# Patient Record
Sex: Male | Born: 1955 | Race: Black or African American | Hispanic: No | Marital: Single | Smoking: Former smoker
Health system: Southern US, Community
[De-identification: ages and names within clinical notes are randomized; demographics above are authoritative.]

## PROBLEM LIST (undated history)

## (undated) DIAGNOSIS — I10 Essential (primary) hypertension: Secondary | ICD-10-CM

## (undated) DIAGNOSIS — K219 Gastro-esophageal reflux disease without esophagitis: Secondary | ICD-10-CM

## (undated) DIAGNOSIS — I82409 Acute embolism and thrombosis of unspecified deep veins of unspecified lower extremity: Secondary | ICD-10-CM

## (undated) DIAGNOSIS — R6889 Other general symptoms and signs: Secondary | ICD-10-CM

## (undated) DIAGNOSIS — L0390 Cellulitis, unspecified: Secondary | ICD-10-CM

## (undated) DIAGNOSIS — I770 Arteriovenous fistula, acquired: Secondary | ICD-10-CM

## (undated) DIAGNOSIS — E785 Hyperlipidemia, unspecified: Secondary | ICD-10-CM

## (undated) DIAGNOSIS — I619 Nontraumatic intracerebral hemorrhage, unspecified: Secondary | ICD-10-CM

## (undated) DIAGNOSIS — N289 Disorder of kidney and ureter, unspecified: Secondary | ICD-10-CM

## (undated) DIAGNOSIS — Z940 Kidney transplant status: Secondary | ICD-10-CM

## (undated) DIAGNOSIS — G5600 Carpal tunnel syndrome, unspecified upper limb: Secondary | ICD-10-CM

## (undated) DIAGNOSIS — M109 Gout, unspecified: Secondary | ICD-10-CM

## (undated) DIAGNOSIS — F482 Pseudobulbar affect: Secondary | ICD-10-CM

## (undated) DIAGNOSIS — A419 Sepsis, unspecified organism: Secondary | ICD-10-CM

## (undated) DIAGNOSIS — I629 Nontraumatic intracranial hemorrhage, unspecified: Secondary | ICD-10-CM

## (undated) DIAGNOSIS — T801XXA Vascular complications following infusion, transfusion and therapeutic injection, initial encounter: Secondary | ICD-10-CM

## (undated) DIAGNOSIS — I739 Peripheral vascular disease, unspecified: Secondary | ICD-10-CM

## (undated) DIAGNOSIS — D649 Anemia, unspecified: Secondary | ICD-10-CM

## (undated) DIAGNOSIS — R7611 Nonspecific reaction to tuberculin skin test without active tuberculosis: Secondary | ICD-10-CM

## (undated) DIAGNOSIS — E875 Hyperkalemia: Secondary | ICD-10-CM

## (undated) DIAGNOSIS — K922 Gastrointestinal hemorrhage, unspecified: Secondary | ICD-10-CM

## (undated) DIAGNOSIS — E119 Type 2 diabetes mellitus without complications: Secondary | ICD-10-CM

## (undated) HISTORY — DX: Peripheral vascular disease, unspecified: I73.9

## (undated) HISTORY — DX: Type 2 diabetes mellitus without complications: E11.9

## (undated) HISTORY — DX: Gastrointestinal hemorrhage, unspecified: K92.2

## (undated) HISTORY — DX: Anemia, unspecified: D64.9

## (undated) HISTORY — PX: BRAIN BIOPSY: SHX905

## (undated) HISTORY — DX: Arteriovenous fistula, acquired: I77.0

## (undated) HISTORY — DX: Cellulitis, unspecified: L03.90

## (undated) HISTORY — DX: Vascular complications following infusion, transfusion and therapeutic injection, initial encounter: T80.1XXA

## (undated) HISTORY — DX: Disorder of kidney and ureter, unspecified: N28.9

## (undated) HISTORY — DX: Sepsis, unspecified organism: A41.9

## (undated) HISTORY — DX: Nontraumatic intracerebral hemorrhage, unspecified: I61.9

## (undated) HISTORY — PX: KIDNEY TRANSPLANT: SHX239

## (undated) HISTORY — DX: Nonspecific reaction to tuberculin skin test without active tuberculosis: R76.11

## (undated) HISTORY — DX: Other general symptoms and signs: R68.89

## (undated) HISTORY — DX: Acute embolism and thrombosis of unspecified deep veins of unspecified lower extremity: I82.409

## (undated) HISTORY — DX: Essential (primary) hypertension: I10

## (undated) HISTORY — PX: GASTROSTOMY: SHX151

## (undated) HISTORY — PX: OTHER SURGICAL HISTORY: SHX169

## (undated) HISTORY — PX: PERIPHERALLY INSERTED CENTRAL CATHETER INSERTION: SHX2221

## (undated) HISTORY — DX: Carpal tunnel syndrome, unspecified upper limb: G56.00

## (undated) HISTORY — DX: Nontraumatic intracranial hemorrhage, unspecified: I62.9

## (undated) HISTORY — PX: AMPUTATION: SHX166

## (undated) HISTORY — DX: Hyperkalemia: E87.5

## (undated) HISTORY — DX: Hyperlipidemia, unspecified: E78.5

## (undated) HISTORY — PX: AV FISTULA PLACEMENT: SHX1204

---

## 1998-01-30 ENCOUNTER — Ambulatory Visit (HOSPITAL_COMMUNITY): Admission: RE | Admit: 1998-01-30 | Discharge: 1998-01-30 | Payer: Self-pay | Admitting: Vascular Surgery

## 1998-01-31 ENCOUNTER — Inpatient Hospital Stay (HOSPITAL_COMMUNITY): Admission: EM | Admit: 1998-01-31 | Discharge: 1998-01-31 | Payer: Self-pay | Admitting: Emergency Medicine

## 1998-02-09 ENCOUNTER — Ambulatory Visit (HOSPITAL_COMMUNITY): Admission: RE | Admit: 1998-02-09 | Discharge: 1998-02-09 | Payer: Self-pay | Admitting: *Deleted

## 1998-05-13 ENCOUNTER — Ambulatory Visit (HOSPITAL_COMMUNITY): Admission: RE | Admit: 1998-05-13 | Discharge: 1998-05-13 | Payer: Self-pay | Admitting: *Deleted

## 1998-05-22 ENCOUNTER — Ambulatory Visit: Admission: RE | Admit: 1998-05-22 | Discharge: 1998-05-22 | Payer: Self-pay | Admitting: Vascular Surgery

## 1998-08-12 ENCOUNTER — Ambulatory Visit (HOSPITAL_BASED_OUTPATIENT_CLINIC_OR_DEPARTMENT_OTHER): Admission: RE | Admit: 1998-08-12 | Discharge: 1998-08-12 | Payer: Self-pay | Admitting: Orthopedic Surgery

## 1998-08-28 ENCOUNTER — Ambulatory Visit (HOSPITAL_COMMUNITY): Admission: RE | Admit: 1998-08-28 | Discharge: 1998-08-28 | Payer: Self-pay | Admitting: Nephrology

## 1998-08-28 ENCOUNTER — Encounter: Payer: Self-pay | Admitting: Nephrology

## 1998-11-12 ENCOUNTER — Encounter: Payer: Self-pay | Admitting: Nephrology

## 1998-11-12 ENCOUNTER — Ambulatory Visit (HOSPITAL_COMMUNITY): Admission: RE | Admit: 1998-11-12 | Discharge: 1998-11-12 | Payer: Self-pay | Admitting: Nephrology

## 1998-12-02 ENCOUNTER — Ambulatory Visit (HOSPITAL_COMMUNITY): Admission: RE | Admit: 1998-12-02 | Discharge: 1998-12-02 | Payer: Self-pay | Admitting: Cardiovascular Disease

## 1999-03-29 ENCOUNTER — Ambulatory Visit (HOSPITAL_COMMUNITY): Admission: RE | Admit: 1999-03-29 | Discharge: 1999-03-29 | Payer: Self-pay | Admitting: Infectious Diseases

## 1999-03-29 ENCOUNTER — Encounter: Payer: Self-pay | Admitting: Infectious Diseases

## 1999-05-09 ENCOUNTER — Inpatient Hospital Stay (HOSPITAL_COMMUNITY): Admission: EM | Admit: 1999-05-09 | Discharge: 1999-05-09 | Payer: Self-pay | Admitting: Nephrology

## 1999-05-27 ENCOUNTER — Encounter: Payer: Self-pay | Admitting: Vascular Surgery

## 1999-05-27 ENCOUNTER — Ambulatory Visit (HOSPITAL_COMMUNITY): Admission: RE | Admit: 1999-05-27 | Discharge: 1999-05-27 | Payer: Self-pay | Admitting: Vascular Surgery

## 1999-07-23 ENCOUNTER — Emergency Department (HOSPITAL_COMMUNITY): Admission: EM | Admit: 1999-07-23 | Discharge: 1999-07-23 | Payer: Self-pay | Admitting: Emergency Medicine

## 1999-07-23 ENCOUNTER — Encounter: Payer: Self-pay | Admitting: Emergency Medicine

## 1999-10-27 ENCOUNTER — Encounter: Admission: RE | Admit: 1999-10-27 | Discharge: 2000-01-25 | Payer: Self-pay | Admitting: Internal Medicine

## 2000-02-23 ENCOUNTER — Encounter: Admission: RE | Admit: 2000-02-23 | Discharge: 2000-05-23 | Payer: Self-pay | Admitting: Orthopedic Surgery

## 2000-05-29 ENCOUNTER — Encounter: Admission: RE | Admit: 2000-05-29 | Discharge: 2000-05-29 | Payer: Self-pay | Admitting: *Deleted

## 2000-05-29 ENCOUNTER — Encounter: Payer: Self-pay | Admitting: *Deleted

## 2001-08-12 ENCOUNTER — Encounter: Payer: Self-pay | Admitting: Nephrology

## 2001-08-12 ENCOUNTER — Inpatient Hospital Stay (HOSPITAL_COMMUNITY): Admission: EM | Admit: 2001-08-12 | Discharge: 2001-08-13 | Payer: Self-pay | Admitting: Emergency Medicine

## 2001-08-13 ENCOUNTER — Encounter: Payer: Self-pay | Admitting: Nephrology

## 2001-12-14 ENCOUNTER — Encounter: Payer: Self-pay | Admitting: Nephrology

## 2001-12-14 ENCOUNTER — Encounter: Admission: RE | Admit: 2001-12-14 | Discharge: 2001-12-14 | Payer: Self-pay | Admitting: Nephrology

## 2002-03-20 ENCOUNTER — Ambulatory Visit (HOSPITAL_COMMUNITY): Admission: RE | Admit: 2002-03-20 | Discharge: 2002-03-20 | Payer: Self-pay | Admitting: Nephrology

## 2002-03-20 ENCOUNTER — Encounter: Payer: Self-pay | Admitting: Nephrology

## 2003-02-12 ENCOUNTER — Encounter: Payer: Self-pay | Admitting: Nephrology

## 2003-02-12 ENCOUNTER — Ambulatory Visit (HOSPITAL_COMMUNITY): Admission: RE | Admit: 2003-02-12 | Discharge: 2003-02-12 | Payer: Self-pay | Admitting: Nephrology

## 2004-01-15 ENCOUNTER — Ambulatory Visit (HOSPITAL_COMMUNITY): Admission: RE | Admit: 2004-01-15 | Discharge: 2004-01-15 | Payer: Self-pay | Admitting: Vascular Surgery

## 2004-02-12 ENCOUNTER — Encounter (HOSPITAL_BASED_OUTPATIENT_CLINIC_OR_DEPARTMENT_OTHER): Admission: RE | Admit: 2004-02-12 | Discharge: 2004-05-12 | Payer: Self-pay | Admitting: Internal Medicine

## 2004-04-05 ENCOUNTER — Ambulatory Visit: Payer: Self-pay | Admitting: *Deleted

## 2004-04-16 ENCOUNTER — Ambulatory Visit (HOSPITAL_COMMUNITY): Admission: RE | Admit: 2004-04-16 | Discharge: 2004-04-16 | Payer: Self-pay | Admitting: Nephrology

## 2004-05-19 ENCOUNTER — Encounter (HOSPITAL_BASED_OUTPATIENT_CLINIC_OR_DEPARTMENT_OTHER): Admission: RE | Admit: 2004-05-19 | Discharge: 2004-08-09 | Payer: Self-pay | Admitting: Internal Medicine

## 2004-06-11 ENCOUNTER — Ambulatory Visit (HOSPITAL_COMMUNITY): Admission: RE | Admit: 2004-06-11 | Discharge: 2004-06-11 | Payer: Self-pay | Admitting: *Deleted

## 2004-06-23 ENCOUNTER — Ambulatory Visit (HOSPITAL_COMMUNITY): Admission: RE | Admit: 2004-06-23 | Discharge: 2004-06-23 | Payer: Self-pay | Admitting: *Deleted

## 2004-08-04 ENCOUNTER — Ambulatory Visit (HOSPITAL_COMMUNITY): Admission: RE | Admit: 2004-08-04 | Discharge: 2004-08-04 | Payer: Self-pay | Admitting: *Deleted

## 2004-08-24 ENCOUNTER — Encounter (HOSPITAL_BASED_OUTPATIENT_CLINIC_OR_DEPARTMENT_OTHER): Admission: RE | Admit: 2004-08-24 | Discharge: 2004-09-22 | Payer: Self-pay | Admitting: Internal Medicine

## 2004-09-06 ENCOUNTER — Ambulatory Visit (HOSPITAL_COMMUNITY): Admission: RE | Admit: 2004-09-06 | Discharge: 2004-09-06 | Payer: Self-pay | Admitting: Nephrology

## 2004-10-29 ENCOUNTER — Encounter (HOSPITAL_BASED_OUTPATIENT_CLINIC_OR_DEPARTMENT_OTHER): Admission: RE | Admit: 2004-10-29 | Discharge: 2005-01-27 | Payer: Self-pay | Admitting: Surgery

## 2005-01-19 ENCOUNTER — Ambulatory Visit (HOSPITAL_COMMUNITY): Admission: RE | Admit: 2005-01-19 | Discharge: 2005-01-19 | Payer: Self-pay | Admitting: Nephrology

## 2005-02-07 ENCOUNTER — Encounter (HOSPITAL_BASED_OUTPATIENT_CLINIC_OR_DEPARTMENT_OTHER): Admission: RE | Admit: 2005-02-07 | Discharge: 2005-05-08 | Payer: Self-pay | Admitting: Surgery

## 2005-05-20 ENCOUNTER — Encounter (HOSPITAL_BASED_OUTPATIENT_CLINIC_OR_DEPARTMENT_OTHER): Admission: RE | Admit: 2005-05-20 | Discharge: 2005-08-18 | Payer: Self-pay | Admitting: Surgery

## 2005-08-29 ENCOUNTER — Encounter (HOSPITAL_BASED_OUTPATIENT_CLINIC_OR_DEPARTMENT_OTHER): Admission: RE | Admit: 2005-08-29 | Discharge: 2005-09-01 | Payer: Self-pay | Admitting: Surgery

## 2005-09-05 ENCOUNTER — Ambulatory Visit: Payer: Self-pay | Admitting: Gastroenterology

## 2005-09-12 ENCOUNTER — Ambulatory Visit: Payer: Self-pay | Admitting: Gastroenterology

## 2005-09-16 ENCOUNTER — Ambulatory Visit (HOSPITAL_COMMUNITY): Admission: RE | Admit: 2005-09-16 | Discharge: 2005-09-16 | Payer: Self-pay | Admitting: Nephrology

## 2006-01-21 ENCOUNTER — Ambulatory Visit (HOSPITAL_COMMUNITY): Admission: RE | Admit: 2006-01-21 | Discharge: 2006-01-21 | Payer: Self-pay | Admitting: Vascular Surgery

## 2006-01-27 ENCOUNTER — Ambulatory Visit (HOSPITAL_COMMUNITY): Admission: RE | Admit: 2006-01-27 | Discharge: 2006-01-27 | Payer: Self-pay | Admitting: Vascular Surgery

## 2006-02-17 ENCOUNTER — Ambulatory Visit: Admission: RE | Admit: 2006-02-17 | Discharge: 2006-02-17 | Payer: Self-pay | Admitting: Vascular Surgery

## 2006-03-24 ENCOUNTER — Encounter (HOSPITAL_BASED_OUTPATIENT_CLINIC_OR_DEPARTMENT_OTHER): Admission: RE | Admit: 2006-03-24 | Discharge: 2006-06-22 | Payer: Self-pay | Admitting: Surgery

## 2006-03-27 ENCOUNTER — Ambulatory Visit (HOSPITAL_COMMUNITY): Admission: RE | Admit: 2006-03-27 | Discharge: 2006-03-27 | Payer: Self-pay | Admitting: Vascular Surgery

## 2006-04-03 ENCOUNTER — Encounter (HOSPITAL_BASED_OUTPATIENT_CLINIC_OR_DEPARTMENT_OTHER): Admission: RE | Admit: 2006-04-03 | Discharge: 2006-06-22 | Payer: Self-pay | Admitting: Surgery

## 2006-04-26 ENCOUNTER — Ambulatory Visit (HOSPITAL_COMMUNITY): Admission: RE | Admit: 2006-04-26 | Discharge: 2006-04-26 | Payer: Self-pay | Admitting: Nephrology

## 2006-05-11 ENCOUNTER — Ambulatory Visit (HOSPITAL_COMMUNITY): Admission: RE | Admit: 2006-05-11 | Discharge: 2006-05-11 | Payer: Self-pay | Admitting: Vascular Surgery

## 2006-05-15 ENCOUNTER — Encounter: Admission: RE | Admit: 2006-05-15 | Discharge: 2006-05-15 | Payer: Self-pay | Admitting: Nephrology

## 2006-05-15 ENCOUNTER — Ambulatory Visit (HOSPITAL_COMMUNITY): Admission: RE | Admit: 2006-05-15 | Discharge: 2006-05-15 | Payer: Self-pay | Admitting: Nephrology

## 2006-05-16 ENCOUNTER — Ambulatory Visit (HOSPITAL_COMMUNITY): Admission: RE | Admit: 2006-05-16 | Discharge: 2006-05-16 | Payer: Self-pay | Admitting: Vascular Surgery

## 2006-06-05 ENCOUNTER — Ambulatory Visit (HOSPITAL_COMMUNITY): Admission: RE | Admit: 2006-06-05 | Discharge: 2006-06-05 | Payer: Self-pay | Admitting: Vascular Surgery

## 2006-06-23 ENCOUNTER — Inpatient Hospital Stay (HOSPITAL_COMMUNITY): Admission: EM | Admit: 2006-06-23 | Discharge: 2006-06-26 | Payer: Self-pay | Admitting: Emergency Medicine

## 2006-06-23 ENCOUNTER — Ambulatory Visit: Payer: Self-pay | Admitting: Cardiology

## 2006-07-05 ENCOUNTER — Ambulatory Visit (HOSPITAL_COMMUNITY): Admission: RE | Admit: 2006-07-05 | Discharge: 2006-07-05 | Payer: Self-pay | Admitting: Surgery

## 2006-07-05 ENCOUNTER — Encounter (HOSPITAL_BASED_OUTPATIENT_CLINIC_OR_DEPARTMENT_OTHER): Admission: RE | Admit: 2006-07-05 | Discharge: 2006-08-10 | Payer: Self-pay | Admitting: Surgery

## 2006-07-10 ENCOUNTER — Ambulatory Visit (HOSPITAL_COMMUNITY): Admission: RE | Admit: 2006-07-10 | Discharge: 2006-07-10 | Payer: Self-pay | Admitting: Vascular Surgery

## 2006-07-31 ENCOUNTER — Ambulatory Visit: Payer: Self-pay | Admitting: Gastroenterology

## 2006-08-14 ENCOUNTER — Ambulatory Visit: Payer: Self-pay | Admitting: Physician Assistant

## 2006-08-14 ENCOUNTER — Ambulatory Visit (HOSPITAL_COMMUNITY): Admission: RE | Admit: 2006-08-14 | Discharge: 2006-08-14 | Payer: Self-pay | Admitting: Vascular Surgery

## 2006-09-20 ENCOUNTER — Encounter: Payer: Self-pay | Admitting: Vascular Surgery

## 2006-09-20 ENCOUNTER — Ambulatory Visit (HOSPITAL_COMMUNITY): Admission: RE | Admit: 2006-09-20 | Discharge: 2006-09-20 | Payer: Self-pay | Admitting: Nephrology

## 2006-10-25 ENCOUNTER — Ambulatory Visit: Payer: Self-pay | Admitting: Vascular Surgery

## 2007-02-06 ENCOUNTER — Encounter (HOSPITAL_BASED_OUTPATIENT_CLINIC_OR_DEPARTMENT_OTHER): Admission: RE | Admit: 2007-02-06 | Discharge: 2007-03-27 | Payer: Self-pay | Admitting: Surgery

## 2007-05-01 ENCOUNTER — Encounter (HOSPITAL_BASED_OUTPATIENT_CLINIC_OR_DEPARTMENT_OTHER): Admission: RE | Admit: 2007-05-01 | Discharge: 2007-05-21 | Payer: Self-pay | Admitting: Surgery

## 2007-06-14 ENCOUNTER — Inpatient Hospital Stay (HOSPITAL_COMMUNITY): Admission: EM | Admit: 2007-06-14 | Discharge: 2007-06-15 | Payer: Self-pay | Admitting: Emergency Medicine

## 2007-06-14 ENCOUNTER — Ambulatory Visit: Payer: Self-pay | Admitting: Internal Medicine

## 2007-06-15 ENCOUNTER — Encounter (INDEPENDENT_AMBULATORY_CARE_PROVIDER_SITE_OTHER): Payer: Self-pay | Admitting: Internal Medicine

## 2007-08-21 ENCOUNTER — Ambulatory Visit: Payer: Self-pay | Admitting: Pulmonary Disease

## 2007-08-21 ENCOUNTER — Inpatient Hospital Stay (HOSPITAL_COMMUNITY): Admission: EM | Admit: 2007-08-21 | Discharge: 2007-09-06 | Payer: Self-pay | Admitting: Emergency Medicine

## 2007-08-22 ENCOUNTER — Encounter: Payer: Self-pay | Admitting: Neurosurgery

## 2007-08-27 ENCOUNTER — Ambulatory Visit: Payer: Self-pay | Admitting: Infectious Disease

## 2007-08-28 ENCOUNTER — Encounter: Payer: Self-pay | Admitting: Infectious Disease

## 2007-09-05 ENCOUNTER — Encounter: Payer: Self-pay | Admitting: Pulmonary Disease

## 2007-09-19 ENCOUNTER — Emergency Department (HOSPITAL_COMMUNITY): Admission: EM | Admit: 2007-09-19 | Discharge: 2007-09-19 | Payer: Self-pay | Admitting: Emergency Medicine

## 2007-10-09 ENCOUNTER — Emergency Department (HOSPITAL_COMMUNITY): Admission: EM | Admit: 2007-10-09 | Discharge: 2007-10-09 | Payer: Self-pay | Admitting: Emergency Medicine

## 2008-09-03 ENCOUNTER — Encounter (HOSPITAL_BASED_OUTPATIENT_CLINIC_OR_DEPARTMENT_OTHER): Payer: Self-pay | Admitting: Internal Medicine

## 2008-11-07 ENCOUNTER — Encounter: Admission: RE | Admit: 2008-11-07 | Discharge: 2008-11-07 | Payer: Self-pay | Admitting: Internal Medicine

## 2008-12-14 ENCOUNTER — Emergency Department (HOSPITAL_COMMUNITY): Admission: EM | Admit: 2008-12-14 | Discharge: 2008-12-15 | Payer: Self-pay | Admitting: Emergency Medicine

## 2009-06-11 ENCOUNTER — Ambulatory Visit (HOSPITAL_COMMUNITY): Admission: RE | Admit: 2009-06-11 | Discharge: 2009-06-11 | Payer: Self-pay | Admitting: Internal Medicine

## 2009-06-12 ENCOUNTER — Emergency Department (HOSPITAL_COMMUNITY): Admission: EM | Admit: 2009-06-12 | Discharge: 2009-06-12 | Payer: Self-pay | Admitting: Emergency Medicine

## 2010-02-13 ENCOUNTER — Emergency Department (HOSPITAL_COMMUNITY): Admission: EM | Admit: 2010-02-13 | Discharge: 2010-02-14 | Payer: Self-pay | Admitting: Emergency Medicine

## 2010-03-31 ENCOUNTER — Ambulatory Visit (HOSPITAL_COMMUNITY): Admission: RE | Admit: 2010-03-31 | Discharge: 2010-03-31 | Payer: Self-pay | Admitting: Orthopedic Surgery

## 2010-04-15 ENCOUNTER — Ambulatory Visit: Payer: Self-pay | Admitting: Cardiology

## 2010-04-15 ENCOUNTER — Inpatient Hospital Stay (HOSPITAL_COMMUNITY): Admission: EM | Admit: 2010-04-15 | Discharge: 2010-04-27 | Payer: Self-pay | Admitting: Emergency Medicine

## 2010-04-16 ENCOUNTER — Ambulatory Visit: Payer: Self-pay | Admitting: Vascular Surgery

## 2010-04-16 ENCOUNTER — Encounter (INDEPENDENT_AMBULATORY_CARE_PROVIDER_SITE_OTHER): Payer: Self-pay | Admitting: Internal Medicine

## 2010-04-24 ENCOUNTER — Encounter (INDEPENDENT_AMBULATORY_CARE_PROVIDER_SITE_OTHER): Payer: Self-pay | Admitting: Internal Medicine

## 2010-04-25 ENCOUNTER — Encounter (INDEPENDENT_AMBULATORY_CARE_PROVIDER_SITE_OTHER): Payer: Self-pay | Admitting: Internal Medicine

## 2010-04-28 ENCOUNTER — Encounter: Payer: Self-pay | Admitting: Cardiovascular Disease

## 2010-04-30 ENCOUNTER — Encounter: Payer: Self-pay | Admitting: Cardiovascular Disease

## 2010-05-03 DIAGNOSIS — I82409 Acute embolism and thrombosis of unspecified deep veins of unspecified lower extremity: Secondary | ICD-10-CM | POA: Insufficient documentation

## 2010-05-03 DIAGNOSIS — I629 Nontraumatic intracranial hemorrhage, unspecified: Secondary | ICD-10-CM

## 2010-05-03 DIAGNOSIS — Z8669 Personal history of other diseases of the nervous system and sense organs: Secondary | ICD-10-CM

## 2010-05-03 DIAGNOSIS — L0390 Cellulitis, unspecified: Secondary | ICD-10-CM

## 2010-05-03 DIAGNOSIS — I7789 Other specified disorders of arteries and arterioles: Secondary | ICD-10-CM

## 2010-05-03 DIAGNOSIS — E875 Hyperkalemia: Secondary | ICD-10-CM

## 2010-05-03 DIAGNOSIS — L0291 Cutaneous abscess, unspecified: Secondary | ICD-10-CM

## 2010-05-03 DIAGNOSIS — N289 Disorder of kidney and ureter, unspecified: Secondary | ICD-10-CM | POA: Insufficient documentation

## 2010-05-03 DIAGNOSIS — E119 Type 2 diabetes mellitus without complications: Secondary | ICD-10-CM

## 2010-05-03 DIAGNOSIS — I10 Essential (primary) hypertension: Secondary | ICD-10-CM

## 2010-05-03 DIAGNOSIS — K922 Gastrointestinal hemorrhage, unspecified: Secondary | ICD-10-CM | POA: Insufficient documentation

## 2010-05-03 DIAGNOSIS — Z8639 Personal history of other endocrine, nutritional and metabolic disease: Secondary | ICD-10-CM

## 2010-05-03 DIAGNOSIS — E785 Hyperlipidemia, unspecified: Secondary | ICD-10-CM

## 2010-05-03 DIAGNOSIS — K649 Unspecified hemorrhoids: Secondary | ICD-10-CM | POA: Insufficient documentation

## 2010-05-03 DIAGNOSIS — Z862 Personal history of diseases of the blood and blood-forming organs and certain disorders involving the immune mechanism: Secondary | ICD-10-CM | POA: Insufficient documentation

## 2010-05-03 DIAGNOSIS — A419 Sepsis, unspecified organism: Secondary | ICD-10-CM | POA: Insufficient documentation

## 2010-05-03 DIAGNOSIS — I959 Hypotension, unspecified: Secondary | ICD-10-CM | POA: Insufficient documentation

## 2010-05-03 DIAGNOSIS — I739 Peripheral vascular disease, unspecified: Secondary | ICD-10-CM

## 2010-05-03 DIAGNOSIS — D649 Anemia, unspecified: Secondary | ICD-10-CM | POA: Insufficient documentation

## 2010-05-28 ENCOUNTER — Encounter (INDEPENDENT_AMBULATORY_CARE_PROVIDER_SITE_OTHER): Payer: Self-pay | Admitting: *Deleted

## 2010-06-23 ENCOUNTER — Ambulatory Visit: Payer: Self-pay | Admitting: Cardiovascular Disease

## 2010-07-14 DIAGNOSIS — I43 Cardiomyopathy in diseases classified elsewhere: Secondary | ICD-10-CM | POA: Insufficient documentation

## 2010-07-24 ENCOUNTER — Encounter: Payer: Self-pay | Admitting: Nephrology

## 2010-07-25 ENCOUNTER — Encounter (HOSPITAL_BASED_OUTPATIENT_CLINIC_OR_DEPARTMENT_OTHER): Payer: Self-pay | Admitting: Internal Medicine

## 2010-07-25 ENCOUNTER — Encounter: Payer: Self-pay | Admitting: Nephrology

## 2010-08-03 NOTE — Letter (Signed)
Summary: Appointment - Missed  [facility], Main Office  [address]   [place], [place] [address]   Phone: [phone]  Fax: [phone]        May 28, 2010 MRN: [id number]      [facility] [address] [place], [place]  [address]    Dear Mr. Gwynn,  Our records indicate you missed your appointment on May 14, 2010 with Dr. Eden Emms.  It is very important that we reach you to reschedule this appointment. We look forward to participating in your health care needs. Please contact us at the number listed above at your earliest convenience to reschedule this appointment.     Sincerely,   Glass blower/designer

## 2010-08-03 NOTE — Letter (Signed)
Summary: [employer]   Imported By: Marylou Mccoy 05/13/2010 16:02:00  _____________________________________________________________________  External Attachment:    Type:   Image     Comment:   External Document

## 2010-08-05 NOTE — Assessment & Plan Note (Signed)
Summary: NP3/INFERIOR WALL HYPOKINESIS/MT  Medications Added AMLODIPINE BESYLATE 10 MG TABS (AMLODIPINE BESYLATE) Take one tablet by mouth daily TACROLIMUS 1 MG CAPS (TACROLIMUS) 3 tabs every 12 hrs alon with 0.5 ALLOPURINOL 100 MG TABS (ALLOPURINOL) 1 tab by mouth once daily PREDNISONE 5 MG TABS (PREDNISONE) 1 tab by mouth once daily LEVETIRACETAM 500 MG TABS (LEVETIRACETAM) 1 tab by mouth every 12 hrs OMEPRAZOLE 20 MG CPDR (OMEPRAZOLE) 1 tab by mouth once daily NOVOLIN N 100 UNIT/ML SUSP (INSULIN ISOPHANE HUMAN) 7 units three times a day with meals LANTUS 100 UNIT/ML SOLN (INSULIN GLARGINE) 22 units at bedtime * POLYETHYLENE as directed ASPIRIN 81 MG TBEC (ASPIRIN) Take one tablet by mouth daily HYDROCODONE-ACETAMINOPHEN 5-500 MG TABS (HYDROCODONE-ACETAMINOPHEN) as needed * DOCU 100MG  1 tab for constipation * PHOS- NAK mix 1 packet in water      Allergies Added: NKDA  History of Present Illness: 55 yo referred by primary at assisted living.  Previously seen by Dr Andee Lineman and Samule Ohm.  Recent hospitalization for sepsis with PICC line removal.  Had CNS hemorage in 2007 after procedure/biopsy.  Advanced dementia with left hemiparesis.  Only verbal response is "I'm fine"  Discussed with daughter she is concerned about swelling in LUE.  Notes from assisted living indicate ? CAD with EF 50% 10/11 on echo.  Reviewed and no discrete RWMA.  Has had history of DCM 8 plus years ago with EF as low as 30% Had issues with alcohol and drug abuse then.  No active SSCP, chest pain palpitations. Reviewed cath from 2007 and no CAD.  Sepsis seems to have cleared.  Elevated LFT's in hospital are resolving.  Has dependant edema in LUE from nonuse and dependancy.    He is not a candidate for agressive cardiac w/u.  Current Problems (verified): 1)  Hypertension  (ICD-401.9) 2)  Dyslipidemia  (ICD-272.4) 3)  Carpal Tunnel Syndrome, Hx of  (ICD-V12.49) 4)  Av Fistula  (ICD-447.8) 5)  Anemia  (ICD-285.9) 6)   Hemorrhoids  (ICD-455.6) 7)  Hyperparathyroidism, Hx of  (ICD-V12.2) 8)  Intracranial Hemorrhage  (ICD-432.9) 9)  Hypotension  (ICD-458.9) 10)  Hyperkalemia  (ICD-276.7) 11)  Kidney Disease  (ICD-593.9) 12)  Sepsis  (ICD-038.9) 13)  Gi Bleeding  (ICD-578.9) 14)  Dvt  (ICD-453.40) 15)  Pvd  (ICD-443.9) 16)  Dm  (ICD-250.00) 17)  Cellulitis  (ICD-682.9)  Current Medications (verified): 1)  Amlodipine Besylate 10 Mg Tabs (Amlodipine Besylate) .... Take One Tablet By Mouth Daily 2)  Tacrolimus 1 Mg Caps (Tacrolimus) .... 3 Tabs Every 12 Hrs Alon With 0.5 3)  Allopurinol 100 Mg Tabs (Allopurinol) .Marland Kitchen.. 1 Tab By Mouth Once Daily 4)  Prednisone 5 Mg Tabs (Prednisone) .Marland Kitchen.. 1 Tab By Mouth Once Daily 5)  Levetiracetam 500 Mg Tabs (Levetiracetam) .Marland Kitchen.. 1 Tab By Mouth Every 12 Hrs 6)  Omeprazole 20 Mg Cpdr (Omeprazole) .Marland Kitchen.. 1 Tab By Mouth Once Daily 7)  Novolin N 100 Unit/ml Susp (Insulin Isophane Human) .... 7 Units Three Times A Day With Meals 8)  Lantus 100 Unit/ml Soln (Insulin Glargine) .... 22 Units At Bedtime 9)  Polyethylene .... As Directed 10)  Aspirin 81 Mg Tbec (Aspirin) .... Take One Tablet By Mouth Daily 11)  Hydrocodone-Acetaminophen 5-500 Mg Tabs (Hydrocodone-Acetaminophen) .... As Needed 12)  Docu 100mg  .... 1 Tab For Constipation 13)  Phos- Nak .... Mix 1 Packet in Water  Allergies (verified): No Known Drug Allergies  Past History:  Past Medical History: Last updated: 05/03/2010 Ejection fraction of 60% with  mild anterior hypokinesis, minimal  coronary artery disease on catheterization in December of 2007.  HYPERTENSION DYSLIPIDEMIA CARPAL TUNNEL SYNDROME, HX OF  AV FISTULA ANEMIA  HEMORRHOIDS HYPERPARATHYROIDISM, HX OF  INTRACRANIAL HEMORRHAGE  HYPOTENSION HYPERKALEMIA KIDNEY DISEASE  SEPSIS  GI BLEEDING  DVT PVD DM CELLULITIS   Right upper extremity IV infiltration.   History of intracerebral bleed, status post brain biopsy and right residual hemiparesis.     treated for positive PPD  Past Surgical History: Last updated: 05/03/2010 History of renal transplant maintained on chronic immunosuppressive  therapy.   History  of right arm AV fistula placement  history of tonsillectomy  history of  PICC line placement  amputation  gastrostomy  renal transplant  IVC  filter  NOTE  - bilateral  5th  toe amputation  old grafts in R FA   brain biopsy  #6 shiley cuff-  less trache,   Family History: Last updated: 05/03/2010  Significant for mother having heart disease.   Social History: Last updated: 05/03/2010  The patient is disabled, presenting living at the   nursing home.  He is a full code.  He has 2 daughters.  Quit smoking   more than 10 years ago.  No recent alcohol abuse.  Has had a previous   history of cocaine abuse.   Review of Systems       Denies fever, malais, weight loss, blurry vision, decreased visual acuity, cough, sputum, SOB, hemoptysis, pleuritic pain, palpitaitons, heartburn, abdominal pain, melena, lower extremity edema, claudication, or rash.   Vital Signs:  Patient profile:   55 year old male Pulse rate:   88 / minute Resp:     14 per minute BP supine:   123 / 87  Physical Exam  General:  Affect appropriate Healthy:  appears stated age HEENT: normal Neck supple with no adenopathy JVP normal no bruits no thyromegaly previous tracheostomy scar Lungs clear with no wheezing and good diaphragmatic motion Heart:  S1/S2 no murmur,rub, gallop or click PMI normal Abdomen: benighn, BS positve, no tenderness, no AAA previous renal transplant no bruit.  No HSM or HJR Distal pulses intact with no bruits Plus one blateral edema Neuro  left hemiparesis with dementia Skin  with multiple failed grafts in LUE from diallysis mild edema in RUE with decreased rigth radial pulse    Impression & Recommendations:  Problem # 1:  HYPERTENSION (ICD-401.9) Well contorlled His updated medication list for this  problem includes:    Amlodipine Besylate 10 Mg Tabs (Amlodipine besylate) .Marland Kitchen... Take one tablet by mouth daily    Aspirin 81 Mg Tbec (Aspirin) .Marland Kitchen... Take one tablet by mouth daily  Problem # 2:  DYSLIPIDEMIA (ICD-272.4) With increased LFTs would not Rx with medicine  Problem # 3:  CARDIOMYOPATHY OTHER DISEASES CLASSIFIED ELSW (ICD-425.8) Presumed nonischemic and related to ETOH/Drugs in past.  Normal cors on cath 2007 EF improved to 50% 10/11 with no clincial CHF No need form further w/u  His updated medication list for this problem includes:    Amlodipine Besylate 10 Mg Tabs (Amlodipine besylate) .Marland Kitchen... Take one tablet by mouth daily    Aspirin 81 Mg Tbec (Aspirin) .Marland Kitchen... Take one tablet by mouth daily  Problem # 4:  INTRACRANIAL HEMORRHAGE (ICD-432.9) Continue assisted living PT/OT Seems to have no issues with diet.  Surveilance UA His updated medication list for this problem includes:    Aspirin 81 Mg Tbec (Aspirin) .Marland Kitchen... Take one tablet by mouth daily  Problem # 5:  KIDNEY DISEASE (ICD-593.9) Previous renal transplant Cr in hospital  Cr 1.2 F/U Dr Caryn Section  Patient Instructions: 1)  Your physician recommends that you schedule a follow-up appointment in: AS NEEDED 2)  Your physician recommends that you continue on your current medications as directed. Please refer to the Current Medication list given to you today.

## 2010-09-15 LAB — COMPREHENSIVE METABOLIC PANEL
ALT: 244 U/L — ABNORMAL HIGH (ref 0–53)
ALT: 424 U/L — ABNORMAL HIGH (ref 0–53)
ALT: 575 U/L — ABNORMAL HIGH (ref 0–53)
AST: 285 U/L — ABNORMAL HIGH (ref 0–37)
AST: 622 U/L — ABNORMAL HIGH (ref 0–37)
Albumin: 2.5 g/dL — ABNORMAL LOW (ref 3.5–5.2)
Albumin: 2.9 g/dL — ABNORMAL LOW (ref 3.5–5.2)
Alkaline Phosphatase: 104 U/L (ref 39–117)
Alkaline Phosphatase: 133 U/L — ABNORMAL HIGH (ref 39–117)
BUN: 15 mg/dL (ref 6–23)
BUN: 19 mg/dL (ref 6–23)
CO2: 27 mEq/L (ref 19–32)
CO2: 27 mEq/L (ref 19–32)
CO2: 28 mEq/L (ref 19–32)
Calcium: 9.6 mg/dL (ref 8.4–10.5)
Chloride: 105 mEq/L (ref 96–112)
Chloride: 105 mEq/L (ref 96–112)
Chloride: 107 mEq/L (ref 96–112)
Creatinine, Ser: 1.05 mg/dL (ref 0.4–1.5)
Creatinine, Ser: 1.26 mg/dL (ref 0.4–1.5)
GFR calc Af Amer: >60 mL/min (ref >60)
GFR calc Af Amer: >60 mL/min (ref >60)
GFR calc Af Amer: >60 mL/min (ref >60)
GFR calc non Af Amer: >60 mL/min (ref >60)
GFR calc non Af Amer: >60 mL/min (ref >60)
GFR calc non Af Amer: >60 mL/min (ref >60)
Glucose, Bld: 131 mg/dL — ABNORMAL HIGH (ref 70–99)
Glucose, Bld: 139 mg/dL — ABNORMAL HIGH (ref 70–99)
Potassium: 4.3 mEq/L (ref 3.5–5.1)
Potassium: 4.4 mEq/L (ref 3.5–5.1)
Sodium: 138 mEq/L (ref 135–145)
Sodium: 138 mEq/L (ref 135–145)
Total Bilirubin: 0.8 mg/dL (ref 0.3–1.2)
Total Bilirubin: 1.1 mg/dL (ref 0.3–1.2)
Total Bilirubin: 2.3 mg/dL — ABNORMAL HIGH (ref 0.3–1.2)
Total Protein: 6.8 g/dL (ref 6.0–8.3)
Total Protein: 7.1 g/dL (ref 6.0–8.3)

## 2010-09-15 LAB — GLUCOSE, CAPILLARY
Glucose-Capillary: 110 mg/dL — ABNORMAL HIGH (ref 70–99)
Glucose-Capillary: 116 mg/dL — ABNORMAL HIGH (ref 70–99)
Glucose-Capillary: 121 mg/dL — ABNORMAL HIGH (ref 70–99)
Glucose-Capillary: 125 mg/dL — ABNORMAL HIGH (ref 70–99)
Glucose-Capillary: 127 mg/dL — ABNORMAL HIGH (ref 70–99)
Glucose-Capillary: 130 mg/dL — ABNORMAL HIGH (ref 70–99)
Glucose-Capillary: 133 mg/dL — ABNORMAL HIGH (ref 70–99)
Glucose-Capillary: 139 mg/dL — ABNORMAL HIGH (ref 70–99)
Glucose-Capillary: 140 mg/dL — ABNORMAL HIGH (ref 70–99)
Glucose-Capillary: 141 mg/dL — ABNORMAL HIGH (ref 70–99)
Glucose-Capillary: 162 mg/dL — ABNORMAL HIGH (ref 70–99)
Glucose-Capillary: 166 mg/dL — ABNORMAL HIGH (ref 70–99)
Glucose-Capillary: 166 mg/dL — ABNORMAL HIGH (ref 70–99)
Glucose-Capillary: 185 mg/dL — ABNORMAL HIGH (ref 70–99)
Glucose-Capillary: 187 mg/dL — ABNORMAL HIGH (ref 70–99)
Glucose-Capillary: 199 mg/dL — ABNORMAL HIGH (ref 70–99)
Glucose-Capillary: 201 mg/dL — ABNORMAL HIGH (ref 70–99)
Glucose-Capillary: 202 mg/dL — ABNORMAL HIGH (ref 70–99)
Glucose-Capillary: 215 mg/dL — ABNORMAL HIGH (ref 70–99)
Glucose-Capillary: 223 mg/dL — ABNORMAL HIGH (ref 70–99)
Glucose-Capillary: 227 mg/dL — ABNORMAL HIGH (ref 70–99)
Glucose-Capillary: 235 mg/dL — ABNORMAL HIGH (ref 70–99)
Glucose-Capillary: 243 mg/dL — ABNORMAL HIGH (ref 70–99)
Glucose-Capillary: 249 mg/dL — ABNORMAL HIGH (ref 70–99)
Glucose-Capillary: 254 mg/dL — ABNORMAL HIGH (ref 70–99)
Glucose-Capillary: 258 mg/dL — ABNORMAL HIGH (ref 70–99)
Glucose-Capillary: 259 mg/dL — ABNORMAL HIGH (ref 70–99)
Glucose-Capillary: 269 mg/dL — ABNORMAL HIGH (ref 70–99)

## 2010-09-15 LAB — CBC
HCT: 44.9 % (ref 39.0–52.0)
HCT: 45.8 % (ref 39.0–52.0)
HCT: 46.1 % (ref 39.0–52.0)
Hemoglobin: 13.6 g/dL (ref 13.0–17.0)
Hemoglobin: 13.7 g/dL (ref 13.0–17.0)
Hemoglobin: 14.1 g/dL (ref 13.0–17.0)
MCH: 25.4 pg — ABNORMAL LOW (ref 26.0–34.0)
MCHC: 30.8 g/dL (ref 30.0–36.0)
MCHC: 30.8 g/dL (ref 30.0–36.0)
MCV: 82.3 fL (ref 78.0–100.0)
MCV: 83.4 fL (ref 78.0–100.0)
MCV: 84.9 fL (ref 78.0–100.0)
Platelets: 193 10*3/uL (ref 150–400)
Platelets: 210 10*3/uL (ref 150–400)
Platelets: 243 10*3/uL (ref 150–400)
Platelets: 261 10*3/uL (ref 150–400)
RBC: 5.25 MIL/uL (ref 4.22–5.81)
RBC: 5.29 MIL/uL (ref 4.22–5.81)
RBC: 5.35 MIL/uL (ref 4.22–5.81)
RBC: 5.39 MIL/uL (ref 4.22–5.81)
RBC: 5.52 MIL/uL (ref 4.22–5.81)
RDW: 15 % (ref 11.5–15.5)
RDW: 15.3 % (ref 11.5–15.5)
WBC: 3.4 10*3/uL — ABNORMAL LOW (ref 4.0–10.5)
WBC: 5.8 10*3/uL (ref 4.0–10.5)
WBC: 6.3 10*3/uL (ref 4.0–10.5)
WBC: 6.3 10*3/uL (ref 4.0–10.5)

## 2010-09-15 LAB — DIFFERENTIAL
Basophils Absolute: 0 10*3/uL (ref 0.0–0.1)
Basophils Relative: 0 % (ref 0–1)
Lymphocytes Relative: 17 % (ref 12–46)
Monocytes Absolute: 0.5 10*3/uL (ref 0.1–1.0)
Neutro Abs: 4.2 10*3/uL (ref 1.7–7.7)
Neutrophils Relative %: 72 % (ref 43–77)

## 2010-09-15 LAB — BASIC METABOLIC PANEL
BUN: 17 mg/dL (ref 6–23)
BUN: 9 mg/dL (ref 6–23)
CO2: 24 mEq/L (ref 19–32)
CO2: 27 mEq/L (ref 19–32)
Calcium: 9.9 mg/dL (ref 8.4–10.5)
Chloride: 103 mEq/L (ref 96–112)
Chloride: 104 mEq/L (ref 96–112)
Chloride: 106 mEq/L (ref 96–112)
Creatinine, Ser: 1.15 mg/dL (ref 0.4–1.5)
Creatinine, Ser: 1.17 mg/dL (ref 0.4–1.5)
GFR calc Af Amer: >60 mL/min (ref >60)
GFR calc Af Amer: >60 mL/min (ref >60)
GFR calc Af Amer: >60 mL/min (ref >60)
GFR calc non Af Amer: >60 mL/min (ref >60)
GFR calc non Af Amer: >60 mL/min (ref >60)
Glucose, Bld: 203 mg/dL — ABNORMAL HIGH (ref 70–99)
Glucose, Bld: 208 mg/dL — ABNORMAL HIGH (ref 70–99)
Potassium: 4 mEq/L (ref 3.5–5.1)
Potassium: 4 mEq/L (ref 3.5–5.1)
Potassium: 4.1 mEq/L (ref 3.5–5.1)
Sodium: 136 mEq/L (ref 135–145)
Sodium: 137 mEq/L (ref 135–145)

## 2010-09-15 LAB — LIPASE, BLOOD: Lipase: 37 U/L (ref 11–59)

## 2010-09-15 LAB — HEPATIC FUNCTION PANEL
ALT: 311 U/L — ABNORMAL HIGH (ref 0–53)
Alkaline Phosphatase: 136 U/L — ABNORMAL HIGH (ref 39–117)
Bilirubin, Direct: 0.3 mg/dL (ref 0.0–0.3)
Indirect Bilirubin: 0.5 mg/dL (ref 0.3–0.9)
Total Bilirubin: 0.8 mg/dL (ref 0.3–1.2)
Total Protein: 6.5 g/dL (ref 6.0–8.3)

## 2010-09-15 LAB — VANCOMYCIN, TROUGH: Vancomycin Tr: 22.4 ug/mL — ABNORMAL HIGH (ref 10.0–20.0)

## 2010-09-15 LAB — CK: Total CK: 127 U/L (ref 7–232)

## 2010-09-16 LAB — CULTURE, BLOOD (ROUTINE X 2)
Culture  Setup Time: 201110130143
Culture  Setup Time: 201110130519
Culture: NO GROWTH
Culture: NO GROWTH

## 2010-09-16 LAB — CBC
HCT: 47.9 % (ref 39.0–52.0)
HCT: 49.7 % (ref 39.0–52.0)
Hemoglobin: 15.8 g/dL (ref 13.0–17.0)
MCH: 26 pg (ref 26.0–34.0)
MCH: 26.8 pg (ref 26.0–34.0)
MCHC: 30.4 g/dL (ref 30.0–36.0)
MCHC: 30.9 g/dL (ref 30.0–36.0)
MCHC: 31.8 g/dL (ref 30.0–36.0)
MCV: 83.2 fL (ref 78.0–100.0)
MCV: 85.7 fL (ref 78.0–100.0)
Platelets: 134 10*3/uL — ABNORMAL LOW (ref 150–400)
Platelets: 86 10*3/uL — ABNORMAL LOW (ref 150–400)
RBC: 5.26 MIL/uL (ref 4.22–5.81)
RDW: 15 % (ref 11.5–15.5)
RDW: 15.1 % (ref 11.5–15.5)
RDW: 15.2 % (ref 11.5–15.5)
WBC: 8.3 10*3/uL (ref 4.0–10.5)

## 2010-09-16 LAB — CARDIAC PANEL(CRET KIN+CKTOT+MB+TROPI)
CK, MB: 2 ng/mL (ref 0.3–4.0)
Relative Index: 0.5 (ref 0.0–2.5)
Total CK: 196 U/L (ref 7–232)
Total CK: 396 U/L — ABNORMAL HIGH (ref 7–232)
Troponin I: 0.06 ng/mL (ref 0.00–0.06)
Troponin I: 0.1 ng/mL — ABNORMAL HIGH (ref 0.00–0.06)

## 2010-09-16 LAB — GLUCOSE, CAPILLARY
Glucose-Capillary: 133 mg/dL — ABNORMAL HIGH (ref 70–99)
Glucose-Capillary: 140 mg/dL — ABNORMAL HIGH (ref 70–99)
Glucose-Capillary: 149 mg/dL — ABNORMAL HIGH (ref 70–99)
Glucose-Capillary: 150 mg/dL — ABNORMAL HIGH (ref 70–99)
Glucose-Capillary: 165 mg/dL — ABNORMAL HIGH (ref 70–99)
Glucose-Capillary: 172 mg/dL — ABNORMAL HIGH (ref 70–99)
Glucose-Capillary: 272 mg/dL — ABNORMAL HIGH (ref 70–99)

## 2010-09-16 LAB — CK TOTAL AND CKMB (NOT AT ARMC)
CK, MB: 1.9 ng/mL (ref 0.3–4.0)
Total CK: 309 U/L — ABNORMAL HIGH (ref 7–232)

## 2010-09-16 LAB — URINALYSIS, ROUTINE W REFLEX MICROSCOPIC
Ketones, ur: 15 mg/dL — AB
Protein, ur: 100 mg/dL — AB
Urobilinogen, UA: 1 mg/dL (ref 0.0–1.0)

## 2010-09-16 LAB — COMPREHENSIVE METABOLIC PANEL
Albumin: 3 g/dL — ABNORMAL LOW (ref 3.5–5.2)
Alkaline Phosphatase: 70 U/L (ref 39–117)
BUN: 16 mg/dL (ref 6–23)
BUN: 17 mg/dL (ref 6–23)
CO2: 27 mEq/L (ref 19–32)
Calcium: 10 mg/dL (ref 8.4–10.5)
Calcium: 9.5 mg/dL (ref 8.4–10.5)
Creatinine, Ser: 1.46 mg/dL (ref 0.4–1.5)
GFR calc non Af Amer: 50 mL/min — ABNORMAL LOW (ref >60)
Glucose, Bld: 140 mg/dL — ABNORMAL HIGH (ref 70–99)
Glucose, Bld: 143 mg/dL — ABNORMAL HIGH (ref 70–99)
Potassium: 3.9 mEq/L (ref 3.5–5.1)
Sodium: 136 mEq/L (ref 135–145)
Total Protein: 6.9 g/dL (ref 6.0–8.3)
Total Protein: 7.1 g/dL (ref 6.0–8.3)

## 2010-09-16 LAB — DIFFERENTIAL
Basophils Relative: 0 % (ref 0–1)
Eosinophils Relative: 0 % (ref 0–5)
Lymphocytes Relative: 6 % — ABNORMAL LOW (ref 12–46)
Monocytes Absolute: 0.7 10*3/uL (ref 0.1–1.0)
Monocytes Relative: 7 % (ref 3–12)
Neutrophils Relative %: 87 % — ABNORMAL HIGH (ref 43–77)

## 2010-09-16 LAB — BASIC METABOLIC PANEL
Calcium: 9.2 mg/dL (ref 8.4–10.5)
Creatinine, Ser: 1.28 mg/dL (ref 0.4–1.5)
GFR calc Af Amer: >60 mL/min (ref >60)
GFR calc non Af Amer: 58 mL/min — ABNORMAL LOW (ref >60)
Sodium: 133 mEq/L — ABNORMAL LOW (ref 135–145)

## 2010-09-16 LAB — URINE CULTURE
Colony Count: NO GROWTH
Culture  Setup Time: 201110122229

## 2010-09-16 LAB — MAGNESIUM: Magnesium: 1.5 mg/dL (ref 1.5–2.5)

## 2010-09-16 LAB — HEMOCCULT GUIAC POC 1CARD (OFFICE): Fecal Occult Bld: NEGATIVE

## 2010-09-16 LAB — LIPASE, BLOOD: Lipase: 21 U/L (ref 11–59)

## 2010-09-16 LAB — URINE MICROSCOPIC-ADD ON

## 2010-09-16 LAB — PROLACTIN: Prolactin: 4.4 ng/mL (ref 2.1–17.1)

## 2010-10-05 LAB — DIFFERENTIAL
Basophils Relative: 0 % (ref 0–1)
Eosinophils Absolute: 0 10*3/uL (ref 0.0–0.7)
Lymphs Abs: 0.5 10*3/uL — ABNORMAL LOW (ref 0.7–4.0)
Neutro Abs: 3.2 10*3/uL (ref 1.7–7.7)
Neutrophils Relative %: 77 % (ref 43–77)

## 2010-10-05 LAB — CBC
MCHC: 31.2 g/dL (ref 30.0–36.0)
MCV: 83.2 fL (ref 78.0–100.0)
Platelets: 152 10*3/uL (ref 150–400)
WBC: 4.2 10*3/uL (ref 4.0–10.5)

## 2010-10-05 LAB — PROTIME-INR
INR: 1.06 (ref 0.00–1.49)
Prothrombin Time: 13.7 seconds (ref 11.6–15.2)

## 2010-10-05 LAB — APTT: aPTT: 32 seconds (ref 24–37)

## 2010-10-11 LAB — DIFFERENTIAL
Basophils Absolute: 0 10*3/uL (ref 0.0–0.1)
Eosinophils Relative: 0 % (ref 0–5)
Lymphocytes Relative: 4 % — ABNORMAL LOW (ref 12–46)
Monocytes Absolute: 0.8 10*3/uL (ref 0.1–1.0)

## 2010-10-11 LAB — URINALYSIS, ROUTINE W REFLEX MICROSCOPIC
Glucose, UA: NEGATIVE mg/dL
Specific Gravity, Urine: 1.024 (ref 1.005–1.030)

## 2010-10-11 LAB — ABO/RH: ABO/RH(D): O POS

## 2010-10-11 LAB — COMPREHENSIVE METABOLIC PANEL
AST: 34 U/L (ref 0–37)
Albumin: 3.4 g/dL — ABNORMAL LOW (ref 3.5–5.2)
Alkaline Phosphatase: 55 U/L (ref 39–117)
Chloride: 100 mEq/L (ref 96–112)
Creatinine, Ser: 1.45 mg/dL (ref 0.4–1.5)
GFR calc Af Amer: >60 mL/min (ref >60)
Potassium: 4.7 mEq/L (ref 3.5–5.1)
Sodium: 134 mEq/L — ABNORMAL LOW (ref 135–145)
Total Bilirubin: 1.6 mg/dL — ABNORMAL HIGH (ref 0.3–1.2)

## 2010-10-11 LAB — CBC
Platelets: 101 10*3/uL — ABNORMAL LOW (ref 150–400)
WBC: 7.9 10*3/uL (ref 4.0–10.5)

## 2010-10-11 LAB — APTT: aPTT: 26 seconds (ref 24–37)

## 2010-10-11 LAB — URINE MICROSCOPIC-ADD ON

## 2010-11-16 NOTE — Consult Note (Signed)
NAME:  Justin [name], Justin [name]              ACCOUNT NO.:  [id number]   MEDICAL RECORD NO.:  [id number]          PATIENT TYPE:  INP   LOCATION:  3110                         FACILITY:  [facility]   PHYSICIAN:  Wilber Bihari. Caryn Section, M.D.   DATE OF BIRTH:  01-16-1956   DATE OF CONSULTATION:  08/21/2007  DATE OF DISCHARGE:                                 CONSULTATION   Mr. Justin [name] is a 55 year old black man with end-stage renal disease  secondary to diabetes mellitus who had a cadaveric renal transplant at  [facility] in April 2008.  He was treated for rejection in July 2008 with 7  doses of Thymoglobulin as well as Campath and IV Decadron.  Late last  year, he had neurologic symptoms (right weakness, visual disturbances)  and was found to have CNS mass.  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  The biopsy was complicated by  postoperative hemorrhage.  He required ventilation and a tracheostomy.  Oncology was not optimistic of his prognosis and he has been receiving  radiation therapy at [facility] (hospitalized there June 15, 2007-August 16, 2007).  He was recently discharged from there, last week.  Comment  from discharge summary notes that his overall prognosis is quite poor  and his survival is expected to be on the order of 6 months or less.  This was communicated to his family and they are aware of this.  He was  discharged to a nursing home last week and was admitted to [facility] last night with fever of 103.4, hypotension, presumed sepsis  from PICC line.   PAST MEDICAL HISTORY:  1. Diabetes mellitus.  2. Hypertension.  3. Tertiary hyperparathyroidism.  4. Fifth toe amputation bilaterally.  5. Congestive heart failure with ejection fraction 30-35% several      years ago.  6. History of positive PPD treated with INH.  7. Coronary artery disease.  8. Diffuse large B-cell lymphoma.  9. Renal transplant, April 2008.  10.? seizure disorder on Keppra for  prophylaxis from seizures.   MEDICATIONS PRIOR TO ADMISSION:  According to [facility] given to me by Odetta Pink, LPN.  1. Allopurinol 100 mg daily.  2. Norvasc 10 mg daily.  3. Aspirin 81 mg daily.  4. Sensipar 60 mg daily.  5. Klonopin 0.5 mg at bedtime.  6. Decadron 4 mg daily.  7. Colace 10 mg daily.  8. Lantus insulin 40 units at bedtime.  9. Neutra-Phos 250 mg t.i.d.  10.Lactobacillus once a day.  11.Keppra 500 mg b.i.d.  12.Reglan 10 mg q.i.d.  13.Prilosec 20 mg daily.  14.Oxycodone p.r.n.  15.Aredia 60 IV once a week on Tuesdays.  16.Zocor 80 mg daily.  17.Septra DS Monday, Wednesday, Friday.  18.Prograf 3.5 mg b.i.d.  19.Ambien 10 mg at bedtime p.r.n.  20.Suplena 120 mL plus 100 mL of water every 3 hours via PEG.   ALLERGIES:  PENICILLIN (Nausea, vomiting, and rash.).   SOCIAL/FAMILY HISTORY AND REVIEW OF SYSTEMS:  Unable to be obtained.  It  should be noted that Mr. 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   CURRENT MEDICATIONS:  Vancomycin, Cipro, Keppra, Protonix,  hydrocortisone, Levophed (off).   PHYSICAL EXAMINATION:  GENERAL:  He is awake.  He responds to questions.  Tracheostomy is in place.  VITAL SIGNS:  Temperature 97.4. Pulse 82.  Respirations 20.  Blood  pressure 130/60.  CHEST:  Rhonchi.  HEART:  No rub.  ABDOMEN:  Allograft in RLQ.  PEG site looks okay.  GU:  Circumcised penis.  Foley catheter in place.  EXTREMITIES:  Clotted AV graft, left upper arm.  Amputated 5th toes  bilaterally.   Hemoglobin 9.3, WBC 6100, platelets 114,000.  Sodium 137, potassium 4,  chloride 109, CO2 of 23, BUN 14, creatinine 1.61.  Chest x-ray not  available.   IMPRESSION:  1. Fever, hypotension, presumed sepsis from PICC line.  2. Renal transplant (on immunosuppression).  3. Central nervous system lymphoma, status post brain biopsy,      complicated by hemorrhage.  Finished 19 radiation therapies (still      on 4 mg of  Decadron) also on Keppra for seizure prophylaxis.  4. Diabetes mellitus.  5. Tertiary hyperparathyroidism (on Sensipar) also on Aredia 60 mg IV      once a week.  6. Prior history of gout (on Allopurinol).   PLAN:  1. Continue antibiotics, discontinue PICC line (done).  2. Continue Prograf, check level once back on usual dose.  Continue      with Neutra-Phos.  3. Still on 4 mg of Decadron per day (equals about 27 mg      prednisone/day).  Need to find out how long he is supposed to be on      this for, Suplena plus water given via PEG prior to admission,      could continue.  Also, cover      with Lantus and sliding scale.  4. Continue Sensipar.  5. Continue Allopurinol.      Richard F. Caryn Section, M.D.  Electronically Signed     RFF/MEDQ  D:  08/21/2007  T:  08/22/2007  Job:  [id number]   cc:   Cassell Smiles, MD  Molli Hazard MD Marvis Moeller

## 2010-11-16 NOTE — Discharge Summary (Signed)
Justin [name], Justin [name]              ACCOUNT NO.:  [id number]   MEDICAL RECORD NO.:  [id number]          PATIENT TYPE:  INP   LOCATION:  5532                         FACILITY:  [facility]   PHYSICIAN:  Herbie Saxon, MDDATE OF BIRTH:  08-18-1955   DATE OF ADMISSION:  06/14/2007  DATE OF DISCHARGE:  06/15/2007                               DISCHARGE SUMMARY   DISCHARGE DIAGNOSES:  1. 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  2. Chronic renal failure, status post renal transplant.  3. Diabetes mellitus.  4. Hypertension - stable.   CONSULTATIONS:  1. Cliffton Asters, M.D. infectious disease.  2. Mindi Slicker. Lowell Guitar, M.D. nephrology.   ETIOLOGY:  The CT brain of June 14, 2007, shows a probable mass at  the left basal ganglia measuring 2.3 x 2-cm with evidence of small  hemorrhage and surrounding edema and left-to-right midline shift.  Query  tumor versus hemorrhagic infarct.  Chest x-ray of June 14, 2007,  shows no active disease.   HOSPITAL COURSE:  This 55 year old African American male presented to  the emergency room with slurred speech, right sided limb weakness,  ataxic gait.He has been having these symptoms for the last 6 months but  they have gotten worse in the last 3 days.  His symptoms became  increasingly worse by 6 a.m. on the day of presentation.  No loss of  consciousness but he is more lethargic.  He denies any fever, denies any  headache or visual disturbance.  No skin rash.   PAST MEDICAL HISTORY:  1. Type 2 diabetes.  2. Diabetic stenosis.  3. GI bleed.  4. Internal hemorrhoids.  5. Gastritis.  6. Gastroesophageal reflux disease.  7. He had a renal transplant on October 05, 2006.  He has been off and on      dialysis since that time.  8. History of anemia.  9. Hyperparathyroidism.  10.Hypertension.  11.Hyperlipidemia.  12.History of coronary artery disease.  13.Peripheral vascular disease.  14.The patient is status post  cadaveric renal  transplant and      bilateral upper extremity AV grafts.   ALLERGIES:  AMPICILLIN.   This patient's condition was extensively discussed with the infectious  disease doctor and the nephrologist here at [facility] also  with the neurosurgeon and they were of the opinion that the patient  would be better served by being transferred over to [facility] where his renal transplant doctor __________ is.  He is being  discharged in stable condition to be admitted to the intensive care unit  at [facility].   DIET:  Renal, 1800 calorie, ADA, low cholesterol.   ACTIVITY:  Bed rest.   MEDICATIONS:  1. Sensipar 90 mg daily.  2. Decadron 4 mg IV q.6 h.  3. Sliding scale insulin coverage.  4. Lantus insulin 20 units subcutaneous q.h.s.  5. Reglan 10 mg a.c. and h.s.  6. Cellcept 750 mg b.i.d.  7. Protonix 40 mg IV daily.  8. Actos 45 mg daily.  9. Zocor 80 mg daily.  10.IV fluid normal saline at 100  ml/hr.  11.Tacrolimus 6 mg b.i.d.  12.Flomax 0.4 mg q.h.s.  13.Tylenol 650 mg q.4 h. p.r.n.  14.Zofran 4 mg IV q.8 h. p.r.n.   PHYSICAL EXAMINATION:  VITAL SIGNS:  The temperature is 98, pulse 81,  respiratory rate is 18, blood pressure 121/60.  It was initially 166/82.  HEENT:  Pupils equal, reactive to light and accommodation.  Mild right  hemiparesis  present, ataxia.  No nystagmus or strabismus.  NECK:  Supple.  HEART:  Sounds 1 and 2 regular.  CHEST:  Clear  ABDOMEN:  Benign.  NEUROLOGIC:  The patient is at present improved a lot and he is oriented  x3.  EXTREMITIES:  Peripheral pulses are present. No pedal edema.  Power is 4  on the right upper and lower limbs and 5 in the left upper and lower  limbs.  Babinski is downgoing.  Deep tendon reflexes 2 plus globally .  Sensation normal.   AVAILABLE LABORATORY:  Show that the hemoglobin A1c is 6.5.  Troponin  0.04, initially 0.07, CK 167, MB 2.8.  Total cholesterol 173, LDL 111,  HDL 47.   Chemistry shows sodium 136, potassium 4.6, chloride 102,  bicarbonate 27, glucose 86, BUN 21, creatinine 1.7.  Urinalysis  negative.  AST is 30, ALT is 16.  WBC is 4.3, hematocrit 45, platelet  count is 212.   The patient's condition discussed with Dr. Ruel Favors, kidney transplant  doctor and Dr. Carlena Sax, nephrologist.  He has had a 2D echocardiogram  performed this morning, result is pending.  Clinically, his right limb  weakness and his reduced level of consciousness has improved since  admission .He is being  transffered to [facility] and he is agreeable  to this.  The patient will benefit from a neurology/neurosurgery  evaluation at [facility].      Herbie Saxon, MD  Electronically Signed     MIO/MEDQ  D:  06/15/2007  T:  06/15/2007  Job:  [id number]   cc:   Mindi Slicker. Lowell Guitar, M.D.  Dr. Raynelle Fanning  Dr. Carlena Sax

## 2010-11-16 NOTE — Assessment & Plan Note (Signed)
[facility]   NAME:  Justin [name], Justin [name]              ACCOUNT NO.:  [id number]   MEDICAL RECORD NO.:  [id number]      DATE OF BIRTH:  [birth year]   PHYSICIAN:  Theresia Majors. Tanda Rockers, M.D. VISIT DATE:  03/08/2007                                   OFFICE VISIT   SUBJECTIVE:  Justin [name] is a 55 year old man who we follow for a Wagner  grade II diabetic foot ulcer involving his left foot.  In the interim,  he has procured his new inserts, and has begun to wear them.  There has  been no drainage.   OBJECTIVE:  Blood pressure is 132/79, respirations 16, pulse rate 86,  temperature 98.7.  Capillary blood glucose is 105 mg%.  Inspection of  the left fifth met-head shows that there is complete closure.  There is  no inflammation, and no drainage whatsoever.  There is 100%  reepithialization.   ASSESSMENT:  Resolved wound.  The patient has procured his inserts, and  they appear to be in good form.   PLAN:  We are discharging the patient from active followup.  We have  instructed him in routine diabetic foot care, and advised him to be seen  by the orthopedist at 3-4 intervals for inspection and replacement of  his custom inserts.  He expresses gratitude for having been seen in the  clinic, and indicates that he will be compliant.      Harold A. Tanda Rockers, M.D.  Electronically Signed     HAN/MEDQ  D:  03/08/2007  T:  03/08/2007  Job:  [id number]

## 2010-11-16 NOTE — Assessment & Plan Note (Signed)
[facility]   NAME:  Justin [name], Justin [name]              ACCOUNT NO.:  [id number]   MEDICAL RECORD NO.:  [id number]      DATE OF BIRTH:  12-Dec-1955   PHYSICIAN:  Maxwell Caul, M.D. VISIT DATE:  02/13/2007                                   OFFICE VISIT   PURPOSE OF TODAY'S VISIT:  Justin [name] is a 55 year old man we have  previously followed for Wagner's grade 2 diabetic foot ulcers over the  left fifth metatarsal head.  He was discharged from this clinic in  February 2008, and from a wound point of view he had been doing well.  He had continued at dialysis.  Interestingly, he has undergone a  transplant on October 05, 2006 at [facility].  He has since been followed by  the nephrology transplant team there.  He has not returned to see any  local nephrologist.   The patient tells me that over the last 2-3 months he is feeling an area  of firmness over the left fifth metatarsal head which has been where his  previous wounds have been here.  He has noted no drainage or pain.  He  has not been systemically ill, including no fever.  Also, over the last  month he has had discoloration of the left first toe on its dorsal  aspect just proximal to the nail.  He denies any pain or trauma to these  areas.  Of note, the patient remains quite active in terms of physical  activity.  He no longer uses a treadmill.  However, he does use a  stationary bike, going several miles per day per his description.   PHYSICAL EXAMINATION:  VITAL SIGNS:  Temperature 97.9, pulse 76,  respirations 18, blood pressure 138/70, blood glucose is 131.  SKIN:  Over the problematic previous plantar aspect of the left fifth  metatarsal head, there was some callus present.  There was also some  evidence of some low hemorrhage, with some blackened discoloration in  this area.  The whole problematic area was not much bigger than a dime.  I did pare the callus down.  Underneath this, I found no open areas.  The  skin seemed quite intact.  I did not feel the need to do a deeper  debridement today.  There was no evidence of surrounding infection.  The  first metatarsal head did indeed have some blackened discoloration just  proximal to a mycotic nail on the left first toe.  Once again, there was  no pain in this area, no drainage.  No open wound.  I did have trouble  palpating his peripheral pulses and went on to do ABIs.  These were  quite problematic, being 0.64 on the left and 0.57 on the right.  Although he describes absolutely no claudication-type symptoms, it is  quite possible that some of this discoloration might represent early  ischemia in this area.   IMPRESSION:  Diabetic foot ulcers.  The the patient really has no open  wounds.  However, I thought the areas on his left fifth metatarsal head  and to a lesser extend his right toe were worrisome enough that I went  ahead and put him in a healing sandal with felt offloading.  We covered  the area of the left fifth metatarsal head with Alevin adhesive.  We  will see him again next week.  I am hopeful that there will be no  additional problems with the area of the left fifth metatarsal head.  We  will further evaluate his possible ischemic symptomatology which at some  point may need further vascular surgery input.  I did not arrange that  today.           ______________________________  Maxwell Caul, M.D.     MGR/MEDQ  D:  02/14/2007  T:  02/15/2007  Job:  [id number]

## 2010-11-16 NOTE — Assessment & Plan Note (Signed)
[facility]   NAME:  Justin [name], Justin [name]              ACCOUNT NO.:  [id number]   MEDICAL RECORD NO.:  [id number]      DATE OF BIRTH:  February 17, 1956   PHYSICIAN:  Theresia Majors. Tanda Rockers, M.D. VISIT DATE:  05/02/2007                                   OFFICE VISIT   SUBJECTIVE:  Justin [name] is a 55 year old renal transplant patient who  has been followed in the wound center for [facility] 2 diabetic foot ulcers  involving the left fifth met head.  He has noted a thickening callus but  no excessive drainage, malodor, pain or fever.  He returns for follow-  up.  He was last seen in the wound center on September 4 with a resolved  wound.  We had recommended that he be seen by the orthotics department  and fitted for custom inserts.  He has received custom inserts.  He has  not been seen by his podiatrist.  He continues under the care of Dr.  Casimiro Needle from the nephrology service.In addition, he is complaining  of sores in his mouth.   OBJECTIVE:  Blood pressure is 161/72, respirations 18, pulse rate 79,  temperature is 98.3, capillary blood glucose is 90 mg percent. There  does appear to be hyperemic areas on the buccal mucosa.  Inspection of  his left lower extremity shows that the surgical incision of the  reamputation of the fifth digit is well healed on the volar aspect of  the fifth digit.  However, there is a thickened callus with a miniscule  ulceration extending into the subcutaneous level.  The heavy callus was  pared off without difficulty.  The patient remains insensate, the pedal  pulse is indeterminate but the capillary refill is brisk and there is no  evidence of acute ischemia.   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   PLAN:  We are referring the patient to orthotics for adjustment of his  insert.  We have also recommended that the patient develop a  relationship with a podiatrist for ongoing callus paring and periodic  adjustment of inserts.   We will give him an appointment to be  reevaluated in 2 weeks.  We have encouraged the patient to see [place] sooner  if he develops any drainage, fever or is unable to procure the  relationship with the podiatrist or the orthotist in the interim.  We  have given the patient an opportunity to ask questions, he seems to  understand.     We have recommended that he contact Justin [name] as this may be related  to part of his immunosuppressive treatment for his renal allograft.      Harold A. Tanda Rockers, M.D.  Electronically Signed     HAN/MEDQ  D:  05/02/2007  T:  05/02/2007  Job:  [id number]   cc:   Justin [name]. Lowell [name], M.D.

## 2010-11-16 NOTE — H&P (Signed)
NAME:  Justin [name], Justin [name]              ACCOUNT NO.:  [id number]   MEDICAL RECORD NO.:  [id number]          PATIENT TYPE:  INP   LOCATION:  1824                         FACILITY:  [facility]   PHYSICIAN:  Coralyn Helling, MD        DATE OF BIRTH:  1955-07-31   DATE OF ADMISSION:  08/20/2007  DATE OF DISCHARGE:  08/21/2007                              HISTORY & PHYSICAL   ADMISSION DIAGNOSIS:  Septic shock.   HISTORY:  Mr. Shorten is a 55 year old male who was transferred from the  nursing home earlier today after developing low blood pressure and  fever.  He was admitted to the nursing home on February 12 after a  prolonged hospital stay at [facility] center.  He was  seen in the [facility] in the middle part of December 2008 with  slurred speech and was found to have an intracranial mass.  He had a  history of end-stage renal disease and a cadaveric renal transplant in  April of 2008 at [facility] and had been on chronic immunosuppression.  He was transferred to [facility] for further evaluation of his  intracerebral mass.  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  However, during biopsy for this lesion, he sustained an intracerebral  hemorrhage which left him with lightheaded weakness.  He apparently also  had difficulty with dysphasia and controlling his airway secretions, and  as a result, he needed a tracheostomy and a PEG tube place.  He also had  a PICC line inserted.  The tracheostomy was done on July 12, 2007.  The trach tube was placed on July 26, 2007, and the PICC line was  placed on August 01, 2007.  He was later transferred to a skilled  nursing facility for further rehabilitation.  Per discussion with the  patient's sister who is an employee of [employer], he was in  reasonably good health up until earlier in the day when he developed  lethargy as well as fever and low blood pressure, and as a result, he  was transferred  to the [facility] Emergency Room for further evaluation.  Upon arrival in the emergency room, he was noted to have a fever of 103  with a blood pressure of 61/35. And a heart rate of 112.  He was given  gentamicin and Avelox in the emergency room as well as intravenous  fluids with improvement in his blood pressure with a systolic in the  high 80s with some slight increase in his mentation.   PAST MEDICAL HISTORY:  1. 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  He has      undergone whole brain radiation therapy for this, and per the      records, he is not a chemotherapy candidate.  2. Intracerebral hemorrhage secondary to biopsy of the intracerebral      mass with subsequent right-sided weakness.  3. Dysphagia status post tracheostomy on July 12, 2007.  He  currently has a #6 cuffless tracheostomy.  4. Status post PEG tube insertion on July 26, 2007.  5. Left leg DVT status post inferior vena caval filter placement on      July 14, 2007.  6. Hyperparathyroidism.  7. End-stage renal disease status post cadaveric renal transplant on      April of 2008.  8. Diabetes.  9. Hypertension.  10.Hyperlipidemia.  11.Coronary artery disease.  12.Peripheral vascular disease.  13.GI bleed.  14.Gastroesophageal reflux disease.  15.Hemorrhoids.  16.Anemia.  17.Right arm AV fistula  Which has been shown to be thrombosed.  1. Bilateral fifth toe metatarsal amputations.  2. Left wrist carpal tunnel syndrome.  3. Tonsillectomy.  4. PICC line insertion on August 01, 2007.   ALLERGIES:  PENICILLIN AND AMOXICILLIN WHICH CAUSED HER TO GET NAUSEOUS  AS WELL AS DEVELOPED A RASH.  HE HAS AN ALLERGY TO MORPHINE AS WELL AS  ADHESIVE TAPE.   MEDICATIONS:  Include:  1. Keppra 500 mg q 12 hours.  2. Reglan 10 mg q.i.d.  3. Prilosec 20 mg daily.  4. Oxycodone 5 mg q 4 hours p.r.n.  5. Aredia 60 mg IV on  Tuesdays.  6. Zocor 80 mg q h.s.  7. Bactrim DS 1 tablet on Mondays.  8. Prograf 5 mg daily.  9. Prograf 5 mg b.i.d.  10.Ambien 10 mg q h.s. p.r.n.  11.Allopurinol 100 mg daily.  12.Norvasc 10 mg daily.  13.Aspirin 81 mg daily.  14.Sensipar 60 mg daily.  15.Klonopin 0.5 mg as needed.  16.Dexamethasone 4 mg daily.  17.Colace 100 mg b.i.d.  18.Lantus 40 units q h.s.  19.PhosLo 250 mg t.i.d.  20.Sustiva tube feeds at 50 cc per hour.   FAMILY HISTORY:  Significant for a mother who had heart disease.   SOCIAL HISTORY:  He is disabled.  He was transferred from a nursing  home.  He has two daughters.  He is not married.  He quit smoking 10  years ago.  There is no recent alcohol use.  He does have a history of  previous cocaine use.   REVIEW OF SYSTEMS:  He was seen by Dr. Dorothyann Gibbs as well as Dr. Lavone Neri at [facility] during his recent hospitalization.  He is not able to give any  further information.   PHYSICAL EXAMINATION:  GENERAL:  He was admitted to the emergency room.  He was awake.  He responds to stimulus with moaning, but he is not  completely following commands.  VITAL SIGNS:  Temperature is 103.4, blood pressure was 61/35, heart rate  was 112, respiratory rate 18, oxygen saturation was 95% on a trach  collar.  HEENT:  He has reactive pupils, sclerae were pale.  There was no sinus  tenderness.  He has dry oral mucosa.  There was no lymphadenopathy, no  jugular venous distention.  He has a #6 cuffless tracheostomy tube in  place, and the trach site appears clean without any significant  discharge.  HEART:  S1, S2, regular rate and rhythm without any murmurs.  CHEST:  He has multiple scars underneath his collar bones bilaterally  from previous venous access.  He has diminished breath sounds but no  wheezing or rales.  ABDOMEN:  He has a PEG tube in place, and the PEG site appears clean  without any significant discharge.  His abdomen is soft.  He has   diminished breath sounds and positive bowel sounds.  There is no  guarding or rebound.  GU:  There was no obvious lesion, and he has a Foley catheter in place  with concentrated color of urine.  EXTREMITIES:  Cold to the touch.  He has a AV fistulogram in his right  arm without palpable thrill.  He has bilateral fifth toe metatarsal  amputations.  NEUROLOGIC EXAM:  He opens his eyes spontaneously.  He is able to track  spontaneously.  He makes moaning noises.  He has normal strength on the  left side but weakness on the right.   LABORATORY TESTS:  Show WBCs of 1.2, hemoglobin of 1.3, hematocrit of  34.1, white count of 134, creatinine is 1.6, PT is 16.2, INR is 1.3, PTT  is 39.  Urinalysis showed little bacteria, 7 to 10 WBCs, 3 to 6 RBCs and  free of protein, lactic acid level is 3.2.  Sodium is 134, potassium is  4.1, chloride is 104, BUN was 12, glucose was 191.  Chest x-ray showed  tracheostomy tube in place without any infiltrates or effusions.  Abdominal x-ray showed a nonspecific bowel gas pattern with an inferior  vena caval filter in place.   IMPRESSION:  1. Septic shock.  The likely source of his sepsis is from his PICC      line as his other sites with the tracheostomy and PEG sites appear      clean.  He did not appear to have any significant skin lesions, and      his chest x-ray and abdominal exam were unremarkable.  He does have      a history of PENICILLIN allergy, although I am not sure of the      significance of his reaction.  What I will do is start him on      vancomycin and ciprofloxacin, and I will follow up with his blood      cultures and __________  culture.  In discussion with the patient's      sister, she has informed me that she was told that he had a      resistant Staph infection during his hospitalization at [facility], and therefore, we will place him on contact isolation.  In      addition, I will place a central venous line, and once his  position      is confirmed, discontinue his PICC line.  I will also monitor his      central filling pressures.  Continue him on aggressive intravenous      fluid resuscitation.  2. Immune suppression secondary to his renal transplant.  I will check      his Prograf level.  I will start him on stress dose steroids and      monitor his urine output.  He may need to have renal evaluation as      well as infectious disease evaluation depending upon his clinical      response.  3. 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      He will need to have follow up for this once he is more stable.  4. Intracerebral hemorrhage with right-sided weakness.  5. Status post tracheostomy placement.  His oxygenation and      ventilation appear adequate at the present time, and therefore, I      do not think he needs to be on mechanical ventilation.  6. Left leg deep venous thrombosis status post IVC filter place on      July 14, 2007.  7. Hyperparathyroidism.  I will check his calcium, magnesium and      phosphorus.  8. End-stage renal disease status post cadaveric renal transplant.      Again, he may need renal evaluation to assist with adjustments of      his immunosuppressive regimen.  9. Diabetes.  I will start him on a hyperglycemia protocol.  10.History of hypertension, and again he is in septic shock, and      therefore, I would hold his antihypertensive medications for the      time being.  11.Hyperlipidemia.  I will hold his Zocor for the time being.  12.Coronary artery disease.  I will check his cardiac enzymes.  13.Peripheral vascular disease.  14.History of gastrointestinal bleed.  I will follow up on his CBC.  15.Gastroesophageal reflux disease.  I will start him on Protonix      intravenously.  16.Seizure prophylaxis.  I will continue him on Keppra.  17.Diastolic heart failure.  He had an echocardiogram done on July 18, 2006 which showed an ejection  fraction of 55% with left      ventricular hypertrophy and decreased relaxation in diastole.      Coralyn Helling, MD  Electronically Signed     VS/MEDQ  D:  08/21/2007  T:  08/21/2007  Job:  [id number]

## 2010-11-16 NOTE — Consult Note (Signed)
NAME:  Justin [name], Justin [name]              ACCOUNT NO.:  [id number]   MEDICAL RECORD NO.:  [id number]          PATIENT TYPE:  INP   LOCATION:  6704                         FACILITY:  [facility]   PHYSICIAN:  Juan-Carlos Monguilod, M.D.DATE OF BIRTH:  02/03/1956   DATE OF CONSULTATION:  08/29/2007  DATE OF DISCHARGE:                                 CONSULTATION   REASON FOR CONSULTATION:  Goals of care.  This nurse practitioner, Lorinda Creed, reviewed the medical records,  received reports from the team, assessed the patient and then met with  the patient's two daughters to discuss diagnosis, prognosis, goals of  care, end of life wishes, disposition and options.   GOALS:  1. Full code.  2. Continue with all available medical interventions to treat all      illnesses.  3. The family agrees to review hard choices booklet and speak by phone      tomorrow for discussion.   IMPRESSION:  This is a 55 year old unfortunate black male with end-stage  renal disease and a history of a cadaver transplant in 2008.  He has a  history of a brain tumor diagnosed as a lymphoma in December 2008.  The  diagnosis was confirmed with biopsy.  It was complicated by a  postoperative hemorrhage.  He has been ventilated during this  hospitalization and presently trached.  He is dependent for a PEG tube  for feedings.  He was residing at [facility] recently for  several weeks, when admitted to [facility] through the  emergency room for sepsis related to his percutaneous indwelling central  catheter line.  The family realizes the poor prognosis; however, there  is a difference of opinion between the two sisters, related to a  possible shift to comfort care.  The nurse practitioner will follow up  with the family in the morning and palliative care will continue to  support holistically.  The family is encourage to call with questions or  concerns.   PAST MEDICAL HISTORY:  1. Diabetes.  2.  Hypertension.  3. Hyperparathyroidism.  4. Bilateral fifth toe amputation.  5. Congestive heart failure.  Most recent ejection fraction 30%-35%.  6. History of a positive PPD, treated with INH.  7. Coronary artery disease.  8. Diffuse large B-cell lymphoma.  9. Renal transplant in April 2008.  10.Questionable seizure disorder.  The patient is on Keppra.   ALLERGIES:  PENICILLIN which causes nausea, vomiting and rash.   CURRENT MEDICATIONS:  1. As listed from [facility].  Allopurinol 100 mg daily.  2. Norvasc 10 mg daily.  3. Aspirin 81 mg daily.  4. Sensipar 60 mg daily.  5. Klonopin 0.5 mg at bedtime.  6. Decadron 4 mg daily.  7. Colace 10 mg daily.  8. Lantus 40 units at bedtime.  9. Neutra-Phos 250 mg three times daily.  10.Lactobacillus daily.  11.Keppra 500 mg twice daily.  12.Reglan 10 mg four times daily.  13.Prilosec 20 mg daily.  14.Oxycodone p.r.n.  15.Aredia 60 mcg IV once a week.  16.Zocor 80 mg daily.  17.Septra DS on Monday, Wednesday  and Friday.  18.Prograf 3.5 mg twice daily.  19.Ambien 10 mg, one p.o. q.h.s.  20.Suplena 120 mL plus 100 mL of free water q.3h. via PEG tube.   SOCIAL HISTORY:  Per the family, [name] worked for the [employer]  until he was disabled, related to his kidney disease.  He was a very  social person.  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  He has two living daughters.   FAMILY HISTORY:  Reviewed and noncontributory at this time.   REVIEW OF SYSTEMS:  The patient is unable to participate.  Per family,  the rapid decline in physical and mental status has deteriorated rapidly  since the complication of the brain biopsy.  He is basically non-verbal  and totally dependent.  There had been a report of weight loss.  The  family does not believe that Mr. Fuhrer is having any pain.   PHYSICAL EXAMINATION:  GENERAL:  This is an ill-appearing black male.  He opens his eyes.   Randomly tears up and cries at times.  Unable to  follow commands.  VITAL SIGNS:  Blood pressure 140/70, temperature 98.9 degrees, pulse 92,  respirations 22.  LUNGS:  Decreased  in the bases, scattered rhonchi.  HEART:  A regular rate and rhythm, no murmur noted.  HEENT:  Janina Mayo is in place, site unremarkable.  Mucous membranes dry.  NECK:  No lymphadenopathy noted.  ABDOMEN:  Thin, nontender.  Positive bowel sounds.  PEG site  unremarkable.  EXTREMITIES:  Warm, no edema noted.   LABORATORY DATA:  Reviewed from August 29, 2007:  Sodium 131,  potassium 4, chloride 102, CO2 of 25, glucose 246, BUN 8, creatinine  0.71.  SGOT 39, SGPT 144, total protein 5.1, albumin blood 2.2, calcium  9.  WBCs 4.3, RBCs 3.93, hemoglobin 10.9, hematocrit 33, RDW 16.9,  platelet count 228.   Total time spent on the unit was 80 minutes.  Counseling and  coordination of care comprised 50% or greater portion of this  interaction.   The diagnoses and prognosis were clarified.  The concept of palliative  care and Hospice were reviewed.  The family was educated regarding a  team approach to our service.  We elicited values and goals of care  important to the patient and family.  A hard choices booklet was left  with the family.  They are encouraged to review and call with questions  or concerns.  Palliative care services will continue to support  holistically.      Herbert Pun, NP      Rosanne Sack, M.D.  Electronically Signed    MCL/MEDQ  D:  09/04/2007  T:  09/04/2007  Job:  [id number]   cc:   Hospice & Palliative Care - Shon Baton, MD

## 2010-11-16 NOTE — Discharge Summary (Signed)
NAME:  DUWARD, ALLBRITTON              ACCOUNT NO.:  [id number]   MEDICAL RECORD NO.:  [id number]          PATIENT TYPE:  INP   LOCATION:  6704                         FACILITY:  [facility]   PHYSICIAN:  Wilber Bihari. Caryn Section, M.D.   DATE OF BIRTH:  20-Apr-1956   DATE OF ADMISSION:  08/21/2007  DATE OF DISCHARGE:                               DISCHARGE SUMMARY   DISCHARGE DIAGNOSES:  1. Sepsis secondary to PICC line (Pseudomonas putida infection).  2. End-stage renal disease status post cadaveric renal transplant in      10/2006 complicated by rejection in July 2008.  3. 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  4. Status post brain biopsy for central nervous system lymphoma      complicated with intracranial hemorrhage December 2008.  5. Hypertension.  6. Diabetes mellitus type 2.  7. Tertiary hyperparathyroidism (on IV Pamidronate at time of      admission. He had been hypercalcemic and Sensipar was  unable to be      given via  PEG tube (according to communication with [facility]      transplant team)  8. Congestive heart failure with ejection fraction of 50% per 2-D echo      on 08/28/2007.  9. Coronary artery disease.  10.Fifth toe amputation bilaterally.  11.History of positive PPD treated with INH.  12.Keppra for prophylaxis of seizures (? seizure disorder).   MEDICATION AT DISCHARGE:  1. Allopurinol 100 mg per tube daily.  2. Ciprofloxacin 400 mg IV q.12 hours until March 9 (total of 15      days).  3. Clonazepam 0.5 mg per tube at bedtime.  4. Ertapenem 1 g IV q 24 hours until March 9 (total of 14 days).  5. Fluconazole 200 mg IV daily until March 10 (total of 14 days).  6. Keppra 500 mg per tube q.12 hours.  7. Pamidronate 60 mg IV every Tuesday if needed for hypercalcemia.  8. Protonix 40 mg per tube daily.  9 . Prednisone 5 mg per tube daily.  1. Zocor 80 mg per tube before bedtime.  2. Tacrolimus 3.5 mg per tube every 12 hours.  3.  Neutra-Phos 1 packet per tube t.i.d.  4. Bactrim 20 ml per tube on Monday, Wednesday and Friday.  5. Tylenol 325 mg 1 to 2 tablets per tube q.4 to 6 hours p.r.n. fever      and pain.  6. Xanax 0.25 mg per tube q. 6 hours p.r.n. anxiety.  7. Suplena per med rec form (as used before hospitalization).  8. Water flushed 260 ml per tube every 6 hr.  9. Aspirin 81 mg per tubedaily.  10.Colace 100 mg per tube before bedtime.  11.Oxycodone 5 mg per tube q.4 hours p.r.n. pain.  12.Lantus insulin 40 Units subQ Q PM   The patient will return to [facility] where he resides.  He  will be ready to go once he has his new PICC line placed.   CONDITION AT DISCHARGE:  Improved.   PROCEDURES:  He had a 2-D echo on the  24th of February showing an  ejection fraction of 50%.  No diagnostic left ventricular regional wall  motion abnormality.  No vegetation.  Chest x-ray on the 18th of February  showed ET tube 5.5 cm from carina and low lung volumes and mild central  venous pulmonary congestion.   CONSULTATIONS:  [facility] was the initial admitting team.  Consultations were  with renal and ID with Dr. Caryn Section and Dr. Daiva Eves respectively.   HISTORY OF PRESENT ILLNESS:  Please see Dr. Sandra Cockayne and P on the 17th of  February, but briefly, Mr. Baranowski is a 55 year old black man with end-  stage renal disease secondary to diabetes mellitus status post cadaveric  renal transplant in April of 2008 complicated with rejection reaction  for which he received high doses of immunosuppressants.  He  developed  diffuse B-cell lymphoma in his brain.  He received 19 radiation  treatments at [facility], discharged from there the week prior to  admission.  He was sent to [facility], and he  presented on the 17th of February with a fever of 103.4, hypotension and  presumed sepsis from the PICC line.   REVIEW OF SYSTEMS:  Was unobtainable the day of admission.   PAST MEDICAL HISTORY:  Please see  above.   ALLERGIES:  PENICILLIN (NAUSEA, VOMITING AND RASH).   PHYSICAL EXAMINATION:  He was awake.  Responded to questions.  Tracheostomy was in place.  VITAL SIGNS:  Temperature 97.4 by the time renal service was called,  pulse 82, respiration 20, blood pressure 130/60.  CHEST EXAM:  He had rhonchi, diffusely.  HEART:  Regular rate and rhythm, no murmurs, no rubs.  ABDOMEN:  Allograft in the right lower quadrant.  PEG site looked clean.  GU:  Foley catheter.  EXTREMITIES:  Amputated fifth toes bilaterally.   LABORATORY DATA:  Hemoglobin 9.3, white blood cells 6,100, platelets  114.  Sodium 137, potassium 4.0, chloride 109, CO2 23, BUN 14,  creatinine 1.61.  Lactic acid 2.3, lipase 37, amylase 62.  ABG:  PH  7.376, pCO2 39, po2 147, bicarb 22.6.  Oxygen saturation 99.1 on oxygen  mask.  BNP 302.  TSH 0.6.  Cortisol 45.2.  A level of tacrolimus was  obtained, and it was 2.1 which is low, normal range between  5 and 20.  Culture from the cath showed 50 colonies of Pseudomonas aeruginosa  sensitive to cefepime, ceftazidime, and resistant to Cipro, gentamicin  and tobramycin.  It also grew Klebsiella pneumoniae ESBL (extended  spectrum beta lactamase) producer that was sensitive only to gentamicin,  imipenem, tobramycin.  One of two blood cultures grew Pseudomonas  putida.  A PTH hormone was high at 630.  He had three sets of C. Diff  toxin that were negative.   ASSESSMENT/PLAN:  1. Sepsis secondary to infected PICC line.  The patient presented      with fever, hypotension, and he grew Pseudomonas putida from one of      the two blood cultures, and Pseudomonas and Klebsiella pneumoniae      from the PICC line tip.  The PICC  line was removed, and he was      started on Vancomycin and Cipro empirically.  They were changed to      ertapenem and Cipro after the results of the cultures returned.  ID      was consulted with Dr. Daiva Eves who suggested  continuing these      medications for  a total of 14 days.  The patient will have the PICC      line changed  for further IV treatment.  As soon as the PICC line      is placed, he can be discharged to his skilled nursing home. PICC      should be removed after course of IV antibiotics.  2. End-stage renal disease.  Status post transplant.  He is still on      immunosuppression.  He came on 4 mg of Decadron which was      transitioned to prednisone taper.  He is discharged on prednisone 5      mg upon discussion with Dr. Kathrene Bongo.  Per renal transplant      center to modify the dosage needed.  He is still on tacrolimus, and      this will be continued in outpatient.  The patient will be      continued on his Neutra-Phos, calcitriol, pamidronate.  Renal to      monitor Prograf as outpatient.  3. Tertiary hyperparathyroidism.  He had been on Sensipar, and he is      now on Aredia, Sensipar may be given if needed.  4. Central nervous system lymphoma status post brain biopsy      complicated by hemorrhage.  He finished 19 radiation therapies at      [facility].  He is on Keppra for seizure prophylaxis.  He did not      experience any seizures while in the hospital, and  will continue      this treatment as outpatient.  His life expectancy is low      (according to the discharge summary we received from [facility]).  His      family is aware of this, and he will be discharged to a nursing      home.  5. Diabetes mellitus.  The patient was on sliding-scale and Lantus      inpatient, and he will be discharged on his home Lantus regimen.  6. History of gout.  He will be discharged on allopurinol.  7. Feeding.  The patient has a PEG tube, and he will continue his      Suplena as used before admission, and also he will continue the      water flushes as prior to admission.  8. Hypertension.  This was fairly well controlled inpatient, however,      he might need to be restarted on his Norvasc in outpatient.   DISPOSITION:  We  attempted PICC line placement but IV team was unable to  obtain access. He has 6 more ABx days to complete course, we will change  the peripheral IV line and continue ABx through this line.   LABS AT DISCHARGE:  Temperature 97.8, pulse 94, respiration rate 20,  blood pressure 142/72, oxygen saturation 100% on 28% trach collar.   LABS FROM MARCH 2:  Sodium 134, potassium 4.6, chloride 101, CO2 28,  glucose 220, BUN 14, creatinine 0.71.  GFR more than 60, albumin 2.6,  calcium 10.1, phosphorus 2.7.      Carlus Pavlov, M.D.  Electronically Signed      Wilber Bihari. Caryn Section, M.D.  Electronically Signed    CG/MEDQ  D:  09/05/2007  T:  09/05/2007  Job:  [id number]   cc:   Wilber Bihari. Caryn Section, M.D.  Acey Lav, MD  Oretha Milch, MD

## 2010-11-16 NOTE — H&P (Signed)
NAME:  Justin [name], Justin [name]              ACCOUNT NO.:  [id number]   MEDICAL RECORD NO.:  [id number]          PATIENT TYPE:  EMS   LOCATION:  MAJO                         FACILITY:  [facility]   PHYSICIAN:  Marcellus Scott, MD     DATE OF BIRTH:  March 07, 1956   DATE OF ADMISSION:  06/14/2007  DATE OF DISCHARGE:                              HISTORY & PHYSICAL   PRIMARY CARE PHYSICIAN:  Unassigned.   TRANSPLANT M.D.:  Dr. Ruel Favors at [facility].   NEPHROLOGIST:  Mindi Slicker. Lowell Guitar, M.D.   CHIEF COMPLAINT:  Slurred speech, drooling of saliva, right-sided  weakness, and ataxia.   HISTORY OF PRESENT ILLNESS:  The patient is a pleasant 55 year old right  handed African-American male patient with extensive past medical history  including type 2 diabetes, status post cadaveric renal transplant on  October 05, 2006, hypertension, secondary hyperparathyroidism, mild  coronary artery disease on catheterization in December of 2007, who  indicates that he has been having generalized weakness with no focal  symptoms since six months.  This generalized weakness has progressively  gotten worse.  After that the history is slightly conflicting in that he  has indicated to the neurosurgeons and the emergency room doctors that  he has had right-sided weakness and slurred speech for months.  However,  on my interview on repeated questioning the patient says that apart from  generalized weakness and feeling that he was going to pass out at times,  he was okay until 6 a.m. today when he noticed slurring of speech,  drooling of saliva from both angles of the mouth, right-sided weakness  lower extremity greater than upper extremity, and the inability to write  with his right hand.  Also the patient has noticed instability of gait.  The patient actually went to have his blood drawn today and then  returned home.  For worsening of symptoms, he called EMS and the patient  was brought into the emergency room where further  evaluation has  revealed left-sided basal ganglia mass lesion.  Neurosurgery has  consulted and indicate that this might be a tumor versus an abscess.  They indicate that a CT or MRI is unable to be done secondary to his  elevated creatinine.  They indicate that he is to be admitted by the  medicine service for evaluation including  infection/inflammation/metastatic workup.   The patient denies any history of headache, visual disturbances, fever,  chills, rigors, night sweats, weight loss, sick contacts, skin rash.   PAST MEDICAL HISTORY:  1. Type 2 diabetes.  2. Recurrent left foot diabetic ulcer which has healed.  3. Recurrent bright red blood per rectum.  4. Internal hemorrhoids.  5. Gastritis.  6. GERD.  7. Constipation.  8. Status post renal transplant on October 05, 2006.  The patient has      been off dialysis since April of 2008.  9. Anemia.  10.Secondary hyperparathyroidism.  11.Hypertension.  12.Hyperlipidemia.  13.Ejection fraction of 60% with mild anterior hypokinesis, minimal      coronary artery disease on catheterization in December of 2007.  14.Peripheral vascular disease.   PAST SURGICAL  HISTORY:  1. Cadaveric renal transplant.  2. Bilateral upper extremity AV grafts.   ALLERGIES:  AMOXICILLIN causes vomiting.   MEDICATIONS:  1. Cellcept 250 mg three tablets p.o. b.i.d.  2. Aspirin 81 mg p.o. daily.  3. Prograf 1 mg capsule six tablets p.o. b.i.d.  4. Simvastatin 80 mg p.o. q.h.s.  5. Flomax 0.4 mg p.o. daily.  6. Metoclopramide 10 mg p.o. t.i.d. a.c. and h.s.  7. Prednisone 5 mg p.o. daily.  8. Omeprazole 20 mg p.o. daily.  9. Bactrim single strength one p.o. Monday/Wednesday/Friday.  10.Docusate sodium 100 mg p.o. daily.  11.Sensipar 90 mg p.o. daily.  12.Actos 45 mg p.o. daily.  13.NovoLog sliding scale insulin.  14.Humalog, however unable to say which one, 35 units q.h.s.   FAMILY HISTORY:  The patient's daughter age 66 years with history of   diabetes.   SOCIAL HISTORY:  The patient is single.  He is a Haematologist.  He quit  smoking 15-20 years ago.  He smoked one pack per day for 25 years prior  to that.  No history of alcohol abuse.  The patient also abused cocaine,  the last time was 12 years ago.   REVIEW OF SYSTEMS:  14 systems reviewed and apart from history of  presenting illness is noncontributory.  Right shoulder pain on movement.  The patient with history of right shoulder trauma while lifting weights  a year ago and has had chronic pain in that shoulder.  The patient  denies any chest pain, dyspnea, or palpitations.   PHYSICAL EXAMINATION:  GENERAL:  The patient is a moderately built and  nourished male patient in no acute distress.  VITAL SIGNS:  Temperature 96.8 degrees Fahrenheit, blood pressure 125/71  mmHg, pulse 69 per minute regular, respirations 20 per minute saturating  at 99% on room air.  HEENT:  Normocephalic and atraumatic.  Pupils equal, round, and reactive  to light and accommodation.  Bilateral immature cataract.  Oral cavity  with no oropharyngeal erythema or thrush.  Gag reflex is preserved.  NECK:  Without JVD, carotid bruit, lymphadenopathy, or goiter.  Supple.  LYMPHATICS:  No lymphadenopathy.  LUNGS:  Clear to auscultation.  CARDIOVASCULAR:  First and second heart sounds heard.  No third or  fourth heart sounds or murmurs.  ABDOMEN:  Nondistended and nontender.  Right lower quadrant laparotomy  scar.  ? transplant kidney felt in the right iliac fossa.  No other  organomegaly or masses.  Bowel sounds are normally heard.  NEUROLOGY:  The patient is awake, alert and oriented x3.  Diminished  right nasolabial fold.  Drooling of saliva from the right angle of the  mouth.  Dysarthria.  Uvula is midline.  Tongue movements are normal.  No  other cranial nerve deficits.  EXTREMITIES:  No cyanosis, clubbing, or edema.  Right AV graft bruit.  Peripheral pulses are symmetrically felt.  Right upper  extremity is  grade 5/5 strength, right lower extremity is flaccid with grade 4+/5.  Symmetric 2+ reflexes bilateral equal plantars.  No sensory deficits.  SKIN:  Without any rashes.   LABORATORY DATA:  Point of care cardiac markers are negative.  Urinalysis negative.  Comprehensive metabolic panel remarkable for BUN  23, creatinine 1.84, calcium 10.6.  CBC with hemoglobin 14.5, hematocrit  46, white blood cell count 4.3, platelets 212.  CT of the head without  contrast reported as probably mass at left basal ganglia 2.3 x 2 cm in  size with evidence of small  associated hemorrhage, significant  surrounding white matter edema, and left midline shift.  This mostly  represents tumor though other etiologies including infection and  nontumorous hemorrhagic infarct should be excluded.   His chest x-ray has been reviewed by me and reported as no acute  problems.   ASSESSMENT:  1. 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  Etiology in a post renal transplant patient includes      infection/abscess versus inflammatory versus metastatic carcinoma.      We will admit to telemetry.  We will obtain blood cultures.  I have      had an extensive discussion with the infectious disease specialist      on call, Cliffton Asters, M.D., who indicates that in this renal      transplant who has no clinical features suggestive of sepsis such      as fever, leukocytosis, or symptom-wise, to proceed only with blood      cultures and hold antibiotics.  However, if the patient becomes      febrile or the blood cultures are positive to consider further      workup including echocardiogram and consider starting antibiotics.      Also in the morning to contact the transplant team at [facility] and consider transferring him over since they know his      overall medical condition best.  Neurosurgery has  already consulted      on the patient.  We will hold aspirin at this time and place the      patient on IV Decadron.  Chest x-ray is negative for anything      acute.  I would like to image with CT with and without contrast or      an MRI with or without contrast pending improvement of his renal      function.  2. Renal insufficiency in renal transplant patient.  Unclear of      baseline creatinine.  We will continue his immunosuppressive      medicines of Cellcept and Prograf.  We will hydrate with IV normal      saline and follow-up basic metabolic panel.  Renal physicians have      been consulted by the emergency room doctors and will see the      patient.  3. Diabetes.  Continue home medications.  4. Hypertension which is controlled.      Marcellus Scott, MD  Electronically Signed    AH/MEDQ  D:  06/14/2007  T:  06/14/2007  Job:  [id number]   cc:   Mindi Slicker. Lowell Guitar, M.D.  Cliffton Asters, M.D.  Tia Alert, MD

## 2010-11-16 NOTE — Assessment & Plan Note (Signed)
[facility]   NAME:  CORDARRIUS, COAD              ACCOUNT NO.:  [id number]   MEDICAL RECORD NO.:  [id number]      DATE OF BIRTH:  1955/09/24   PHYSICIAN:  Theresia Majors. Tanda Rockers, M.D. VISIT DATE:  02/22/2007                                   OFFICE VISIT   SUBJECTIVE:  Mr. Justin [name] is a 55 year old man whom we have followed for a  Wagner grade 2 diabetic foot ulcer involving the fifth met head.  He was  last seen on August 12 with a recurrence of subdermal hemorrhage and was  placed in an offloading healing sandal while awaiting a the repair  and/or fitting for new custom inserts.  There has been no interim  drainage.  He returns for followup.   OBJECTIVE:  Blood pressure 131/72, respirations 16, pulse rate 87,  temperature 98.1.  Capillary blood glucose is 205.  Inspection of the volar aspect of the left foot shows that there is no  significant increase in the subdermal hemorrhage.  There is no  communication with the subcutaneous tissue via ulceration.  There is no  evidence of acute or ascending infection.  The capillary refill is  brisk.  The patient has drawn attention to some discoloration under nail  bed of the fifth hallux.  This area represents a recent trauma and an  onychomycotic nail.  Using the clippers, the nail was debrided without  incident.  There is no evidence of ulceration subungual.   ASSESSMENT:  Adequate offloading of the healing sandal.   PLAN:  We will continue the offloading until the patient came procure  the customized inserts.  We will reevaluate him in 2 weeks.  At that  time he should have his customized footwear.      Harold A. Tanda Rockers, M.D.  Electronically Signed     HAN/MEDQ  D:  02/22/2007  T:  02/23/2007  Job:  [id number]

## 2010-11-16 NOTE — Consult Note (Signed)
NAME:  KORREY, SCHLEICHER              ACCOUNT NO.:  [id number]   MEDICAL RECORD NO.:  [id number]          PATIENT TYPE:  REC   LOCATION:  FOOT                         FACILITY:  [facility]   PHYSICIAN:  Jonelle Sports. Sevier, M.D. DATE OF BIRTH:  04/29/1956   DATE OF CONSULTATION:  05/16/2007  DATE OF DISCHARGE:                                 CONSULTATION   HISTORY:  This 55 year old black male has been followed for a recurrent  diabetic ulcer on the plantar aspect of the left foot underlying the  fifth metatarsal head.   This had recurred in pretty short order after an earlier ulcer, and it  was found that his footwear had not been recently upgraded and adjusted,  and once this was done the wound has moved to healing.   The patient reports no problems.  No open sore, no drainage, no odor, no  swelling, no pain or any other concerns today.   Blood pressure 140/73, pulse 77, respirations 18, temperature 98.4,  blood glucose 125.   The wound on the left foot is found to be completely healed, and there  is no reaccumulating callus in that area.   His footwear is inspected and is found to be adequate.   The patient will be released from the clinic to return on a p.r.n. basis  should he have recurrence of difficulties.           ______________________________  Jonelle Sports Cheryll Cockayne, M.D.     RES/MEDQ  D:  05/16/2007  T:  05/17/2007  Job:  [id number]

## 2010-11-16 NOTE — Consult Note (Signed)
Justin [name], Justin [name]              ACCOUNT NO.:  [id number]   MEDICAL RECORD NO.:  [id number]          PATIENT TYPE:  INP   LOCATION:  5532                         FACILITY:  [facility]   PHYSICIAN:  Tia Alert, MD     DATE OF BIRTH:  September 02, 1955   DATE OF CONSULTATION:  DATE OF DISCHARGE:                                 CONSULTATION   CHIEF COMPLAINT:  Left basal ganglia lesion.   HISTORY OF PRESENT ILLNESS:  Justin [name] is a very pleasant 55 year old  gentleman, who is an employee at [employer], who presents with  a several-month history of progressively worsening difficulties with his  right side.  He denies any significant weakness or significant numbness,  just states that there is a funny feeling on his right side involving  his arm and leg, and he states that when he tries to make them work,  they do not work like he wants them to.  He has had some recent falls.  He is status post renal transplant in April of this year.  He has had  recent oral and skin infections.  He is on suppressive Bactrim, and  though I do not see medications and his medication bag, his medication  list suggests that he has been on an antiviral and Diflucan for  suppression of infection, also.  He has a remote smoking history but  denies any of the history of cancer.  He denies headaches.  He denies  visual changes.  He denies seizure activity.   PAST MEDICAL HISTORY:  Diabetes, dyslipidemia, status post renal  transplant, status post multiple vascular grafts placed in his arms  bilaterally.   MEDICATIONS:  Actos, aspirin, prednisone, omeprazole, stool softener,  metoclopramide, Bactrim, CellCept, Prograf, Sensipar, Flomax,  simvastatin.   ALLERGIES:  PENICILLIN AND  AMOXICILLIN.   SOCIAL HISTORY:  Remote history of smoking.  He started in the ninth  grade.  Stopped smoking at age 1.  Denies significant alcohol use.   PHYSICAL EXAM:  VITAL SIGNS:  Temperature 97.2, pulse 71, respirations  18.  GENERAL:  Pleasant, cooperative male, seated in a stretcher.  HEENT:  Normocephalic, atraumatic.  Extraocular movements are intact.  NECK:  Supple, no nuchal rigidity.  HEART:  Regular rate and rhythm.  EXTREMITIES:  No obvious deformities, but there are multiple well-healed  scars on his forearms from his vascular grafts. NEUROLOGICAL:  He is  awake and alert.  He is pleasant and interactive.  No aphasia.  He has  fairly good attention span.  His fund of knowledge and memory are  difficult to tell if they are actually at his baseline.  He has  difficulty recalling what type of infections he has had, what  medications he was put on.  He can not recall diabetes doctor's name.  I find maybe very mild right facial droop.  Tongue protrudes to midline.  He can puff  out his cheeks.  He can close his eyes tightly.  He has no  pronator drift.  He has equal grips.  He seems to move his extremities  equally.  His  gait is not tested.   IMAGING STUDIES:  CT scan of the brain was done without contrast,  because of his renal transplant, and this shows a thick-walled circular  lesion in the left basal ganglia, with significant surrounding edema and  some shift of midline structures.  No hydrocephalus.  I see no other  lesions.   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  He  has no known primary lesion or primary infection at this point.  This  could be a primary brain tumor, but I think this is less likely, given  his imaging characteristics.  Unfortunately, we need a CT scan of the  head or MRI of the brain with and without contrast, and the radiologist  will not do this at this point with an creatinine of 1.8.  His  nephrologist has been consulted, and Dr. Casimiro Needle with see him  tomorrow in consult.  Maybe he could help [place]  decide whether we can  proceed with a contrast-enhanced image of the brain.  He will also need  workup systemically for tumor or infection, such as an ultrasound of the  heart to rule out SBE, and a chest, abdomen, pelvis CT with and without  contrast.  A simple chest x-ray would also help to rule out lung  disease.  An infectious disease consult would likely be helpful.  If no  primary source of this lesion can be detected, then he would need a  stereotactic-guided brain biopsy, to try to achieve a pathologic  diagnosis of this lesion.      Tia Alert, MD  Electronically Signed     DSJ/MEDQ  D:  06/14/2007  T:  06/15/2007  Job:  [id number]

## 2010-11-19 NOTE — Assessment & Plan Note (Signed)
[facility]   NAME:  Justin [name], Justin [name]              ACCOUNT NO.:  [id number]   MEDICAL RECORD NO.:  [id number]      DATE OF BIRTH:  January 05, 1956   PHYSICIAN:  Theresia Majors. Tanda Rockers, M.D.      VISIT DATE:                                   OFFICE VISIT   VITAL SIGNS:  Blood pressure is 147/82, respirations 18, pulse rate 89  and he is afebrile.   PURPOSE OF TODAY'S VISIT:  Mr. Tillery returns for followup of a Wagner  grade 2 ulceration over in the area of surgery which was performed on  the right fifth metatarsal.  He has been treated with a total-contact  cast to effect a complete offloading.  He denies interim pain, fever or  malodor.   WOUND EXAM:  Inspection of the foot shows that the ulcer has completely  healed.  There is no evidence of drainage.  There is no induration.  There is no local inflammation.   The patient has procured his custom orthotics.   DIAGNOSIS:  Resolved wound with adequate-appearing orthotics.   MANAGEMENT PLAN AND GOAL:  The patient will initiate the use of his  custom orthotics.  We will reevaluate him in two weeks to ensure that  there has been no recurrence.  The patient understands that if he notes  any discoloration on his white sock, drainage, discomfort, or concern  whatsoever, he will call the wound center sooner.  Otherwise, we will  reevaluate him in two weeks.           ______________________________  Theresia Majors Tanda Rockers, M.D.     Justin [name]  D:  07/25/2006  T:  07/25/2006  Job:  [id number]

## 2010-11-19 NOTE — H&P (Signed)
[place]  Patient:    Justin [name], Justin [name] Visit Number: [id number] MRN: [id number]          Service Type: MED Location: [phone] Attending Physician:  Maree Krabbe Dictated by:   Ebbie Ridge, M.D. Admit Date:  08/11/2001   CC:         [facility]   History and Physical  CHIEF COMPLAINT:  Intractable nausea and vomiting.  HISTORY OF PRESENT ILLNESS:  Mr. Bellamy is a 55 year old black male with past medical history significant for type 1 diabetes and end-stage renal disease requiring hemodialysis Tuesday, Thursday and Saturday at the [facility], who presents to the emergency room via EMS tonight complaining of intractable vomiting since 5 p.m. (approximately seven hours).  He reports nausea and occasional vomiting for the last three days (one to two episodes per day), but tonight he started vomiting on the way home from hemodialysis, and has been unable to stop.  His emesis in the emergency room is coffee-grounds and heme-positive.  Patient denies abdominal pain/cramping/epigastric burning/diarrhea/constipation/fever/chills/foreign travel/or unusual foods.  He denies melena or hematochezia.  His last bowel movement was two days ago and was normal.  He does report UTI symptoms for the past 7 to 10 days with cough and congestion.  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  He denies any chest pain, shortness of breath, light-headedness.  He has never had an ulcer or GI bleed in the past and does not drink alcohol.  He does take NSAIDs occasionally p.r.n. leg pain, and was started on a course of Keflex 500 mg b.i.d. on January 30th for a skin infection on his head.  PAST MEDICAL HISTORY: 1. Type 1 diabetes mellitus diagnosed at age 2. 2. Hypercholesterolemia. 3. History of congestive heart failure secondary to volume  overload. 4. Peripheral vascular disease, status post right fifth toe amputation. 5. Gastroesophageal reflux disease, no history of peptic ulcer or GI bleed. 6. End-stage renal disease, hemodialysis Tuesday/Thursday/Saturday, 11 a.m.,    at [facility]. 7. Status post thrombectomy of left A-V graft in 2000.  MEDICATIONS: 1. Insulin 70/30, 10 units b.i.d. 2. Zocor 20 mg q.d. 3. Calcium carbonate three tabs p.o. t.i.d. with meals. 4. Nephro-Vite one p.o. q.d. 5. Famotidine one p.o. q.d. 6. Keflex 500 mg b.i.d. since January 30th. 7. Ibuprofen 800 mg t.i.d. p.r.n.  ALLERGIES:  No known drug allergies.  SOCIAL HISTORY:  Mr. Isenberg lives alone in a [place] apartment.  He has two daughters, ages 76 and 63.  He denies alcohol or drug use.  He quit smoking 10 years ago.  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  He is on disability.  FAMILY HISTORY:  Mom died at age 61 of an MI.  Father died from being stabbed.  REVIEW OF SYSTEMS:  Positive for a 20-pound intentional weight loss in the last six months, as the patient is trying to get on the transplant list.  He denies headache, night sweats, lymphadenopathy, fever or chills.  He is anuric.  He denies any skin rashes except for the bump on his head, which is currently being treated.  Review of systems is otherwise per HPI.  PHYSICAL EXAMINATION:  VITAL SIGNS:  Temperature 97.6, blood pressure 190/110, pulse 108, respiratory rate 22, pulse oximetry 98% on room air.  GENERAL:  This is a pleasant, cooperative black male in mild distress secondary to  vomiting.  HEENT:  Normocephalic and atraumatic.  There is a carbuncle on the posterior scalp about 1 cm in diameter, crusted discharge, but no erythema or induration.  Muddy sclerae.  Pupils are equally round and reactive to light. Mucous membranes are tacky.  LUNGS:  Clear to auscultation bilaterally.  NECK:  Supple.  No lymphadenopathy.  No JVD.  CARDIOVASCULAR:  Regular  rate and rhythm without murmur, gallop or rub; he does become tachycardic with vomiting.  ABDOMEN:  Soft, nontender, nondistended.  Bowel sounds are quiet but present. There is no rebound, no guarding, no epigastric tenderness.  RECTAL:  Soft brown stool in vault, heme-negative.  EXTREMITIES:  There is a left A-V fistula in the upper arm, no edema of lower extremities, 2+ dorsalis pedis pulses, status post right fifth toe amputation.  NEUROLOGIC:  Alert and oriented x3.  No focal deficits.  LABORATORY AND ACCESSORY DATA:  White blood count is 5.6, with 77% neutrophils and 15% lymphocytes.  Hemoglobin is 15.8 with hematocrit 48.8.  Platelet count is 196,000.  Sodium 141, potassium 3.9, chloride 92, bicarb 33, BUN 32, creatinine 9.9, glucose 141, calcium 10.4.  Total bilirubin is 0.9, alkaline phosphatase 120, total protein 9.2, AST 59, ALT 38, albumin 3.9.  Amylase is 161 and lipase 32.  KUB revealed no constipation, no disease.  ASSESSMENT AND PLAN:  Mr. Digioia is a 55 year old black male with past medical history significant for type 1 diabetes mellitus and end-stage renal disease who presents with acute upper gastrointestinal bleed.  1. Upper gastrointestinal bleed, possibly a Mallory-Weiss tear secondary to    repeated vomiting versus gastritis versus bleeding ulcer.  The differential    diagnosis for vomiting includes gastritis, peptic ulcer disease, diabetic    gastroparesis, uremia, pancreatitis, appendicitis, viral gastroenteritis or    reaction to Keflex.  Pancreatitis is unlikely with a normal lipase.    Appendicitis is unlikely with a normal abdominal exam and normal white    blood count.  Uremia is unlikely with a BUN of 32, status post dialysis    today.     We will treat with Reglan, Protonix and Phenergan/Zofran p.r.n.  Check a    KUB to rule out constipation or ileus.  Keep patient n.p.o. overnight and    advance to clear liquid diet tomorrow if nausea and vomiting  resolve.  Hold     Keflex.  If hemoglobin drops significantly overnight or nausea or vomiting    are refractory to medications, we will ask gastroenterology to see    urgently.  Otherwise, consider gastroenterology consult in a.m. versus    outpatient evaluation for possible endoscopy.  If nausea and vomiting are    refractory to Zofran, we can place an nasogastric tube tonight.  2. Type 1 diabetes mellitus.  We will continue insulin 70/30 at half of    regular dose to avoid diabetic ketoacidosis.  Sliding-scale insulin and    capillary blood glucose checks q.6h.  American Diabetic Association diet,    once patient taking p.o.s.  3. End-stage renal disease.  We will call [facility] for    records on Monday if patient is still here.  There are no signs of volume    overload or uremia to indicate dialysis now.  Continue    Tuesday/Thursday/Saturday schedule.  4. Hypercholesterolemia.  We will hold the Zocor while patient is vomiting,    resume once he is taking p.o.s adequately.  5. Skin infection on scalp.  This appears  to be healing well.  We will hold    Keflex and monitor.  If antibiotic treatment is needed, we will resume a    different antibiotic.  6. Hypertension, most likely secondary to the stress of vomiting.  We will    monitor.  If it is still high tomorrow, consider starting a beta blocker. Dictated by:   Ebbie Ridge, M.D. Attending Physician:  Maree Krabbe DD:  08/12/01 TD:  08/12/01 Job: [id number]

## 2010-11-19 NOTE — Assessment & Plan Note (Signed)
[facility]   NAME:  Justin [name], Justin [name]              ACCOUNT NO.:  [id number]   MEDICAL RECORD NO.:  [id number]          DATE OF BIRTH:   PHYSICIAN:  Theresia Majors. Tanda Rockers, M.D. VISIT DATE:  05/30/2006                                   OFFICE VISIT   VITAL SIGNS:  Blood pressure is 119/60, respirations 16, pulse rate 67,  he is afebrile.  Capillary blood glucose is 128 mg%.   PURPOSE OF TODAY'S VISIT:  [name] is a 55 year old diabetic with a  Wagner grade 2 ulceration on the fourth mid head of his left foot.  Patient has been treated with a Darco boot as an off-loader.  He reports  no excessive drainage, fever, or malodor.   WOUND EXAM:  Inspection of the wound shows that there are areas of  necrosis, particularly on the periphery associated with callus  formation.   TISSUE DEBRIDED:  The callus was pared, the wound was debrided, full  thickness, with hemorrhage control with direct pressure.   DIAGNOSIS:  Improved wound, question of adequate offloading.   MANAGEMENT PLAN & GOAL:  We will reapply the Cam walker with a soft sole  and calcium alginate.  We will follow up the patient in 1 week.  If  there is no appreciable near healing response to these current efforts  of offloading, we will place the patient in a total contact cast.  We  have explained this approach to the patient in terms that he seems to  understand.  He expresses a willingness to proceed as outlined.           ______________________________  Theresia Majors. Tanda Rockers, M.D.     [name]  D:  05/30/2006  T:  05/31/2006  Job:  [id number]

## 2010-11-19 NOTE — Assessment & Plan Note (Signed)
[facility]   NAME:  TJ, [name]              ACCOUNT NO.:  [id number]   MEDICAL RECORD NO.:  [id number]      DATE OF BIRTH:  [birth year]   PHYSICIAN:  Theresia Majors. Tanda Rockers, M.D. VISIT DATE:  05/22/2006                                     OFFICE VISIT   VITAL SIGNS:  Blood pressure is 157/80, respirations 18, pulse rate 67, and  he is afebrile.   PURPOSE OF TODAY'S VISIT:  [name] returns for a followup of a Wagner  grade 2 ulceration over the volar aspect of the head of the fifth metatarsal  of the left foot.  In the interim, he has been offloaded with a Cam walker  and also being dressed with a calcium alginate dressing.  He denies interim  fever, pain, swelling, or malodor.  An interim culture has grown multiple  organisms, none predominant.  He denies fever.   WOUND EXAM:  Inspection of the wound shows a halo of callus and the process  extending to the central portion of the wound.  This is decreased in volume.  A photograph was taken and entered into the Wound Expert.  A full-thickness  debridement was performed using a 10 blade with hemorrhage control with  direct pressure.  Thereafter, the wound was dressed with calcium alginate  and a bulky dressing.   DIAGNOSIS:  Improved Wagner grade 2 ulceration.   MANAGEMENT PLAN & GOAL:  We will continue the patient with effective  offloading and we will continue administration of antibiotics per his  nephrologist.  We will reevaluate him in one week.           ______________________________  Theresia Majors. Tanda Rockers, M.D.     Justin [name]  D:  05/22/2006  T:  05/23/2006  Job:  [id number]   cc:   [facility]

## 2010-11-19 NOTE — Assessment & Plan Note (Signed)
[facility]   NAME:  Justin [name], Justin [name]              ACCOUNT NO.:  [id number]   MEDICAL RECORD NO.:  [id number]      DATE OF BIRTH:  06-05-1956   PHYSICIAN:  Theresia Majors. Tanda Rockers, M.D. VISIT DATE:  06/20/2006                                   OFFICE VISIT   ADDENDUM:  The patient underwent debridement of all of his nailbeds  without difficulty.           ______________________________  Theresia Majors Tanda Rockers, M.D.     [name]  D:  06/20/2006  T:  06/21/2006  Job:  [id number]

## 2010-11-19 NOTE — Discharge Summary (Signed)
NAME:  Justin [name], Justin [name]              ACCOUNT NO.:  [id number]   MEDICAL RECORD NO.:  [id number]          PATIENT TYPE:  INP   LOCATION:  5502                         FACILITY:  [facility]   PHYSICIAN:  Alvin C. Lowell Guitar, M.D.  DATE OF BIRTH:  01-04-1956   DATE OF ADMISSION:  06/23/2006  DATE OF DISCHARGE:  06/26/2006                               DISCHARGE SUMMARY   DISCHARGE DIAGNOSES:  1. Chest pain, resolved  2. Anemia.  3. Secondary hyperparathyroidism.  4. Hypertension.  5. Diabetes mellitus.  6. Hyperlipidemia.  7. End-stage renal disease.  8. Resolved bright red blood per rectum.   PROCEDURE:  On June 26, 2006, left heart catheterization.   FINDINGS:  1. EF 60% with mild anterior hypokinesis.  2. No aortic stenosis or mitral regurgitation.  3. Minimal coronary artery disease.   CONSULTATIONS:  Salvadore Farber, M.D.   HISTORY OF PRESENT ILLNESS:  This is a 55 year old African American male  with a history of end-stage renal disease who receives dialysis at the  [facility] on Tuesday, Thursday and Saturday.  He  presented now with a one-week history of intermittent but progressive  left-sided chest pain lasting up to 5 minutes at any time.  The pain is  nonexertional and not brought on by any specific activity.  It is  nonradiating and not associated with nausea, vomiting or diaphoresis.  The pain is relieved with specific movements, particularly when he is  lying in bed.  In the emergency room, T-wave changes were noted on  electrocardiogram.  Therefore, the recommendation for admission has  occurred.   ADMISSION LABORATORY DATA:  WBC 3.6, hemoglobin 12.2, hematocrit 37.7,  platelets 281.  Sodium 137, potassium 5.7, chloride 100, CO2 25, glucose  119, BUN 52, creatinine 12.3, calcium 8.6.  CK-MB 6.7, troponin less  than 0.05, myoglobin greater than 500.   DIAGNOSTIC RADIOLOGICAL EXAMINATIONS:  On June 23, 2006, two-view  chest x-ray, with  impression of no acute findings.   HOSPITAL COURSE:  Problem 1.  Resolved chest pain.  The patient was  admitted.  Cardiac enzymes were performed which were benign.  An EKG was  also performed with some changes noted.  Cardiology was consulted at  that time.  The patient was initiated on nitroglycerin, aspirin, beta-  blocker, heparin, and Plavix therapy.  He underwent a cardiac  catheterization on June 26, 2006, with results as above.  The  patient remained pain-free throughout the remainder of his  hospitalization, with no cardiac explanation for his chest pain found.   Problem 2.  Anemia.  The patient was continued on his outpatient regimen  of low-dose Epogen therapy throughout his hospital stay.  The patient  tolerated it well and without difficulty, and his hemoglobin remained  stable during his hospitalization.  At the time of discharge, the  patient's hemoglobin was 11.9 and hematocrit 35.7.  Please refer to #8  for further information.   Problem 3.  Secondary hyperparathyroidism.  The patient was continued on  his outpatient regimen of vitamin D and phosphate binder therapy which  he seemed to tolerate well  and without difficulty.  At the time of  discharge, the patient's calcium was 8.9.   Problem 4.  Hypertension.  Upon admission, the patient was continued on  his outpatient regimen of lisinopril daily.  As a result of the  patient's chest pain, beta-blocker therapy was initiated.  The patient's  blood pressure seemed fairly well-controlled throughout his  hospitalization, and on the day of discharge, the patient's systolic  blood pressure was 135 and diastolic 78.   Problem 5.  Diabetes mellitus.  The patient was placed on sliding scale  insulin with Accu-Cheks q.a.c. and q.h.s.  The patient's blood glucose  level was very well-controlled during his hospitalization, ranging from  88 to 130.   Problem 6.  Hyperlipidemia.  The patient continued his outpatient   regimen of statin therapy for his cholesterol during his hospital stay  which he seemed to tolerate well and without difficulty.   Problem 7.  End-stage renal disease.  Hemodialysis was continued  throughout the hospitalization per the patient's outpatient schedule via  a left IJ Diatek catheter.  Average blood flow rate was 400 and average  ultrafiltration approximately 4 liters.  The patient's blood pressure  remained stable throughout his dialysis treatments, with average  systolic blood pressure ranging from the  1-teen's to 140.   Problem 8.  Resolved bright red blood per rectum.  During the patient's  hospitalization, he did have a complaint of bright red blood on his  bowel movements.  The patient states this has occurred before, and he  does have a confirmed history of hemorrhoids by a previous colonoscopy.  A rectal exam was performed and unremarkable.  Hemoccult stool was  collected and was also negative.  The patient was continued on Pepcid  therapy upon admission and throughout his hospitalization per his  outpatient regimen.  The patient's hemoglobin remained stable, and he  remained without any signs of traumatic blood loss throughout his  hospitalization.  A followup appointment will be scheduled on an  outpatient basis with his GI physician.   DISCHARGE MEDICATIONS:  1. Lisinopril 40 mg n.p.o. q.h.s.  2. Sensipar 120 mg daily.  Take with largest meal.  3. Xanax 0.5 mg daily p.r.n.  4. Zocor 80 mg one p.o. q.h.s.  5. PhosLo 667 mg two p.o. t.i.d. with meals.  6. Nephro-Vite one p.o. daily.  7. Famotidine 20 mg one p.o. q.h.s.  8. Aspirin 81 mg one p.o. daily.  9. Metoclopramide 10 mg one p.o. q.a.c. and q.h.s.  10.Coreg 3.125 mg one p.o. b.i.d.  Hold mornings of dialysis.  11.The patient is to resume his outpatient eye drops per his      preadmission regimen.   HEMODIALYSIS MEDICATIONS: 1. Epogen 3500 units IV every hemodialysis treatment.  2. Hectorol 10.5 mcg IV  every hemodialysis treatment.  3. Heparin 7000 units IV every hemodialysis treatment.   DIET:  The patient is to resume a renal diabetic diet.   ACTIVITY:  As tolerated.   FOLLOW UP:  The patient has a follow-up appointment on July 10, 2006  with [facility] regarding permanent access placement.  The patient will follow  up with cardiology on an as-needed basis, with no appointment at this  time.  An outpatient appointment will be made at the outpatient  hemodialysis center for GI consultation regarding the bright red blood  per rectum.  The patient has been instructed that should the bleeding  worsen, to notify providers or present to the emergency department.  HEMODIALYSIS INSTRUCTIONS:  Please schedule an appointment with the  patient's GI physician regarding the bright red blood per rectum.  The  patient will resume 2 K 2.5 calcium bath and continue the 4 hour 15-  minute hemodialysis treatment time.  The patient's estimated dry weight  will remain 100.0 kg, and we will continue hemodialysis through the  patient's Diatek catheter.  However, he is scheduled for permanent  access placement on July 10, 2006.      Tracey P. Sherrod, NP      Mindi Slicker. Lowell Guitar, M.D.  Electronically Signed    TPS/MEDQ  D:  06/30/2006  T:  06/30/2006  Job:  [id number]   cc:   [facility]  [facility]  Salvadore Farber, MD

## 2010-11-19 NOTE — Assessment & Plan Note (Signed)
[facility]   NAME:  Justin [name], Justin [name]              ACCOUNT NO.:  [id number]   MEDICAL RECORD NO.:  [id number]           DATE OF BIRTH:   PHYSICIAN:  Theresia Majors. Tanda Rockers, M.D. VISIT DATE:  05/08/2006                                     OFFICE VISIT   VITAL SIGNS:  Blood pressure is 130/80, respirations 18, pulse rate 72 and  he is afebrile.   PURPOSE OF TODAY'S VISIT:  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  In the interim, he has had his custom shoes adjusted.  The patient reports  that he is unable to see his feet and so therefore he does not inspect his  feet daily, but he does ask the dialysis nurse to take a look at them.  He  presents with a nurse having observed a thickened callus.  He denies fever  or pain.   WOUND EXAM:  Examination of the lower extremity shows that the thick callus  is associated with an ulcer into the subcutaneous area but not penetrating  to the bone or joint capsule.  This area underwent a full-thickness  debridement with resultant hemorrhage control with a silver nitrate stick.  The depth of the wound was cultured.  Inspection of his custom shoe insert  shows that the insert indentation suggests an adequate off-loading.  The  patient's pulse remains readily palpable, but he remains insensate.   WOUND SINCE LAST VISIT:   CHANGE IN INTERVAL MEDICAL HISTORY:   DIAGNOSIS:  Wagner grade 2 ulcer, questionable adequacy of the off-loading.   TREATMENT:   ANESTHETIC USED:   TISSUE DEBRIDED:   LEVEL:   CHANGE IN MEDS:   COMPRESSION BANDAGE:   OTHER:   MANAGEMENT PLAN & GOAL:  We will dress the wound with a calcium alginate and  apply a topical silver gel soft sore dressing.  We will protect this area  from undue pressure.  The patient has been instructed to bring his Cam  walker to the clinic tomorrow after his dialysis at which time we will  refit  that to his foot to provide a more  effective off-load.  Once the wound is healed, we will have his inserts  readjusted.  At the present time, however, he is to not wear the custom shoe  because I do not feel that it is adequately off-loading the breakdown area.  The patient has been given an opportunity to ask questions.  He seems to  understand.  We will see him tomorrow.           ______________________________  Theresia Majors Tanda Rockers, M.D.     [name]  D:  05/08/2006  T:  05/09/2006  Job:  [id number]

## 2010-11-19 NOTE — Op Note (Signed)
NAME:  Justin [name], Justin [name]              ACCOUNT NO.:  [id number]   MEDICAL RECORD NO.:  [id number]          PATIENT TYPE:  AMB   LOCATION:  SDS                          FACILITY:  [facility]   PHYSICIAN:  Di Kindle. Edilia Bo, M.D.DATE OF BIRTH:  September 01, 1955   DATE OF PROCEDURE:  05/16/2006  DATE OF DISCHARGE:                                 OPERATIVE REPORT   PREOPERATIVE DIAGNOSIS:  Chronic renal failure.   POSTOPERATIVE DIAGNOSIS:  Chronic renal failure.   PROCEDURE:  Is ultrasound-guided placement of a left IJ Diatek catheter.   SURGEON:  Dr. Edilia Bo.   ANESTHESIA:  Local with sedation.   TECHNIQUE:  The patient was taken to the operating room and sedated by  anesthesia.  The neck and upper chest were prepped and draped in usual  sterile fashion.  After the skin was infiltrated with 1% lidocaine, left IJ  was cannulated and a guidewire introduced into the superior vena cava under  fluoroscopic control.  The tract over the wire was dilated and the dilator  and peel-away sheath were passed over the wire and wire and dilator removed.  The catheter was passed through the peel-away sheath and positioned in the  right atrium.  The tract over the wire was dilated. The dilator and peel-  away sheath were passed over the wire and dilator was removed.  The catheter  was passed through the peel-away sheath and positioned in the right atrium.  The exit site for the catheter was selected and the skin anesthetized  between the two areas. The catheter was then brought through the tunnel, cut  to the appropriate length, and the distal ports were attached.  Both ports  withdrew easily, were then flushed with heparinized saline and filled with  concentrated heparin.  The catheter was secured at its exit site with a 3-0  nylon suture.  The IJ cannulation site was closed with a 4-0 subcuticular  stitch.  Sterile dressing was applied.  The patient tolerated procedure well  and was transferred to recovery  room in satisfactory condition.  All needle  and sponge counts were correct.      Di Kindle. Edilia Bo, M.D.  Electronically Signed     CSD/MEDQ  D:  05/16/2006  T:  05/16/2006  Job:  [id number]

## 2010-11-19 NOTE — Op Note (Signed)
NAME:  Justin [name], Justin [name]                          ACCOUNT NO.:  [id number]   MEDICAL RECORD NO.:  [id number]                   PATIENT TYPE:  OIB   LOCATION:  2859                                 FACILITY:  [facility]   PHYSICIAN:  Di Kindle. Edilia Bo, M.D.        DATE OF BIRTH:  1956-04-15   DATE OF PROCEDURE:  01/16/2004  DATE OF DISCHARGE:                                 OPERATIVE REPORT   PREOPERATIVE DIAGNOSIS:  Clotted left upper arm arteriovenous graft.   POSTOPERATIVE DIAGNOSIS:  Clotted left upper arm arteriovenous graft.   PROCEDURE:  1. Thrombectomy of left upper arm AV graft.  2. Insertion of new segment of graft to more proximal axillary vein.  3. Intraoperative fistulogram.  4. Exploration of arterial anastomosis for retained clot.   SURGEON:  Di Kindle. Edilia Bo, M.D.   ASSISTANT:  Shonna Chock, P.A.   ANESTHESIA:  Local with sedation.   TECHNIQUE:  The patient was taken to the operating room and sedated by  anesthesia.  The left upper extremity was prepped and draped in the usual  sterile fashion.  After the skin was infiltrated with 1% lidocaine, the  incision over the venous anastomosis was opened and the venous limb of the  graft dissected free.  There was marked intimal hyperplasia at the venous  anastomosis, and I dissected the more proximal vein high up in the axilla.  The patient was then heparinized.  The graft was divided, and a #5  thrombectomy catheter was used to perform thrombectomy of the graft.  Excellent inflow was established to the graft, and the graft was flushed  with heparinized saline and clamped.  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  The  patient stated that this graft had been in for 5 years, he believes.  A new  segment of 7 mm __________ was brought to the field.  The more proximal  axillary vein was spatulated.  Of note, there was intimal hyperplasia along  the posterior  wall which extended up very high, and I could not get above  this.  The 7 mm graft was spatulated and sewn end-to-end to the more  proximal vein using continuous 6-0 Prolene suture.  The graft was then  pulled to the appropriate length, then anastomosed end-to-end to the old  graft using continuous 6-0 Prolene suture.  At the completion, there was a  good thrill in the graft, although it was slightly pulsatile.  Hemostasis  was obtained in the wound, and I did shoot an intraoperative arteriogram  which showed some retained clot at the arterial end.  I therefore made an  incision over the arterial end after the skin was anesthetized, and here the  retained clot was directly retrieved, and the arterial anastomosis was  inspected and was widely patent.  I also passed the catheter to the graft  several more times, and no  further retained clot was retrieved.  The  graftotomy was closed with 6-0 Prolene suture.  There was a good thrill in  the graft.  Hemostasis was obtained in the wounds.  The wounds were closed  with a deep layer of 3-0 Vicryl and the skin closed with 4-0 Vicryl.  Sterile dressing was applied.  The patient tolerated the procedure well and  was transferred to the recovery room in satisfactory condition.  All needle  and sponge counts were correct.                                              Di Kindle. Edilia Bo, M.D.   CSD/MEDQ  D:  01/15/2004  T:  01/15/2004  Job:  [id number]

## 2010-11-19 NOTE — Op Note (Signed)
Justin [name], Justin [name]              ACCOUNT NO.:  [id number]   MEDICAL RECORD NO.:  [id number]          PATIENT TYPE:  OIB   LOCATION:  2899                         FACILITY:  [facility]   PHYSICIAN:  Balinda Quails, M.D.    DATE OF BIRTH:  1956/07/01   DATE OF PROCEDURE:  06/23/2004  DATE OF DISCHARGE:  06/23/2004                                 OPERATIVE REPORT   PREOPERATIVE DIAGNOSIS:  End stage renal failure.   POSTOPERATIVE DIAGNOSIS:  End stage renal failure.   OPERATION PERFORMED:  1.  Ultrasound right neck.  2.  Insertion of right internal jugular Diatek catheter.   SURGEON:  Balinda Quails, M.D.   ASSISTANT:  Nurse.   ANESTHESIA:  Local with MAC.   ANESTHESIOLOGIST:  Maren Beach, M.D.   DESCRIPTION OF PROCEDURE:  The patient was brought to the operating room in  stable condition.  Placed in supine position.  Right neck and chest prepped  and draped in the sterile fashion.  Ultrasound of the right neck was carried  out. The right internal jugular vein identified with normal compressibility  and respiratory variation.  Skin and subcutaneous tissue instilled with 1%  Xylocaine.  A needle was easily introduced into the right internal jugular  vein.  0.035 J-wire passed through the needle into the superior vena cava.  Needle removed.  Site opened with 11 blade.  12, 14, and 16 dilators  advanced over the guidewire.  A 16 dilator and a tear-away sheath advanced  over the guidewire,  the dilator and guidewire removed.  Catheter placed  through the sheath and the sheath removed.  Catheter positioned at the  superior vena cava right atrial junction.  A subcutaneous tunnel created.  The catheter brought through the tunnel.  The catheter divided and the hub  mechanism assembled.  The catheter flushed with heparin saline solution and  capped with heparin.  The catheter fixed to the skin with 2-0 silk suture.  The insertion site closed with interrupted 4-0 nylon.  Sterile  dressings  applied.  Transferred to the recovery room in stable condition.  There were  no apparent complications.       PGH/MEDQ  D:  06/23/2004  T:  06/24/2004  Job:  [id number]

## 2010-11-19 NOTE — Op Note (Signed)
NAME:  Justin [name], Justin [name]              ACCOUNT NO.:  [id number]   MEDICAL RECORD NO.:  [id number]          PATIENT TYPE:  AMB   LOCATION:  SDS                          FACILITY:  [facility]   PHYSICIAN:  Charles E. Fields, MD  DATE OF BIRTH:  [birth year]   DATE OF PROCEDURE:  01/21/2006  DATE OF DISCHARGE:                                 OPERATIVE REPORT   PROCEDURE:  Thrombectomy and revision of left upper arm arteriovenous graft.   PREOPERATIVE DIAGNOSIS:  Thrombosed arteriovenous graft.   POSTOPERATIVE DIAGNOSIS:  Thrombosed arteriovenous graft.   ANESTHESIA:  Local with IV sedation.   ASSISTANT:  Jess Barters, R.N.   OPERATIVE FINDINGS:  Narrowing of venous anastomosis.   OPERATIVE DETAILS:  After obtaining informed consent, the patient was taken  to the operating room.  The patient was placed in supine position on the  operating table.  Next the patient's entire left upper extremity was prepped  and draped in the usual sterile fashion.  Local anesthesia was infiltrated  at a preexisting longitudinal scar up in the axilla on the left arm.  The  incision was reopened, carried down through the subcutaneous tissues down to  the level of the graft.  The graft was dissected free circumferentially.  Dissection was carried up to the level of the venous anastomosis.  The vein  was obviously thickened from the outside in this location.  Dissection was  carried further up into the axilla and it was very high where the vein  became normal in appearance again.  At this point the vein was dissected  free circumferentially.  The patient was given 5000 units of intravenous  heparin.  A transverse graftotomy was made just above the level of the  venous anastomosis.  Number 4 Fogarty catheter was used to thrombectomize  the vein until there was good venous backbleeding.  A #3 and a #4 Fogarty  catheter were then passed down the arterial limb of the graft, multiple  passes, until all thrombotic  material was removed and the arterialized plug  was retrieved.  Next the graft was clamped proximally.  The venous  anastomosis was taken down.  The vein was spatulated further up.  A 6 mm  interposition graft was brought up on the operative field, beveled and sewn  end-to-end to the axillary vein using a running 6-0 Prolene suture.  At  completion of the anastomosis this was thoroughly flushed with heparinized  saline and reclamped.  The graft was then cut to length and sewn end-to-end  to the old graft using a running 6-0 Prolene suture.  Just prior to  completion of the anastomosis, this was forebled, backbled and thoroughly  flushed.  Anastomosis was secured, clamps were released.  There was a  palpable thrill in the graft immediately.  The patient had also a radial  Doppler signal.  Hemostasis was obtained with thrombin and Gelfoam and 50 mg  of protamine.  The subcutaneous tissue was reapproximated using running 3-0  Vicryl suture.  The skin was closed with a 4-0 Vicryl subcuticular stitch.  The patient  tolerated the procedure  well and there were no known complications.  Instrument, sponge and needle count was correct at the end of the case.  The  patient taken to the recovery room in stable condition.      Janetta Hora. Fields, MD  Electronically Signed     CEF/MEDQ  D:  01/21/2006  T:  01/21/2006  Job:  [id number]

## 2010-11-19 NOTE — Assessment & Plan Note (Signed)
[facility]   NAME:  Justin [name], Justin [name]              ACCOUNT NO.:  [id number]   MEDICAL RECORD NO.:  [id number]            DATE OF BIRTH:   PHYSICIAN:  Theresia Majors. Tanda Rockers, M.D. VISIT DATE:  07/11/2006                                   OFFICE VISIT   VITAL SIGNS:  Blood pressure is 119/67, respirations 20, pulse rate 87,  temperature is 98.4.   PURPOSE OF TODAY'S VISIT:  [name] is a 55 year old patient who we  follow for Wagner grade II diabetic foot ulcer.  We have been treating  him with total contact cast.  In in the interim, he denies pain, malodor  or drainage.   WOUND EXAM:  Inspection of the left foot shows that the ulceration is  completely granulated.  There has been significant contraction related  to the adequate off loading.  The x-ray shows no evidence of  osteomyelitis and the culture is outstanding.   DIAGNOSIS:  Clinical response to off loading.   MANAGEMENT PLAN & GOAL:  We will continue total contact cast.  We will  add a Silvalon silver dressing with a soft sorb.  We will reevaluate the  patient in 2 weeks.           ______________________________  Theresia Majors Tanda Rockers, M.D.     [name]  D:  07/11/2006  T:  07/12/2006  Job:  [id number]

## 2010-11-19 NOTE — Discharge Summary (Signed)
[place]  Patient:    Justin [name], Justin [name] Visit Number: [id number] MRN: [id number]          Service Type: MED Location: [phone] Attending Physician:  Maree Krabbe Dictated by:   Ebbie Ridge, M.D. Admit Date:  08/11/2001 Discharge Date: 08/13/2001   CC:         [facility] on [facility]   Discharge Summary  DISCHARGE DIAGNOSES: 1. Gastroenteritis secondary to antibiotic adverse reaction, diabetic    gastroparesis, versus viral gastroenteritis, now resolved. 2. Orthostatic hypotension. 3. End-stage renal disease, hemodialysis Tuesday, Thursday, Saturday at    [facility]. 4. Type I diabetes mellitus. 5. Hypercholesterolemia.  DISCHARGE MEDICATIONS: 1. Insulin 70/30, 10 units b.i.d. 2. Zocor 20 mg p.o. q.d. 3. Calcium carbonate 3 tablets with meals t.i.d. 4. Nephro-vite 1 p.o. q.d. 5. Famotidine 20 mg p.o. q.d. 6. Keflex discontinued on day eight of ten.  DIET:  Renal and diabetic diet as before admission, restrict fluids to less than 1500 ml per day.  PROCEDURE:  Gastric emptying study, results pending at the time of discharge.  HISTORY OF PRESENT ILLNESS:  Briefly, the patient is a 55 year old African-American male who presented to the emergency room via EMS complaining of intractable vomiting for the past seven hours.  He reported nausea with occasional vomiting over the previous three days.  Several hours before admission he noticed some coffee ground appearing emesis.  He denied any melena or hematochezia.  He denies diarrhea or constipation, fever, chills, foreign travel, or unusual foods.  He did complain of upper respiratory infection symptoms for the past one to two weeks.  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  He denied a history of peptic ulcer disease or GI bleed.  He denies alcohol use.  He did admit to taking NSAIDs occasionally as  needed for leg pain and had been started on a course of Keflex 500 mg b.i.d. on January 30th for a skin infection on his head.  HOSPITAL COURSE: 1 - Nausea and vomiting:  Although there was coffee ground emesis on admission, the patient had no overt GI bleed, and rectal exam was heme-negative.  His hemoglobin was 15.8 on admission and 13.9 on the day of discharge.  His nausea and vomiting resolved on the night of admission after IV Zofran, Protonix, and Reglan were given.  Was advanced to clear liquids the following morning.  The patient never complained of abdominal pain and his abdominal exam was normal.  Given the absence of pain, a normal white blood count, and the quick resolution of symptoms, his nausea and vomiting were presumed secondary to antibiotic side effect versus a viral gastritis. However, a gastric emptying scan was done in the hospital to rule out diabetic gastroparesis as a contributing factor.  These results are pending at the time of discharge.  If gastric emptying is delayed, consider starting the patient on Reglan as an outpatient.  2 - Orthostatic hypotension:  The patient was noted to have a significant drop in systolic blood pressure from 150 to 95 from lying to standing, with no change in pulse.  He did complain of some dizziness with sitting and standing initially.  This was discussed with the charge nurse at [facility], who reported this is a chronic problem of the patients.  With this degree of orthostatic hypotension, antihypertensives should be avoided and dry weight should not be lowered without checking  a standing blood pressure.  3 - End-stage renal disease:  The patient gets hemodialysis at the [facility] on Tuesday, Thursday and Saturday.  He had a treatment on the day of admission.  He was not dialyzed during his stay.  4 - Type I diabetes mellitus:  Blood sugars were well controlled on a diabetic diet and 1/2 of the  patients home dose of insulin.  FOLLOWUP ISSUES: 1. Gastric emptying study results are pending at the time of discharge.    Reglan can be added if emptying time is delayed. 2. Keflex was stopped during hospital admission due to possible adverse effect    of nausea and vomiting.  The lesion on the patients posterior scalp will    need to be checked for resolution of infection. 3. Avoid antihypertensives in this patient and do not lower dry weight on    dialysis without checking his standing blood pressure due to his    significant degree of orthostatic hypotension.  cc:  [facility] on [address] Dictated by:   Ebbie Ridge, M.D. Attending Physician:  Maree Krabbe DD:  08/13/01 TD:  08/13/01 Job: [id number]

## 2010-11-19 NOTE — Assessment & Plan Note (Signed)
[facility]   NAME:  Justin [name], Justin [name]              ACCOUNT NO.:  [id number]   MEDICAL RECORD NO.:  [id number]      DATE OF BIRTH:  01/08/1956   PHYSICIAN:  Theresia Majors. Tanda Rockers, M.D. VISIT DATE:  07/05/2006                                   OFFICE VISIT   VITAL SIGNS:  Blood pressure is 130/80, respirations 16, pulse rate 78,  temperature 97.7.  Capillary blood glucose is 100 mg percent.   PURPOSE OF TODAY'S VISIT:  [name] is a 55 year old diabetic who  returns for followup of an ulceration over the fifth met head.  In the  interim, we have used a CAM walker for offloading.  He represents  complaining of some pain over the previous area.  He denies fever or  drainage.   WOUND EXAM:  Inspection of the foot shows that the pulse remains  palpable.  There is a mucopurulent drainage extruding from the center of  a callused area at the head of the left fifth met.   DIAGNOSIS:  Wagner grade III diabetic foot ulcer.   TREATMENT:  We did a full thickness debridement with debridement away of  friable tissue.  Hemorrhage was controlled with Silver nitrate.  The  wound extended down to the periosteum.   MANAGEMENT PLAN & GOAL:  We are proceeding with a plain x-ray of the  foot to confirm the presence of changes consistent of osteomyelitis.  We  have cultured this wound.  We will place him on antibiotics pending the  culture report.  In the interim, we will place him in a total contact  cast and we will reevaluate this in 1 week.           ______________________________  Theresia Majors. Tanda Rockers, M.D.     [name]  D:  07/05/2006  T:  07/05/2006  Job:  [id number]   cc:   [facility]

## 2010-11-19 NOTE — Cardiovascular Report (Signed)
NAMEFERNADO, Justin [name]              ACCOUNT NO.:  [id number]   MEDICAL RECORD NO.:  [id number]          PATIENT TYPE:  INP   LOCATION:  5502                         FACILITY:  [facility]   PHYSICIAN:  Salvadore Farber, MD  DATE OF BIRTH:  [birth year]   DATE OF PROCEDURE:  06/26/2006  DATE OF DISCHARGE:                            CARDIAC CATHETERIZATION   PROCEDURES:  1. Left heart catheterization.  2. Left ventriculography.  3. Coronary angiography.   INDICATIONS:  [name] is a 55 year old gentleman with longstanding  hypertension and diabetes with 10 years of end-stage renal disease on  hemodialysis.  He presents with vague chest discomfort and new T-wave  inversions in the inferoapical leads.  Due to his multiple risk factors  and abnormal electrocardiogram, we recommended cardiac catheterization.  He presents today.   PROCEDURAL TECHNIQUE:  Informed consent was obtained.  Under 1%  lidocaine local anesthesia, a 5-French sheath was placed in the right  common femoral artery using the modified Seldinger technique.  Diagnostic angiography and ventriculography were performed using JL-4,  JR-4, and pigtail catheters.  The patient tolerated the procedure well  and was transferred to the holding room, where sheath will be removed.   COMPLICATIONS:  None.   FINDINGS:  1. LV:  171/15/22.  EF 60% with mild anterior hypokinesis.  2. No aortic stenosis or mitral regurgitation.  3. Left main:  Angiographic   Dictation ended at this point.      Salvadore Farber, MD     WED/MEDQ  D:  06/26/2006  T:  06/26/2006  Job:  [id number]   cc:   Mindi Slicker. Lowell Guitar, M.D.

## 2010-11-19 NOTE — Op Note (Signed)
NAME:  Justin [name], Justin [name]              ACCOUNT NO.:  [id number]   MEDICAL RECORD NO.:  [id number]          PATIENT TYPE:  AMB   LOCATION:  SDS                          FACILITY:  [facility]   PHYSICIAN:  Larina Earthly, M.D.    DATE OF BIRTH:  [birth year]   DATE OF PROCEDURE:  05/11/2006  DATE OF DISCHARGE:  05/11/2006                                 OPERATIVE REPORT   PREOPERATIVE DIAGNOSIS:  End-stage renal disease with occluded right forearm  loop arteriovenous Gore-Tex graft.   POSTOPERATIVE DIAGNOSIS:  End-stage renal disease with occluded right  forearm loop arteriovenous Gore-Tex graft.   PROCEDURE:  1. Thrombectomy.  2. Revision of arterial and venous anastomosis with a jump graft.  3. Revision of 7-mm Gore-Tex to an above-elbow brachial vein.   SURGEON:  Larina Earthly, MD   ASSISTANT:  Stephanie Acre Dominick, PA-C   ANESTHESIA:  MAC.   COMPLICATIONS:  None.   CONDITION TO RECOVERY:  Stable.   PROCEDURE IN DETAIL:  The patient was taken to operating room, placed in the  supine position, the area of the right arm prepped and draped in usual  sterile fashion.  He had a recent thrombolysis and angioplasty with the  interventional radiologist.  He had a reocclusion and is now taken to the  operating room for surgical thrombectomy and revision.  The graft was  exposed, and there was a tight stenosis of the brachial vein anastomosis.  There was extensive scarring in this region, as well.  The brachial artery  graft anastomosis was also exposed.  The graft was opened near the venous  anastomosis, catheter passed through this region, and there was poor  arterial flow.  For this reason, the arterial anastomosis was further  exposed, and the artery proximally and distally to this was exposed.  The  artery was occluded proximally and distally, and the old arterial  anastomosis was totally taken down.  There was some adherent thrombus that  was removed, and also some stenosis at the  thrombolysis access site.  A 5  dilator was. Passed through this area and actually dislodged the more  organized thrombus in the graft wall.  This was re-thrombectomized and  flushed.  The arterial anastomosis was resewn with a running 6-0 Prolene  suture.  Next, an incision was made using local anesthesia above the elbow  over the level of the basilic vein.  The vein was opened and was sclerotic  throughout its course, and was unacceptable for outflow.  A brachial vein  was exposed through the same incision and was of good caliber.  A new  interposition graft of 7-mm Gore-Tex was brought into the field.  The  brachial vein was occluded proximally and distally.  It was opened with an  11-blade and extended with Potts scissors.  The graft was spatulated and  sewn end to side to the brachial vein with a running 6-0 Prolene suture.  This flushed open and then reoccluded.  Next, the graft was cut to  appropriate length and was tunneled down to the level of the prior venous  limb  of the graft.  This was sewn end-to-end to the old venous limb with a  running 6-0 Prolene  suture.  Clamps were removed, and good thrill was noted.  The wound was  irrigated with saline.  Hemostasis was obtained with electrocautery.  Wound  was closed with 3-0 Vicryl in the subcutaneous, subcuticular tissue.  Mini  Steri-Strips were applied.  This was well tolerated.      Larina Earthly, M.D.  Electronically Signed     TFE/MEDQ  D:  05/11/2006  T:  05/12/2006  Job:  [id number]

## 2010-11-19 NOTE — Assessment & Plan Note (Signed)
[facility]                         GASTROENTEROLOGY OFFICE NOTE   NAME:Garmany, NORBERTO WISHON                     MRN:          [id number]  DATE:07/31/2006                            DOB:          [birth year]    Mr. Bilton is referred back to see me by Dr. Darrick Penna for intermittent  hematochezia.  He states he had several episodes of hematochezia during  hospitalization in December of 2007.  He also notes bright red blood per  rectum that occurs intermittently when he is constipated.  He has not  noted any other gastrointestinal problems.  He was recently prescribed  sorbitol for p.r.n. usage which as helped his constipation.  He  underwent colonoscopy and upper endoscopy for anemia, reflux symptoms  and hematochezia in March of 2007 which showed internal hemorrhoids,  gastritis and GERD.  His reflux symptoms are under good control on  famotidine.   MEDICATIONS LISTED IN THE CHART:  Updated and reviewed.   MEDICATION ALLERGIES:  AMOXICILLIN.   EXAM:  No acute distress.  Weight 234 pounds.  Blood pressure is 160/90,  pulse 72 and regular.  CHEST:  Clear to auscultation bilaterally.  CARDIAC:  Regular rate and rhythm without murmurs.  ABDOMEN:  Soft and nontender with normoactive bowel sounds.  No palpable  organomegaly, masses or hernias.  RECTAL:  No external or internal lesions.  There is a scant amount of  yellow-brown Hemoccult negative stool in the vault.   ASSESSMENT AND PLAN:  Intermittent hematochezia associated with  constipation.  He appears to have functional constipation with  intermittent hemorrhoidal bleeding.  He is given standard instructions  for management of constipation.  He is advised to use sorbitol on a  regular schedule.  May use Anusol HC suppositories PR daily p.r.n.  I  will see back on referral.     Venita Lick. Russella Dar, MD, [facility]  Electronically Signed    MTS/MedQ  DD: 07/31/2006  DT: 07/31/2006  Job #: [id number]   cc:    Fayrene Fearing L. Deterding, M.D.

## 2010-11-19 NOTE — Consult Note (Signed)
NAME:  Justin [name], Justin [name]              ACCOUNT NO.:  [id number]   MEDICAL RECORD NO.:  [id number]          PATIENT TYPE:  INP   LOCATION:  5502                         FACILITY:  [facility]   PHYSICIAN:  Salvadore Farber, MD  DATE OF BIRTH:  May 25, 1956   DATE OF CONSULTATION:  DATE OF DISCHARGE:                                 CONSULTATION   REASON FOR CONSULTATION:  I was asked by Dr. Lowell Guitar to see Mr. Hellard  for chest pain and abnormal electrocardiogram.   HISTORY OF PRESENT ILLNESS:  [name] is a 55 year old gentleman with  end-stage renal disease, type 2 diabetes mellitus for more than 30 years  and hypercholesterolemia.  He reported at having ejection fraction of  30% in 1997, though details of that are not available.   [name] states that over the past week or two, he has had multiple  episodes daily of left-sided chest discomfort.  It typically lasts a few  minutes and resolves spontaneously.  He relates it to the initiation of  lisinopril.  There has been no radiation, associated dyspnea,  palpitations, nausea, or diaphoresis.   PAST MEDICAL HISTORY:  1. End-stage renal disease on hemodialysis for 10 years.  2. Type 2 diabetes mellitus since age 53.  3. Hypercholesterolemia.  4. Peptic ulcer disease.  5. Distant history of substance abuse.  6. Left foot ulcer recently healed  7. Ejection fraction of 30% in 1997.   ALLERGIES:  PENICILLIN.   CURRENT MEDICATIONS:  1. Aspirin 81 mg per day.  2. PhosLo t.i.d.  3. Nephro-Vite.  4. Colace.  5. Reglan.  6. Alprazolam 0.5 mg before dialysis.  7 . Lisinopril 40 mg q.h.s.  1. Simvastatin 80 mg per day.  2. Felodipine 20 mg per day.   SOCIAL HISTORY:  The patient lives alone in [place].  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  He has two grown daughters.  He  smoked previously 40 pack years, but quit 10 years ago.  Follows  diabetic diet.  Previously used cocaine but has used none in 8 years.  Denies  other illicit drug use and alcohol use.  Gets no exercise.   FAMILY HISTORY:  Mother died of myocardial infarction and 72.  Father  was murdered.  A brother died as a complication of surgery.   REVIEW OF SYSTEMS:  Oliguria and alternating constipation and diarrhea.  Occasional reflux symptoms.  Review of systems otherwise negative in  detail except as above.   PHYSICAL EXAMINATION:  GENERAL:  Well-appearing, in no distress.  VITAL SIGNS:  Heart rate 84, blood pressure 116/59, oxygen saturation  99% on room air.  He is afebrile, respiratory rate 16.  SKIN:  Exam is remarkable for the absence of xanthelasma.  It is normal.  MUSCULOSKELETAL:  Exam is normal.  HEENT is normal.  He has no jugular venous distension, thyromegaly,  lymphadenopathy.  LUNGS:  Respiratory effort is normal.  Lungs are clear to auscultation.  HEART:  He has a nondisplaced point maximal cardiac impulse.  There is a  regular rate and rhythm without murmur, rub,  or gallop.  ABDOMEN:  Soft, nondistended, nontender.  There is no  hepatosplenomegaly.  Bowel sounds normal.  EXTREMITIES:  Warm without clubbing, cyanosis, or edema.  The left foot  is in a boot.    He has a catheter in his right chest.   STUDIES:  Electrocardiogram demonstrates normal sinus rhythm at 84 beats  per minute with T-wave inversions inferoapically, which are new compared  with May 16, 2006.   LABORATORY STUDIES:  Notable for troponin of 0.08, CK-MB 6.0, a  potassium of 5.7.   IMPRESSION/RECOMMENDATIONS:  1. Unstable angina versus non-ST elevation myocardial infarction.      Symptoms are fairly vague.  However, new electrocardiographic      abnormalities are concerning, particularly viewed in light of his      multiple risk factors.  We will therefore treat him aggressively      with aspirin, beta blocker, heparin, and defibrotide.  Will      continue the NitroPaste that Dr. Lowell Guitar initiated.  We will plan on      proceeding directly  with patient due to high pretest probability.      Will proceed directly to the cardiac catheterization on Monday.  2. End-stage renal disease.  3. Diabetes mellitus  4. Hypercholesterolemia:  Check fasting lipid profile and continue      simvastatin.   Thank you for the opportunity to participate in his care.      Salvadore Farber, MD  Electronically Signed     WED/MEDQ  D:  06/23/2006  T:  06/25/2006  Job:  [id number]   cc:   Mindi Slicker. Lowell Guitar, M.D.

## 2010-11-19 NOTE — Assessment & Plan Note (Signed)
[facility]   NAME:  Justin [name], Justin [name]              ACCOUNT NO.:  [id number]   MEDICAL RECORD NO.:  [id number]      DATE OF BIRTH:  12-28-1955   PHYSICIAN:  Theresia Majors. Tanda Rockers, M.D.      VISIT DATE:                                   OFFICE VISIT   SUBJECTIVE:  Justin [name] is a 55 year old man who we follow for Wagner  grade 2 diabetic foot ulcer.  In the interim he has been treated with  offloading periodic debridement and most recently he has procured custom  inserts and shoes.  He returns for follow up.  In the interim he denies  excessive pain, malodor or drainage.   OBJECTIVE:  Blood pressure is 142/75.  Respirations 18.  Pulse rate 82.  Temperature 98.2.  capillary blood glucose is 90.  Inspection of the  wound shows that it is completely resolved.   ASSESSMENT:  Resolved diabetic foot wound.  Respondent to adequate  offloading.   PLAN:  We are discharging the patient to be followed up by [facility] to continue his dialysis.  He will require frequent  inspection of his feet.  We have instructed the patient in routine  diabetic foot care.  He seems to appreciate that if he observes wounds  in his feet he will bring this to the attention of his primary care  physician and seek follow up in the wound center.           ______________________________  Theresia Majors. Tanda Rockers, M.D.     Justin [name]  D:  08/08/2006  T:  08/09/2006  Job:  [id number]   cc:   Mindi Slicker. Lowell Guitar, M.D.

## 2010-11-19 NOTE — Assessment & Plan Note (Signed)
[facility]   NAME:  Justin [name], Justin [name]              ACCOUNT NO.:  [id number]   MEDICAL RECORD NO.:  [id number]      DATE OF BIRTH:  [birth year]   PHYSICIAN:  Theresia Majors. Tanda Rockers, M.D.      VISIT DATE:                                   OFFICE VISIT   SUBJECTIVE:  Mr. Justin [name] returns for follow up of an ulceration on the  left metatarsal head.  We have off-loaded him with a CAM-walker in the  interim.  He reports no pain, no drainage, no malodor, no fever.   OBJECTIVE:  Blood pressure is 118/78, respirations 24, pulse rate 80,  and he is afebrile.  Capillary blood glucose was 130 mg %.  inspection  of the foot shows that the os is completely covered by epithelium.  There is no drainage.  There is no malodor.   ASSESSMENT:  Resolved wound plan.  We are going to leave the patient in  the CAM-walker for an additional week, and make it transition to his  custom insert and shoe.  We will reevaluate the patient in one week.           ______________________________  Theresia Majors. Tanda Rockers, M.D.     Justin [name]  D:  06/06/2006  T:  06/07/2006  Job:  [id number]

## 2010-11-19 NOTE — Discharge Summary (Signed)
[place]  Patient:    Justin [name], Justin [name] Visit Number: [id number] MRN: [id number]          Service Type: MED Location: [id number] [phone] Attending Physician:  Maree Krabbe Dictated by:   Anastasio Auerbach, M.D. Admit Date:  08/11/2001 Discharge Date: 08/13/2001   CC:         Justin [name], M.D.                           Discharge Summary  DISCHARGE DIAGNOSES: 1.  Fluid overload, no response to diuretics. 2.  Initiated hemodialysis. 3.  End-stage renal disease secondary to diabetes mellitus. 4.  Diabetes mellitus.     a.  Nephropathy.     b.  Retinopathy.     c.  Peripheral neuropathy with bilateral fifth toe         amputations. 5.  Anemia.     a.  Iron deficient and guaiac-positive stools, April 1998. 6.  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     a.  Ejection fraction 35% with mild mitral regurgitation,         November 1997. 7.  Cocaine abuse. 8.  Reflux symptoms. 9.  History of noncompliance.  DISCHARGE MEDICATIONS: 1.  Insulin 70/30 20 units q.a.m. 2.  Nephro-Vite 1 p.o. q.d. 3.  Iron sulfate 325 t.i.d. between meals. 4.  Zantac 150 mg q.d. 5.  Epogen 9000 units q.h.d. 6.  Ansaid 100 mg IV q.h.d. (1 of 10 given).  DISPOSITION AND FOLLOW-UP:  Justin [name] has dialyzed the last four days in a row, and his weight has gone from 107 kilograms to 91 kilograms.  He will begin his regularly scheduled hemodialysis at the [facility] on Tuesday, Thursday, Saturday at 11:45.  F80, BFR400 (may increase to 450), x 4 hours, 2K, variable sodium, EDW 90 kg (will need to be lowered eventually).  He should have his hemoglobin checked as an outpatient, given the documented iron deficiency anemia and guaiac-positive stools.  Will start Zantac empirically, and I suspect he will need an outpatient GI workup.  PROCEDURES:  Hemodialysis initiated, October 26, 1996.  CONSULTATIONS:  None.  HISTORY OF PRESENT ILLNESS:  Justin [name] is a  55 year old African-American gentleman with diabetes and chronic renal insufficiency as well as cocaine-induced cardiomyopathy.  He presented with progressive shortness of breath over the past one to two weeks, with inability to rest over the past 24 hours.  He was seen by Dr. Briant Cedar in November 1997, when he was at [facility] with pulmonary edema related to decreased kidney function.  At that time, his creatinine clearance was 48, and total urine protein was 12,947 mg.  At that time, his BUN and creatinine were 86 and 4.9.  ADMISSION MEDICATIONS: 1.  Hydralazine 25 mg b.i.d. 2.  Lasix 120 q.a.m. 3.  Zaroxolyn 5 mg q.a.m. 4.  Insulin 70/30 20 units q.a.m.  PHYSICAL EXAMINATION:  VITAL SIGNS:  Afebrile, blood pressure 169/99, heart rate 86, weight 107.6 kilograms.  Bilateral retinopathy with laser scars.  JVD.  Positive gallop. Grade 1/6 systolic murmur.  2+ prostate with brown guaiac-negative stool.  1 x 1 cm open sore on the left shin without evidence of overt infection.  No asterixis.  ADMISSION LABORATORY DATA:  Hemoglobin 10.9, MCV 84, WBC 3.9, platelets 278.  Urinalysis greater than 300 protein, negative LE, negative nitrites, less than 5 WBCs, 5-20 RBCs, no casts or crystals.  BUN 95, creatinine 5.9, NA 137, K 4.6, chloride 96, CO2 27, calcium 7.3, magnesium 2.2, total protein 6.1, albumin 3.2, total bilirubin 0.4, alkaline phosphatase 74.  Total iron 52, TIBC 272, percent saturation 19.  HIV antibody nonreactive.  Hepatitis B surface antigen negative.  Hepatitis C antibody nonreactive. Urine culture negative.  HOSPITAL COURSE:  #1 - VOLUME OVERLOAD:  We attempted to diurese Justin [name] with high dose Lasix at 120 IV q.6h. with the addition of Zaroxolyn. He was making about a liter of urine a day but was taking in two to three.  His overall body edema was not reducing.  He had a left upper arm fistula that had been placed in December 1997, which had matured and  was ready to use.  Because we were working with an impossible outpatient regimen and making very little headway, we opted to dialyze him for fluid reduction.  As stated previously, he went from 107 kilograms on admission to 91 kilograms upon discharge, with four days of hemodialysis.  He is set up to be followed at the [facility] on Tuesdays, Thursdays, Saturdays at 11:45.  He will need his blood flow rate and weight adjusted on an as-needed basis.  He received dietary and dialysis education.  #2 - DIABETES MELLITUS:  During his stay here, 20 units of insulin was a bit much, and his sugars ran somewhat low.  We put him on 17 units, and he ran from 150 to 250.  I suspect when he leaves here, his insulin requirement will go up and, therefore, I will place him back on the 20 units of subcutaneous each morning.  #3 - ANEMIA:  Justin [name] is at his baseline hemoglobin of around 10.  He does have documented iron deficiency as well as guaiac-positive stools.  I have placed him on an H2 blocker empirically, and he should have his hemoglobin checked as an outpatient, and possibly undergo GI workup.  We will leave this up to outpatient management.  #4 - COCAINE ABUSE:  Justin [name] had used cocaine within the two weeks of being admitted.  He had planned on attending an inpatient rehabilitation program at [facility] in June.  Being on hemodialysis will limit this option.  He is going to talk with his probation officer to see if anything can be worked out locally.  #5 - HEMATURIA:  Justin [name] did have 5-20 RBCs on his urinalysis upon admission.  This should be checked as an outpatient if it persists.  His last _____ was January 1997. Dictated by:   Anastasio Auerbach, M.D. Attending Physician:  Maree Krabbe DD:  10/28/96 TD:  10/28/96 Job: [id number]

## 2010-11-19 NOTE — Assessment & Plan Note (Signed)
[facility]   NAME:  Justin [name]              ACCOUNT NO.:  [id number]   MEDICAL RECORD NO.:  [id number]      DATE OF BIRTH:  1956-06-09   PHYSICIAN:  Theresia Majors. Tanda Rockers, M.D.      VISIT DATE:                                     OFFICE VISIT   SUBJECTIVE:  Justin [name] is a 55 year old diabetic who is also on chronic  hemodialysis.  [facility].  At  that time he had a resolved ulceration over a fifth metatarsal head.  He was  fitted for custom inserts in shoes and was discharged.  He has not had the  inserts suggested but it was noted that he had drainage on his white sock  and he was referred to [facility] for evaluation.   OBJECTIVE:  VITAL SIGNS:  Blood pressure 130/70.  Respirations 20.  Pulse  rate 82.  He is afebrile.  His capillary blood glucose is 117 mg%.  Inspection of the lower extremities shows that the pedal pulses remain  intact.  There is trace edema bilaterally.  On the left foot there is a  plantar ulceration involving the remnant of the fifth metatarsal head.  There is a thick eschar which was unroofed disclosing a purulent collection.  This was completely debrided and cleansed with sodium hypochlorite rinse and  sodium hypochlorite gel.   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   PLAN:  We have instructed the patient to have his custom inserts adjusted to  offload the remnant of this left fifth metatarsal head.  In the meantime, he  has been instructed to utilize daily diabetic foot hygiene, i.e., washing  with an antiseptic soap, thorough cleansing, and applying a dry dressing.  The patient advises that he is going to have his insert adjusted.  If he has  difficulty in arranging this, he is to call the office.  We may have to  place him in an interim Darco healing sandal with felt strips to offload  this area of increased pressure.  The patient seems to understand and  expresses gratitude for having been seen in the clinic and indicates that he  will comply as per above.           ______________________________  Theresia Majors. Tanda Rockers, M.D.     Justin [name]  D:  03/27/2006  T:  03/28/2006  Job:  [id number]   cc:   Fayrene Fearing L. Deterding, M.D.

## 2010-11-19 NOTE — Assessment & Plan Note (Signed)
[facility]   NAME:  Justin [name], DUBUC              ACCOUNT NO.:  [id number]   MEDICAL RECORD NO.:  [id number]      DATE OF BIRTH:  January 19, 1956   PHYSICIAN:  Theresia Majors. Tanda Rockers, M.D. VISIT DATE:  06/13/2006                                   OFFICE VISIT   VITAL SIGNS:  Blood pressure is 122/67, respirations 18, pulse rate 76,  and he is afebrile.  Capillary blood glucose is a 161 mg%   PURPOSE OF TODAY'S VISIT:  Mr. Manigo returns for followup of the  diabetic ulcer on the left lateral fifth metatarsal head.  In the  interim, he has been treated with the CAM walker for offloading.  The  patient does have custom shoes and inserts.  We asked him to bring them  with him for our assessment as to their adequacy for offloading.  In the  interim, he denies excessive drainage, malodor, pain, or fever.   WOUND EXAM:  Inspection of the wound shows that there is an area of  thickened callus as well as some necrosis toward the center.  The wound  underwent a full-thickness debridement with hemorrhage control with  direct pressure.   DIAGNOSIS:  Inadequate offloading.   MANAGEMENT PLAN & GOAL:  We will continue the CAM walker.  We have  inspected his shoes.  They appear to be inadequately offloading the  fifth metatarsal head.  We will return him to the orthotist for  adjustment of the shoe.  We will reevaluate him in 1 week.  In the  interim, we will utilize a Silverlon dressing and hydrogel for 72 hours;  thereafter, we will resort to continuing the CAM walker for the offload  with a white sock and daily antiseptic soap washings.           ______________________________  Theresia Majors Tanda Rockers, M.D.     [name]  D:  06/13/2006  T:  06/14/2006  Job:  [id number]

## 2010-11-19 NOTE — Assessment & Plan Note (Signed)
[facility]   NAME:  Justin [name], Justin [name]              ACCOUNT NO.:  [id number]   MEDICAL RECORD NO.:  [id number]      DATE OF BIRTH:  Aug 26, 1955   PHYSICIAN:  Theresia Majors. Tanda Rockers, M.D.      VISIT DATE:                                   OFFICE VISIT   VITAL SIGNS:  Blood pressure is 140/80, respirations 20, pulse rate 72,  and he is afebrile.  Capillary blood glucose was 100mg %.   PURPOSE OF TODAY'S VISIT:  [name] is a 55 year old man who we  followed for diabetic foot ulcer involving his left foot.  He has had  interim adjustments of his custom inserts.  In the meantime, he has worn  a CAM walker for complete offloading.  He reported that there has been  no excessive drainage, malodor, or fever.   WOUND EXAM:  Inspection of the foot shows that the wound is completely  resolved.   DIAGNOSIS:  Resolution of Wagner grade 2 diabetic foot ulcer.   MANAGEMENT PLAN & GOAL:  We will keep the patient in the CAM walker  until his custom shoe is completed.  We will reevaluate him at the end  of 2 weeks.           ______________________________  Theresia Majors Tanda Rockers, M.D.     Justin [name]  D:  06/20/2006  T:  06/21/2006  Job:  [id number]

## 2010-11-19 NOTE — Op Note (Signed)
NAME:  Justin [name], Justin [name]              ACCOUNT NO.:  [id number]   MEDICAL RECORD NO.:  [id number]          PATIENT TYPE:  AMB   LOCATION:  SDS                          FACILITY:  [facility]   PHYSICIAN:  Di Kindle. Edilia Bo, M.D.DATE OF BIRTH:  09-05-1955   DATE OF PROCEDURE:  01/27/2006  DATE OF DISCHARGE:                                 OPERATIVE REPORT   PREOPERATIVE DIAGNOSIS:  Chronic renal failure.   POSTOPERATIVE DIAGNOSIS:  Chronic renal failure.   PROCEDURE:  Ultrasound-guided placement of a right IJ Diatek catheter.   SURGEON:  Di Kindle. Edilia Bo, M.D.   ASSISTANT:  Nurse.   ANESTHESIA:  Local with sedation.   TECHNIQUE:  The patient was taken to the operating room and sedated by  anesthesia.  The ultrasound scanner was used to mark both internal jugular  veins, both of which appeared to be patent.  The neck and upper chest were  then prepped and draped in the usual sterile fashion.  After the skin was  infiltrated with 1% lidocaine, the right IJ was cannulated and a guidewire  introduced into the superior vena cava under fluoroscopic control.  The  tract over the wire was dilated, and then a dilator and peel-away sheath  were passed over the wire, and the wire and dilator removed.  The catheter  was passed through the peel-away sheath and positioned in the right atrium.  The exit site for the catheter was selected and the skin anesthetized  between the two areas,  The catheter was then brought through the tunnel,  cut to the appropriate length, and the distal ports were attached.  Both  ports withdrew easily, were then flushed with heparinized saline, and filled  with concentrated heparin.  The catheter was secured at its exit site with a  3-0 nylon suture.  The IJ cannulation site was closed with 4-0 subcuticular  stitch.  A sterile dressing was applied.  The patient tolerated the  procedure well and was transferred to the recovery room in satisfactory  condition.   All needle and sponge counts were correct.      Di Kindle. Edilia Bo, M.D.  Electronically Signed     CSD/MEDQ  D:  01/27/2006  T:  01/27/2006  Job:  [id number]

## 2010-11-19 NOTE — Op Note (Signed)
NAME:  EMMET, MESSER              ACCOUNT NO.:  [id number]   MEDICAL RECORD NO.:  [id number]          PATIENT TYPE:  OIB   LOCATION:  2899                         FACILITY:  [facility]   PHYSICIAN:  Balinda Quails, M.D.    DATE OF BIRTH:  11/19/1955   DATE OF PROCEDURE:  06/11/2004  DATE OF DISCHARGE:  06/11/2004                                 OPERATIVE REPORT   DIAGNOSES:  1.  End-stage renal failure.  2.  Poorly functioning left upper extremity arteriovenous graft.   PROCEDURE:  1.  Arch aortogram.  2.  Left subclavian arteriogram with left upper extremity runoff      arteriography.   ACCESS:  Right common femoral artery, 5 French sheath.   CONTRAST:  70 ml Visipaque.   COMPLICATIONS:  None apparent.   CLINICAL NOTE:  Justin [name] is a 55 year old male with a history of end-  stage renal failure.  He has had some difficulties with dialysis access.  Inflow into his graft is at times compromised.  He is brought to the  catheterization lab at this time to rule out a proximal arterial lesion.  Risks of the procedure were explained to the patient with the measure of  morbidity/mortality of 1-2%.   DESCRIPTION OF PROCEDURE:  The patient was brought to the operating room in  stable condition.  He was placed in the supine position.  Right groin was  prepped and draped in a sterile fashion.  Skin and subcutaneous tissue was  instilled with 1% Xylocaine.  A needle was easily induced in the right  common femoral artery.  A 0.035 Wholey guide wire was advanced through the  needle into the mid abdominal aorta.  The needle was removed.  A 5 French  sheath was advanced over the guide wire.  The dilator was removed and the  sheath flushed with heparin and saline solution.  A pigtail catheter was  then advanced over the guide wire to the ascending aorta.  A 40 degree  projection LAO arch aortogram obtained.  This revealed a widely patent  origin of the innominate, proximal right subclavian,  and right common  carotid arteries.  There was normal antegrade flow present to the right  vertebral artery.  The left common carotid artery was widely patent arising  from the aortic arch.  The proximal left subclavian artery was normal in  caliber.  The left vertebral artery revealed normal antegrade flow.   A guide wire exchange was then carried out for a JB1 catheter.  The JB1  catheter advanced across the origin of the left subclavian artery.  The  [facility] guide wire advanced out into the distal left subclavian artery,  beyond the takeoff of the vertebral and inferior mesenteric arteries.  Left  upper extremity arteriography was obtained.  The distal left subclavian  artery was normal in caliber.  The left axillary artery was widely patent.  Normal circumflex humeral branches were noted arising from the proximal left  brachial artery.  Early venous flow was noted secondary to the patent left  upper extremity arteriovenous graft.  Further runoff arteriography  obtained.  The proximal and mid left brachial artery were widely patent.  The brachial  artery to the elbow was widely patent.  The arterial anastomosis to the  arteriovenous graft was widely patent.  Degeneration of the arteriovenous  graft was noted.  There were two areas of distinct pseudoaneurysm formation  consistent with degeneration of this graft.  There was a stenosis noted at  the venous anastomosis.  This was estimated to be moderate in severity,  approximately 50%.   This completed the arteriogram procedure.  Guide wire reinserted and  catheter removed.  Right femoral sheath removed.  No apparent complications.  Total contrast 70 ml of Visipaque.   FINAL IMPRESSION:  1.  Widely patent left subclavian axillary and brachial arteries.  2.  Patent left upper arm arteriovenous graft.  3.  Degeneration of left upper arm arteriovenous graft with two areas of      distinct pseudoaneurysm formation.  4.  Moderate stenosis of  the venous anastomosis of the left upper extremity      arteriovenous graft.   DISPOSITION:  These results have been reviewed with the patient.  His chart  will be reviewed and dialysis record reviewed for possible further  intervention.       PGH/MEDQ  D:  06/11/2004  T:  06/12/2004  Job:  [id number]   cc:   Fayrene Fearing L. Deterding, M.D.  [address]  [place]  [place] [address]  Fax: [phone]   [facility]

## 2010-11-19 NOTE — H&P (Signed)
NAME:  Justin [name], Justin [name]              ACCOUNT NO.:  [id number]   MEDICAL RECORD NO.:  [id number]          PATIENT TYPE:  INP   LOCATION:  5502                         FACILITY:  [facility]   PHYSICIAN:  Alvin C. Lowell Guitar, M.D.  DATE OF BIRTH:  1956/01/08   DATE OF ADMISSION:  06/23/2006  DATE OF DISCHARGE:                              HISTORY & PHYSICAL   Mr. Mcglocklin is a 55 year old male with a history of end-stage renal  disease who receives dialysis at the [facility] on  Tuesday, Thursday, Saturday, who presents now with a one week history of  intermittent but progressive left sided chest pain, lasting up to 5  minutes at a time.  The pain is non-exertional, and not brought on by  any specific activity.  It is non-radiating, not associated nausea,  vomiting or diaphoresis.  The pain is relieved with specific movements,  particularly when he is lying in bed.  In the emergency room T wave  change were noted on electrocardiogram, therefore, the recommendation  for admission has occurred.   PAST HISTORY:  1. ESRD.  2. Diabetes mellitus.  3. History of left foot ulcer, now healed.  4. Peripheral vascular disease.  5. GERD.  6. Dyslipidemia.  7. Hypertension.  8. History of substance abuse.  9. Anemia.  10.History of AV Gore-Tex graft placement and currently using a      PermCath for hemodialysis.   SOCIAL HISTORY:  The patient lives alone. He smoked in the past, over 20  years.  Currently does not consume alcohol.  Prior history of substance  abuser including cocaine.  Works at the [employer] and has done  this for years now.   CURRENT MEDICATIONS:  1. Lisinopril 40 mg daily.  2. Sensipar 120 mg daily.  3. Xanax 0.5 mg before dialysis.  4. Zocor 80 mg daily.  5. PhosLo 5 reportedly with meals.  6. Famotidine 20 mg at bedtime.  7. Nephro-Vite daily.  8. Baby aspirin 81 mg daily.  9. Metoclopramide 10 mg q.a.c. and h.s.   PHYSICAL EXAMINATION:  VITAL SIGNS:   Blood pressure 116/59, repeat  133/69, heart rate is 75 to 85.  GENERAL:  He is a pleasant African American male who is moderately  obese.  HEENT: Atraumatic, normocephalic.  Extraocular movements are intact.  LUNGS:  Clear to auscultation.  No chest wall pain or tenderness  ABDOMEN:  Soft.  No hepatosplenomegaly.  EXTREMITIES:  No edema.  NEUROLOGIC:  No obvious focality.   Electrocardiogram from June 23, 2006, shows normal sinus rhythm with  T-wave inversions laterally in I, LV, III, IV, and V; also some mild T-  wave inversions in II, III, and F.  Electrocardiogram, done on May 16, 2006, revealed a NSR with T-wave inversions in I and L.  Potassium  today was 5.7.  Hemoglobin 4.2.  Troponin less than 0.05.   ASSESSMENT:  1. Chest pain not characteristic of angina but electrocardiogram      changes are present.  2. End-stage renal disease.   PLAN:  1. Rule out MI.  2. Hemodialysis in the  morning.  3. Cardiology to see.      Mindi Slicker. Lowell Guitar, M.D.  Electronically Signed     ACP/MEDQ  D:  06/23/2006  T:  06/24/2006  Job:  [id number]

## 2010-11-19 NOTE — Op Note (Signed)
NAME:  Justin [name], Justin [name]              ACCOUNT NO.:  [id number]   MEDICAL RECORD NO.:  [id number]          PATIENT TYPE:  OIB   LOCATION:  2899                         FACILITY:  [facility]   PHYSICIAN:  Balinda Quails, M.D.    DATE OF BIRTH:  1955/11/18   DATE OF PROCEDURE:  08/04/2004  DATE OF DISCHARGE:                                 OPERATIVE REPORT   SURGEON:  Denman George, MD.   ASSISTANT:  Jerold Coombe, P.A.   ANESTHETIC:  Local with MAC.   PREOPERATIVE DIAGNOSES:  1.  End-stage renal failure.  2.  Degenerated left upper arm arteriovenous graft.   POSTOPERATIVE DIAGNOSES:  1.  End-stage renal failure.  2.  Degenerated left upper arm arteriovenous graft.   PROCEDURE:  Insertion of new left upper arm arteriovenous graft.   OPERATIVE PROCEDURE:  The patient brought to the operating room stable  condition.  Placed in supine position.   Left arm prepped and draped in a sterile fashion.  Skin and subcutaneous  tissue instilled with 1% Xylocaine with epinephrine.   A longitudinal skin incision made through the left axilla.  Dissection  carried down through the subcutaneous tissue.  The thrombosed indwelling  graft was identified, followed down through an end-to-end anastomosis to a  large axillary vein.  The vein appeared to be of good quality.  The vein was  mobilized proximally.  The venous anastomosis taken down and the vein  trimmed proximally.  It was a large vein of good caliber and quality.   A second skin incision then made over the proximal arterial limb of the  graft.  Dissection carried down to free the graft from the subcutaneous  tissue.  The graft encircled with a vessel loop.  The graft divided  transversely.  It was thrombosed.  A short cuff of graft was left on the  artery, and this was thrombectomized with a 4 Fogarty catheter.  Excellent  inflow obtained.  The graft controlled with fistula clamp.   A new tunnel was then created medial to the  degenerative graft.  A 6 mm Gore-  Tex graft placed through the tunnel.  This was anastomosed end-to-end to the  arterial cuff using running 6-0 Prolene suture.  This  was also anastomosed  end-to-end to the axillary vein with running 6-0 Prolene suture.  Clamps  were removed.  Excellent flow present.  Adequate hemostasis obtained.  Sponge and instrument counts correct.   Subcutaneous tissue closed with running 3-0 Vicryl suture in two layers.  Skin closed 4-0 Monocryl.  Steri-Strips applied.   No apparent complications.  The patient transferred to the recovery room in  stable condition.      PGH/MEDQ  D:  08/04/2004  T:  08/04/2004  Job:  [id number]

## 2010-11-19 NOTE — Cardiovascular Report (Signed)
NAME:  Justin [name], Justin [name]              ACCOUNT NO.:  [id number]   MEDICAL RECORD NO.:  [id number]          PATIENT TYPE:  INP   LOCATION:  5502                         FACILITY:  [facility]   PHYSICIAN:  Salvadore Farber, MD  DATE OF BIRTH:  [birth year]   DATE OF PROCEDURE:  06/26/2006  DATE OF DISCHARGE:  06/26/2006                            CARDIAC CATHETERIZATION   PROCEDURE:  Left heart catheterization, left ventriculography, coronary  angiography.   INDICATIONS:  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  He presents with vague  chest discomfort and new T wave inversions in the inferoapical leads.  Due to his multiple risk factors and abnormal electrocardiogram in the  setting of chest discomfort, I have recommended cardiac catheterization.  He presents today for the procedure.   PROCEDURAL TECHNIQUE:  Informed consent was obtained.  Under 1%  lidocaine local anesthesia, a 5-French sheath was placed in the right  common femoral artery using the modified Seldinger technique.  Diagnostic angiography and ventriculography were performed using JL4,  JR4 and pigtail catheters.  The patient tolerated the procedure well.  He was transferred to the holding room where his sheath will be removed.   COMPLICATIONS:  None.   FINDINGS:  1. LV:  171/15/22.  EF 60% with mild anterior hypokinesis.  2. No aortic stenosis or mitral regurgitation.  3. Left main:  Angiographically normal.  4. LAD:  Large vessel giving rise to two moderate sized diagonals.  It      is angiographically normal.  5. Circumflex:  Large, dominant vessel.  It gives rise to three obtuse      marginals and a posterior descending artery.  The AV groove      circumflex has a 20% stenosis after the origin of the first      marginal.  6. RCA:  Small, nondominant vessel.  It is angiographically  normal.   IMPRESSION/RECOMMENDATIONS:  The patient has minimal atherosclerotic  coronary disease with normal left ventricular size and systolic  function.  There is no clear cardiac explanation for his chest  discomfort.  No further cardiac evaluation is indicated.      Salvadore Farber, MD  Electronically Signed     WED/MEDQ  D:  07/04/2006  T:  07/04/2006  Job:  [id number]

## 2010-11-19 NOTE — Consult Note (Signed)
NAME:  Justin [name], Justin [name]                          ACCOUNT NO.:  [id number]   MEDICAL RECORD NO.:  [id number]                   PATIENT TYPE:  REC   LOCATION:  FOOT                                 FACILITY:  [facility]   PHYSICIAN:  Jonelle Sports. Sevier, M.D.              DATE OF BIRTH:  [birth year]   DATE OF CONSULTATION:  02/16/2004  DATE OF DISCHARGE:                                   CONSULTATION   HISTORY:  This 55 year old black male is seen at the courtesy of Dr. Eliott Nine  for assistance with management of chronic calluses on the feet.   The patient has a long standing history of diabetes and has renal failure on  the basis of that and hypertension. He has been on hemodialysis since 1998.   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 This was  back in the early to mid 1990's.  Apparently he has done reasonably well  since that time using custom inserts but has recently let those get quite  worn out and now has developed calluses again on the tip of the second toe  on the right and also underlying the fifth distal metatarsal area on the  left.   He was seen some two weeks ago but a pediatrist and had limited paring done  on the callus at the fifth distal metatarsal area.   Dr. Eliott Nine has referred him here now for our evaluation and advised.   PAST MEDICAL HISTORY:  Notable for history of cocaine abuse and also history  of treated positive PPD in addition to his diabetes, hypertension and  chronic renal failure. He also apparently has had congestive heart failure  but maintains a reasonably good ejection fraction.  He is said to be  allergic to AMOXICILLIN.  His regular medications include Sensipar, PhosLo,  sorbitol, Benadryl, Zocor, aspirin, 70/30 insulin, calcimax, famotidine,  ibuprofen, Nephro-Vite, and Tums.   PHYSICAL EXAMINATION:  Examination today is limited to the distal lower  extremities. The patient's feet are characterized by the deformity of  bilateral fifth ray amputations which are well healed. There is no  significant edema, skin temperatures are upper normal and symmetrical,  pulses are everywhere palpable and remarkably good, monofilament testing  shows that there is some variability with a lack of protected sensation  throughout the right foot and more spotty on the left.   On the tip of the second toe on the right which has a slight clawed  configuration is a hardened callous without evidence of surrounding  infection.   Underlying the fifth distal metatarsal area on the left is a large  hemorrhagic callus with no active drainage.   DISPOSITION:  1. The patient is given a review of proper foot care and diabetes by video     with nurse and physician reinforcement.  2.  The need for new custom diabetic inserts for which the patient already     has a prescription is emphasized.  3. The callous on the plantar aspect of the right second toe tip is sharply     pared without incident.  4. The large plantar callus at the fifth distal metatarsal area is sharply     pared again without incident.  5. The area pared on the left foot is treated with an application of     Neosporin and a leave in pad placed there fore some protection while the     area is toughening up. He is advised to leave this in place for 4-5 days     and then change it to simply a wide Band-Aid.  6. The patient is given bag balm for generalized use of his feet and ankle     areas where there is considerable dry skin.  7. Followup visit to this clinic will be in 16 days, at which time the     patient is advised to bring all available inserts.                                               Jonelle Sports. Cheryll Cockayne, M.D.    RES/MEDQ  D:  02/16/2004  T:  02/16/2004  Job:  [id number]   cc:   Duke Salvia. Eliott Nine, M.D.  [address]  [place]  [place] [address]  Fax: [phone]

## 2010-11-19 NOTE — Op Note (Signed)
NAME:  Justin [name], Justin [name]              ACCOUNT NO.:  [id number]   MEDICAL RECORD NO.:  [id number]          PATIENT TYPE:  AMB   LOCATION:  SDS                          FACILITY:  [facility]   PHYSICIAN:  Charles E. Fields, MD  DATE OF BIRTH:  07/01/1956   DATE OF PROCEDURE:  07/10/2006  DATE OF DISCHARGE:                               OPERATIVE REPORT   PROCEDURE:  Right upper arm AV graft.   PREOPERATIVE DIAGNOSIS:  End-stage renal disease.   POSTOPERATIVE DIAGNOSIS:  End-stage renal disease.   ANESTHESIA:  Local with IV sedation.   ASSISTANT:  Nurse.   OPERATIVE FINDINGS:  1. 6 mm axillary vein end-to-end.  2. Thickened brachial artery.   OPERATIVE DETAIL:  After obtaining informed consent, the patient taken  the operating room.  The patient placed supine position operating table.  After adequate sedation, the patient's entire right upper extremity  prepped and draped usual sterile fashion.  Local anesthesia was  infiltrated just above the antecubital crease.  Longitudinal incision  made in this location, carried down through subcutaneous tissues and the  old forearm AV graft segment was dissected free circumferentially and  excised.  The brachial artery was dissected free circumferentially just  beneath this.  This was controlled proximally and distally with vessel  loops.  Next, local anesthesia was infiltrated up in the axilla.  Longitudinal incision made in this location, carried down through  subcutaneous tissues down to the level of the axillary vein.  The  axillary vein was dissected free circumferentially.  Small side branches  ligated and divided between silk ties.  Next a subcutaneous tunnel was  created connecting the upper arm incision of the lower arm incision and  a 4 to 7-mm tapered PTFE graft brought to the subcutaneous tunnel.  Vessel loops were pulled taut on the brachial artery after 2 minutes  circulation of 5000 units of heparin.  Longitudinal arteriotomy was  made  the brachial artery.  The graft slightly beveled and sewn end of graft  to side of artery using a running 6-0 Prolene suture.  Just prior  completion anastomosis this was fore bled, back bled and thoroughly  flushed.  Anastomosis was secured, graft was clamped in the upper arm  incision.  Next the distal vein was ligated with 2-0 silk tie.  This was  then transected and controlled proximally with a fine bulldog clamp.  The vein was opened longitudinally.  There was a valve in the mid  segment.  This was excised.  The graft was then cut to length and  beveled.  Graft was then sewn end-to-end to the vein using a running 6-0  Prolene suture.  Just prior to completion anastomosis this was fore  bled, back bled and thoroughly flushed.  Anastomosis was secured, clamps  released.  There was palpable thrill in the proximal graft immediately.  The patient had audible radial Doppler signal which augmented slightly  with compression of the graft.  Next, the patient was given 50 mg of  protamine.  Hemostasis was obtained.  Subcutaneous  tissues reapproximated with running 2-0 Vicryl suture.  Skin  of both  incisions closed with four Vicryl subcuticular stitch.  The patient  tolerated procedure well and there were no complications.  Sponge,  needle counts correct at end the case.  The patient taken to recovery  room in stable condition.      Janetta Hora. Fields, MD  Electronically Signed     CEF/MEDQ  D:  07/10/2006  T:  07/10/2006  Job:  [id number]

## 2010-11-19 NOTE — Op Note (Signed)
NAME:  Justin [name], Justin [name]              ACCOUNT NO.:  [id number]   MEDICAL RECORD NO.:  [id number]            PATIENT TYPE:   LOCATION:                                 FACILITY:   PHYSICIAN:  Larina Earthly, M.D.         DATE OF BIRTH:   DATE OF PROCEDURE:  02/17/2006  DATE OF DISCHARGE:                                 OPERATIVE REPORT   PREOPERATIVE DIAGNOSIS:  End-stage renal disease.   POSTOP DIAGNOSIS:  End-stage renal disease.   PROCEDURE:  Right forearm loop AV Gore-Tex graft placement.   SURGEON:  Larina Earthly, M.D.   ASSISTANT:  Theda Belfast, PA-C.   ANESTHESIA:  MAC.   COMPLICATIONS:  None.   DISPOSITION:  Discharged to recovery room stable.   PROCEDURE IN DETAIL:  The patient was taken to the operating room and placed  on the operating table where the right arm was prepped and draped in the  usual sterile fashion.  Incision made over the antecubital space, carried  down to isolate the brachial artery, and brachial vein.  The brachial vein  was of good caliber.  The basilic vein at the antecubital space was  occluded.  A separate incision was made over the distal forearm and a loop  configuration tunnel was created in the forearm.   A 6 mm standard wall graft was brought through the tunnel.  The brachial  vein was occluded proximally and distally and was opened with an 11-blade  and sewn down with Potts scissors.  The graft was spatulated and sewn end-to-  side to the vein with a running 6-0 Prolene suture.  Clamps were removed;  graft was flushed with heparinized saline and reoccluded.   Next, the brachial artery was occluded proximally and distally with fistula  clamps.  Small arteriotomy was created.  The graft was cut to appropriate  length, spatulated and sewn end-to-side to the artery with running 6-0  Prolene suture.  Clamps removed and an excellent thrill was noted.  The  wounds were irrigated saline.  Hemostasis was obtained with electrocautery.  Wound  was closed with 3-0 Vicryl in the subcutaneous and subcuticular  tissue.  Benzoin and Steri-Strips were applied.     Larina Earthly, M.D.  Electronically Signed    TFE/MEDQ  D:  02/17/2006  T:  02/17/2006  Job:  [id number]

## 2010-11-19 NOTE — Assessment & Plan Note (Signed)
[facility]   NAME:  Justin [name], Justin [name]              ACCOUNT NO.:  [id number]   MEDICAL RECORD NO.:  [id number]      DATE OF BIRTH:  07-09-1955   PHYSICIAN:  Theresia Majors. Tanda Rockers, M.D. VISIT DATE:  04/03/2006                                     OFFICE VISIT   SUBJECTIVE:  Justin [name] is a 55 year old man who is seen in follow-up for a  diabetic foot ulcer on the lateral aspect of the fifth met head.  He is  status post amputation of the fifth digit.  In the interim, he has had his  insert readjusted by [employer].   VITAL SIGNS:  His blood pressure is 142/80, respiratory rate 16, pulse rate  68, he is afebrile.  Capillary blood glucose is 150 mg%.   PURPOSE OF TODAY'S VISIT:   WOUND EXAM:  Inspection of the foot shows that the wound is decreased  significantly in diameter.  It is in an ovoid configuration with greatest  diameter of 0.4 cm.  There is healthy appearing granulation with scan serous  drainage.  There is no infection or evidence of cellulitis.   WOUND SINCE LAST VISIT:   CHANGE IN INTERVAL MEDICAL HISTORY:   DIAGNOSIS:   TREATMENT:   ANESTHETIC USED:   TISSUE DEBRIDED:   LEVEL:   CHANGE IN MEDS:   COMPRESSION BANDAGE:   OTHER:   ASSESSMENT:  Adequate offloading with secondary healing.   MANAGEMENT PLAN & GOAL:  We have instructed the patient to continue to wear  cotton white sock daily to utilize diabetic foot hygiene and to apply  topical Neosporin.  He is to inspect his feet daily and we will see him in  follow-up in one month.  If he notices increased drainage on his white  cotton sock, he is to call for an interim appointment.  The patient seems to  understand, expresses gratitude for having been seen in the clinic and  indicates that he would be compliant with the above.           ______________________________  Theresia Majors. Tanda Rockers, M.D.     [name]  D:  04/03/2006  T:  04/04/2006  Job:  [id number]   cc:   Dr. Darrick Penna

## 2011-01-07 ENCOUNTER — Encounter: Payer: Self-pay | Admitting: Cardiovascular Disease

## 2011-03-25 LAB — CBC
HCT: 28.7 — ABNORMAL LOW
HCT: 29 — ABNORMAL LOW
HCT: 34.6 — ABNORMAL LOW
HCT: 34.8 — ABNORMAL LOW
Hemoglobin: 10.3 — ABNORMAL LOW
Hemoglobin: 10.9 — ABNORMAL LOW
Hemoglobin: 11.3 — ABNORMAL LOW
Hemoglobin: 11.4 — ABNORMAL LOW
Hemoglobin: 9.3 — ABNORMAL LOW
MCHC: 32.3
MCHC: 32.8
MCHC: 32.8
MCHC: 32.9
MCHC: 33
MCV: 83.7
MCV: 84.2
MCV: 84.2
MCV: 84.7
MCV: 85.2
Platelets: 114 — ABNORMAL LOW
Platelets: 114 — ABNORMAL LOW
Platelets: 128 — ABNORMAL LOW
Platelets: 164
Platelets: 186
RBC: 3.69 — ABNORMAL LOW
RBC: 3.93 — ABNORMAL LOW
RBC: 4.1 — ABNORMAL LOW
RBC: 4.12 — ABNORMAL LOW
RBC: 4.15 — ABNORMAL LOW
RDW: 16.9 — ABNORMAL HIGH
RDW: 17.1 — ABNORMAL HIGH
RDW: 17.1 — ABNORMAL HIGH
RDW: 17.2 — ABNORMAL HIGH
WBC: 4
WBC: 4.3
WBC: 5.9
WBC: 6.1
WBC: 7.9
WBC: 9.6

## 2011-03-25 LAB — TSH: TSH: 0.601

## 2011-03-25 LAB — RENAL FUNCTION PANEL
Albumin: 2.1 — ABNORMAL LOW
CO2: 26
Calcium: 8.1 — ABNORMAL LOW
Calcium: 9.4
Chloride: 101
GFR calc Af Amer: >60
GFR calc Af Amer: >60
GFR calc non Af Amer: >60
Glucose, Bld: 282 — ABNORMAL HIGH
Phosphorus: 2.5
Potassium: 3.5
Potassium: 4.3
Sodium: 132 — ABNORMAL LOW
Sodium: 140

## 2011-03-25 LAB — COMPREHENSIVE METABOLIC PANEL
ALT: 144 — ABNORMAL HIGH
CO2: 25
Calcium: 9
Chloride: 102
GFR calc non Af Amer: >60
Glucose, Bld: 246 — ABNORMAL HIGH
Sodium: 131 — ABNORMAL LOW
Total Bilirubin: 0.3

## 2011-03-25 LAB — CATH TIP CULTURE: Culture: >100

## 2011-03-25 LAB — BASIC METABOLIC PANEL
BUN: 12
BUN: 6
CO2: 26
Calcium: 7.7 — ABNORMAL LOW
Calcium: 8.3 — ABNORMAL LOW
Calcium: 8.4
Chloride: 110
Chloride: 112
Creatinine, Ser: 0.67
Creatinine, Ser: 0.68
Creatinine, Ser: 0.71
Creatinine, Ser: 1.18
GFR calc Af Amer: >60
GFR calc Af Amer: >60
GFR calc Af Amer: >60
GFR calc non Af Amer: 45 — ABNORMAL LOW
GFR calc non Af Amer: >60
GFR calc non Af Amer: >60
Glucose, Bld: 102 — ABNORMAL HIGH
Potassium: 4
Sodium: 137
Sodium: 138
Sodium: 139

## 2011-03-25 LAB — BLOOD GAS, ARTERIAL
O2 Saturation: 99.1
Patient temperature: 98.6
TCO2: 23.8
pH, Arterial: 7.376

## 2011-03-25 LAB — CULTURE, BLOOD (ROUTINE X 2)
Culture: NO GROWTH
Culture: NO GROWTH

## 2011-03-25 LAB — TACROLIMUS, BLOOD
Tacrolimus Lvl: 2.1 — ABNORMAL LOW
Tacrolimus Lvl: 3.5 — ABNORMAL LOW

## 2011-03-25 LAB — CLOSTRIDIUM DIFFICILE EIA: C difficile Toxins A+B, EIA: NEGATIVE

## 2011-03-25 LAB — AMYLASE: Amylase: 62

## 2011-03-25 LAB — MAGNESIUM
Magnesium: 1.6
Magnesium: 1.8
Magnesium: 2

## 2011-03-25 LAB — CORTISOL: Cortisol, Plasma: 45.2

## 2011-03-25 LAB — B-NATRIURETIC PEPTIDE (CONVERTED LAB): Pro B Natriuretic peptide (BNP): 302 — ABNORMAL HIGH

## 2011-03-25 LAB — PHOSPHORUS: Phosphorus: 1.8 — ABNORMAL LOW

## 2011-03-25 LAB — LACTIC ACID, PLASMA: Lactic Acid, Venous: 2.3 — ABNORMAL HIGH

## 2011-03-25 LAB — PTH, INTACT AND CALCIUM: PTH: 630.7 — ABNORMAL HIGH

## 2011-03-28 LAB — RENAL FUNCTION PANEL
Albumin: 2.6 — ABNORMAL LOW
Albumin: 2.6 — ABNORMAL LOW
CO2: 28
Calcium: 10.1
Chloride: 103
GFR calc Af Amer: >60
GFR calc Af Amer: >60
GFR calc non Af Amer: >60
GFR calc non Af Amer: >60
Phosphorus: 2.3
Phosphorus: 2.7
Potassium: 4.4
Sodium: 134 — ABNORMAL LOW
Sodium: 135

## 2011-03-28 LAB — CBC
HCT: 34.1 — ABNORMAL LOW
HCT: 36.1 — ABNORMAL LOW
Hemoglobin: 11.3 — ABNORMAL LOW
Hemoglobin: 11.6 — ABNORMAL LOW
Hemoglobin: 11.7 — ABNORMAL LOW
Hemoglobin: 12.4 — ABNORMAL LOW
MCHC: 33.2
MCHC: 32.7
MCV: 85.4
MCV: 83.7
Platelets: 134 — ABNORMAL LOW
Platelets: 358
RBC: 4.17 — ABNORMAL LOW
RBC: 4.25
RBC: 4.51
RDW: 16.8 — ABNORMAL HIGH
WBC: 7.1
WBC: 5.1

## 2011-03-28 LAB — COMPREHENSIVE METABOLIC PANEL
ALT: 1383 — ABNORMAL HIGH
Alkaline Phosphatase: 159 — ABNORMAL HIGH
CO2: 24
GFR calc non Af Amer: 45 — ABNORMAL LOW
Glucose, Bld: 188 — ABNORMAL HIGH
Potassium: 3.8
Sodium: 135

## 2011-03-28 LAB — I-STAT 8, (EC8 V) (CONVERTED LAB)
BUN: 11
Bicarbonate: 30.8 — ABNORMAL HIGH
Chloride: 104
Chloride: 103
Glucose, Bld: 75
HCT: 40
HCT: 42
Hemoglobin: 13.6
Hemoglobin: 14.3
Potassium: 4.1
Sodium: 137
pCO2, Ven: 41.6 — ABNORMAL LOW
pH, Ven: 7.44 — ABNORMAL HIGH

## 2011-03-28 LAB — DIFFERENTIAL
Basophils Absolute: 0
Basophils Relative: 0
Eosinophils Absolute: 0
Lymphs Abs: 0 — ABNORMAL LOW
Monocytes Absolute: 0 — ABNORMAL LOW
Monocytes Relative: 1 — ABNORMAL LOW
Monocytes Relative: 1 — ABNORMAL LOW
Neutro Abs: 1.2 — ABNORMAL LOW
Neutrophils Relative %: 95 — ABNORMAL HIGH

## 2011-03-28 LAB — CORTISOL: Cortisol, Plasma: 31.2

## 2011-03-28 LAB — URINALYSIS, ROUTINE W REFLEX MICROSCOPIC
Bilirubin Urine: NEGATIVE
Glucose, UA: NEGATIVE
Ketones, ur: NEGATIVE
pH: 6.5

## 2011-03-28 LAB — POCT I-STAT 3, ART BLOOD GAS (G3+)
TCO2: 25
pCO2 arterial: 39.1
pH, Arterial: 7.39

## 2011-03-28 LAB — URINE MICROSCOPIC-ADD ON

## 2011-03-28 LAB — MAGNESIUM: Magnesium: 1.9

## 2011-03-28 LAB — CULTURE, BLOOD (ROUTINE X 2)

## 2011-03-28 LAB — POCT I-STAT CREATININE: Creatinine, Ser: 1.6 — ABNORMAL HIGH

## 2011-03-28 LAB — URINE CULTURE: Colony Count: 4000

## 2011-03-28 LAB — AMYLASE: Amylase: 115

## 2011-03-28 LAB — LIPASE, BLOOD: Lipase: 76 — ABNORMAL HIGH

## 2011-03-28 LAB — APTT: aPTT: 39 — ABNORMAL HIGH

## 2011-04-11 LAB — DIFFERENTIAL
Basophils Absolute: 0
Basophils Relative: 0
Eosinophils Absolute: 0 — ABNORMAL LOW
Neutro Abs: 3.5
Neutrophils Relative %: 81 — ABNORMAL HIGH

## 2011-04-11 LAB — COMPREHENSIVE METABOLIC PANEL
Alkaline Phosphatase: 76
BUN: 23
CO2: 26
GFR calc non Af Amer: 39 — ABNORMAL LOW
Glucose, Bld: 117 — ABNORMAL HIGH
Potassium: 4.7
Total Bilirubin: 1.1
Total Protein: 6.5

## 2011-04-11 LAB — CARDIAC PANEL(CRET KIN+CKTOT+MB+TROPI)
CK, MB: 2.8
CK, MB: 3.6
Relative Index: 1.7
Relative Index: 1.9
Total CK: 186

## 2011-04-11 LAB — CULTURE, BLOOD (ROUTINE X 2)
Culture: NO GROWTH
Culture: NO GROWTH

## 2011-04-11 LAB — BASIC METABOLIC PANEL
BUN: 21
CO2: 27
Calcium: 10.8 — ABNORMAL HIGH
GFR calc non Af Amer: 42 — ABNORMAL LOW
Glucose, Bld: 86

## 2011-04-11 LAB — URINALYSIS, ROUTINE W REFLEX MICROSCOPIC
Bilirubin Urine: NEGATIVE
Glucose, UA: NEGATIVE
Ketones, ur: NEGATIVE
pH: 5.5

## 2011-04-11 LAB — POCT CARDIAC MARKERS
CKMB, poc: 1.5
Myoglobin, poc: 65
Operator id: 295021

## 2011-04-11 LAB — LIPID PANEL
LDL Cholesterol: 111 — ABNORMAL HIGH
Triglycerides: 76

## 2011-04-11 LAB — PHOSPHORUS: Phosphorus: 2.1 — ABNORMAL LOW

## 2011-04-11 LAB — CBC
HCT: 45.9
Hemoglobin: 14.5
RBC: 5.67
RDW: 16.7 — ABNORMAL HIGH

## 2011-04-11 LAB — TROPONIN I: Troponin I: 0.07 — ABNORMAL HIGH

## 2011-04-11 LAB — MAGNESIUM: Magnesium: 1.8

## 2011-07-14 ENCOUNTER — Emergency Department (HOSPITAL_COMMUNITY)
Admission: EM | Admit: 2011-07-14 | Discharge: 2011-07-14 | Disposition: A | Discharge status: Other Acute Inpatient Hospital | Payer: PRIVATE HEALTH INSURANCE | Attending: Emergency Medicine | Admitting: Emergency Medicine

## 2011-07-14 ENCOUNTER — Encounter (HOSPITAL_COMMUNITY): Payer: Self-pay

## 2011-07-14 ENCOUNTER — Emergency Department (HOSPITAL_COMMUNITY): Payer: PRIVATE HEALTH INSURANCE

## 2011-07-14 DIAGNOSIS — I120 Hypertensive chronic kidney disease with stage 5 chronic kidney disease or end stage renal disease: Secondary | ICD-10-CM | POA: Insufficient documentation

## 2011-07-14 DIAGNOSIS — Z940 Kidney transplant status: Secondary | ICD-10-CM | POA: Insufficient documentation

## 2011-07-14 DIAGNOSIS — M25539 Pain in unspecified wrist: Secondary | ICD-10-CM | POA: Insufficient documentation

## 2011-07-14 DIAGNOSIS — N186 End stage renal disease: Secondary | ICD-10-CM | POA: Insufficient documentation

## 2011-07-14 DIAGNOSIS — Z794 Long term (current) use of insulin: Secondary | ICD-10-CM | POA: Insufficient documentation

## 2011-07-14 DIAGNOSIS — E119 Type 2 diabetes mellitus without complications: Secondary | ICD-10-CM | POA: Insufficient documentation

## 2011-07-14 DIAGNOSIS — Z79899 Other long term (current) drug therapy: Secondary | ICD-10-CM | POA: Insufficient documentation

## 2011-07-14 DIAGNOSIS — M25559 Pain in unspecified hip: Secondary | ICD-10-CM

## 2011-07-14 DIAGNOSIS — Z992 Dependence on renal dialysis: Secondary | ICD-10-CM | POA: Insufficient documentation

## 2011-07-14 DIAGNOSIS — Z86718 Personal history of other venous thrombosis and embolism: Secondary | ICD-10-CM | POA: Insufficient documentation

## 2011-07-14 DIAGNOSIS — IMO0002 Reserved for concepts with insufficient information to code with codable children: Secondary | ICD-10-CM | POA: Insufficient documentation

## 2011-07-14 DIAGNOSIS — G8190 Hemiplegia, unspecified affecting unspecified side: Secondary | ICD-10-CM | POA: Insufficient documentation

## 2011-07-14 DIAGNOSIS — Z7982 Long term (current) use of aspirin: Secondary | ICD-10-CM | POA: Insufficient documentation

## 2011-07-14 DIAGNOSIS — E7800 Pure hypercholesterolemia, unspecified: Secondary | ICD-10-CM | POA: Insufficient documentation

## 2011-07-14 DIAGNOSIS — E213 Hyperparathyroidism, unspecified: Secondary | ICD-10-CM | POA: Insufficient documentation

## 2011-07-14 DIAGNOSIS — Z931 Gastrostomy status: Secondary | ICD-10-CM | POA: Insufficient documentation

## 2011-07-14 MED ORDER — KETOROLAC TROMETHAMINE 60 MG/2ML IM SOLN
60.0000 mg | Freq: Once | INTRAMUSCULAR | Status: AC
  Administered 2011-07-14: 60 mg via INTRAMUSCULAR
  Filled 2011-07-14: qty 2

## 2011-07-14 NOTE — ED Provider Notes (Signed)
History     CSN: [id number]  Arrival date & time 07/14/11  4098   First MD Initiated Contact with Patient 07/14/11 0451      Chief Complaint  Patient presents with  . Fall    (Consider location/radiation/quality/duration/timing/severity/associated sxs/prior treatment) HPI Comments: 56 year old male with a history of renal failure on dialysis, hypertension, intracranial bleeding. Currently in a nursing facility and per nursing home report he was found on the ground without complaint. He did occasionally point to his right groin pointing to it and acting as though it was causing pain. He is nonverbal but is able to follow commands.  Patient is a 56 y.o. male presenting with fall. The history is provided by the nursing home and the EMS personnel. The history is limited by a language barrier (Patient is nonverbal).  Fall    Past Medical History  Diagnosis Date  . Hypertension   . Dyslipidemia   . Carpal tunnel syndrome   . A-V fistula   . Anemia   . Hemorrhoids   . Hyperparathyroidism   . Intracranial hemorrhage   . Hypotension   . Hyperkalemia   . Kidney disease   . Sepsis   . GI bleeding   . DVT (deep venous thrombosis)   . PVD (peripheral vascular disease)   . DM (diabetes mellitus)   . Cellulitis   . IV infiltration     Right upper extremity  . PPD positive   . 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 Hypokinesis     EF of 60% with mild anterior hypokinesis, minimal coronary artery disease on catheterization in December of 2007    Past Surgical History  Procedure Date  . Kidney transplant     History of renal transplant maintained on chronic immunosuppressive therapy  . Av fistula placement     Right arm  . Peripherally inserted central catheter insertion     line placement  . Amputation   . Gastrostomy   . Ivc filter     bilateral 5th toe amputation  . Brain biopsy   . #6 shiley cuff     Less trache     Family History  Problem Relation Age of Onset  . Heart disease Mother     History  Substance Use Topics  . Smoking status: Former Smoker    Quit date: 01/06/2001  . Smokeless tobacco: Not on file  . Alcohol Use: No      Review of Systems  Unable to perform ROS: Other    Allergies  Amoxicillin; Ampicillin; Morphine and related; and Penicillins  Home Medications   Current Outpatient Rx  Name Route Sig Dispense Refill  . ALLOPURINOL 100 MG PO TABS Oral Take 100 mg by mouth daily.      Marland Kitchen AMLODIPINE BESYLATE 10 MG PO TABS Oral Take 10 mg by mouth daily.      . ASPIRIN 81 MG PO TABS Oral Take 81 mg by mouth daily.      . ATORVASTATIN CALCIUM 10 MG PO TABS Oral Take 10 mg by mouth daily.    Marland Kitchen CITALOPRAM HYDROBROMIDE 10 MG PO TABS Oral Take 10 mg by mouth daily.    Marland Kitchen DOCUSATE SODIUM 50 MG/5ML PO LIQD Oral Take 50 mg by mouth at bedtime.    Marland Kitchen HYDROCODONE-ACETAMINOPHEN 5-500 MG PO TABS Oral Take 1 tablet by mouth every 6 (six) hours as needed.      . INSULIN ASPART 100  UNIT/ML [place] SOLN Subcutaneous Inject 7 Units into the skin 3 (three) times daily before meals.    . INSULIN GLARGINE 100 UNIT/ML [place] SOLN Subcutaneous Inject 22 Units into the skin at bedtime.      Marland Kitchen LEVETIRACETAM 500 MG PO TABS Oral Take 500 mg by mouth every 12 (twelve) hours.      Marland Kitchen LORAZEPAM 0.5 MG PO TABS Oral Take 0.5 mg by mouth every 8 (eight) hours as needed. Anxiety    . OMEPRAZOLE 20 MG PO CPDR Oral Take 20 mg by mouth daily.      Marland Kitchen POLYETHYLENE GLYCOL 3350 PO PACK Oral Take 17 g by mouth as directed.      Marland Kitchen PREDNISONE 5 MG PO TABS Oral Take 5 mg by mouth daily.     Marland Kitchen TACROLIMUS 0.5 MG PO CAPS Oral Take 0.5 mg by mouth 2 (two) times daily.    Marland Kitchen TACROLIMUS 1 MG PO CAPS Oral Take 3 mg by mouth 2 (two) times daily.     Marland Kitchen VITAMIN D (ERGOCALCIFEROL) 50000 UNITS PO CAPS Oral Take 50,000 Units by mouth every 30 (thirty) days.      BP 137/88  Pulse 79  Temp(Src) 98.8 F (37.1 C) (Oral)  Resp 18  SpO2  99%  Physical Exam  Nursing note and vitals reviewed. Constitutional: He appears well-developed and well-nourished. No distress.  HENT:  Head: Normocephalic and atraumatic.  Mouth/Throat: Oropharynx is clear and moist. No oropharyngeal exudate.       No bruising or hematomas or lacerations or abrasions to the scalp or face  Eyes: Conjunctivae and EOM are normal. Pupils are equal, round, and reactive to light. Right eye exhibits no discharge. Left eye exhibits no discharge. No scleral icterus.  Neck: Normal range of motion. Neck supple. No JVD present. No thyromegaly present.  Cardiovascular: Normal rate, regular rhythm, normal heart sounds and intact distal pulses.  Exam reveals no gallop and no friction rub.   No murmur heard. Pulmonary/Chest: Effort normal and breath sounds normal. No respiratory distress. He has no wheezes. He has no rales. He exhibits no tenderness ( No tenderness over the chest wall bilaterally).  Abdominal: Soft. Bowel sounds are normal. He exhibits no distension and no mass. There is no tenderness.  Musculoskeletal: Normal range of motion. He exhibits tenderness ( Mild tenderness to the right lower extremity when moved in a flexed position. No deformities noted, no shortening or rotation of the leg.). He exhibits no edema.       Right wrist with swelling and stiffness, this is the side that he had some paralysis on. No left-sided injury or abnormality  Lymphadenopathy:    He has no cervical adenopathy.  Neurological: He is alert. Coordination normal.  Skin: Skin is warm and dry. No rash noted. No erythema.  Psychiatric: He has a normal mood and affect. His behavior is normal.    ED Course  Procedures (including critical care time)  Labs Reviewed - No data to display Dg Elbow Complete Right  07/14/2011  *RADIOLOGY REPORT*  Clinical Data: Fall.  Right elbow pain.  RIGHT ELBOW - COMPLETE 3+ VIEW  Comparison: None.  Findings: Vascular clips are present and a cubital  fossa, presumably for dialysis access.  There is no effusion.  Anatomic alignment of the elbow.  Radial head appears intact.  No displaced fracture.  Dermal calcifications and small vessel atherosclerosis. 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  IMPRESSION: No acute osseous abnormality.  Original Report Authenticated By: Andreas Newport, M.D.   Dg Wrist Complete Right  07/14/2011  *RADIOLOGY REPORT*  Clinical Data: Fall.  Wrist pain.  Wrist trauma.  RIGHT WRIST - COMPLETE 3+ VIEW  Comparison: None.  Findings: STT and basal joint of the thumb osteoarthritis. Diabetic type small vessel atherosclerosis.  Soft tissue swelling is present over the dorsum of the wrist.  Carpal spacing and alignment appears normal.  On the frontal view, there is a linear density overlying the ulnar aspect of the distal radius.  This appears to represent atherosclerotic calcification associated with small vessel disease. Old healed fifth metacarpal boxers fracture.  IMPRESSION: No displaced fracture of the wrist.  Small vessel atherosclerosis and dorsal wrist soft tissue swelling.  Original Report Authenticated By: Andreas Newport, M.D.   Dg Hip Complete Right  07/14/2011  *RADIOLOGY REPORT*  Clinical Data: Fall.  Right hip pain.  RIGHT HIP - COMPLETE 2+ VIEW  Comparison: None.  Findings: Calcified injection granulomas are present over both gluteal regions.  No displaced pelvic ring or hip fracture is identified.  The hip joint spaces appear symmetric.  Severe atherosclerosis.  Prominent skin fold crosses the right femoral neck.  Mild right hip osteoarthritis.  IMPRESSION: No acute osseous abnormality.  Mild right hip osteoarthritis.  Original Report Authenticated By: Andreas Newport, M.D.     1. Hip pain       MDM  No obvious external signs of trauma. Will image the right hip and right wrist. Pain medication given.  Imaging negative, home to nh        Vida Roller,  MD 07/14/11 0630

## 2011-07-14 NOTE — ED Notes (Signed)
QVZ:[id number]<VF> Expected date:07/14/11<BR> Expected time: 3:21 AM<BR> Means of arrival:Ambulance<BR> Comments:<BR> Fall, no complaints

## 2011-07-14 NOTE — ED Notes (Signed)
Per ems patient was found on the floor, pt has no complaints however the nursing home staff stated that he needed to be evaluated for rt leg pain but pt is paralyzed on the rt side. Pt is nonverbal but understands and will point to objects. Pt points to his groin area and the staff states that he has shingles on his groin, but theres no documentation

## 2011-07-14 NOTE — ED Notes (Signed)
Called [facility] to pick up patient and notified facility

## 2013-05-29 ENCOUNTER — Encounter: Payer: Self-pay | Admitting: Cardiovascular Disease

## 2013-05-29 ENCOUNTER — Encounter: Payer: Self-pay | Admitting: *Deleted

## 2013-05-29 ENCOUNTER — Ambulatory Visit (INDEPENDENT_AMBULATORY_CARE_PROVIDER_SITE_OTHER): Payer: PRIVATE HEALTH INSURANCE | Admitting: Cardiovascular Disease

## 2013-05-29 VITALS — BP 130/70 | HR 67

## 2013-05-29 DIAGNOSIS — I10 Essential (primary) hypertension: Secondary | ICD-10-CM

## 2013-05-29 DIAGNOSIS — R0609 Other forms of dyspnea: Secondary | ICD-10-CM

## 2013-05-29 DIAGNOSIS — E119 Type 2 diabetes mellitus without complications: Secondary | ICD-10-CM

## 2013-05-29 DIAGNOSIS — Z9889 Other specified postprocedural states: Secondary | ICD-10-CM

## 2013-05-29 DIAGNOSIS — I43 Cardiomyopathy in diseases classified elsewhere: Secondary | ICD-10-CM

## 2013-05-29 DIAGNOSIS — R0600 Dyspnea, unspecified: Secondary | ICD-10-CM

## 2013-05-29 NOTE — Assessment & Plan Note (Signed)
Discussed low carb diet.  Target hemoglobin A1c is 6.5 or less.  Continue current medications.  

## 2013-05-29 NOTE — Assessment & Plan Note (Signed)
Doubt dyspnea related to heart. ? Aspiration issues.  He is not a candidate for aggressive cardiac w/u given comorbidities and functional status F/U echo and adjust meds ith EF low

## 2013-05-29 NOTE — Assessment & Plan Note (Signed)
Well controlled.  Continue current medications and low sodium Dash type diet.    

## 2013-05-29 NOTE — Progress Notes (Signed)
Patient ID: Justin [name], male   DOB: 1956-04-17, 56 y.o.   MRN: [id number]   57 yo previously seen by Dr Elease Hashimoto and Samule Ohm  Normal cath in 2008  FINDINGS:  1. LV: 171/15/22. EF 60% with mild anterior hypokinesis.  2. No aortic stenosis or mitral regurgitation.  3. Left main: Angiographically normal.  4. LAD: Large vessel giving rise to two moderate sized diagonals. It  is angiographically normal.  5. Circumflex: Large, dominant vessel. It gives rise to three obtuse  marginals and a posterior descending artery. The AV groove  circumflex has a 20% stenosis after the origin of the first  marginal.  6. RCA: Small, nondominant vessel. It is angiographically normal.  History of CNS hemorhage and advanced dementia with ETOH/Drug abuse  Bleed occurred during biopsy for B cell lymphoma  Prevous history of DCM but EF 50-55% in 2008 Apparently having more dyspnea. FT is out and eating soft solids Not clear if he passed his swallowing test.  History from daughter.  He only mumbles I'm allright over and over again Has had maceration of scrotum and has foley in last 2 weeks  No chest pain. Non ambulatory.     ROS: Denies fever, malais, weight loss, blurry vision, decreased visual acuity, cough, sputum, SOB, hemoptysis, pleuritic pain, palpitaitons, heartburn, abdominal pain, melena, lower extremity edema, claudication, or rash.  All other systems reviewed and negative   General: Affect appropriate Chronically ill black male in wheel chair HEENT: normal Neck supple with no adenopathy JVP normal no bruits no thyromegaly Lungs clear with no wheezing and good diaphragmatic motion Heart:  S1/S2 no murmur,rub, gallop or click PMI normal Abdomen: benighn, BS positve, no tenderness, no AAA previous PEG no bruit.  No HSM or HJR Distal pulses intact with no bruits No edema Cannot communicate and speak coherantly Foley  Skin warm and dry No muscular weakness  Medications Current Outpatient  Prescriptions  Medication Sig Dispense Refill  . allopurinol (ZYLOPRIM) 100 MG tablet Take 100 mg by mouth daily.        Marland Kitchen amLODipine (NORVASC) 10 MG tablet Take 10 mg by mouth daily.        Marland Kitchen aspirin 81 MG tablet Take 81 mg by mouth daily.        Marland Kitchen atorvastatin (LIPITOR) 10 MG tablet Take 10 mg by mouth daily.      . citalopram (CELEXA) 10 MG tablet Take 10 mg by mouth daily.      Marland Kitchen docusate (COLACE) 50 MG/5ML liquid Take 50 mg by mouth at bedtime.      Marland Kitchen HYDROcodone-acetaminophen (VICODIN) 5-500 MG per tablet Take 1 tablet by mouth every 6 (six) hours as needed.        . insulin aspart (NOVOLOG) 100 UNIT/ML injection Inject 7 Units into the skin 3 (three) times daily before meals.      . insulin glargine (LANTUS) 100 UNIT/ML injection Inject 22 Units into the skin at bedtime.        . levETIRAcetam (KEPPRA) 500 MG tablet Take 500 mg by mouth every 12 (twelve) hours.        Marland Kitchen LORazepam (ATIVAN) 0.5 MG tablet Take 0.5 mg by mouth every 8 (eight) hours as needed. Anxiety      . omeprazole (PRILOSEC) 20 MG capsule Take 20 mg by mouth daily.        . polyethylene glycol (MIRALAX / GLYCOLAX) packet Take 17 g by mouth as directed.        Marland Kitchen  predniSONE (DELTASONE) 5 MG tablet Take 5 mg by mouth daily.       . tacrolimus (PROGRAF) 0.5 MG capsule Take 0.5 mg by mouth 2 (two) times daily.      . tacrolimus (PROGRAF) 1 MG capsule Take 3 mg by mouth 2 (two) times daily.       . Vitamin D, Ergocalciferol, (DRISDOL) 50000 UNITS CAPS Take 50,000 Units by mouth every 30 (thirty) days.       No current facility-administered medications for this visit.    Allergies Amoxicillin; Ampicillin; Morphine and related; and Penicillins  Family History: Family History  Problem Relation Age of Onset  . Heart disease Mother     Social History: History   Social History  . Marital Status: Divorced    Spouse Name: N/A    Number of Children: 2  . Years of Education: N/A   Occupational History  . Disabled     Social History Main Topics  . Smoking status: Former Smoker    Quit date: 01/06/2001  . Smokeless tobacco: Not on file  . Alcohol Use: No  . Drug Use: Yes     Comment: Has had a previous history of Cocaine Abuse  . Sexual Activity: Not on file   Other Topics Concern  . Not on file   Social History Narrative   Presently living at the nursing home.   He is a full code.    Electrocardiogram:  SR rate 67 LVH with strain no change from 2008  Assessment and Plan

## 2013-05-29 NOTE — Assessment & Plan Note (Signed)
Placed to facilitate skin care.  Plan per facility UA if in for more than 4 weeks

## 2013-05-29 NOTE — Patient Instructions (Signed)
The current medical regimen is effective;  continue present plan and medications.  Your physician has requested that you have an echocardiogram. Echocardiography is a painless test that uses sound waves to create images of your heart. It provides your doctor with information about the size and shape of your heart and how well your heart's chambers and valves are working. This procedure takes approximately one hour. There are no restrictions for this procedure.  Follow up as needed. 

## 2013-06-07 ENCOUNTER — Ambulatory Visit (HOSPITAL_COMMUNITY)
Admission: RE | Admit: 2013-06-07 | Discharge: 2013-06-07 | Disposition: A | Discharge status: Home / Self Care | Payer: PRIVATE HEALTH INSURANCE | Source: Ambulatory Visit | Attending: Cardiovascular Disease | Admitting: Cardiovascular Disease

## 2013-06-07 DIAGNOSIS — I43 Cardiomyopathy in diseases classified elsewhere: Secondary | ICD-10-CM

## 2013-06-07 DIAGNOSIS — I519 Heart disease, unspecified: Secondary | ICD-10-CM

## 2013-06-07 DIAGNOSIS — R0989 Other specified symptoms and signs involving the circulatory and respiratory systems: Secondary | ICD-10-CM | POA: Insufficient documentation

## 2013-06-07 DIAGNOSIS — R0600 Dyspnea, unspecified: Secondary | ICD-10-CM

## 2013-06-07 DIAGNOSIS — R0609 Other forms of dyspnea: Secondary | ICD-10-CM | POA: Insufficient documentation

## 2013-06-07 NOTE — Progress Notes (Signed)
  Echocardiogram 2D Echocardiogram has been performed.  Georgian Co 06/07/2013, 12:58 PM

## 2013-06-14 ENCOUNTER — Emergency Department (HOSPITAL_COMMUNITY)
Admission: EM | Admit: 2013-06-14 | Discharge: 2013-06-15 | Disposition: A | Discharge status: Other Acute Inpatient Hospital | Payer: PRIVATE HEALTH INSURANCE | Attending: Emergency Medicine | Admitting: Emergency Medicine

## 2013-06-14 ENCOUNTER — Encounter (HOSPITAL_COMMUNITY): Payer: Self-pay | Admitting: Emergency Medicine

## 2013-06-14 DIAGNOSIS — Z862 Personal history of diseases of the blood and blood-forming organs and certain disorders involving the immune mechanism: Secondary | ICD-10-CM | POA: Diagnosis not present

## 2013-06-14 DIAGNOSIS — Z79899 Other long term (current) drug therapy: Secondary | ICD-10-CM | POA: Insufficient documentation

## 2013-06-14 DIAGNOSIS — F0390 Unspecified dementia without behavioral disturbance: Secondary | ICD-10-CM | POA: Diagnosis not present

## 2013-06-14 DIAGNOSIS — E785 Hyperlipidemia, unspecified: Secondary | ICD-10-CM | POA: Insufficient documentation

## 2013-06-14 DIAGNOSIS — R4182 Altered mental status, unspecified: Secondary | ICD-10-CM | POA: Diagnosis present

## 2013-06-14 DIAGNOSIS — Z794 Long term (current) use of insulin: Secondary | ICD-10-CM | POA: Insufficient documentation

## 2013-06-14 DIAGNOSIS — Z7982 Long term (current) use of aspirin: Secondary | ICD-10-CM | POA: Insufficient documentation

## 2013-06-14 DIAGNOSIS — I120 Hypertensive chronic kidney disease with stage 5 chronic kidney disease or end stage renal disease: Secondary | ICD-10-CM | POA: Insufficient documentation

## 2013-06-14 DIAGNOSIS — Z872 Personal history of diseases of the skin and subcutaneous tissue: Secondary | ICD-10-CM | POA: Insufficient documentation

## 2013-06-14 DIAGNOSIS — Z940 Kidney transplant status: Secondary | ICD-10-CM | POA: Diagnosis not present

## 2013-06-14 DIAGNOSIS — Z8673 Personal history of transient ischemic attack (TIA), and cerebral infarction without residual deficits: Secondary | ICD-10-CM | POA: Diagnosis not present

## 2013-06-14 DIAGNOSIS — Z86718 Personal history of other venous thrombosis and embolism: Secondary | ICD-10-CM | POA: Diagnosis not present

## 2013-06-14 DIAGNOSIS — Z880 Allergy status to penicillin: Secondary | ICD-10-CM | POA: Diagnosis not present

## 2013-06-14 DIAGNOSIS — Z8619 Personal history of other infectious and parasitic diseases: Secondary | ICD-10-CM | POA: Diagnosis not present

## 2013-06-14 DIAGNOSIS — Z91040 Latex allergy status: Secondary | ICD-10-CM | POA: Insufficient documentation

## 2013-06-14 DIAGNOSIS — Z8719 Personal history of other diseases of the digestive system: Secondary | ICD-10-CM | POA: Insufficient documentation

## 2013-06-14 DIAGNOSIS — E119 Type 2 diabetes mellitus without complications: Secondary | ICD-10-CM | POA: Diagnosis not present

## 2013-06-14 DIAGNOSIS — Z87891 Personal history of nicotine dependence: Secondary | ICD-10-CM | POA: Diagnosis not present

## 2013-06-14 DIAGNOSIS — N186 End stage renal disease: Secondary | ICD-10-CM | POA: Insufficient documentation

## 2013-06-14 DIAGNOSIS — IMO0002 Reserved for concepts with insufficient information to code with codable children: Secondary | ICD-10-CM | POA: Insufficient documentation

## 2013-06-14 DIAGNOSIS — Z992 Dependence on renal dialysis: Secondary | ICD-10-CM | POA: Diagnosis not present

## 2013-06-14 NOTE — ED Provider Notes (Signed)
CSN: [id number]     Arrival date & time 06/14/13  2244 History   First MD Initiated Contact with Patient 06/14/13 2321     Chief Complaint  Patient presents with  . Altered Mental Status   (Consider location/radiation/quality/duration/timing/severity/associated sxs/prior Treatment) HPI Comments: 57 yo AA male resident of SNF presents via EMS with cc of being "lethargic and unable to arouse".  Pt has MMP to include h/o stroke, anemia, HTN, CKD, DM, ICH, GIB, DVT, PVD, hyperparathyroidism, AV fistula.    PCP: Claude Manges: April Craney [phone]  [phone])  Patient is a 57 y.o. male presenting with altered mental status. The history is provided by the patient and the EMS personnel. The history is limited by the condition of the patient.  Altered Mental Status Presenting symptoms: lethargy   Presenting symptoms: no behavior changes, no combativeness, no confusion, no disorientation, no memory loss, no partial responsiveness and no unresponsiveness   Severity:  Mild Most recent episode:  Today Episode history:  Single Duration:  1 day Timing:  Constant Chronicity:  Chronic Context: dementia and nursing home resident   Context: not drug use, not head injury, not homeless, not a recent change in medication, not a recent illness and not a recent infection   Associated symptoms comment:  Unable to obtain history b/c pt is nonverbal.    Old Records:  57 yo previously seen by Dr Elease Hashimoto and Samule Ohm Normal cath in 2008  FINDINGS:  1. LV: 171/15/22. EF 60% with mild anterior hypokinesis.  2. No aortic stenosis or mitral regurgitation.  3. Left main: Angiographically normal.  4. LAD: Large vessel giving rise to two moderate sized diagonals. It  is angiographically normal.  5. Circumflex: Large, dominant vessel. It gives rise to three obtuse  marginals and a posterior descending artery. The AV groove  circumflex has a 20% stenosis after the origin of the first  marginal.  6. RCA:  Small, nondominant vessel. It is angiographically normal.  Echo: Study Conclusions  - Left ventricle: Left ventricle and endocardoim is poorly visualized. Estimated LV EF is 35-40%. Regional wall motion abnormalities cannot be excluded. The cavity size was normal. Wall thickness was normal. Systolic function was moderately reduced. The estimated ejection fraction was in the range of 35% to 40%. Regional wall motion abnormalities cannot be excluded. Doppler parameters are consistent with abnormal left ventricular relaxation (grade 1 diastolic dysfunction). - Aortic valve: A bicuspid morphology cannot be excluded; mildly thickened leaflets. - Right ventricle: The cavity size was mildly dilated. Systolic function was mildly to moderately reduced. - Atrial septum: No defect or patent foramen ovale was identified. Transthoracic echocardiography. M-mode, complete 2D, spectral Doppler, and color Doppler. Patient status: Outpatient. Location: Echo laboratory.  DATE OF ADMISSION: 08/21/2007  DATE OF DISCHARGE:  DISCHARGE SUMMARY  DISCHARGE DIAGNOSES:  1. Sepsis secondary to PICC line (Pseudomonas putida infection).  2. End-stage renal disease status post cadaveric renal transplant in  10/2006 complicated by rejection in July 2008.  3. 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  4. Status post brain biopsy for central nervous system lymphoma  complicated with intracranial hemorrhage December 2008.  5. Hypertension.  6. Diabetes mellitus type 2.  7. Tertiary hyperparathyroidism (on IV Pamidronate at time of  admission. He had been hypercalcemic and Sensipar was unable to be  given via PEG tube (according to communication with [facility]  transplant team)  8. Congestive heart failure with ejection fraction of 50% per 2-D echo  on 08/28/2007.  9. Coronary artery disease.  10.Fifth toe amputation bilaterally.  11.History of positive PPD treated with INH.   12.Keppra for prophylaxis of seizures (? seizure disord    Past Medical History  Diagnosis Date  . Hypertension   . Dyslipidemia   . Carpal tunnel syndrome   . A-V fistula   . Anemia   . Hemorrhoids   . Hyperparathyroidism   . Intracranial hemorrhage   . Hypotension   . Hyperkalemia   . Kidney disease   . Sepsis(995.91)   . GI bleeding   . DVT (deep venous thrombosis)   . PVD (peripheral vascular disease)   . DM (diabetes mellitus)   . Cellulitis   . IV infiltration     Right upper extremity  . PPD positive   . 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 Hypokinesis     EF of 60% with mild anterior hypokinesis, minimal coronary artery disease on catheterization in December of 2007   Past Surgical History  Procedure Laterality Date  . Kidney transplant      History of renal transplant maintained on chronic immunosuppressive therapy  . Av fistula placement      Right arm  . Peripherally inserted central catheter insertion      line placement  . Amputation    . Gastrostomy    . 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 Brain biopsy    . #6 shiley cuff      Less trache   Family History  Problem Relation Age of Onset  . Heart disease Mother    History  Substance Use Topics  . Smoking status: Former Smoker    Quit date: 01/06/2001  . Smokeless tobacco: Not on file  . Alcohol Use: No    Review of Systems  Unable to perform ROS: Patient nonverbal  Psychiatric/Behavioral: Negative for memory loss and confusion.    Allergies  Amoxicillin; Ampicillin; Latex; Morphine and related; Penicillins; and Tape  Home Medications   Current Outpatient Rx  Name  Route  Sig  Dispense  Refill  . albuterol (PROVENTIL) (2.5 MG/3ML) 0.083% nebulizer solution               . allopurinol (ZYLOPRIM) 100 MG tablet   Oral   Take 100 mg by mouth daily.           Marland Kitchen amLODipine (NORVASC) 10 MG tablet   Oral   Take 10 mg by  mouth daily.           Marland Kitchen aspirin 81 MG tablet   Oral   Take 81 mg by mouth daily.           Marland Kitchen atorvastatin (LIPITOR) 10 MG tablet   Oral   Take 10 mg by mouth daily.         . citalopram (CELEXA) 10 MG tablet   Oral   Take 20 mg by mouth daily.          Marland Kitchen gabapentin (NEURONTIN) 300 MG capsule      300 mg 2 (two) times daily.          . hydrALAZINE (APRESOLINE) 25 MG tablet   Oral   Take 25 mg by mouth 2 (two) times daily.         Marland Kitchen HYDROcodone-acetaminophen (VICODIN) 5-500 MG per tablet   Oral   Take 1 tablet by mouth every 6 (six) hours as needed.           Marland Kitchen  insulin aspart (NOVOLOG) 100 UNIT/ML injection   Subcutaneous   Inject 9 Units into the skin 3 (three) times daily before meals.          . insulin glargine (LANTUS) 100 UNIT/ML injection   Subcutaneous   Inject 31 Units into the skin at bedtime.          . isosorbide dinitrate (ISORDIL) 10 MG tablet   Oral   Take 10 mg by mouth 2 (two) times daily.         Marland Kitchen levETIRAcetam (KEPPRA) 500 MG tablet   Oral   Take 500 mg by mouth every 12 (twelve) hours.           Marland Kitchen LORazepam (ATIVAN) 0.5 MG tablet   Oral   Take 0.5 mg by mouth every 8 (eight) hours as needed. Anxiety         . omeprazole (PRILOSEC) 20 MG capsule   Oral   Take 20 mg by mouth daily.           Marland Kitchen oxyCODONE-acetaminophen (PERCOCET/ROXICET) 5-325 MG per tablet   Oral   Take 1 tablet by mouth 2 (two) times daily as needed for moderate pain.          . polyethylene glycol (MIRALAX / GLYCOLAX) packet   Oral   Take 17 g by mouth as directed.           . Potassium & Sodium Phosphates (PHOS-NAK PO)   Oral   Take 1 Package by mouth 3 (three) times daily.         . predniSONE (DELTASONE) 5 MG tablet   Oral   Take 5 mg by mouth daily.          . tacrolimus (PROGRAF) 0.5 MG capsule   Oral   Take 0.5 mg by mouth 2 (two) times daily.         . tacrolimus (PROGRAF) 1 MG capsule   Oral   Take 3 mg by mouth 2  (two) times daily.         Marland Kitchen tiZANidine (ZANAFLEX) 2 MG tablet   Oral   Take 2 mg by mouth 2 (two) times daily.          . Vitamin D, Ergocalciferol, (DRISDOL) 50000 UNITS CAPS   Oral   Take 50,000 Units by mouth every 30 (thirty) days.          BP 133/105  Pulse 64  Temp(Src) 97.9 F (36.6 C) (Oral)  Resp 12  SpO2 93% Physical Exam  Nursing note and vitals reviewed. Constitutional: He appears well-nourished. No distress.  HENT:  Head: Normocephalic and atraumatic.  Eyes: Conjunctivae are normal. Pupils are equal, round, and reactive to light.  Neck: Normal range of motion. Neck supple.  Cardiovascular: Normal rate and regular rhythm.   Pulmonary/Chest: Effort normal and breath sounds normal. He has no wheezes. He has no rales.  Abdominal: Soft. Bowel sounds are normal. There is no tenderness. There is no rebound and no guarding.  Musculoskeletal:  Unable to move RUE, RLE, scabbing and skin changes to bilat LE  Neurological: He is alert. He exhibits abnormal muscle tone. GCS eye subscore is 4. GCS verbal subscore is 2.  Pt is nonverbal.  He does follow commands - follows light with eyes.    Skin: He is not diaphoretic.    ED Course  Procedures (including critical care time) Labs Review Labs Reviewed  COMPREHENSIVE METABOLIC PANEL - Abnormal; Notable for the following:  Glucose, Bld 160 (*)    BUN 24 (*)    Creatinine, Ser 1.45 (*)    Albumin 3.1 (*)    GFR calc non Af Amer 51 (*)    GFR calc Af Amer 59 (*)    All other components within normal limits  CBC WITH DIFFERENTIAL  TROPONIN I  URINALYSIS, ROUTINE W REFLEX MICROSCOPIC   Imaging Review Dg Chest 2 View  06/15/2013   CLINICAL DATA:  Altered mental status.  EXAM: CHEST  2 VIEW  COMPARISON:  Chest radiograph performed 04/14/2010  FINDINGS: The lungs are hypoexpanded. Mild left basilar opacity likely reflects atelectasis, similar in appearance to the prior study. There is no evidence of effusion or  pneumothorax.  The heart is borderline normal in size; the mediastinal contour is within normal limits. No acute osseous abnormalities are seen.  IMPRESSION: Lungs hypoexpanded; mild left basilar opacity likely reflects atelectasis.   Electronically Signed   By: Roanna Raider M.D.   On: 06/15/2013 03:09   Ct Head Wo Contrast  06/15/2013   CLINICAL DATA:  Altered mental status. Skilled nursing home resident.  EXAM: CT HEAD WITHOUT CONTRAST  TECHNIQUE: Contiguous axial images were obtained from the base of the skull through the vertex without contrast.  COMPARISON:  CT head 06/14/2007.  FINDINGS: Extensive volume loss in the left basal ganglia, thalamus, and deep white matter of the left hemisphere from prior insult, likely infarct with hemorrhage. Ex vacuo ventricular enlargement on the left. Moderately significant cortical atrophy and deep white matter hypoattenuation affecting much of the frontal, temporal, and anterior parietal regions on the left. Dystrophic calcification along the ventricular margin on the left. Generalized atrophy premature for age. Vascular calcification. Calvarium intact except for left frontal burr hole. No acute sinus disease. Right mastoid fluid, likely effusion.  IMPRESSION: Chronic changes as described. Suspect old hemorrhage affecting the thalamus and basal ganglia on the left with resultant generalized atrophic change in much of the left hemisphere. No acute findings.   Electronically Signed   By: Davonna Belling M.D.   On: 06/15/2013 00:48    EKG Interpretation    Date/Time:  Friday June 14 2013 23:01:38 EST Ventricular Rate:  72 PR Interval:  188 QRS Duration: 119 QT Interval:  417 QTC Calculation: 456 R Axis:   -50 Text Interpretation:  Sinus rhythm LVH with IVCD, LAD and secondary repol abnrm Anterior ST elevation, probably due to LVH Confirmed by [facility]  MD, DAVID ([id number]) on 06/15/2013 12:30:54 AM            MDM  No diagnosis found. 57 yo AA male  resident of SNF presents via EMS with cc of being "lethargic and unable to arouse".  Pt has MMP to include h/o stroke, anemia, HTN, CKD, DM, ICH, GIB, DVT, PVD, hyperparathyroidism, AV fistula.    Pt is in NAD.  He spontaneously opens eyes.  No lethargy or somnolensce noted.  VSS.  Satting 100% on RA with normal respiratory effort.    Will initiate evaluation to assess for emergent issues, however at this time pt is in nad and I suspect he is at his baseline.    4:00 AM  Filed Vitals:   06/15/13 0015  BP: 133/105  Pulse: 64  Temp:   Resp: 12   Doubt stroke, pneumonia, UTI, sepsis, suspect illness, emergent electrolyte abnormality, or other emergent etiology. Suspect chronic condition with possible medication influence.  Patient without issue throughout ER stay. ER workup negative.  VSS.  Pt  in nad.  He is sleeping, but easily arousable.  No evidence of emergent issue at this time.  Pt can be discharged back to nursing facility. ER precautions given.     Darlys Gales, MD 06/15/13 [phone]

## 2013-06-14 NOTE — ED Notes (Addendum)
Pt reports to the ED for eval of AMS via [facility] from a  SNF. Pt is arousable only to painful stimuli. Per pt he gets this way every night. Per EMS pts pupils pinpoint but unknown if pt has had narcotics or not. Pt was due to have his Oxycodone at 1800. Pt has hx of CVA and is non-verbal at baseline. Per SNF paper pt is normally alert but disoriented and he can normally follow simple commands. 12 lead en route showed RBBB. Pt hypertensive en route. Skin warm and resp e/u.

## 2013-06-15 ENCOUNTER — Emergency Department (HOSPITAL_COMMUNITY): Payer: PRIVATE HEALTH INSURANCE

## 2013-06-15 DIAGNOSIS — R4182 Altered mental status, unspecified: Secondary | ICD-10-CM | POA: Diagnosis not present

## 2013-06-15 LAB — URINE MICROSCOPIC-ADD ON

## 2013-06-15 LAB — CBC WITH DIFFERENTIAL/PLATELET
Eosinophils Absolute: 0 10*3/uL (ref 0.0–0.7)
HCT: 43.8 % (ref 39.0–52.0)
Hemoglobin: 13.5 g/dL (ref 13.0–17.0)
Lymphocytes Relative: 23 % (ref 12–46)
MCHC: 30.8 g/dL (ref 30.0–36.0)
MCV: 88.1 fL (ref 78.0–100.0)
Monocytes Absolute: 0.6 10*3/uL (ref 0.1–1.0)
Monocytes Relative: 12 % (ref 3–12)
Neutro Abs: 3.3 10*3/uL (ref 1.7–7.7)
WBC: 5.2 10*3/uL (ref 4.0–10.5)

## 2013-06-15 LAB — COMPREHENSIVE METABOLIC PANEL
ALT: 18 U/L (ref 0–53)
BUN: 24 mg/dL — ABNORMAL HIGH (ref 6–23)
CO2: 29 mEq/L (ref 19–32)
Chloride: 103 mEq/L (ref 96–112)
Creatinine, Ser: 1.45 mg/dL — ABNORMAL HIGH (ref 0.50–1.35)
GFR calc Af Amer: 59 mL/min — ABNORMAL LOW (ref >90)
GFR calc non Af Amer: 51 mL/min — ABNORMAL LOW (ref >90)
Potassium: 4.5 mEq/L (ref 3.5–5.1)
Total Bilirubin: 0.3 mg/dL (ref 0.3–1.2)

## 2013-06-15 LAB — URINALYSIS, ROUTINE W REFLEX MICROSCOPIC
Bilirubin Urine: NEGATIVE
Glucose, UA: NEGATIVE mg/dL
Ketones, ur: NEGATIVE mg/dL
Nitrite: NEGATIVE
Protein, ur: 30 mg/dL — AB
pH: 7.5 (ref 5.0–8.0)

## 2013-06-15 LAB — TROPONIN I: Troponin I: <0.3 ng/mL (ref <0.30)

## 2013-06-15 NOTE — ED Notes (Signed)
Report called to [facility]

## 2013-06-15 NOTE — ED Notes (Signed)
No changes, sleeping, calm, NAD, remains on O2 [place] 2L, [facility] here to transport pt back to facility.

## 2013-06-17 LAB — URINE CULTURE: Colony Count: >=100000

## 2013-06-18 NOTE — ED Notes (Signed)
Fax results to [facility] care.

## 2013-09-13 ENCOUNTER — Ambulatory Visit: Payer: Medicare Other | Admitting: Cardiovascular Disease

## 2013-09-18 ENCOUNTER — Ambulatory Visit: Payer: Medicare Other | Admitting: Cardiovascular Disease

## 2013-10-30 ENCOUNTER — Ambulatory Visit: Payer: PRIVATE HEALTH INSURANCE | Admitting: Cardiovascular Disease

## 2013-11-15 ENCOUNTER — Encounter: Payer: Self-pay | Admitting: Cardiovascular Disease

## 2014-02-06 ENCOUNTER — Encounter: Payer: Self-pay | Admitting: Gastroenterology

## 2014-06-11 ENCOUNTER — Ambulatory Visit: Payer: Self-pay | Admitting: Podiatry

## 2014-06-16 ENCOUNTER — Ambulatory Visit (INDEPENDENT_AMBULATORY_CARE_PROVIDER_SITE_OTHER): Payer: PRIVATE HEALTH INSURANCE | Admitting: Podiatry

## 2014-06-16 ENCOUNTER — Encounter: Payer: Self-pay | Admitting: Podiatry

## 2014-06-16 VITALS — BP 150/78 | HR 89 | Resp 15 | Ht 71.0 in | Wt 215.0 lb

## 2014-06-16 DIAGNOSIS — B351 Tinea unguium: Secondary | ICD-10-CM

## 2014-06-16 DIAGNOSIS — E1351 Other specified diabetes mellitus with diabetic peripheral angiopathy without gangrene: Secondary | ICD-10-CM | POA: Insufficient documentation

## 2014-06-16 NOTE — Progress Notes (Signed)
   Subjective:    Patient ID: Justin [name], male    DOB: Jan 16, 1956, 58 y.o.   MRN: [id number]  HPI Comments: N diabetic foot care and debridement L elongated toenails 10 D and O long-term  C elongate toenails, thick and painful A diabetic pt, and cerebral vascular incident T none  Pt's dtr request diabetic shoes.  Diabetes      Review of Systems  All other systems reviewed and are negative.      Objective:   Physical Exam  This patient presents with his daughter and was transferred wheelchair to treatment chair  Patient is extremely hostile and does not respond to questions  Vascular: DP pulses 1/4 bilaterally PT pulses 1/4 bilaterally  Neurological: Ankle reflexes nonreactive Sensation to 10 g monofilament wire patient not able to respond Sensation to tuning fork patient unable respond  Dermatological: The toenails are elongated, brittle, discolored 6-10  Musculoskeletal: Pes planus bilaterally Unstable gait pattern with spasticity Amputated fifth toes bilaterally        Assessment & Plan:   Assessment: Diminished pedal pulses bilaterally History of amputation of fifth digits bilaterally Diabetes with associated neuropathy Neuropathy also associated with history of intracranial hemorrhage  Plan: Debrided toenails 10  Request for diabetic shoes based on History of circulatory problems (decreased DP and PT pulses bilaterally) Pes planus Street of amputation fifth toes bilaterally Loss of deep tendon reflexes (ankles bilaterally)  History of diabetic shoes dispensed on June 21 ,2013 Dispensed 1261  11 1/2 extrawide with 3 pairs of custom molded insoles 12060 11/1/2 Rhina Brackett

## 2014-06-17 ENCOUNTER — Encounter: Payer: Self-pay | Admitting: Podiatry

## 2014-07-18 ENCOUNTER — Ambulatory Visit: Payer: Medicaid Other | Admitting: *Deleted

## 2014-07-18 DIAGNOSIS — E1351 Other specified diabetes mellitus with diabetic peripheral angiopathy without gangrene: Secondary | ICD-10-CM

## 2014-07-18 NOTE — Patient Instructions (Signed)
YOU WILL RECEIVE A PHONE CALL WHEN YOUR SHOES AND INSERTS HAVE ARRIVED TO SCHEDULE AND APPOINTMENT WITH DR Amalia Hailey

## 2014-07-18 NOTE — Progress Notes (Signed)
HERE TO MEASURE MY FEET AND CHOOSE MY DIABETIC SHOES  NEW BALANCE 813 DOUBLE STRAP [id number] 11.5 W

## 2014-10-18 ENCOUNTER — Inpatient Hospital Stay (HOSPITAL_COMMUNITY)
Admission: EM | Admit: 2014-10-18 | Discharge: 2014-10-21 | DRG: 682 | Disposition: A | Discharge status: Skilled Nursing Facility | Payer: Medicare Other | Attending: Internal Medicine | Admitting: Internal Medicine

## 2014-10-18 ENCOUNTER — Encounter (HOSPITAL_COMMUNITY): Payer: Self-pay | Admitting: Emergency Medicine

## 2014-10-18 ENCOUNTER — Emergency Department (HOSPITAL_COMMUNITY): Payer: Medicare Other

## 2014-10-18 DIAGNOSIS — Z86718 Personal history of other venous thrombosis and embolism: Secondary | ICD-10-CM

## 2014-10-18 DIAGNOSIS — Z87891 Personal history of nicotine dependence: Secondary | ICD-10-CM

## 2014-10-18 DIAGNOSIS — Z881 Allergy status to other antibiotic agents status: Secondary | ICD-10-CM

## 2014-10-18 DIAGNOSIS — N183 Chronic kidney disease, stage 3 (moderate): Secondary | ICD-10-CM | POA: Diagnosis present

## 2014-10-18 DIAGNOSIS — I129 Hypertensive chronic kidney disease with stage 1 through stage 4 chronic kidney disease, or unspecified chronic kidney disease: Secondary | ICD-10-CM | POA: Diagnosis present

## 2014-10-18 DIAGNOSIS — G5600 Carpal tunnel syndrome, unspecified upper limb: Secondary | ICD-10-CM | POA: Diagnosis present

## 2014-10-18 DIAGNOSIS — D649 Anemia, unspecified: Secondary | ICD-10-CM | POA: Diagnosis present

## 2014-10-18 DIAGNOSIS — Z885 Allergy status to narcotic agent status: Secondary | ICD-10-CM

## 2014-10-18 DIAGNOSIS — I629 Nontraumatic intracranial hemorrhage, unspecified: Secondary | ICD-10-CM

## 2014-10-18 DIAGNOSIS — N179 Acute kidney failure, unspecified: Secondary | ICD-10-CM | POA: Diagnosis not present

## 2014-10-18 DIAGNOSIS — G8191 Hemiplegia, unspecified affecting right dominant side: Secondary | ICD-10-CM | POA: Diagnosis present

## 2014-10-18 DIAGNOSIS — Z91040 Latex allergy status: Secondary | ICD-10-CM

## 2014-10-18 DIAGNOSIS — R7989 Other specified abnormal findings of blood chemistry: Secondary | ICD-10-CM | POA: Diagnosis present

## 2014-10-18 DIAGNOSIS — Z794 Long term (current) use of insulin: Secondary | ICD-10-CM

## 2014-10-18 DIAGNOSIS — R627 Adult failure to thrive: Secondary | ICD-10-CM | POA: Diagnosis present

## 2014-10-18 DIAGNOSIS — E875 Hyperkalemia: Secondary | ICD-10-CM | POA: Diagnosis not present

## 2014-10-18 DIAGNOSIS — R1310 Dysphagia, unspecified: Secondary | ICD-10-CM | POA: Diagnosis present

## 2014-10-18 DIAGNOSIS — E785 Hyperlipidemia, unspecified: Secondary | ICD-10-CM | POA: Diagnosis present

## 2014-10-18 DIAGNOSIS — Z940 Kidney transplant status: Secondary | ICD-10-CM

## 2014-10-18 DIAGNOSIS — I739 Peripheral vascular disease, unspecified: Secondary | ICD-10-CM | POA: Diagnosis present

## 2014-10-18 DIAGNOSIS — Z880 Allergy status to penicillin: Secondary | ICD-10-CM

## 2014-10-18 DIAGNOSIS — F05 Delirium due to known physiological condition: Secondary | ICD-10-CM | POA: Insufficient documentation

## 2014-10-18 DIAGNOSIS — Z888 Allergy status to other drugs, medicaments and biological substances status: Secondary | ICD-10-CM

## 2014-10-18 DIAGNOSIS — Z7401 Bed confinement status: Secondary | ICD-10-CM

## 2014-10-18 DIAGNOSIS — E119 Type 2 diabetes mellitus without complications: Secondary | ICD-10-CM

## 2014-10-18 DIAGNOSIS — N390 Urinary tract infection, site not specified: Secondary | ICD-10-CM | POA: Insufficient documentation

## 2014-10-18 DIAGNOSIS — I10 Essential (primary) hypertension: Secondary | ICD-10-CM | POA: Diagnosis present

## 2014-10-18 DIAGNOSIS — Z993 Dependence on wheelchair: Secondary | ICD-10-CM

## 2014-10-18 DIAGNOSIS — E213 Hyperparathyroidism, unspecified: Secondary | ICD-10-CM | POA: Diagnosis present

## 2014-10-18 DIAGNOSIS — R778 Other specified abnormalities of plasma proteins: Secondary | ICD-10-CM | POA: Diagnosis present

## 2014-10-18 DIAGNOSIS — J189 Pneumonia, unspecified organism: Secondary | ICD-10-CM | POA: Diagnosis present

## 2014-10-18 LAB — CBG MONITORING, ED: Glucose-Capillary: 129 mg/dL — ABNORMAL HIGH (ref 70–99)

## 2014-10-18 LAB — CBC WITH DIFFERENTIAL/PLATELET
Basophils Absolute: 0 10*3/uL (ref 0.0–0.1)
Basophils Relative: 0 % (ref 0–1)
EOS PCT: 1 % (ref 0–5)
Eosinophils Absolute: 0 10*3/uL (ref 0.0–0.7)
HEMATOCRIT: 51.9 % (ref 39.0–52.0)
HEMOGLOBIN: 16.1 g/dL (ref 13.0–17.0)
LYMPHS PCT: 21 % (ref 12–46)
Lymphs Abs: 1.3 10*3/uL (ref 0.7–4.0)
MCH: 27.7 pg (ref 26.0–34.0)
MCHC: 31 g/dL (ref 30.0–36.0)
MCV: 89.3 fL (ref 78.0–100.0)
Monocytes Absolute: 0.4 10*3/uL (ref 0.1–1.0)
Monocytes Relative: 6 % (ref 3–12)
NEUTROS ABS: 4.6 10*3/uL (ref 1.7–7.7)
NEUTROS PCT: 72 % (ref 43–77)
Platelets: 169 10*3/uL (ref 150–400)
RBC: 5.81 MIL/uL (ref 4.22–5.81)
RDW: 14.5 % (ref 11.5–15.5)
WBC: 6.3 10*3/uL (ref 4.0–10.5)

## 2014-10-18 LAB — URINE MICROSCOPIC-ADD ON

## 2014-10-18 LAB — COMPREHENSIVE METABOLIC PANEL
ALBUMIN: 2.9 g/dL — AB (ref 3.5–5.2)
ALK PHOS: 84 U/L (ref 39–117)
ALT: 20 U/L (ref 0–53)
AST: 48 U/L — ABNORMAL HIGH (ref 0–37)
Anion gap: 13 (ref 5–15)
BILIRUBIN TOTAL: 1.6 mg/dL — AB (ref 0.3–1.2)
BUN: 36 mg/dL — AB (ref 6–23)
CHLORIDE: 107 mmol/L (ref 96–112)
CO2: 24 mmol/L (ref 19–32)
CREATININE: 1.93 mg/dL — AB (ref 0.50–1.35)
Calcium: 10.7 mg/dL — ABNORMAL HIGH (ref 8.4–10.5)
GFR calc Af Amer: 42 mL/min — ABNORMAL LOW (ref >90)
GFR calc non Af Amer: 36 mL/min — ABNORMAL LOW (ref >90)
GLUCOSE: 137 mg/dL — AB (ref 70–99)
Potassium: 5.7 mmol/L — ABNORMAL HIGH (ref 3.5–5.1)
SODIUM: 144 mmol/L (ref 135–145)
TOTAL PROTEIN: 8 g/dL (ref 6.0–8.3)

## 2014-10-18 LAB — URINALYSIS, ROUTINE W REFLEX MICROSCOPIC
Bilirubin Urine: NEGATIVE
GLUCOSE, UA: NEGATIVE mg/dL
Ketones, ur: 15 mg/dL — AB
NITRITE: NEGATIVE
Protein, ur: 30 mg/dL — AB
Specific Gravity, Urine: 1.015 (ref 1.005–1.030)
UROBILINOGEN UA: 1 mg/dL (ref 0.0–1.0)
pH: 6.5 (ref 5.0–8.0)

## 2014-10-18 LAB — TROPONIN I: TROPONIN I: 0.11 ng/mL — AB (ref <0.031)

## 2014-10-18 MED ORDER — DEXTROSE 5 % IV SOLN
1.0000 g | Freq: Once | INTRAVENOUS | Status: AC
  Administered 2014-10-18: 1 g via INTRAVENOUS
  Filled 2014-10-18: qty 10

## 2014-10-18 NOTE — ED Notes (Addendum)
Pt arrives from [facility] with poor po intake over the last several days. Refusing oral meds and meals at this time. Extensive hx, including hemorrhagic CVA, DM, CKD. Nonverbal at baseline, active and awake but disoriented. Minimal incomprehensible speech. Pt has bilateral dialysis fistulas however is not on dialysis at this time. Pt is FULL CODE. SNF attempting IV hydration and pt pulled out IV.

## 2014-10-18 NOTE — ED Notes (Signed)
EKG completed and given to EDP.  

## 2014-10-18 NOTE — ED Provider Notes (Signed)
CSN: [id number]     Arrival date & time 10/18/14  2006 History   First MD Initiated Contact with Patient 10/18/14 2020     Chief Complaint  Patient presents with  . Failure To Thrive   Level V caveat: Patient is nonverbal, disoriented at baseline. History of present illness obtained from nursing staff and EMS notes.  (Consider location/radiation/quality/duration/timing/severity/associated sxs/prior Treatment) HPI Justin [name] is a 59 y.o. male with significant past medical history for a hemorrhagic CVA, DM, CAD, status post renal transplant and arrives from [place] care with poor by mouth intake over last few days. Patient refuses taking his medications and eating meals at this time. Reported at SNF attempted IV hydration and patient pulled out IV. Patient is currently resting quietly in ED, watching television in no apparent distress. Spoke with Faith, pt's daughter. Patient is full code Past Medical History  Diagnosis Date  . Hypertension   . Dyslipidemia   . Carpal tunnel syndrome   . A-V fistula   . Anemia   . Hemorrhoids   . Hyperparathyroidism   . Intracranial hemorrhage   . Hypotension   . Hyperkalemia   . Kidney disease   . Sepsis(995.91)   . GI bleeding   . DVT (deep venous thrombosis)   . PVD (peripheral vascular disease)   . DM (diabetes mellitus)   . Cellulitis   . IV infiltration     Right upper extremity  . PPD positive   . 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 Hypokinesis     EF of 60% with mild anterior hypokinesis, minimal coronary artery disease on catheterization in December of 2007   Past Surgical History  Procedure Laterality Date  . Kidney transplant      History of renal transplant maintained on chronic immunosuppressive therapy  . Av fistula placement      Right arm  . Peripherally inserted central catheter insertion      line placement  . Amputation    . Gastrostomy    . Ivc filter     bilateral 5th toe amputation  . Brain biopsy    . #6 shiley cuff      Less trache   Family History  Problem Relation Age of Onset  . Heart disease Mother    History  Substance Use Topics  . Smoking status: Former Smoker    Quit date: 01/06/2001  . Smokeless tobacco: Not on file  . Alcohol Use: No    Review of Systems  Unable to perform ROS     Allergies  Amoxicillin; Ampicillin; Latex; Morphine and related; Penicillins; and Tape  Home Medications   Prior to Admission medications   Medication Sig Start Date End Date Taking? Authorizing Provider  allopurinol (ZYLOPRIM) 100 MG tablet Take 100 mg by mouth daily.     Yes Historical Provider, MD  aspirin 81 MG tablet Take 81 mg by mouth daily.     Yes Historical Provider, MD  atorvastatin (LIPITOR) 10 MG tablet Take 10 mg by mouth daily.   Yes Historical Provider, MD  baclofen (LIORESAL) 20 MG tablet Take 20 mg by mouth 3 (three) times daily.   Yes Historical Provider, MD  cefTRIAXone (ROCEPHIN) 1 G injection Inject 1 g into the muscle daily.   Yes Historical Provider, MD  citalopram (CELEXA) 10 MG tablet Take 20 mg by mouth daily.    Yes Historical Provider, MD  Cranberry 425 MG CAPS  Take 425 mg by mouth daily.   Yes Historical Provider, MD  Dextrose-Sodium Chloride (DEXTROSE 5 % AND 0.45% NACL) 5-0.45 % Inject 50 mLs into the vein 2 (two) times daily. 65ml/hr 0900 and 1800   Yes Historical Provider, MD  fentaNYL (DURAGESIC - DOSED MCG/HR) 25 MCG/HR patch Place 25 mcg onto the skin every 3 (three) days.   Yes Historical Provider, MD  gabapentin (NEURONTIN) 300 MG capsule Take 300 mg by mouth 3 (three) times daily.  05/20/13  Yes Historical Provider, MD  hydrALAZINE (APRESOLINE) 25 MG tablet Take 25 mg by mouth 2 (two) times daily.   Yes Historical Provider, MD  insulin glargine (LANTUS) 100 unit/mL SOPN Inject 35 Units into the skin at bedtime.   Yes Historical Provider, MD  ipratropium-albuterol (DUONEB) 0.5-2.5 (3) MG/3ML SOLN  Take 3 mLs by nebulization every 6 (six) hours as needed (wheezing).   Yes Historical Provider, MD  isosorbide dinitrate (ISORDIL) 10 MG tablet Take 10 mg by mouth 2 (two) times daily.   Yes Historical Provider, MD  levETIRAcetam (KEPPRA) 500 MG tablet Take 500 mg by mouth every 12 (twelve) hours.     Yes Historical Provider, MD  LORazepam (ATIVAN) 0.5 MG tablet Take 0.5 mg by mouth every 8 (eight) hours as needed. Anxiety   Yes Historical Provider, MD  LORazepam (ATIVAN) 2 MG/ML injection Inject 1 mg into the vein every 6 (six) hours as needed for anxiety.   Yes Historical Provider, MD  omeprazole (PRILOSEC) 20 MG capsule Take 20 mg by mouth daily.     Yes Historical Provider, MD  oxyCODONE-acetaminophen (PERCOCET) 7.5-325 MG per tablet Take 1 tablet by mouth 3 (three) times daily.   Yes Historical Provider, MD  Potassium & Sodium Phosphates (PHOS-NAK PO) Take 1 Package by mouth 3 (three) times daily.   Yes Historical Provider, MD  predniSONE (DELTASONE) 5 MG tablet Take 5 mg by mouth daily.    Yes Historical Provider, MD  senna-docusate (SENOKOT-S) 8.6-50 MG per tablet Take 1 tablet by mouth daily.   Yes Historical Provider, MD  tacrolimus (PROGRAF) 0.5 MG capsule Take 0.5 mg by mouth 2 (two) times daily.   Yes Historical Provider, MD  tacrolimus (PROGRAF) 1 MG capsule Take 3 mg by mouth 2 (two) times daily.   Yes Historical Provider, MD  tiZANidine (ZANAFLEX) 2 MG tablet Take 2 mg by mouth 2 (two) times daily.  05/26/13  Yes Historical Provider, MD  Vitamin D, Ergocalciferol, (DRISDOL) 50000 UNITS CAPS Take 50,000 Units by mouth every 30 (thirty) days.   Yes Historical Provider, MD  albuterol (PROVENTIL) (2.5 MG/3ML) 0.083% nebulizer solution  05/08/13   Historical Provider, MD  amLODipine (NORVASC) 10 MG tablet Take 10 mg by mouth daily.      Historical Provider, MD  HYDROcodone-acetaminophen (VICODIN) 5-500 MG per tablet Take 1 tablet by mouth every 6 (six) hours as needed.      Historical  Provider, MD  insulin aspart (NOVOLOG) 100 UNIT/ML injection Inject 9 Units into the skin 3 (three) times daily before meals.     Historical Provider, MD  insulin glargine (LANTUS) 100 UNIT/ML injection Inject 31 Units into the skin at bedtime.     Historical Provider, MD  oxyCODONE-acetaminophen (PERCOCET/ROXICET) 5-325 MG per tablet Take 1 tablet by mouth 2 (two) times daily as needed for moderate pain.  05/01/13   Historical Provider, MD  polyethylene glycol (MIRALAX / GLYCOLAX) packet Take 17 g by mouth as directed.      Historical  Provider, MD   BP 181/85 mmHg  Pulse 95  Temp(Src) 98.7 F (37.1 C) (Rectal)  Resp 19  SpO2 97% Physical Exam  Constitutional: He is oriented to person, place, and time. He appears well-developed and well-nourished.  HENT:  Head: Normocephalic and atraumatic.  Mouth/Throat: Oropharynx is clear and moist.  Eyes: Conjunctivae are normal. Pupils are equal, round, and reactive to light. Right eye exhibits no discharge. Left eye exhibits no discharge. No scleral icterus.  Neck: Normal range of motion. Neck supple.  Cardiovascular: Normal rate, regular rhythm and normal heart sounds.   Pulmonary/Chest: Effort normal and breath sounds normal. No respiratory distress. He has no wheezes. He has no rales.  Abdominal: Soft. There is no tenderness.  Genitourinary:  No Foley catheter. Diffuse ulcerations noted to anterior penis and pubis with chronic scarring.  Musculoskeletal: Normal range of motion. He exhibits no tenderness.  Neurological: He is alert and oriented to person, place, and time.  Cranial Nerves II-XII grossly intact  Skin: Skin is warm and dry. No rash noted.  Psychiatric: He has a normal mood and affect.  Nursing note and vitals reviewed.   ED Course  Procedures (including critical care time) Labs Review Labs Reviewed  COMPREHENSIVE METABOLIC PANEL - Abnormal; Notable for the following:    Potassium 5.7 (*)    Glucose, Bld 137 (*)    BUN 36  (*)    Creatinine, Ser 1.93 (*)    Calcium 10.7 (*)    Albumin 2.9 (*)    AST 48 (*)    Total Bilirubin 1.6 (*)    GFR calc non Af Amer 36 (*)    GFR calc Af Amer 42 (*)    All other components within normal limits  URINALYSIS, ROUTINE W REFLEX MICROSCOPIC - Abnormal; Notable for the following:    APPearance CLOUDY (*)    Hgb urine dipstick LARGE (*)    Ketones, ur 15 (*)    Protein, ur 30 (*)    Leukocytes, UA LARGE (*)    All other components within normal limits  TROPONIN I - Abnormal; Notable for the following:    Troponin I 0.11 (*)    All other components within normal limits  URINE MICROSCOPIC-ADD ON - Abnormal; Notable for the following:    Squamous Epithelial / LPF FEW (*)    Bacteria, UA FEW (*)    All other components within normal limits  CBG MONITORING, ED - Abnormal; Notable for the following:    Glucose-Capillary 129 (*)    All other components within normal limits  URINE CULTURE  CBC WITH DIFFERENTIAL/PLATELET    Imaging Review Dg Chest 2 View  10/18/2014   CLINICAL DATA:  Patient with failure to thrive. History of hypertension and diabetes.  EXAM: CHEST  2 VIEW  COMPARISON:  Chest radiograph 06/15/2013  FINDINGS: Stable cardiac and mediastinal contours. Low lung volumes. Minimal heterogeneous opacities left lung base. No pleural effusion or pneumothorax. Regional skeleton is unremarkable.  IMPRESSION: Minimal left basilar heterogeneous opacities favored to represent atelectasis.  Low lung volumes.   Electronically Signed   By: Lovey Newcomer M.D.   On: 10/18/2014 22:07     EKG Interpretation   Date/Time:  Saturday October 18 2014 23:30:40 EDT Ventricular Rate:  95 PR Interval:  161 QRS Duration: 120 QT Interval:  368 QTC Calculation: 463 R Axis:   -23 Text Interpretation:  Sinus rhythm Nonspecific intraventricular conduction  delay Anteroseptal infarct, old Borderline repolarization abnormality  Baseline wander in  lead(s) V3 V4 Confirmed by Hazle Coca [id number])  on  10/18/2014 11:44:20 PM     Meds given in ED:  Medications  sodium chloride 0.9 % bolus 1,000 mL (not administered)  insulin aspart (novoLOG) injection 5 Units (not administered)  dextrose 50 % solution 50 mL (not administered)  sodium polystyrene (KAYEXALATE) 15 GM/60ML suspension 30 g (not administered)  cefTRIAXone (ROCEPHIN) 1 g in dextrose 5 % 50 mL IVPB (0 g Intravenous Stopped 10/19/14 0100)    New Prescriptions   No medications on file   Filed Vitals:   10/18/14 2230 10/18/14 2300 10/18/14 2315 10/18/14 2330  BP: 158/87 159/97 142/76 181/85  Pulse: 94 92 92 95  Temp:      TempSrc:      Resp: 24 21 15 19   SpO2: 98% 97% 96% 97%    MDM  Vitals stable - WNL -afebrile Pt resting comfortably in ED. PE-what appeared to be ulcerations anterior penis and pubis, potentially secondary to chronic irritation from wet diapers. physical exam otherwise is grossly benign. Troponin 0.11, new EKG changes. Spoke with cardiology, will consult the patient Labwork--K 5.7, given insulin and dextrose. Also evidence of UTI on urinalysis. Also evidence of new kidney insufficiency. Creatinine 1.93 up from 1.45 in 12/14. Imaging--chest x-ray shows no acute cardio pulmonary pathology.   Patient treated for UTI with 1 g ceftriaxone. Receiving IV fluids.  Discussed patient presentation and ED course with attending, Dr. Ralene Bathe who also saw and evaluated the patient. Agrees with plan for admission for further evaluation of elevated troponins, EKG changes and management of UTI. Consult to IM, Dr. Arnoldo Morale, pt admitted. Requests administration of Kayexalate, will oblige.  Final diagnoses:  Hyperkalemia  UTI (lower urinary tract infection)  Elevated troponin I level        Comer Locket, PA-C 10/19/14 0106  Quintella Reichert, MD 10/19/14 [phone]

## 2014-10-19 DIAGNOSIS — Z993 Dependence on wheelchair: Secondary | ICD-10-CM | POA: Diagnosis not present

## 2014-10-19 DIAGNOSIS — E118 Type 2 diabetes mellitus with unspecified complications: Secondary | ICD-10-CM | POA: Diagnosis not present

## 2014-10-19 DIAGNOSIS — I10 Essential (primary) hypertension: Secondary | ICD-10-CM | POA: Diagnosis not present

## 2014-10-19 DIAGNOSIS — Z86718 Personal history of other venous thrombosis and embolism: Secondary | ICD-10-CM | POA: Diagnosis not present

## 2014-10-19 DIAGNOSIS — J189 Pneumonia, unspecified organism: Secondary | ICD-10-CM | POA: Diagnosis present

## 2014-10-19 DIAGNOSIS — N179 Acute kidney failure, unspecified: Secondary | ICD-10-CM | POA: Diagnosis present

## 2014-10-19 DIAGNOSIS — Z7401 Bed confinement status: Secondary | ICD-10-CM | POA: Diagnosis not present

## 2014-10-19 DIAGNOSIS — R7989 Other specified abnormal findings of blood chemistry: Secondary | ICD-10-CM | POA: Diagnosis not present

## 2014-10-19 DIAGNOSIS — Z885 Allergy status to narcotic agent status: Secondary | ICD-10-CM | POA: Diagnosis not present

## 2014-10-19 DIAGNOSIS — R1310 Dysphagia, unspecified: Secondary | ICD-10-CM | POA: Diagnosis present

## 2014-10-19 DIAGNOSIS — N390 Urinary tract infection, site not specified: Secondary | ICD-10-CM | POA: Diagnosis present

## 2014-10-19 DIAGNOSIS — Z880 Allergy status to penicillin: Secondary | ICD-10-CM | POA: Diagnosis not present

## 2014-10-19 DIAGNOSIS — R627 Adult failure to thrive: Secondary | ICD-10-CM | POA: Diagnosis present

## 2014-10-19 DIAGNOSIS — I739 Peripheral vascular disease, unspecified: Secondary | ICD-10-CM | POA: Diagnosis present

## 2014-10-19 DIAGNOSIS — I129 Hypertensive chronic kidney disease with stage 1 through stage 4 chronic kidney disease, or unspecified chronic kidney disease: Secondary | ICD-10-CM | POA: Diagnosis present

## 2014-10-19 DIAGNOSIS — E213 Hyperparathyroidism, unspecified: Secondary | ICD-10-CM | POA: Diagnosis present

## 2014-10-19 DIAGNOSIS — F05 Delirium due to known physiological condition: Secondary | ICD-10-CM | POA: Insufficient documentation

## 2014-10-19 DIAGNOSIS — Z881 Allergy status to other antibiotic agents status: Secondary | ICD-10-CM | POA: Diagnosis not present

## 2014-10-19 DIAGNOSIS — N183 Chronic kidney disease, stage 3 (moderate): Secondary | ICD-10-CM | POA: Diagnosis present

## 2014-10-19 DIAGNOSIS — R778 Other specified abnormalities of plasma proteins: Secondary | ICD-10-CM | POA: Diagnosis present

## 2014-10-19 DIAGNOSIS — Z940 Kidney transplant status: Secondary | ICD-10-CM

## 2014-10-19 DIAGNOSIS — Z87891 Personal history of nicotine dependence: Secondary | ICD-10-CM | POA: Diagnosis not present

## 2014-10-19 DIAGNOSIS — E875 Hyperkalemia: Secondary | ICD-10-CM | POA: Diagnosis present

## 2014-10-19 DIAGNOSIS — E785 Hyperlipidemia, unspecified: Secondary | ICD-10-CM | POA: Diagnosis present

## 2014-10-19 DIAGNOSIS — G5600 Carpal tunnel syndrome, unspecified upper limb: Secondary | ICD-10-CM | POA: Diagnosis present

## 2014-10-19 DIAGNOSIS — E119 Type 2 diabetes mellitus without complications: Secondary | ICD-10-CM | POA: Diagnosis present

## 2014-10-19 DIAGNOSIS — G8191 Hemiplegia, unspecified affecting right dominant side: Secondary | ICD-10-CM | POA: Diagnosis present

## 2014-10-19 DIAGNOSIS — Z888 Allergy status to other drugs, medicaments and biological substances status: Secondary | ICD-10-CM | POA: Diagnosis not present

## 2014-10-19 DIAGNOSIS — Z91040 Latex allergy status: Secondary | ICD-10-CM | POA: Diagnosis not present

## 2014-10-19 DIAGNOSIS — D649 Anemia, unspecified: Secondary | ICD-10-CM | POA: Diagnosis present

## 2014-10-19 DIAGNOSIS — Z794 Long term (current) use of insulin: Secondary | ICD-10-CM | POA: Diagnosis not present

## 2014-10-19 DIAGNOSIS — I629 Nontraumatic intracranial hemorrhage, unspecified: Secondary | ICD-10-CM

## 2014-10-19 HISTORY — DX: Kidney transplant status: Z94.0

## 2014-10-19 LAB — BASIC METABOLIC PANEL
Anion gap: 10 (ref 5–15)
BUN: 33 mg/dL — AB (ref 6–23)
CO2: 27 mmol/L (ref 19–32)
CREATININE: 1.75 mg/dL — AB (ref 0.50–1.35)
Calcium: 10 mg/dL (ref 8.4–10.5)
Chloride: 107 mmol/L (ref 96–112)
GFR calc Af Amer: 47 mL/min — ABNORMAL LOW (ref >90)
GFR, EST NON AFRICAN AMERICAN: 41 mL/min — AB (ref >90)
GLUCOSE: 135 mg/dL — AB (ref 70–99)
Potassium: 4.1 mmol/L (ref 3.5–5.1)
SODIUM: 144 mmol/L (ref 135–145)

## 2014-10-19 LAB — GLUCOSE, CAPILLARY
GLUCOSE-CAPILLARY: 146 mg/dL — AB (ref 70–99)
GLUCOSE-CAPILLARY: 163 mg/dL — AB (ref 70–99)
Glucose-Capillary: 117 mg/dL — ABNORMAL HIGH (ref 70–99)
Glucose-Capillary: 120 mg/dL — ABNORMAL HIGH (ref 70–99)
Glucose-Capillary: 157 mg/dL — ABNORMAL HIGH (ref 70–99)

## 2014-10-19 LAB — CBC
HCT: 50.5 % (ref 39.0–52.0)
HEMOGLOBIN: 15 g/dL (ref 13.0–17.0)
MCH: 26.8 pg (ref 26.0–34.0)
MCHC: 29.7 g/dL — ABNORMAL LOW (ref 30.0–36.0)
MCV: 90.3 fL (ref 78.0–100.0)
Platelets: 160 10*3/uL (ref 150–400)
RBC: 5.59 MIL/uL (ref 4.22–5.81)
RDW: 14.1 % (ref 11.5–15.5)
WBC: 6 10*3/uL (ref 4.0–10.5)

## 2014-10-19 LAB — MRSA PCR SCREENING: MRSA by PCR: POSITIVE — AB

## 2014-10-19 MED ORDER — LORAZEPAM 0.5 MG PO TABS: 0.5000 mg | ORAL_TABLET | Freq: Three times a day (TID) | ORAL | Status: DC | PRN

## 2014-10-19 MED ORDER — LEVETIRACETAM 500 MG PO TABS
500.0000 mg | ORAL_TABLET | Freq: Two times a day (BID) | ORAL | Status: DC
  Administered 2014-10-19: 500 mg via ORAL
  Filled 2014-10-19 (×4): qty 1

## 2014-10-19 MED ORDER — HEPARIN SODIUM (PORCINE) 5000 UNIT/ML IJ SOLN
5000.0000 [IU] | Freq: Three times a day (TID) | INTRAMUSCULAR | Status: DC
  Administered 2014-10-19 – 2014-10-21 (×3): 5000 [IU] via SUBCUTANEOUS
  Filled 2014-10-19 (×9): qty 1

## 2014-10-19 MED ORDER — MUPIROCIN 2 % EX OINT
1.0000 "application " | TOPICAL_OINTMENT | Freq: Two times a day (BID) | CUTANEOUS | Status: DC
  Administered 2014-10-19 – 2014-10-21 (×3): 1 via NASAL
  Filled 2014-10-19 (×2): qty 22

## 2014-10-19 MED ORDER — GABAPENTIN 300 MG PO CAPS
300.0000 mg | ORAL_CAPSULE | Freq: Three times a day (TID) | ORAL | Status: DC
  Administered 2014-10-19: 300 mg via ORAL
  Filled 2014-10-19 (×4): qty 1

## 2014-10-19 MED ORDER — ACETAMINOPHEN 650 MG RE SUPP: 650.0000 mg | Freq: Four times a day (QID) | RECTAL | Status: DC | PRN

## 2014-10-19 MED ORDER — ONDANSETRON HCL 4 MG PO TABS
4.0000 mg | ORAL_TABLET | Freq: Four times a day (QID) | ORAL | Status: DC | PRN
Start: 2014-10-19 — End: 2014-10-19

## 2014-10-19 MED ORDER — TACROLIMUS 1 MG PO CAPS
3.5000 mg | ORAL_CAPSULE | Freq: Two times a day (BID) | ORAL | Status: DC
  Administered 2014-10-19: 3.5 mg via ORAL
  Filled 2014-10-19 (×3): qty 1

## 2014-10-19 MED ORDER — CRANBERRY 425 MG PO CAPS: 425.0000 mg | ORAL_CAPSULE | Freq: Every day | ORAL | Status: DC

## 2014-10-19 MED ORDER — ISOSORBIDE DINITRATE 10 MG PO TABS
10.0000 mg | ORAL_TABLET | Freq: Two times a day (BID) | ORAL | Status: DC
  Administered 2014-10-19: 10 mg via ORAL
  Filled 2014-10-19 (×3): qty 1

## 2014-10-19 MED ORDER — PREDNISONE 5 MG/5ML PO SOLN
5.0000 mg | Freq: Every day | ORAL | Status: DC
  Administered 2014-10-19 – 2014-10-20 (×2): 5 mg via ORAL
  Filled 2014-10-19 (×5): qty 5

## 2014-10-19 MED ORDER — PANTOPRAZOLE SODIUM 40 MG IV SOLR
40.0000 mg | INTRAVENOUS | Status: DC
  Administered 2014-10-19 – 2014-10-20 (×2): 40 mg via INTRAVENOUS
  Filled 2014-10-19 (×3): qty 40

## 2014-10-19 MED ORDER — ONDANSETRON HCL 4 MG/2ML IJ SOLN: 4.0000 mg | Freq: Four times a day (QID) | INTRAMUSCULAR | Status: DC | PRN

## 2014-10-19 MED ORDER — TACROLIMUS 1 MG PO CAPS: 3.0000 mg | ORAL_CAPSULE | Freq: Two times a day (BID) | ORAL | Status: DC

## 2014-10-19 MED ORDER — PREDNISONE 5 MG/ML PO CONC
5.0000 mg | Freq: Every day | ORAL | Status: DC
  Filled 2014-10-19 (×2): qty 1

## 2014-10-19 MED ORDER — LORAZEPAM 2 MG/ML IJ SOLN
0.5000 mg | Freq: Four times a day (QID) | INTRAMUSCULAR | Status: DC | PRN
  Administered 2014-10-21: 0.5 mg via INTRAVENOUS
  Filled 2014-10-19: qty 1

## 2014-10-19 MED ORDER — SODIUM CHLORIDE 0.9 % IV SOLN
500.0000 mg | Freq: Two times a day (BID) | INTRAVENOUS | Status: DC
  Administered 2014-10-19 – 2014-10-21 (×4): 500 mg via INTRAVENOUS
  Filled 2014-10-19 (×6): qty 5

## 2014-10-19 MED ORDER — DEXTROSE 50 % IV SOLN
50.0000 mL | Freq: Once | INTRAVENOUS | Status: AC
  Administered 2014-10-19: 50 mL via INTRAVENOUS
  Filled 2014-10-19: qty 50

## 2014-10-19 MED ORDER — INSULIN GLARGINE 100 UNIT/ML ~~LOC~~ SOLN
15.0000 [IU] | Freq: Every day | SUBCUTANEOUS | Status: DC
  Administered 2014-10-19 – 2014-10-20 (×3): 15 [IU] via SUBCUTANEOUS
  Filled 2014-10-19 (×4): qty 0.15

## 2014-10-19 MED ORDER — SODIUM CHLORIDE 0.9 % IV SOLN
INTRAVENOUS | Status: DC
  Administered 2014-10-19: 50 mL/h via INTRAVENOUS
  Administered 2014-10-20: 20:00:00 via INTRAVENOUS

## 2014-10-19 MED ORDER — FENTANYL 25 MCG/HR TD PT72
25.0000 ug | MEDICATED_PATCH | TRANSDERMAL | Status: DC
  Administered 2014-10-20: 25 ug via TRANSDERMAL
  Filled 2014-10-19: qty 1

## 2014-10-19 MED ORDER — HYDRALAZINE HCL 25 MG PO TABS
25.0000 mg | ORAL_TABLET | Freq: Two times a day (BID) | ORAL | Status: DC
  Administered 2014-10-19: 25 mg via ORAL
  Filled 2014-10-19 (×3): qty 1

## 2014-10-19 MED ORDER — SODIUM POLYSTYRENE SULFONATE 15 GM/60ML PO SUSP
30.0000 g | Freq: Once | ORAL | Status: DC
  Filled 2014-10-19: qty 120

## 2014-10-19 MED ORDER — TACROLIMUS 0.5 MG PO CAPS
0.5000 mg | ORAL_CAPSULE | Freq: Two times a day (BID) | ORAL | Status: DC
Start: 2014-10-19 — End: 2014-10-19

## 2014-10-19 MED ORDER — CHLORHEXIDINE GLUCONATE CLOTH 2 % EX PADS
6.0000 | MEDICATED_PAD | Freq: Every day | CUTANEOUS | Status: DC
  Administered 2014-10-20 – 2014-10-21 (×2): 6 via TOPICAL

## 2014-10-19 MED ORDER — DEXTROSE 5 % IV SOLN
1.0000 g | INTRAVENOUS | Status: DC
  Administered 2014-10-20 (×2): 1 g via INTRAVENOUS
  Filled 2014-10-19 (×3): qty 10

## 2014-10-19 MED ORDER — ALLOPURINOL 100 MG PO TABS
100.0000 mg | ORAL_TABLET | Freq: Every day | ORAL | Status: DC
  Filled 2014-10-19: qty 1

## 2014-10-19 MED ORDER — SODIUM CHLORIDE 0.9 % IJ SOLN
3.0000 mL | Freq: Two times a day (BID) | INTRAMUSCULAR | Status: DC
  Administered 2014-10-19 (×2): 3 mL via INTRAVENOUS

## 2014-10-19 MED ORDER — TACROLIMUS 1 MG/ML ORAL SUSPENSION
3.5000 mg | Freq: Two times a day (BID) | ORAL | Status: DC
  Administered 2014-10-19 – 2014-10-21 (×3): 3.5 mg via ORAL
  Filled 2014-10-19 (×6): qty 3.5

## 2014-10-19 MED ORDER — ACETAMINOPHEN 325 MG PO TABS: 650.0000 mg | ORAL_TABLET | Freq: Four times a day (QID) | ORAL | Status: DC | PRN

## 2014-10-19 MED ORDER — BACLOFEN 20 MG PO TABS
20.0000 mg | ORAL_TABLET | Freq: Three times a day (TID) | ORAL | Status: DC
  Administered 2014-10-19: 20 mg via ORAL
  Filled 2014-10-19 (×4): qty 1

## 2014-10-19 MED ORDER — ATORVASTATIN CALCIUM 10 MG PO TABS
10.0000 mg | ORAL_TABLET | Freq: Every day | ORAL | Status: DC
Start: 2014-10-19 — End: 2014-10-19
  Filled 2014-10-19: qty 1

## 2014-10-19 MED ORDER — CITALOPRAM HYDROBROMIDE 20 MG PO TABS
20.0000 mg | ORAL_TABLET | Freq: Every day | ORAL | Status: DC
  Filled 2014-10-19: qty 1

## 2014-10-19 MED ORDER — HYDROMORPHONE HCL 1 MG/ML IJ SOLN
0.5000 mg | INTRAMUSCULAR | Status: DC | PRN
  Administered 2014-10-19 (×2): 1 mg via INTRAVENOUS
  Filled 2014-10-19 (×2): qty 1

## 2014-10-19 MED ORDER — PREDNISONE 5 MG PO TABS
5.0000 mg | ORAL_TABLET | Freq: Every day | ORAL | Status: DC
  Administered 2014-10-19: 5 mg via ORAL
  Filled 2014-10-19 (×2): qty 1

## 2014-10-19 MED ORDER — PANTOPRAZOLE SODIUM 40 MG PO TBEC: 40.0000 mg | DELAYED_RELEASE_TABLET | Freq: Every day | ORAL | Status: DC

## 2014-10-19 MED ORDER — SODIUM CHLORIDE 0.9 % IV BOLUS (SEPSIS)
1000.0000 mL | Freq: Once | INTRAVENOUS | Status: AC
  Administered 2014-10-19: 1000 mL via INTRAVENOUS

## 2014-10-19 MED ORDER — VANCOMYCIN HCL 10 G IV SOLR
1250.0000 mg | INTRAVENOUS | Status: DC
  Administered 2014-10-19 – 2014-10-20 (×2): 1250 mg via INTRAVENOUS
  Filled 2014-10-19 (×3): qty 1250

## 2014-10-19 MED ORDER — OXYCODONE HCL 5 MG PO TABS: 5.0000 mg | ORAL_TABLET | ORAL | Status: DC | PRN

## 2014-10-19 MED ORDER — ASPIRIN EC 81 MG PO TBEC
81.0000 mg | DELAYED_RELEASE_TABLET | Freq: Every day | ORAL | Status: DC
Start: 2014-10-19 — End: 2014-10-19
  Filled 2014-10-19: qty 1

## 2014-10-19 MED ORDER — ALUM & MAG HYDROXIDE-SIMETH 200-200-20 MG/5ML PO SUSP: 30.0000 mL | Freq: Four times a day (QID) | ORAL | Status: DC | PRN

## 2014-10-19 MED ORDER — INSULIN ASPART 100 UNIT/ML IV SOLN
5.0000 [IU] | Freq: Once | INTRAVENOUS | Status: AC
  Administered 2014-10-19: 5 [IU] via INTRAVENOUS
  Filled 2014-10-19: qty 1

## 2014-10-19 NOTE — Progress Notes (Signed)
PHARMACIST - PHYSICIAN ORDER COMMUNICATION  CONCERNING: P&T Medication Policy on Herbal Medications  DESCRIPTION:  This patient's order for:  Cranberry  has been noted.  This product(s) is classified as an "herbal" or natural product. Due to a lack of definitive safety studies or FDA approval, nonstandard manufacturing practices, plus the potential risk of unknown drug-drug interactions while on inpatient medications, the Pharmacy and Therapeutics Committee does not permit the use of "herbal" or natural products of this type within [facility].   ACTION TAKEN: The pharmacy department is unable to verify this order at this time and your patient has been informed of this safety policy. Please reevaluate patient's clinical condition at discharge and address if the herbal or natural product(s) should be resumed at that time.   

## 2014-10-19 NOTE — Progress Notes (Signed)
Patient chocked on scrambled eggs this AM, seemed to do OK with liquids. MD notified.

## 2014-10-19 NOTE — Evaluation (Signed)
Clinical/Bedside Swallow Evaluation Patient Details  Name: Justin [name] MRN: [id number] Date of Birth: 1956-02-28  Today's Date: 10/19/2014 Time: SLP Start Time (ACUTE ONLY): 0248 SLP Stop Time (ACUTE ONLY): 0316 SLP Time Calculation (min) (ACUTE ONLY): 28 min  Past Medical History:  Past Medical History  Diagnosis Date  . Hypertension   . Dyslipidemia   . Carpal tunnel syndrome   . A-V fistula   . Anemia   . Hemorrhoids   . Hyperparathyroidism   . Intracranial hemorrhage   . Hypotension   . Hyperkalemia   . Kidney disease   . Sepsis(995.91)   . GI bleeding   . DVT (deep venous thrombosis)   . PVD (peripheral vascular disease)   . DM (diabetes mellitus)   . Cellulitis   . IV infiltration     Right upper extremity  . PPD positive   . 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 Hypokinesis     EF of 60% with mild anterior hypokinesis, minimal coronary artery disease on catheterization in December of 2007   Past Surgical History:  Past Surgical History  Procedure Laterality Date  . Kidney transplant      History of renal transplant maintained on chronic immunosuppressive therapy  . Av fistula placement      Right arm  . Peripherally inserted central catheter insertion      line placement  . Amputation    . Gastrostomy    . 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 Brain biopsy    . #6 shiley cuff      Less trache   HPI:  Justin [name] is a 59 y.o. 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 Nursing reports chagne in swallow function with patient getting chocked on eggs this am.    Assessment / Plan / Recommendation Clinical  Impression  Pt with moderate oral dysphagia which is exacerbated by behavioral and cognitive deficits. Pt resistive and agitated toward solid PO trials with refusal of multiple puree consistencies. Mechanical soft trialed x1 with holding of bolus in mouth greater than 2 minutes and max cueing from clinlician and RN to extract mech soft bolus from oral cavity. Reports from nursing of choking this am on solid consistencies. Noted timely swallow with thin liquids by cup and straw. Coughing x1 immediately following thin liquid by straw secondary to increased impulsivity in sip size. Recommend clear liquid diet with full supervision from staff at all meals. Recommend meds via alternative means. Continued swallow intervention indicated for diet tolerance and trials of upgraded POs.    Aspiration Risk  Moderate    Diet Recommendation  (clear liquid)   Liquid Administration via: Straw;Cup Medication Administration: Via alternative means Supervision: Full supervision/cueing for compensatory strategies;Staff to assist with self feeding Compensations: Slow rate;Small sips/bites Postural Changes and/or Swallow Maneuvers: Seated upright 90 degrees    Other  Recommendations Oral Care Recommendations: Oral care BID   Follow Up Recommendations       Frequency and Duration min 2x/week  2 weeks   Pertinent Vitals/Pain     SLP Swallow Goals     Swallow  Study Prior Functional Status       General Date of Onset: 10/19/14 HPI: Justin [name] is a 59 y.o. 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 Nursing reports chagne in swallow function with patient getting chocked on eggs this am.  Type of Study: Bedside swallow  evaluation Diet Prior to this Study: Regular;Thin liquids Temperature Spikes Noted: No Respiratory Status: Room air History of Recent Intubation: No Behavior/Cognition: Agitated;Impulsive;Distractible;Doesn't follow directions;Decreased sustained attention Oral Cavity - Dentition: Edentulous Self-Feeding Abilities: Needs assist;Refused PO (can self feed but not safely secondary to impulsivity) Patient Positioning: Upright in bed Baseline Vocal Quality: Clear Volitional Cough: Strong Volitional Swallow: Unable to elicit    Oral/Motor/Sensory Function Overall Oral Motor/Sensory Function: Impaired Labial ROM: Reduced right Labial Symmetry: Abnormal symmetry right Labial Strength: Reduced Lingual Strength: Reduced Facial ROM: Reduced right Facial Strength: Reduced   Ice Chips Ice chips: Not tested   Thin Liquid Thin Liquid: Impaired Presentation: Straw;Cup Pharyngeal  Phase Impairments: Suspected delayed Swallow;Cough - Immediate    Nectar Thick Nectar Thick Liquid: Not tested   Honey Thick Honey Thick Liquid: Not tested   Puree Puree:  (attempted, pt refused )   Solid   GO    Solid: Impaired Oral Phase Impairments: Impaired mastication;Poor awareness of bolus;Reduced lingual movement/coordination;Impaired anterior to posterior transit Oral Phase Functional Implications: Oral holding Pharyngeal Phase Impairments: Suspected delayed Swallow      Arvil Chaco MA, CCC-SLP Acute Care Speech Language Pathologist    Levi Aland 10/19/2014,3:25 PM

## 2014-10-19 NOTE — Progress Notes (Signed)
ANTIBIOTIC CONSULT NOTE - INITIAL  Pharmacy Consult for vancomycin Indication: UTI  Allergies  Allergen Reactions  . Amoxicillin Other (See Comments)    ON MAR  . Ampicillin Other (See Comments)    ON MAR  . Latex Other (See Comments)    ON MAR  . Morphine And Related Other (See Comments)    UNK  . Penicillins Other (See Comments)    ON MAR  . Tape Other (See Comments)    ON MAR    Patient Measurements: Weight: 173 lb 11.6 oz (78.8 kg) Adjusted Body Weight:   Vital Signs: Temp: 97.8 F (36.6 C) (04/17 1647) Temp Source: Oral (04/17 1647) BP: 155/65 mmHg (04/17 1647) Pulse Rate: 77 (04/17 1647) Intake/Output from previous day: 04/16 0701 - 04/17 0700 In: 397.5 [P.O.:240; I.V.:157.5] Out: 0  Intake/Output from this shift:    Labs:  Recent Labs  10/18/14 2030 10/19/14 0505  WBC 6.3 6.0  HGB 16.1 15.0  PLT 169 160  CREATININE 1.93* 1.75*   Estimated Creatinine Clearance: 47.8 mL/min (by C-G formula based on Cr of 1.75). No results for input(s): VANCOTROUGH, VANCOPEAK, VANCORANDOM, GENTTROUGH, GENTPEAK, GENTRANDOM, TOBRATROUGH, TOBRAPEAK, TOBRARND, AMIKACINPEAK, AMIKACINTROU, AMIKACIN in the last 72 hours.   Microbiology: Recent Results (from the past 720 hour(s))  MRSA PCR Screening     Status: Abnormal   Collection Time: 10/19/14  5:20 AM  Result Value Ref Range Status   MRSA by PCR POSITIVE (A) NEGATIVE Final    Comment:        The GeneXpert MRSA Assay (FDA approved for NASAL specimens only), is one component of a comprehensive MRSA colonization surveillance program. It is not intended to diagnose MRSA infection nor to guide or monitor treatment for MRSA infections. RESULT CALLED TO, READ BACK BY AND VERIFIED WITH: REBECCA SPARKS RN AT 4332 10/19/14 BY [facility]     Medical History: Past Medical History  Diagnosis Date  . Hypertension   . Dyslipidemia   . Carpal tunnel syndrome   . A-V fistula   . Anemia   . Hemorrhoids   .  Hyperparathyroidism   . Intracranial hemorrhage   . Hypotension   . Hyperkalemia   . Kidney disease   . Sepsis(995.91)   . GI bleeding   . DVT (deep venous thrombosis)   . PVD (peripheral vascular disease)   . DM (diabetes mellitus)   . Cellulitis   . IV infiltration     Right upper extremity  . PPD positive   . 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 Hypokinesis     EF of 60% with mild anterior hypokinesis, minimal coronary artery disease on catheterization in December of 2007    Medications:  Anti-infectives    Start     Dose/Rate Route Frequency Ordered Stop   10/19/14 2200  cefTRIAXone (ROCEPHIN) 1 g in dextrose 5 % 50 mL IVPB     1 g 100 mL/hr over 30 Minutes Intravenous Every 24 hours 10/19/14 0114     10/19/14 2000  vancomycin (VANCOCIN) 1,250 mg in sodium chloride 0.9 % 250 mL IVPB     1,250 mg 166.7 mL/hr over 90 Minutes Intravenous Every 24 hours 10/19/14 1916     10/18/14 2230  cefTRIAXone (ROCEPHIN) 1 g in dextrose 5 % 50 mL IVPB     1 g 100 mL/hr over 30 Minutes Intravenous  Once 10/18/14 2228 10/19/14 0100     Assessment: 52 yom presented  to the hospital due to poor intake. Adding vancomycin and ceftriaxone for possible UTI. Pt is afebrile and WBC is WNL. Scr is elevated at 1.75.   Vanc 4/17>> CTX 4/17>>  Goal of Therapy:  Vancomycin trough level 10-15 mcg/ml  Plan:  - Vancomycin 1250mg  IV Q24H - F/u renal fxn, C&S, clinical status and trough at [place], Rande Lawman 10/19/2014,7:17 PM

## 2014-10-19 NOTE — Progress Notes (Addendum)
New Admission Note:   Arrival Method: per stretcher from ED with [facility] Mental Orientation: alert, opens eyes but disoriented X4 Telemetry: placed on telebox 15 after CCMD notified Assessment: Completed Skin: warm, dry, flaky on both feet, scabs on bilateral lower extremities, scratch marks and scabs on both arms, edema on right upper extremity, ulceration, redness and tenderness on the penis (cause unknown), amputated left and right little toes. Skin assessed with charge RN Cathie and RN Maudie Mercury. IV: G20 on the left forearm with transparent dressing, clean, dry and intact Pain: denies, no subjective cues of having pain as of this time Tubes: IV line with fluids infusing well Safety Measures: Safety Fall Prevention Plan has been given, but pt is completely disoriented Admission: Completed [address] [place] Orientation: tried to orient pt to the room, unit and staff but pt is completely disoriented Family: no family members at bedside  Orders have been reviewed and implemented. Will continue to monitor the patient. Call light has been placed within reach and bed alarm has been activated.   Georgeanna Harrison BSN, RN [place] [address] [place]

## 2014-10-19 NOTE — Progress Notes (Signed)
Patient's daughters Faith & April at bedside.  Family agreeable with use of restraints.  Family also stated that patient is on puree diet at [facility] and patient pockets his food.  MD notified.

## 2014-10-19 NOTE — Progress Notes (Addendum)
Patient does not want to take any pills or solids during swallow eval, he does take liquids and use straws, for now will consult pharmacy to change meds to alternative route if possible. Hold all oral meds. Intermittent agitation, requiring restrain/sitter for safety. Likely will need psych to assist.  Talked to family, family reported patient has been not communication and became bed to wheelchair bound since intracranial bleed in 2008. Reported h/o period of trach/peg, all removed,  Family reported patient is full code, wish to have feeding tube if patient is determined not same to take oral. (reported had aspiration pna on chopped food, has changed to puree diet and thickened liquid for months.  on aspiration precaution, repeat swallow eval in am. Change meds to alternative route if possible ,discussed with pharmacy. Add vanc due to h/o mrsa.  Time spent>88mins.  From 6:20 pm to 6:50pm.

## 2014-10-19 NOTE — Progress Notes (Signed)
Patient was trying to climb out of bed, pulling at IV tubing, and pulling off telemetry.  Unable to redirect patient.  Tried green mitt on left hand without success.  Patient still continued to pull on lines and get out of bed.  MD notified and restraints order. Waist belt, left wrist restraint, and 4 side rails applied.  Safety sitter requested.    Will continue to monitor.

## 2014-10-19 NOTE — Progress Notes (Signed)
Report called and given by ED RN Raquel Sarna.

## 2014-10-19 NOTE — H&P (Signed)
[facility] Admission History and Physical       ARNIE CLINGENPEEL BTD:[id number] DOB: Feb 02, 1956 DOA: 10/18/2014  Referring physician: EDP PCP: Garwin Brothers, MD  Specialists:   Chief Complaint: Poor Intake of Foods and Liquids  HPI: Justin [name] is a 59 y.o. 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  He was found to have an elevated troponin, and hyperkalemia of 5.7, and an elevated BUN/Cr.   Patient is unable to give his history due to his aphasia.       Review of Systems: Unable to Obtain from the Patient  Past Medical History  Diagnosis Date  . Hypertension   . Dyslipidemia   . Carpal tunnel syndrome   . A-V fistula   . Anemia   . Hemorrhoids   . Hyperparathyroidism   . Intracranial hemorrhage   . Hypotension   . Hyperkalemia   . Kidney disease   . Sepsis(995.91)   . GI bleeding   . DVT (deep venous thrombosis)   . PVD (peripheral vascular disease)   . DM (diabetes mellitus)   . Cellulitis   . IV infiltration     Right upper extremity  . PPD positive   . 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 Hypokinesis     EF of 60% with mild anterior hypokinesis, minimal coronary artery disease on catheterization in December of 2007     Past Surgical History  Procedure Laterality Date  . Kidney transplant      History of renal transplant maintained on chronic immunosuppressive therapy  . Av fistula placement      Right arm  . Peripherally inserted central catheter insertion      line placement  . Amputation    . Gastrostomy    . 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 Brain biopsy    . #6 shiley cuff      Less trache      Prior to Admission medications   Medication Sig Start  Date End Date Taking? Authorizing Provider  allopurinol (ZYLOPRIM) 100 MG tablet Take 100 mg by mouth daily.     Yes Historical Provider, MD  aspirin 81 MG tablet Take 81 mg by mouth daily.     Yes Historical Provider, MD  atorvastatin (LIPITOR) 10 MG tablet Take 10 mg by mouth daily.   Yes Historical Provider, MD  baclofen (LIORESAL) 20 MG tablet Take 20 mg by mouth 3 (three) times daily.   Yes Historical Provider, MD  cefTRIAXone (ROCEPHIN) 1 G injection Inject 1 g into the muscle daily.   Yes Historical Provider, MD  citalopram (CELEXA) 10 MG tablet Take 20 mg by mouth daily.    Yes Historical Provider, MD  Cranberry 425 MG CAPS Take 425 mg by mouth daily.   Yes Historical Provider, MD  Dextrose-Sodium Chloride (DEXTROSE 5 % AND 0.45% NACL) 5-0.45 % Inject 50 mLs into the vein 2 (two) times daily. 3ml/hr 0900 and 1800   Yes Historical Provider, MD  fentaNYL (DURAGESIC - DOSED MCG/HR) 25 MCG/HR patch Place 25 mcg onto the skin every 3 (three) days.   Yes Historical Provider, MD  gabapentin (NEURONTIN) 300 MG capsule Take  300 mg by mouth 3 (three) times daily.  05/20/13  Yes Historical Provider, MD  hydrALAZINE (APRESOLINE) 25 MG tablet Take 25 mg by mouth 2 (two) times daily.   Yes Historical Provider, MD  insulin glargine (LANTUS) 100 unit/mL SOPN Inject 35 Units into the skin at bedtime.   Yes Historical Provider, MD  ipratropium-albuterol (DUONEB) 0.5-2.5 (3) MG/3ML SOLN Take 3 mLs by nebulization every 6 (six) hours as needed (wheezing).   Yes Historical Provider, MD  isosorbide dinitrate (ISORDIL) 10 MG tablet Take 10 mg by mouth 2 (two) times daily.   Yes Historical Provider, MD  levETIRAcetam (KEPPRA) 500 MG tablet Take 500 mg by mouth every 12 (twelve) hours.     Yes Historical Provider, MD  LORazepam (ATIVAN) 0.5 MG tablet Take 0.5 mg by mouth every 8 (eight) hours as needed. Anxiety   Yes Historical Provider, MD  LORazepam (ATIVAN) 2 MG/ML injection Inject 1 mg into the vein every 6  (six) hours as needed for anxiety.   Yes Historical Provider, MD  omeprazole (PRILOSEC) 20 MG capsule Take 20 mg by mouth daily.     Yes Historical Provider, MD  oxyCODONE-acetaminophen (PERCOCET) 7.5-325 MG per tablet Take 1 tablet by mouth 3 (three) times daily.   Yes Historical Provider, MD  Potassium & Sodium Phosphates (PHOS-NAK PO) Take 1 Package by mouth 3 (three) times daily.   Yes Historical Provider, MD  predniSONE (DELTASONE) 5 MG tablet Take 5 mg by mouth daily.    Yes Historical Provider, MD  senna-docusate (SENOKOT-S) 8.6-50 MG per tablet Take 1 tablet by mouth daily.   Yes Historical Provider, MD  tacrolimus (PROGRAF) 0.5 MG capsule Take 0.5 mg by mouth 2 (two) times daily.   Yes Historical Provider, MD  tacrolimus (PROGRAF) 1 MG capsule Take 3 mg by mouth 2 (two) times daily.   Yes Historical Provider, MD  tiZANidine (ZANAFLEX) 2 MG tablet Take 2 mg by mouth 2 (two) times daily.  05/26/13  Yes Historical Provider, MD  Vitamin D, Ergocalciferol, (DRISDOL) 50000 UNITS CAPS Take 50,000 Units by mouth every 30 (thirty) days.   Yes Historical Provider, MD  albuterol (PROVENTIL) (2.5 MG/3ML) 0.083% nebulizer solution  05/08/13   Historical Provider, MD  amLODipine (NORVASC) 10 MG tablet Take 10 mg by mouth daily.      Historical Provider, MD  HYDROcodone-acetaminophen (VICODIN) 5-500 MG per tablet Take 1 tablet by mouth every 6 (six) hours as needed.      Historical Provider, MD  insulin aspart (NOVOLOG) 100 UNIT/ML injection Inject 9 Units into the skin 3 (three) times daily before meals.     Historical Provider, MD  insulin glargine (LANTUS) 100 UNIT/ML injection Inject 31 Units into the skin at bedtime.     Historical Provider, MD  oxyCODONE-acetaminophen (PERCOCET/ROXICET) 5-325 MG per tablet Take 1 tablet by mouth 2 (two) times daily as needed for moderate pain.  05/01/13   Historical Provider, MD  polyethylene glycol (MIRALAX / GLYCOLAX) packet Take 17 g by mouth as directed.       Historical Provider, MD     Allergies  Allergen Reactions  . Amoxicillin Other (See Comments)    ON MAR  . Ampicillin Other (See Comments)    ON MAR  . Latex Other (See Comments)    ON MAR  . Morphine And Related Other (See Comments)    UNK  . Penicillins Other (See Comments)    ON MAR  . Tape Other (See Comments)  ON MAR    Social History:  reports that he quit smoking about 13 years ago. He does not have any smokeless tobacco history on file. He reports that he uses illicit drugs. He reports that he does not drink alcohol.    Family History  Problem Relation Age of Onset  . Heart disease Mother        Physical Exam:  GEN:  Pleasant Thin  59 y.o. African American  male examined and in no acute distress; cooperative with exam Filed Vitals:   10/19/14 0000 10/19/14 0030 10/19/14 0100 10/19/14 0113  BP: 189/94   186/73  Pulse: 93 93 91 97  Temp:      TempSrc:      Resp: 18 19 18 16   SpO2: 99% 97% 96% 95%   Blood pressure 186/73, pulse 97, temperature 98.7 F (37.1 C), temperature source Rectal, resp. rate 16, SpO2 95 %. PSYCH: He is alert and oriented  does not appear anxious does not appear depressed; affect is normal HEENT: Normocephalic and Atraumatic, Mucous membranes pink; PERRLA; EOM intact; Fundi:  Benign;  No scleral icterus, Nares: Patent, Oropharynx: Clear, Edentulous,    Neck:  FROM, No Cervical Lymphadenopathy nor Thyromegaly or Carotid Bruit; No JVD; Breasts:: Not examined CHEST WALL: No tenderness CHEST: Normal respiration, clear to auscultation bilaterally HEART: Regular rate and rhythm; no murmurs rubs or gallops BACK: No kyphosis or scoliosis; No CVA tenderness ABDOMEN: Positive Bowel Sounds, Scaphoid, Soft Non-Tender, No Rebound or Guarding; No Masses, No Organomegaly. Rectal Exam: Not done EXTREMITIES: No Cyanosis, Clubbing, or Edema; No Ulcerations. Genitalia: not examined PULSES: 2+ and symmetric SKIN: Poor skin Turgor, Numerous Scars ot  BLEs    CNS:  Alert and Oriented , + Expressive Aphasia, and Right Hemiplegia, Bedbound, Vascular: pulses palpable throughout    Labs on Admission:  Basic Metabolic Panel:  Recent Labs Lab 10/18/14 2030  NA 144  K 5.7*  CL 107  CO2 24  GLUCOSE 137*  BUN 36*  CREATININE 1.93*  CALCIUM 10.7*   Liver Function Tests:  Recent Labs Lab 10/18/14 2030  AST 48*  ALT 20  ALKPHOS 84  BILITOT 1.6*  PROT 8.0  ALBUMIN 2.9*   No results for input(s): LIPASE, AMYLASE in the last 168 hours. No results for input(s): AMMONIA in the last 168 hours. CBC:  Recent Labs Lab 10/18/14 2030  WBC 6.3  NEUTROABS 4.6  HGB 16.1  HCT 51.9  MCV 89.3  PLT 169   Cardiac Enzymes:  Recent Labs Lab 10/18/14 2134  TROPONINI 0.11*    BNP (last 3 results) No results for input(s): BNP in the last 8760 hours.  ProBNP (last 3 results) No results for input(s): PROBNP in the last 8760 hours.  CBG:  Recent Labs Lab 10/18/14 2121  GLUCAP 129*    Radiological Exams on Admission: Dg Chest 2 View  10/18/2014   CLINICAL DATA:  Patient with failure to thrive. History of hypertension and diabetes.  EXAM: CHEST  2 VIEW  COMPARISON:  Chest radiograph 06/15/2013  FINDINGS: Stable cardiac and mediastinal contours. Low lung volumes. Minimal heterogeneous opacities left lung base. No pleural effusion or pneumothorax. Regional skeleton is unremarkable.  IMPRESSION: Minimal left basilar heterogeneous opacities favored to represent atelectasis.  Low lung volumes.   Electronically Signed   By: Lovey Newcomer M.D.   On: 10/18/2014 22:07     EKG: Independently reviewed.    Assessment/Plan:   59 y.o. male with  Active Problems:   1.  Hyperkalemia   Telemetry Monitoring   Kayexalate 30 grams PO x 1   IVFs     2.   AKI (acute kidney injury)   IVFs   Monitor BUN/Cr     3.   Elevated troponin   Telemetry Monitoring   Cycle Troponins   Nitropaste, O2, ASA    Cards Consulted by EDP to see        4.   Failure to thrive in adult   Gentle IVFs     5.   Diabetes mellitus   Lantus 15 units SQ qhs, ( Reduced Dose due to Poor PO Intake)   SSI coverage PRN     6.   Hyperlipidemia   Continue Atorvastatin Rx     7.   Essential hypertension   Continue Amlodipine, Hydralazine   PRN IV Hydralazine for SBP > 160     8.   Intracranial hemorrhage- with residual deficits of Chronic Aphasia, and Right Sided Hemiplegia     9.   Renal transplant, status post   Continue Tactolimus and Prednisone Rx    10.  DVT Prophylaxis   SCQ Heparin          Code Status:     FULL CODE      Family Communication:   No Family Present    Disposition Plan:    Inpatient  Observation Status        Time spent:  81 Minutes      Theressa Millard [facility] Pager [phone]   If [place] Please Contact the Day Rounding Team MD for [facility]  If 7PM-7AM, Please Contact Night-Floor Coverage  [email or web address] Password [email or web address] 10/19/2014, 1:19 AM     ADDENDUM:   Patient was seen and examined on 10/19/2014

## 2014-10-19 NOTE — Progress Notes (Signed)
MEDICATION RELATED CONSULT NOTE - INITIAL   Pharmacy Consult for meds to alternative forms Indication:   Allergies  Allergen Reactions  . Amoxicillin Other (See Comments)    ON MAR  . Ampicillin Other (See Comments)    ON MAR  . Latex Other (See Comments)    ON MAR  . Morphine And Related Other (See Comments)    UNK  . Penicillins Other (See Comments)    ON MAR  . Tape Other (See Comments)    ON MAR    Patient Measurements: Weight: 173 lb 11.6 oz (78.8 kg) Adjusted Body Weight:   Vital Signs: Temp: 97.8 F (36.6 C) (04/17 1647) Temp Source: Oral (04/17 1647) BP: 155/65 mmHg (04/17 1647) Pulse Rate: 77 (04/17 1647) Intake/Output from previous day: 04/16 0701 - 04/17 0700 In: 397.5 [P.O.:240; I.V.:157.5] Out: 0  Intake/Output from this shift: Total I/O In: 200 [I.V.:200] Out: -   Labs:  Recent Labs  10/18/14 2030 10/19/14 0505  WBC 6.3 6.0  HGB 16.1 15.0  HCT 51.9 50.5  PLT 169 160  CREATININE 1.93* 1.75*  ALBUMIN 2.9*  --   PROT 8.0  --   AST 48*  --   ALT 20  --   ALKPHOS 84  --   BILITOT 1.6*  --    Estimated Creatinine Clearance: 47.8 mL/min (by C-G formula based on Cr of 1.75).   Microbiology: Recent Results (from the past 720 hour(s))  MRSA PCR Screening     Status: Abnormal   Collection Time: 10/19/14  5:20 AM  Result Value Ref Range Status   MRSA by PCR POSITIVE (A) NEGATIVE Final    Comment:        The GeneXpert MRSA Assay (FDA approved for NASAL specimens only), is one component of a comprehensive MRSA colonization surveillance program. It is not intended to diagnose MRSA infection nor to guide or monitor treatment for MRSA infections. RESULT CALLED TO, READ BACK BY AND VERIFIED WITH: REBECCA SPARKS RN AT 5329 10/19/14 BY [facility]     Medical History: Past Medical History  Diagnosis Date  . Hypertension   . Dyslipidemia   . Carpal tunnel syndrome   . A-V fistula   . Anemia   . Hemorrhoids   . Hyperparathyroidism   .  Intracranial hemorrhage   . Hypotension   . Hyperkalemia   . Kidney disease   . Sepsis(995.91)   . GI bleeding   . DVT (deep venous thrombosis)   . PVD (peripheral vascular disease)   . DM (diabetes mellitus)   . Cellulitis   . IV infiltration     Right upper extremity  . PPD positive   . 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 Hypokinesis     EF of 60% with mild anterior hypokinesis, minimal coronary artery disease on catheterization in December of 2007    Medications:  Prescriptions prior to admission  Medication Sig Dispense Refill Last Dose  . allopurinol (ZYLOPRIM) 100 MG tablet Take 100 mg by mouth daily.     10/18/2014 at Unknown time  . aspirin 81 MG tablet Take 81 mg by mouth daily.     10/18/2014 at Unknown time  . atorvastatin (LIPITOR) 10 MG tablet Take 10 mg by mouth daily.   10/17/2014 at Unknown time  . baclofen (LIORESAL) 20 MG tablet Take 20 mg by mouth 3 (three) times daily.   10/18/2014 at Unknown time  . cefTRIAXone (ROCEPHIN)  1 G injection Inject 1 g into the muscle daily.   10/18/2014 at Unknown time  . citalopram (CELEXA) 10 MG tablet Take 20 mg by mouth daily.    10/18/2014 at Unknown time  . Cranberry 425 MG CAPS Take 425 mg by mouth daily.   10/18/2014 at Unknown time  . Dextrose-Sodium Chloride (DEXTROSE 5 % AND 0.45% NACL) 5-0.45 % Inject 50 mLs into the vein 2 (two) times daily. 31ml/hr 0900 and 1800   10/18/2014 at Unknown time  . fentaNYL (DURAGESIC - DOSED MCG/HR) 25 MCG/HR patch Place 25 mcg onto the skin every 3 (three) days.   10/17/2014  . gabapentin (NEURONTIN) 300 MG capsule Take 300 mg by mouth 3 (three) times daily.    10/18/2014 at Unknown time  . hydrALAZINE (APRESOLINE) 25 MG tablet Take 25 mg by mouth 2 (two) times daily.   10/18/2014 at am  . insulin glargine (LANTUS) 100 unit/mL SOPN Inject 35 Units into the skin at bedtime.   10/17/2014 at Unknown time  . ipratropium-albuterol (DUONEB) 0.5-2.5 (3) MG/3ML  SOLN Take 3 mLs by nebulization every 6 (six) hours as needed (wheezing).   unk  . isosorbide dinitrate (ISORDIL) 10 MG tablet Take 10 mg by mouth 2 (two) times daily.   10/18/2014 at Unknown time  . levETIRAcetam (KEPPRA) 500 MG tablet Take 500 mg by mouth every 12 (twelve) hours.     10/18/2014 at am  . LORazepam (ATIVAN) 0.5 MG tablet Take 0.5 mg by mouth every 8 (eight) hours as needed. Anxiety   10/12/2014  . LORazepam (ATIVAN) 2 MG/ML injection Inject 1 mg into the vein every 6 (six) hours as needed for anxiety.   unk  . omeprazole (PRILOSEC) 20 MG capsule Take 20 mg by mouth daily.     10/18/2014 at Unknown time  . oxyCODONE-acetaminophen (PERCOCET) 7.5-325 MG per tablet Take 1 tablet by mouth 3 (three) times daily.   10/18/2014 at Unknown time  . Potassium & Sodium Phosphates (PHOS-NAK PO) Take 1 Package by mouth 3 (three) times daily.   10/18/2014 at Unknown time  . predniSONE (DELTASONE) 5 MG tablet Take 5 mg by mouth daily.    10/18/2014 at Unknown time  . senna-docusate (SENOKOT-S) 8.6-50 MG per tablet Take 1 tablet by mouth daily.   10/18/2014 at Unknown time  . tacrolimus (PROGRAF) 0.5 MG capsule Take 0.5 mg by mouth 2 (two) times daily.   10/18/2014 at am  . tacrolimus (PROGRAF) 1 MG capsule Take 3 mg by mouth 2 (two) times daily.   10/18/2014 at am  . tiZANidine (ZANAFLEX) 2 MG tablet Take 2 mg by mouth 2 (two) times daily.    10/18/2014 at am  . Vitamin D, Ergocalciferol, (DRISDOL) 50000 UNITS CAPS Take 50,000 Units by mouth every 30 (thirty) days.   unk  . albuterol (PROVENTIL) (2.5 MG/3ML) 0.083% nebulizer solution    Taking  . amLODipine (NORVASC) 10 MG tablet Take 10 mg by mouth daily.     Taking  . HYDROcodone-acetaminophen (VICODIN) 5-500 MG per tablet Take 1 tablet by mouth every 6 (six) hours as needed.     Taking  . insulin aspart (NOVOLOG) 100 UNIT/ML injection Inject 9 Units into the skin 3 (three) times daily before meals.    Taking  . insulin glargine (LANTUS) 100 UNIT/ML  injection Inject 31 Units into the skin at bedtime.    Taking  . oxyCODONE-acetaminophen (PERCOCET/ROXICET) 5-325 MG per tablet Take 1 tablet by mouth 2 (two) times daily  as needed for moderate pain.    Taking  . polyethylene glycol (MIRALAX / GLYCOLAX) packet Take 17 g by mouth as directed.     Taking   Scheduled:  . cefTRIAXone (ROCEPHIN)  IV  1 g Intravenous Q24H  . [START ON 10/20/2014] fentaNYL  25 mcg Transdermal Q72H  . heparin  5,000 Units Subcutaneous 3 times per day  . insulin glargine  15 Units Subcutaneous QHS  . levETIRAcetam  500 mg Intravenous Q12H  . pantoprazole (PROTONIX) IV  40 mg Intravenous Q24H  . predniSONE  5 mg Oral Q breakfast  . sodium chloride  3 mL Intravenous Q12H  . tacrolimus  3.5 mg Oral BID    Assessment: 59 yo who was admitted for alternative forms of his meds. He doesn't want to take any pills but would take suspension version of it. These are the meds we can use suspension if we need to here:  Protonix Neurontin Prednisone Keppra Baclofen Hydralazine (IV) Tacrolimus  After talking to Dr Erlinda Hong, we are just going to focus on the immunosuppressive meds now (prednisone, tacrolimus)  Plan:   Restart prednisone 5mg  suspension qday Restart tacrolimus 3.5mg  suspension bid  Onnie Boer, PharmD Pager: [phone] 10/19/2014 5:07 PM

## 2014-10-20 DIAGNOSIS — F05 Delirium due to known physiological condition: Secondary | ICD-10-CM

## 2014-10-20 LAB — URINE CULTURE
COLONY COUNT: NO GROWTH
Culture: NO GROWTH

## 2014-10-20 LAB — CBC
HCT: 51.5 % (ref 39.0–52.0)
Hemoglobin: 15.3 g/dL (ref 13.0–17.0)
MCH: 26.8 pg (ref 26.0–34.0)
MCHC: 29.7 g/dL — ABNORMAL LOW (ref 30.0–36.0)
MCV: 90.2 fL (ref 78.0–100.0)
PLATELETS: 154 10*3/uL (ref 150–400)
RBC: 5.71 MIL/uL (ref 4.22–5.81)
RDW: 14.2 % (ref 11.5–15.5)
WBC: 5.4 10*3/uL (ref 4.0–10.5)

## 2014-10-20 LAB — COMPREHENSIVE METABOLIC PANEL
ALBUMIN: 2.5 g/dL — AB (ref 3.5–5.2)
ALK PHOS: 75 U/L (ref 39–117)
ALT: 16 U/L (ref 0–53)
AST: 32 U/L (ref 0–37)
Anion gap: 10 (ref 5–15)
BUN: 36 mg/dL — ABNORMAL HIGH (ref 6–23)
CALCIUM: 10 mg/dL (ref 8.4–10.5)
CO2: 23 mmol/L (ref 19–32)
Chloride: 109 mmol/L (ref 96–112)
Creatinine, Ser: 1.71 mg/dL — ABNORMAL HIGH (ref 0.50–1.35)
GFR calc Af Amer: 48 mL/min — ABNORMAL LOW (ref >90)
GFR calc non Af Amer: 42 mL/min — ABNORMAL LOW (ref >90)
Glucose, Bld: 224 mg/dL — ABNORMAL HIGH (ref 70–99)
POTASSIUM: 4.4 mmol/L (ref 3.5–5.1)
SODIUM: 142 mmol/L (ref 135–145)
TOTAL PROTEIN: 7 g/dL (ref 6.0–8.3)
Total Bilirubin: 1 mg/dL (ref 0.3–1.2)

## 2014-10-20 LAB — TROPONIN I: Troponin I: 0.08 ng/mL — ABNORMAL HIGH (ref <0.031)

## 2014-10-20 LAB — GLUCOSE, CAPILLARY
Glucose-Capillary: 188 mg/dL — ABNORMAL HIGH (ref 70–99)
Glucose-Capillary: 166 mg/dL — ABNORMAL HIGH (ref 70–99)
Glucose-Capillary: 220 mg/dL — ABNORMAL HIGH (ref 70–99)
Glucose-Capillary: 169 mg/dL — ABNORMAL HIGH (ref 70–99)

## 2014-10-20 LAB — LACTIC ACID, PLASMA: LACTIC ACID, VENOUS: 1.1 mmol/L (ref 0.5–2.0)

## 2014-10-20 LAB — TSH: TSH: 0.608 u[IU]/mL (ref 0.350–4.500)

## 2014-10-20 MED ORDER — BOOST / RESOURCE BREEZE PO LIQD
1.0000 | Freq: Three times a day (TID) | ORAL | Status: DC
  Administered 2014-10-20 – 2014-10-21 (×3): 1 via ORAL

## 2014-10-20 MED ORDER — RESOURCE THICKENUP CLEAR PO POWD
ORAL | Status: DC | PRN
  Filled 2014-10-20: qty 125

## 2014-10-20 NOTE — Progress Notes (Addendum)
Attempted to give patient scheduled bedtime medications. Patient continues to verbalize "I'm fine" and locked his arm inside his blanket. Would not remove his arm out and remove his blankets to administer any injections or PO meds.  While patient was provided care by NT. Patient allowed lantus injection. Attempted to administer other scheduled meds patient continues to verbalize "I'm fine" and placed the blanket over his face. Will continue to monitor.

## 2014-10-20 NOTE — Progress Notes (Signed)
PROGRESS NOTE  Justin [name] WER:[id number] DOB: [birth year] DOA: 10/18/2014 PCP: Garwin Brothers, MD  HPI/Recap of past 24 hours:  Aphasia, not following commend, but awake, alert, no agitation during exam, overall improved. Repeat swallow eval pending, keep on vanc for now for possible pna? Skin?urine culture negative. Patient not able to cooperate with echocardiogram.   Assessment/Plan: Active Problems:   Diabetes mellitus   Hyperlipidemia   Essential hypertension   Intracranial hemorrhage   Hyperkalemia   AKI (acute kidney injury)   Elevated troponin   Renal transplant, status post   Failure to thrive in adult   UTI (lower urinary tract infection)   Acute confusional state  1. Hyperkalemia Telemetry Monitoring Kayexalate 30 grams PO x 1 IVFs                         resolved    2. AKI (acute kidney injury) IVFs improving, urine culture negative    3. Elevated troponin , mild, flat in the setting of ARF Telemetry Monitoring, EKG does has st flattening inferolateral leads. patient refused echo    4. Failure to thrive in adult Gentle IVFs, more alert, speech recommended puree diet with thickened liquid, aspiration precuation.                         Family reported patient has been pocketing food prior to admission, suspect progression of baseline confusion, discussed with family about possible peg placement in the future if patient not able to take consistent oral intake, family wished to proceed with peg placement if needed.    cxr ? pna with h/o mrsa , continue on vanc/rocephin for now.    5. Diabetes mellitus Lantus 15 units SQ qhs, ( Reduced Dose due to Poor PO Intake) SSI coverage PRN                    A1c pending    6. Hyperlipidemia Continue Atorvastatin Rx    7. Essential hypertension Continue Amlodipine, Hydralazine PRN IV Hydralazine for SBP > 160    8. 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 Renal transplant, status post Continue Tactolimus and Prednisone Rx                         Tac level pending   10. DVT Prophylaxis SCQ Heparin       Code Status: FULL CODE  Family Communication: No Family Present  Disposition Plan: Inpatient Observation Status   Code Status: full  Family Communication: daughters  Disposition Plan: d/c 4/19 if continue to improve   Consultants:  none  Procedures:  none  Antibiotics:  vanc/rocephin   Objective: BP 181/89 mmHg  Pulse 76  Temp(Src) 98.2 F (36.8 C) (Oral)  Resp 19  Wt 78.8 kg (173 lb 11.6 oz)  SpO2 98%  Intake/Output Summary (Last 24 hours) at 10/20/14 1008 Last data filed at 10/20/14 0906  Gross per 24 hour  Intake   1965 ml  Output      0 ml  Net   1965 ml   Filed Weights   10/19/14 0147  Weight: 78.8 kg (173 lb 11.6 oz)    Exam:   General:  NAD  Cardiovascular: RRR  Respiratory: CTABL  Abdomen: Soft/ND/NT, positive  BS  Musculoskeletal: No Edema  Neuro:   Data Reviewed: Basic Metabolic Panel:  Recent Labs Lab 10/18/14 2030 10/19/14 0505 10/20/14 0455  NA 144 144 142  K 5.7* 4.1 4.4  CL 107 107 109  CO2 24 27 23   GLUCOSE 137* 135* 224*  BUN 36* 33* 36*  CREATININE 1.93* 1.75* 1.71*  CALCIUM 10.7* 10.0 10.0   Liver Function Tests:  Recent Labs Lab 10/18/14 2030 10/20/14 0455  AST 48* 32  ALT 20 16  ALKPHOS 84 75  BILITOT 1.6* 1.0  PROT 8.0  7.0  ALBUMIN 2.9* 2.5*   No results for input(s): LIPASE, AMYLASE in the last 168 hours. No results for input(s): AMMONIA in the last 168 hours. CBC:  Recent Labs Lab 10/18/14 2030 10/19/14 0505 10/20/14 0455  WBC 6.3 6.0 5.4  NEUTROABS 4.6  --   --   HGB 16.1 15.0 15.3  HCT 51.9 50.5 51.5  MCV 89.3 90.3 90.2  PLT 169 160 154   Cardiac Enzymes:    Recent Labs Lab 10/18/14 2134 10/20/14 0455  TROPONINI 0.11* 0.08*   BNP (last 3 results) No results for input(s): BNP in the last 8760 hours.  ProBNP (last 3 results) No results for input(s): PROBNP in the last 8760 hours.  CBG:  Recent Labs Lab 10/19/14 0733 10/19/14 1101 10/19/14 1605 10/19/14 2010 10/20/14 0752  GLUCAP 120* 146* 157* 163* 188*    Recent Results (from the past 240 hour(s))  Urine culture     Status: None   Collection Time: 10/18/14  9:35 PM  Result Value Ref Range Status   Specimen Description URINE, RANDOM  Final   Special Requests Immunocompromised  Final   Colony Count NO GROWTH Performed at [facility]   Final   Culture NO GROWTH Performed at [facility]   Final   Report Status 10/20/2014 FINAL  Final  MRSA PCR Screening     Status: Abnormal   Collection Time: 10/19/14  5:20 AM  Result Value Ref Range Status   MRSA by PCR POSITIVE (A) NEGATIVE Final    Comment:        The GeneXpert MRSA Assay (FDA approved for NASAL specimens only), is one component of a comprehensive MRSA colonization surveillance program. It is not intended to diagnose MRSA infection nor to guide or monitor treatment for MRSA infections. RESULT CALLED TO, READ BACK BY AND VERIFIED WITH: Cameron RN AT 0911 10/19/14 BY Karie Chimera      Studies: No results found.  Scheduled Meds: . cefTRIAXone (ROCEPHIN)  IV  1 g Intravenous Q24H  . Chlorhexidine Gluconate Cloth  6 each Topical Q0600  . fentaNYL  25 mcg Transdermal Q72H  . heparin  5,000 Units Subcutaneous 3 times per day  .  insulin glargine  15 Units Subcutaneous QHS  . levETIRAcetam  500 mg Intravenous Q12H  . mupirocin ointment  1 application Nasal BID  . pantoprazole (PROTONIX) IV  40 mg Intravenous Q24H  . predniSONE  5 mg Oral Q breakfast  . sodium chloride  3 mL Intravenous Q12H  . tacrolimus  3.5 mg Oral BID  . vancomycin  1,250 mg Intravenous Q24H    Continuous Infusions: . sodium chloride 50 mL/hr (10/19/14 0251)     Time spent: 68mins  Babatunde Seago MD, PhD  [facility] Pager [phone]. If 7PM-7AM, please contact night-coverage at [email or web address], password [facility] 10/20/2014, 10:08 AM  LOS: 1 day

## 2014-10-20 NOTE — Progress Notes (Signed)
  Echocardiogram 2D Echocardiogram  Was refused by the patient. He would not turn from his right side or allow staff to turn him   Donata Clay 10/20/2014, 3:05 PM

## 2014-10-20 NOTE — Progress Notes (Signed)
Speech Language Pathology Treatment: Dysphagia  Patient Details Name: Justin [name] MRN: [id number] DOB: 09-28-1955 Today's Date: 10/20/2014 Time: 4158-3094 SLP Time Calculation (min) (ACUTE ONLY): 18 min  Assessment / Plan / Recommendation Clinical Impression  Pt much more cooperative today, responsive to visual and contextual cues for feeding. Pt readily accepted pudding texture today with prolonged oral manipulation, but no significant pocketing. Pt is impulsive and takes thin liquids too quickly and chokes unless being given max assist to limit bolus size. Pts daughter reports that since he was switched to nectar he is not being offered as many liquids and may be dehydrated, but she also reports he had a diagnosis of pna recently and he is not given much supervision during Po intake. Given this report, recommend pt resume puree (dys 1) diet with nectar thick liquids and complete an MBS tomorrow to objectively determine if thickened liquids are needed.    HPI HPI: Justin [name] is a 59 y.o. 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 Nursing reports chagne in swallow function with patient getting chocked on eggs this am.    Pertinent Vitals    SLP Plan  Continue with current plan of care;MBS    Recommendations Diet recommendations: Dysphagia 1 (puree);Nectar-thick liquid Liquids provided via: Cup;Straw Medication Administration: Whole meds with puree Supervision: Full supervision/cueing for compensatory strategies;Staff to assist with self feeding Compensations: Slow rate;Small sips/bites Postural Changes and/or Swallow Maneuvers: Seated upright 90 degrees              Oral Care Recommendations: Oral  care BID Plan: Continue with current plan of care;MBS    GO    Justin Baltimore, MA CCC-SLP [phone]  Justin [name] 10/20/2014, 2:32 PM

## 2014-10-20 NOTE — Progress Notes (Signed)
INITIAL NUTRITION ASSESSMENT  DOCUMENTATION CODES Per approved criteria  -Not Applicable   INTERVENTION: Provide Resource Breeze po TID, each supplement provides 250 kcal and 9 grams of protein.  Encourage adequate PO intake.  NUTRITION DIAGNOSIS: Increased nutrient needs related to current clear liquid diet not providing full needs as evidenced by estimated nutrition needs.   Goal: Pt to meet >/= 90% of their estimated nutrition needs   Monitor:  PO intake, weight trends, labs, I/O's  Reason for Assessment: MD consult for assessment of nutrition requirements/status  59 y.o. 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  Pt is currently on a clear liquid diet. Meal completion has been 100%. Pt is aphasic. No family at bedside. Unable to obtain nutrition hx. Pt is mostly bed to wheelchair bound. Per Epic weight records, pt with a 19.5% weight loss in 4 months. RD to order Resource Breeze to aid in caloric and protein needs.   Nutrition Focused Physical Exam:  Subcutaneous Fat:  Orbital Region: N/A Upper Arm Region: WNL Thoracic and Lumbar Region: WNL  Muscle:  Temple Region: N/A Clavicle Bone Region: Mild to moderate depletion Clavicle and Acromion Bone Region: Mild depletion Scapular Bone Region: N/A Dorsal Hand: N/A Patellar Region: WNL Anterior Thigh Region: WNL Posterior Calf Region: Moderate depletion  Edema: +1 RUE   Labs: Low GFR. High BUN and creatinine.  Height: Ht Readings from Last 1 Encounters:  06/16/14 5\' 11"  (1.803 m)    Weight: Wt Readings from Last 1 Encounters:  10/19/14 173 lb 11.6 oz (78.8 kg)    Ideal Body Weight: 172 lbs  % Ideal Body Weight: 101%  Wt Readings from  Last 10 Encounters:  10/19/14 173 lb 11.6 oz (78.8 kg)  06/16/14 215 lb (97.523 kg)    Usual Body Weight: Unable to obtain  % Usual Body Weight: ---  BMI:  Body mass index is 24.24 kg/(m^2).  Estimated Nutritional Needs: Kcal: 1950-2150 Protein: 95-110 grams Fluid: 1.9 - 2.1 L/day  Skin: Stage II pressure ulcer on sacrum, +1 RUE edema  Diet Order: Diet clear liquid Room service appropriate?: Yes; Fluid consistency:: Thin  EDUCATION NEEDS: -Education not appropriate at this time   Intake/Output Summary (Last 24 hours) at 10/20/14 1025 Last data filed at 10/20/14 0906  Gross per 24 hour  Intake   1965 ml  Output      0 ml  Net   1965 ml    Last BM: PTA  Labs:   Recent Labs Lab 10/18/14 2030 10/19/14 0505 10/20/14 0455  NA 144 144 142  K 5.7* 4.1 4.4  CL 107 107 109  CO2 24 27 23   BUN 36* 33* 36*  CREATININE 1.93* 1.75* 1.71*  CALCIUM 10.7* 10.0 10.0  GLUCOSE 137* 135* 224*    CBG (last 3)   Recent Labs  10/19/14 1605 10/19/14 2010 10/20/14 0752  GLUCAP 157* 163* 188*    Scheduled Meds: . cefTRIAXone (ROCEPHIN)  IV  1 g Intravenous Q24H  . Chlorhexidine Gluconate Cloth  6 each Topical Q0600  . fentaNYL  25 mcg Transdermal Q72H  . heparin  5,000 Units Subcutaneous 3 times per day  . insulin glargine  15 Units Subcutaneous QHS  . levETIRAcetam  500 mg Intravenous Q12H  . mupirocin ointment  1 application Nasal BID  . pantoprazole (PROTONIX) IV  40 mg Intravenous Q24H  . predniSONE  5 mg Oral Q breakfast  . sodium chloride  3 mL Intravenous Q12H  . tacrolimus  3.5 mg Oral BID  . vancomycin  1,250 mg Intravenous Q24H    Continuous Infusions: . sodium chloride 50 mL/hr (10/19/14 0251)    Past Medical History  Diagnosis Date  . Hypertension   . Dyslipidemia   . Carpal tunnel syndrome   . A-V fistula   . Anemia   . Hemorrhoids   . Hyperparathyroidism   . Intracranial hemorrhage   . Hypotension   . Hyperkalemia   . Kidney disease   .  Sepsis(995.91)   . GI bleeding   . DVT (deep venous thrombosis)   . PVD (peripheral vascular disease)   . DM (diabetes mellitus)   . Cellulitis   . IV infiltration     Right upper extremity  . PPD positive   . 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 Hypokinesis     EF of 60% with mild anterior hypokinesis, minimal coronary artery disease on catheterization in December of 2007    Past Surgical History  Procedure Laterality Date  . Kidney transplant      History of renal transplant maintained on chronic immunosuppressive therapy  . Av fistula placement      Right arm  . Peripherally inserted central catheter insertion      line placement  . Amputation    . Gastrostomy    . 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 Brain biopsy    . #6 shiley cuff      Less trache    Kallie Locks, MS, RD, LDN Pager # [phone] After hours/ weekend pager # [phone]

## 2014-10-20 NOTE — Progress Notes (Signed)
Fentanyl patch removed and new one applied per MD order.

## 2014-10-21 ENCOUNTER — Inpatient Hospital Stay (HOSPITAL_COMMUNITY): Payer: Medicare Other

## 2014-10-21 DIAGNOSIS — E875 Hyperkalemia: Secondary | ICD-10-CM

## 2014-10-21 LAB — BASIC METABOLIC PANEL
Anion gap: 7 (ref 5–15)
BUN: 24 mg/dL — AB (ref 6–23)
CO2: 27 mmol/L (ref 19–32)
CREATININE: 1.44 mg/dL — AB (ref 0.50–1.35)
Calcium: 9.8 mg/dL (ref 8.4–10.5)
Chloride: 107 mmol/L (ref 96–112)
GFR calc Af Amer: 60 mL/min — ABNORMAL LOW (ref >90)
GFR calc non Af Amer: 51 mL/min — ABNORMAL LOW (ref >90)
Glucose, Bld: 87 mg/dL (ref 70–99)
Potassium: 4 mmol/L (ref 3.5–5.1)
Sodium: 141 mmol/L (ref 135–145)

## 2014-10-21 LAB — GLUCOSE, CAPILLARY
Glucose-Capillary: 73 mg/dL (ref 70–99)
Glucose-Capillary: 141 mg/dL — ABNORMAL HIGH (ref 70–99)

## 2014-10-21 LAB — TACROLIMUS LEVEL: TACROLIMUS (FK506) - LABCORP: 6.9 ng/mL (ref 2.0–20.0)

## 2014-10-21 LAB — HEMOGLOBIN A1C
Hgb A1c MFr Bld: 7.9 % — ABNORMAL HIGH (ref 4.8–5.6)
Mean Plasma Glucose: 180 mg/dL

## 2014-10-21 MED ORDER — INSULIN GLARGINE 100 UNIT/ML ~~LOC~~ SOLN: 10.0000 [IU] | Freq: Every day | SUBCUTANEOUS | Status: DC

## 2014-10-21 MED ORDER — DOXYCYCLINE HYCLATE 100 MG PO CAPS: 100.0000 mg | ORAL_CAPSULE | Freq: Two times a day (BID) | ORAL | Status: DC

## 2014-10-21 MED ORDER — VANCOMYCIN HCL 10 G IV SOLR
1500.0000 mg | INTRAVENOUS | Status: DC
  Filled 2014-10-21: qty 1500

## 2014-10-21 MED ORDER — MUPIROCIN 2 % EX OINT: 1.0000 "application " | TOPICAL_OINTMENT | Freq: Two times a day (BID) | CUTANEOUS | Status: DC

## 2014-10-21 MED ORDER — CHLORHEXIDINE GLUCONATE CLOTH 2 % EX PADS: 6.0000 | MEDICATED_PAD | Freq: Every day | CUTANEOUS | Status: DC

## 2014-10-21 NOTE — Progress Notes (Signed)
Patient has met adequate criteria for discharge per MD order. Patient discharge summary was printed and given to medical transportation. Report was called to [place]. All invasive lines and hospital equipment were removed. Patient left the hospital escorted by medical transportation via stretcher.

## 2014-10-21 NOTE — Clinical Social Work Note (Signed)
Patient medically stable for discharge back to [facility] today. Discharge paperwork transmitted to facility and patient will be transported back to skilled facility by ambulance. Daughter April contacted is made aware of discharge.  Americo Vallery Givens, MSW, LCSW Licensed Clinical Social Worker [place] [phone]

## 2014-10-21 NOTE — Clinical Social Work Psychosocial (Signed)
Clinical Social Work Department BRIEF PSYCHOSOCIAL ASSESSMENT 10/21/2014  Patient:  Justin [name], Justin [name]     Account Number:  [id number]     Admit date:  10/18/2014  Clinical Social Worker:  Frederico Hamman  Date/Time:  10/21/2014 04:05 AM  Referred by:  Physician  Date Referred:  10/21/2014 Referred for  SNF Placement   Other Referral:   Interview type:  Family Other interview type:    PSYCHOSOCIAL DATA Living Status:  FACILITY Admitted from facility:  [place] Level of care:  [place] Primary support name:  April Borman Primary support relationship to patient:  CHILD, ADULT Degree of support available:   Patient has another daughter, Foye Spurling. Ms. April Nevills presented as caring of patient during conversatin.    CURRENT CONCERNS Current Concerns  Post-Acute Placement   Other Concerns:    SOCIAL WORK ASSESSMENT / PLAN CSW talked with daughter April Chopra by phone [phone]) to confirm discharge plans as patient from [facility] and ready for discharge. Ms. Greenslade confirmed that he is a long-term care resident of [facility] and has been there approximately 6 1/2 years. the plan is for him to return to [facility].   Assessment/plan status:  No Further Intervention Required Other assessment/ plan:   Information/referral to community resources:   None needed or requested.    PATIENT'S/FAMILY'S RESPONSE TO PLAN OF CARE: Ms. Stogsdill was receptive to talking with CSW and confirmed his return to [facility] where he is a long-term care resident.       Lothar Prehn Givens, MSW, LCSW Licensed Clinical Social Worker [place] [phone]

## 2014-10-21 NOTE — Progress Notes (Signed)
MBSS complete. Full report located under chart review in imaging section. Supervised and reviewed by Herbie Baltimore MA CCC-SLP

## 2014-10-21 NOTE — Discharge Summary (Signed)
Discharge Summary  [name] ZLD:[id number] DOB: April 04, 1956  PCP: Garwin Brothers, MD  Admit date: 10/18/2014 Discharge date: 10/21/2014  Time spent: >75mins  Recommendations for Outpatient Follow-up:  1. F/u with PMD within one week, pmd to repeat bmp in one week, pmd to continue adjust insulin dose according to patient's oral intake, pmd to continue adjust blood pressure meds.  Discharge Diagnoses:  Active Hospital Problems   Diagnosis Date Noted  . Hyperkalemia 10/19/2014  . AKI (acute kidney injury) 10/19/2014  . Elevated troponin 10/19/2014  . Renal transplant, status post 10/19/2014  . Failure to thrive in adult 10/19/2014  . UTI (lower urinary tract infection)   . Acute confusional state   . Essential hypertension 05/03/2010  . Intracranial hemorrhage 05/03/2010  . Hyperlipidemia 05/03/2010  . Diabetes mellitus 05/03/2010    Resolved Hospital Problems   Diagnosis Date Noted Date Resolved  No resolved problems to display.    Discharge Condition: stable  Diet recommendation: Puree diet/ nectar thick liquid/heart healthy/carb modified  Diet Recommendations Dysphagia 1 (Puree);Nectar-thick liquid  Liquid Administration via Straw;Cup  Medication Administration Crushed with puree  Compensations Slow rate;Small sips/bites  Postural Changes and/or Swallow Maneuvers Seated upright 90 degrees         Filed Weights   10/19/14 0147 10/20/14 2120  Weight: 78.8 kg (173 lb 11.6 oz) 83.5 kg (184 lb 1.4 oz)    History of present illness:  Justin [name] is a 59 y.o. 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 He was found to have an elevated troponin, and hyperkalemia of 5.7, and an elevated BUN/Cr. Patient is unable to give his  history due to his aphasia.   Hospital Course:  Active Problems:   Diabetes mellitus   Hyperlipidemia   Essential hypertension   Intracranial hemorrhage   Hyperkalemia   AKI (acute kidney injury)   Elevated troponin   Renal transplant, status post   Failure to thrive in adult   UTI (lower urinary tract infection)   Acute confusional state  1. Hyperkalemia no arrhythmia on Telemetry Monitoring Kayexalate 30 grams PO x 1 IVFs  resolved    2. AKI (acute kidney injury) on CKD III  back to baseline with hydration, urine culture negative    3. Elevated troponin , mild, flat in the setting of ARF Telemetry Monitoring, EKG does has st flattening inferolateral leads. patient refused echo                            4. Failure to thrive in adult recieved IVFs, fully alert at discharge,                           speech recommended puree diet with thickened liquid, aspiration precaution.   Family reported patient has been pocketing food prior to admission, suspect progression of baseline confusion, discussed with family about possible peg placement in the future if patient not able to take consistent oral intake, family wished to proceed with peg placement if needed. PMD to continue monitor patient's oral intake, discuss with family with alternative feeding if needed.   cxr ? pna with h/o mrsa , treated with IV vanc/rocephin in the  hospital, continue doxycyline for 10 days at discharge.  MRSA colonization, decolonization with bactroban nasal cream x5days and chlorhexidine gluconate cloth.     5. Diabetes mellitus, insulin dependent Lantus 10 units SQ qhs, ( Reduced Dose due to Poor PO  Intake) SSI coverage PRN  A1c 7.9                         Insulin does need to be continue adjusted according to patient oral intake    6. Hyperlipidemia Continue Atorvastatin Rx    7. Essential hypertension Continue Amlodipine, Hydralazine, imdur    8. 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 Renal transplant, status post Continue Tactolimus and Prednisone Rx  Tac level wnl       Code Status: FULL CODE  Family Communication: duaghter Disposition Plan: return to SNF     Consultants:  none  Procedures:  Modified barium swallow study for dysphagia,   Antibiotics:  Vanc/rocephin for presumed presumed pna?    Discharge Exam: BP 129/40 mmHg  Pulse 68  Temp(Src) 99 F (37.2 C) (Oral)  Resp 18  Wt 83.5 kg (184 lb 1.4 oz)  SpO2 100%   General: NAD  Cardiovascular: RRR  Respiratory: CTABL  Abdomen: Soft/ND/NT, positive BS  Musculoskeletal: No Edema  Neuro: more alert, able to verbalize a few words, chronic deficit  Skin: multiple abrasions in lower extremity, no pus, no drainage.  Discharge Instructions You were cared for by a hospitalist during your hospital stay. If you have any questions about your discharge medications or the care you received while you were in the hospital after you are discharged, you can call the unit and asked to speak with the hospitalist on call if the hospitalist that took care of you is not available. Once you are discharged, your primary care physician will handle any further medical issues. Please note that NO REFILLS for any discharge medications will be authorized once  you are discharged, as it is imperative that you return to your primary care physician (or establish a relationship with a primary care physician if you do not have one) for your aftercare needs so that they can reassess your need for medications and monitor your lab values.      Discharge Instructions    Diet - low sodium heart healthy    Complete by:  As directed      Increase activity slowly    Complete by:  As directed             Medication List    STOP taking these medications        albuterol (2.5 MG/3ML) 0.083% nebulizer solution  Commonly known as:  PROVENTIL     amLODipine 10 MG tablet  Commonly known as:  NORVASC     cefTRIAXone 1 G injection  Commonly known as:  ROCEPHIN     HYDROcodone-acetaminophen 5-500 MG per tablet  Commonly known as:  VICODIN     oxyCODONE-acetaminophen 5-325 MG per tablet  Commonly known as:  PERCOCET/ROXICET     oxyCODONE-acetaminophen 7.5-325 MG per tablet  Commonly known as:  PERCOCET     polyethylene glycol packet  Commonly known as:  MIRALAX / GLYCOLAX      TAKE these medications        allopurinol 100 MG tablet  Commonly known as:  ZYLOPRIM  Take 100 mg by mouth daily.     aspirin 81 MG tablet  Take 81 mg by mouth daily.     atorvastatin 10 MG tablet  Commonly known as:  LIPITOR  Take 10 mg by mouth daily.     baclofen 20 MG tablet  Commonly known as:  LIORESAL  Take 20 mg by mouth 3 (three) times daily.     Chlorhexidine Gluconate Cloth 2 % Pads  Apply 6 each topically daily at 6 (six) AM.     citalopram 10 MG tablet  Commonly known as:  CELEXA  Take 20 mg by mouth daily.     Cranberry 425 MG Caps  Take 425 mg by mouth daily.     dextrose 5 % and 0.45% NaCl 5-0.45 %  - Inject 50 mLs into the vein 2 (two) times daily. 5ml/hr  - 0900 and 1800     doxycycline 100 MG capsule  Commonly known as:  VIBRAMYCIN  Take 1 capsule (100 mg total) by mouth 2 (two) times daily.     fentaNYL 25 MCG/HR patch    Commonly known as:  DURAGESIC - dosed mcg/hr  Place 25 mcg onto the skin every 3 (three) days.     gabapentin 300 MG capsule  Commonly known as:  NEURONTIN  Take 300 mg by mouth 3 (three) times daily.     hydrALAZINE 25 MG tablet  Commonly known as:  APRESOLINE  Take 25 mg by mouth 2 (two) times daily.     insulin aspart 100 UNIT/ML injection  Commonly known as:  novoLOG  Inject 9 Units into the skin 3 (three) times daily before meals.     insulin glargine 100 UNIT/ML injection  Commonly known as:  LANTUS  Inject 0.1 mLs (10 Units total) into the skin at bedtime.     ipratropium-albuterol 0.5-2.5 (3) MG/3ML Soln  Commonly known as:  DUONEB  Take 3 mLs by nebulization every 6 (six) hours as needed (wheezing).     isosorbide dinitrate 10 MG tablet  Commonly known as:  ISORDIL  Take 10 mg by mouth 2 (two) times daily.     levETIRAcetam 500 MG tablet  Commonly known as:  KEPPRA  Take 500 mg by mouth every 12 (twelve) hours.     LORazepam 2 MG/ML injection  Commonly known as:  ATIVAN  Inject 1 mg into the vein every 6 (six) hours as needed for anxiety.     LORazepam 0.5 MG tablet  Commonly known as:  ATIVAN  Take 0.5 mg by mouth every 8 (eight) hours as needed. Anxiety     mupirocin ointment 2 %  Commonly known as:  BACTROBAN  Place 1 application into the nose 2 (two) times daily.     omeprazole 20 MG capsule  Commonly known as:  PRILOSEC  Take 20 mg by mouth daily.     PHOS-NAK PO  Take 1 Package by mouth 3 (three) times daily.     predniSONE 5 MG tablet  Commonly known as:  DELTASONE  Take 5 mg by mouth daily.     senna-docusate 8.6-50 MG per tablet  Commonly known as:  Senokot-S  Take 1 tablet by mouth daily.     tacrolimus 0.5 MG capsule  Commonly known as:  PROGRAF  Take 0.5 mg by mouth 2 (two) times daily.     tacrolimus 1 MG capsule  Commonly known as:  PROGRAF  Take 3 mg by mouth  2 (two) times daily.     tiZANidine 2 MG tablet  Commonly known  as:  ZANAFLEX  Take 2 mg by mouth 2 (two) times daily.     Vitamin D (Ergocalciferol) 50000 UNITS Caps capsule  Commonly known as:  DRISDOL  Take 50,000 Units by mouth every 30 (thirty) days.       Allergies  Allergen Reactions  . Amoxicillin Other (See Comments)    ON MAR  . Ampicillin Other (See Comments)    ON MAR  . Latex Other (See Comments)    ON MAR  . Morphine And Related Other (See Comments)    UNK  . Penicillins Other (See Comments)    ON MAR  . Tape Other (See Comments)    ON MAR   Follow-up Information    Follow up with AMIN, SAAD, MD In 1 week.   Specialty:  Internal Medicine   Why:  post hospital follow up, pmd to repeat bmp in one week   Contact information:   [place] [address] [phone]        The results of significant diagnostics from this hospitalization (including imaging, microbiology, ancillary and laboratory) are listed below for reference.    Significant Diagnostic Studies: Dg Chest 2 View  10/18/2014   CLINICAL DATA:  Patient with failure to thrive. History of hypertension and diabetes.  EXAM: CHEST  2 VIEW  COMPARISON:  Chest radiograph 06/15/2013  FINDINGS: Stable cardiac and mediastinal contours. Low lung volumes. Minimal heterogeneous opacities left lung base. No pleural effusion or pneumothorax. Regional skeleton is unremarkable.  IMPRESSION: Minimal left basilar heterogeneous opacities favored to represent atelectasis.  Low lung volumes.   Electronically Signed   By: Lovey Newcomer M.D.   On: 10/18/2014 22:07   Dg Swallowing Func-speech Pathology  10/21/2014    Objective Swallowing Evaluation:   Completed by Rodena Goldmann, SLP  Student, Supervised and reviewed by Herbie [place] CCC-SLP  Patient Details  Name: ROMOND PIPKINS MRN: [id number] Date of Birth: 02/13/1956  Today's Date: 10/21/2014 Time: SLP Start Time (ACUTE ONLY): 1100-SLP Stop Time (ACUTE ONLY): 1120 SLP Time Calculation (min) (ACUTE ONLY): 20 min  Past  Medical History:  Past Medical History  Diagnosis Date  . Hypertension   . Dyslipidemia   . Carpal tunnel syndrome   . A-V fistula   . Anemia   . Hemorrhoids   . Hyperparathyroidism   . Intracranial hemorrhage   . Hypotension   . Hyperkalemia   . Kidney disease   . Sepsis(995.91)   . GI bleeding   . DVT (deep venous thrombosis)   . PVD (peripheral vascular disease)   . DM (diabetes mellitus)   . Cellulitis   . IV infiltration     Right upper extremity  . PPD positive   . 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 Hypokinesis     EF of 60% with mild anterior hypokinesis, minimal coronary artery  disease on catheterization in December of 2007   Past Surgical History:  Past Surgical History  Procedure Laterality Date  . Kidney transplant      History of renal transplant maintained on chronic immunosuppressive  therapy  . Av fistula placement      Right arm  . Peripherally inserted central catheter insertion      line placement  . Amputation    . Gastrostomy    . 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 Brain biopsy    . #6 shiley cuff      Less trache   HPI:  HPI: GORGE ALMANZA is a 59 y.o. male with a history of and ICH with  Residual Right Sided Hemiplegia with Expressive Aphasia, HTN, DM2, CKD,  S/P Renal Transplant who was sent from the [facility] due to  poor intake of foods and liquids and refusal to take his medications for  the past 3 days.Patient is unable to give his history due to his aphasia.  Nursing reports chagne in swallow function with patient getting chocked on  eggs this am.   No Data Recorded  Assessment / Plan / Recommendation CHL IP CLINICAL IMPRESSIONS 10/21/2014  Dysphagia Diagnosis Mild oral phase dysphagia;Mild pharyngeal phase  dysphagia  Clinical impression Pt demonstrates mild oral and pharyngeal phase  dysphagia characterized by prolonged mastication and delayed swallow  initiation to the level of the pyriforms. Pt aspirated  large consecutive  sips of thin liquids with delayed sensation; flash penetration noted with  small sips. Pt unable to remove aspirate despite cough. Flash penetration  noted with nectar thick liquids. Pt still at risk for aspiration given  delayed swallow initiation and pt's impulsivity and cognitive deficits,  that impact his ability to limit bolus size. Recommend pt continue dys 1/  nectar thick liquid diet, however, when given full supervision to cue for  small sips and slow rate, pt may have thin liquids. Speech will follow-up  with diet tolerance and family education of plan.       CHL IP TREATMENT RECOMMENDATION 10/21/2014  Treatment Plan Recommendations Therapy as outlined in treatment plan below      CHL IP DIET RECOMMENDATION 10/21/2014  Diet Recommendations Dysphagia 1 (Puree);Nectar-thick liquid  Liquid Administration via Straw;Cup  Medication Administration Crushed with puree  Compensations Slow rate;Small sips/bites  Postural Changes and/or Swallow Maneuvers Seated upright 90 degrees     CHL IP OTHER RECOMMENDATIONS 10/21/2014  Recommended Consults (None)  Oral Care Recommendations Oral care BID  Other Recommendations Order thickener from pharmacy     CHL IP FOLLOW UP RECOMMENDATIONS 10/21/2014  Follow up Recommendations Skilled Nursing facility     [facility] IP FREQUENCY AND DURATION 10/21/2014  Speech Therapy Frequency (ACUTE ONLY) min 2x/week  Treatment Duration 2 weeks     Pertinent Vitals/Pain NA    SLP Swallow Goals No flowsheet data found.  No flowsheet data found.    CHL IP REASON FOR REFERRAL 10/21/2014  Reason for Referral Objectively evaluate swallowing function     CHL IP ORAL PHASE 10/21/2014  Lips (None)  Tongue (None)  Mucous membranes (None)  Nutritional status (None)  Other (None)  Oxygen therapy (None)  Oral Phase Impaired  Oral - Pudding Teaspoon (None)  Oral - Pudding Cup (None)  Oral - Honey Teaspoon (None)  Oral - Honey Cup (None)  Oral - Honey Syringe (None)  Oral - Nectar Teaspoon (None)   Oral - Nectar Cup Lingual pumping  Oral - Nectar Straw Lingual pumping  Oral - Nectar Syringe (None)  Oral - Ice Chips (None)  Oral - Thin Teaspoon (None)  Oral - Thin Cup Lingual pumping  Oral - Thin Straw Lingual pumping  Oral - Thin Syringe (None)  Oral - Puree Other (Comment)  Oral - Mechanical Soft (None)  Oral - Regular Other (Comment)  Oral - Multi-consistency (None)  Oral - Pill (None)  Oral Phase - Comment (None)  CHL IP PHARYNGEAL PHASE 10/21/2014  Pharyngeal Phase Impaired  Pharyngeal - Pudding Teaspoon (None)  Penetration/Aspiration details (pudding teaspoon) (None)  Pharyngeal - Pudding Cup (None)  Penetration/Aspiration details (pudding cup) (None)  Pharyngeal - Honey Teaspoon (None)  Penetration/Aspiration details (honey teaspoon) (None)  Pharyngeal - Honey Cup (None)  Penetration/Aspiration details (honey cup) (None)  Pharyngeal - Honey Syringe (None)  Penetration/Aspiration details (honey syringe) (None)  Pharyngeal - Nectar Teaspoon (None)  Penetration/Aspiration details (nectar teaspoon) (None)  Pharyngeal - Nectar Cup Delayed swallow initiation;Penetration/Aspiration  during swallow  Penetration/Aspiration details (nectar cup) Material enters airway,  remains ABOVE vocal cords then ejected out  Pharyngeal - Nectar Straw Delayed swallow  initiation;Penetration/Aspiration during swallow  Penetration/Aspiration details (nectar straw) Material enters airway,  remains ABOVE vocal cords then ejected out  Pharyngeal - Nectar Syringe (None)  Penetration/Aspiration details (nectar syringe) (None)  Pharyngeal - Ice Chips (None)  Penetration/Aspiration details (ice chips) (None)  Pharyngeal - Thin Teaspoon (None)  Penetration/Aspiration details (thin teaspoon) (None)  Pharyngeal - Thin Cup Delayed swallow initiation;Penetration/Aspiration  during swallow  Penetration/Aspiration details (thin cup) Material enters airway, remains  ABOVE vocal cords then ejected out  Pharyngeal - Thin Straw Delayed swallow  initiation;Penetration/Aspiration  during swallow  Penetration/Aspiration details (thin straw) Material enters airway, passes  BELOW cords and not ejected out despite cough attempt by patient  Pharyngeal - Thin Syringe (None)  Penetration/Aspiration details (thin syringe') (None)  Pharyngeal - Puree Delayed swallow initiation  Penetration/Aspiration details (puree) (None)  Pharyngeal - Mechanical Soft (None)  Penetration/Aspiration details (mechanical soft) (None)  Pharyngeal - Regular Delayed swallow initiation  Penetration/Aspiration details (regular) (None)  Pharyngeal - Multi-consistency (None)  Penetration/Aspiration details (multi-consistency) (None)  Pharyngeal - Pill (None)  Penetration/Aspiration details (pill) (None)  Pharyngeal Comment (None)     CHL IP CERVICAL ESOPHAGEAL PHASE 10/21/2014  Cervical Esophageal Phase WFL  Pudding Teaspoon (None)  Pudding Cup (None)  Honey Teaspoon (None)  Honey Cup (None)  Honey Syringe (None)  Nectar Teaspoon (None)  Nectar Cup (None)  Nectar Straw (None)  Nectar Syringe (None)  Thin Teaspoon (None)  Thin Cup (None)  Thin Straw (None)  Thin Syringe (None)  Cervical Esophageal Comment (None)    No flowsheet data found.        Supervised and reviewed by [facility] MA CCC-SLP  De[place], Katherene Ponto 10/21/2014, 12:13 PM     Microbiology: Recent Results (from the past 240 hour(s))  Urine culture     Status: None   Collection Time: 10/18/14  9:35 PM  Result Value Ref Range Status   Specimen Description URINE, RANDOM  Final   Special Requests Immunocompromised  Final   Colony Count NO GROWTH Performed at [facility]   Final   Culture NO GROWTH Performed at [facility]   Final   Report Status 10/20/2014 FINAL  Final  MRSA PCR Screening     Status: Abnormal   Collection Time: 10/19/14  5:20 AM  Result Value Ref Range Status   MRSA by PCR POSITIVE (A) NEGATIVE Final    Comment:        The GeneXpert MRSA Assay (FDA approved for NASAL  specimens only), is one component of a comprehensive MRSA colonization surveillance program. It is not intended to diagnose MRSA infection nor to guide or monitor treatment for MRSA infections. RESULT CALLED TO, READ BACK BY AND VERIFIED WITH: REBECCA SPARKS RN AT 2229 10/19/14 BY [facility]      Labs: Basic Metabolic Panel:  Recent Labs  Lab 10/18/14 2030 10/19/14 0505 10/20/14 0455 10/21/14 0625  NA 144 144 142 141  K 5.7* 4.1 4.4 4.0  CL 107 107 109 107  CO2 24 27 23 27   GLUCOSE 137* 135* 224* 87  BUN 36* 33* 36* 24*  CREATININE 1.93* 1.75* 1.71* 1.44*  CALCIUM 10.7* 10.0 10.0 9.8   Liver Function Tests:  Recent Labs Lab 10/18/14 2030 10/20/14 0455  AST 48* 32  ALT 20 16  ALKPHOS 84 75  BILITOT 1.6* 1.0  PROT 8.0 7.0  ALBUMIN 2.9* 2.5*   No results for input(s): LIPASE, AMYLASE in the last 168 hours. No results for input(s): AMMONIA in the last 168 hours. CBC:  Recent Labs Lab 10/18/14 2030 10/19/14 0505 10/20/14 0455  WBC 6.3 6.0 5.4  NEUTROABS 4.6  --   --   HGB 16.1 15.0 15.3  HCT 51.9 50.5 51.5  MCV 89.3 90.3 90.2  PLT 169 160 154   Cardiac Enzymes:  Recent Labs Lab 10/18/14 2134 10/20/14 0455  TROPONINI 0.11* 0.08*   BNP: BNP (last 3 results) No results for input(s): BNP in the last 8760 hours.  ProBNP (last 3 results) No results for input(s): PROBNP in the last 8760 hours.  CBG:  Recent Labs Lab 10/20/14 1148 10/20/14 1649 10/20/14 2117 10/21/14 0758 10/21/14 1206  GLUCAP 166* 220* 169* 73 141*       Signed:  Waylin Dorko MD, PhD  [facility] 10/21/2014, 2:36 PM

## 2014-10-21 NOTE — Progress Notes (Signed)
Inpatient Diabetes Program Recommendations  AACE/ADA: New Consensus Statement on Inpatient Glycemic Control (2013)  Target Ranges:  Prepandial:   less than 140 mg/dL      Peak postprandial:   less than 180 mg/dL (1-2 hours)      Critically ill patients:  140 - 180 mg/dL   Reason for Assessment:  Results for DEQUANE, STRAHAN (MRN [id number]) as of 10/21/2014 10:58  Ref. Range 10/20/2014 07:52 10/20/2014 11:48 10/20/2014 16:49 10/20/2014 21:17 10/21/2014 07:58  Glucose-Capillary Latest Ref Range: 70-99 mg/dL 188 (H) 166 (H) 220 (H) 169 (H) 73    Consider reducing Lantus to 12 units q HS.  Also consider adding Novolog sensitive tid with meals.  Thanks, Adah Perl, RN, BC-ADM Inpatient Diabetes Coordinator Pager [phone] (8a-5p)

## 2014-10-21 NOTE — Progress Notes (Signed)
ANTIBIOTIC CONSULT NOTE - FOLLOW UP  Pharmacy Consult for vancomycin Indication: rule out pneumonia and rule out skin infection  Allergies  Allergen Reactions  . Amoxicillin Other (See Comments)    ON MAR  . Ampicillin Other (See Comments)    ON MAR  . Latex Other (See Comments)    ON MAR  . Morphine And Related Other (See Comments)    UNK  . Penicillins Other (See Comments)    ON MAR  . Tape Other (See Comments)    ON MAR    Patient Measurements: Weight: 184 lb 1.4 oz (83.5 kg)  Vital Signs: Temp: 99 F (37.2 C) (04/19 0604) Temp Source: Oral (04/19 0604) BP: 129/40 mmHg (04/19 0604) Pulse Rate: 68 (04/19 0604) Intake/Output from previous day: 04/18 0701 - 04/19 0700 In: 6063 [P.O.:1090; I.V.:650] Out: -  Intake/Output from this shift: Total I/O In: 354 [P.O.:354] Out: -   Labs:  Recent Labs  10/18/14 2030 10/19/14 0505 10/20/14 0455 10/21/14 0625  WBC 6.3 6.0 5.4  --   HGB 16.1 15.0 15.3  --   PLT 169 160 154  --   CREATININE 1.93* 1.75* 1.71* 1.44*   Estimated Creatinine Clearance: 58.1 mL/min (by C-G formula based on Cr of 1.44). No results for input(s): VANCOTROUGH, VANCOPEAK, VANCORANDOM, GENTTROUGH, GENTPEAK, GENTRANDOM, TOBRATROUGH, TOBRAPEAK, TOBRARND, AMIKACINPEAK, AMIKACINTROU, AMIKACIN in the last 72 hours.   Microbiology: Recent Results (from the past 720 hour(s))  Urine culture     Status: None   Collection Time: 10/18/14  9:35 PM  Result Value Ref Range Status   Specimen Description URINE, RANDOM  Final   Special Requests Immunocompromised  Final   Colony Count NO GROWTH Performed at [facility]   Final   Culture NO GROWTH Performed at [facility]   Final   Report Status 10/20/2014 FINAL  Final  MRSA PCR Screening     Status: Abnormal   Collection Time: 10/19/14  5:20 AM  Result Value Ref Range Status   MRSA by PCR POSITIVE (A) NEGATIVE Final    Comment:        The GeneXpert MRSA Assay (FDA approved for NASAL  specimens only), is one component of a comprehensive MRSA colonization surveillance program. It is not intended to diagnose MRSA infection nor to guide or monitor treatment for MRSA infections. RESULT CALLED TO, READ BACK BY AND VERIFIED WITH: REBECCA SPARKS RN AT 0911 10/19/14 BY [facility]     Anti-infectives    Start     Dose/Rate Route Frequency Ordered Stop   10/21/14 2000  vancomycin (VANCOCIN) 1,500 mg in sodium chloride 0.9 % 500 mL IVPB     1,500 mg 250 mL/hr over 120 Minutes Intravenous Every 24 hours 10/21/14 1051     10/19/14 2200  cefTRIAXone (ROCEPHIN) 1 g in dextrose 5 % 50 mL IVPB     1 g 100 mL/hr over 30 Minutes Intravenous Every 24 hours 10/19/14 0114     10/19/14 2000  vancomycin (VANCOCIN) 1,250 mg in sodium chloride 0.9 % 250 mL IVPB  Status:  Discontinued     1,250 mg 166.7 mL/hr over 90 Minutes Intravenous Every 24 hours 10/19/14 1916 10/21/14 1051   10/18/14 2230  cefTRIAXone (ROCEPHIN) 1 g in dextrose 5 % 50 mL IVPB     1 g 100 mL/hr over 30 Minutes Intravenous  Once 10/18/14 2228 10/19/14 0100      Assessment: 59 y/o male who presented to the hospital due to poor intake.  He is on day 3 vancomycin and day 4 ceftriaxone originally for suspected UTI. Urine culture is negative. Spoke with Dr. Erlinda Hong yesterday who wanted to continue vancomycin for possible PNA and/or skin infection. Renal function has improved, SCr down to 1.44, no UOP recorded. He is afebrile and WBC are normal.  Vanc 4/17>>  Ceftriaxone 4/16>>  4/16 UCx - neg 4/17 MRSA PCR +  Goal of Therapy:  Vancomycin trough level 10-15 mcg/ml  Plan:  - Increase vancomycin to 1500 mg IV q24h - Monitor renal function, clinical progress, and LOT  [facility], Pharm.D., BCPS Clinical Pharmacist Pager: [phone] 10/21/2014 10:52 AM

## 2014-10-22 ENCOUNTER — Ambulatory Visit (INDEPENDENT_AMBULATORY_CARE_PROVIDER_SITE_OTHER): Payer: Medicaid Other | Admitting: Podiatry

## 2014-10-22 ENCOUNTER — Ambulatory Visit: Payer: Medicaid Other | Admitting: Podiatry

## 2014-10-22 DIAGNOSIS — E1351 Other specified diabetes mellitus with diabetic peripheral angiopathy without gangrene: Secondary | ICD-10-CM

## 2014-10-22 NOTE — Progress Notes (Signed)
Patient ID: Justin [name], male   DOB: 11/13/1955, 59 y.o.   MRN: [id number] Patient presents for diabetic shoe pick up shoes are tried on and are not a good fit we will reorder shoes that are deeper in the toes and forefoot.    Reorder diabetic shoes for corrected size notify patient upon receipt of replacement diabetic shoes

## 2014-11-19 ENCOUNTER — Ambulatory Visit (INDEPENDENT_AMBULATORY_CARE_PROVIDER_SITE_OTHER): Payer: Medicaid Other | Admitting: Podiatry

## 2014-11-19 DIAGNOSIS — E1351 Other specified diabetes mellitus with diabetic peripheral angiopathy without gangrene: Secondary | ICD-10-CM

## 2014-11-19 NOTE — Progress Notes (Signed)
   Subjective:    Patient ID: Justin [name], male    DOB: [birth year], 59 y.o.   MRN: [id number]  HPI  patient presents today for dispensing of replacement diabetic shoes and 3 sets of custom molded inserts  Review of Systems     Objective:   Physical Exam  Patient is seated in a wheelchair and not able to understand his vocalizations. A relatives with them in the treatment room The existing diabetic shoe Apex 11.5, wide, brown does not have insoles Apex Black double strap 11.5 wide are exact replacement of his existing shoes  The custom molded insoles are too  Wide & long       Assessment & Plan:   Assessment: Confused patient Satisfactory fit of diabetic shoes On satisfactory fit of custom insoles  Plan: The shoes and shoe insoles were not dispensed Will reorder replacement insoles and notify patient upon replacement of insoles  Requested that patient's relative check Justin [name]'s home to see if they could locate the previous custom insoles that were dispensed with the shoes and place him in the shoes

## 2014-11-19 NOTE — Patient Instructions (Signed)
Replacement diabetic shoes with a correct size  The custom molded insoles were not correctly sized and will need to be re-made Our office will contact you when replacement insoles are available

## 2014-12-31 ENCOUNTER — Ambulatory Visit: Payer: Medicaid Other | Admitting: Podiatry

## 2014-12-31 DIAGNOSIS — Z89421 Acquired absence of other right toe(s): Secondary | ICD-10-CM

## 2014-12-31 DIAGNOSIS — M2142 Flat foot [pes planus] (acquired), left foot: Secondary | ICD-10-CM

## 2014-12-31 DIAGNOSIS — Z89422 Acquired absence of other left toe(s): Secondary | ICD-10-CM

## 2014-12-31 DIAGNOSIS — M2141 Flat foot [pes planus] (acquired), right foot: Secondary | ICD-10-CM

## 2014-12-31 DIAGNOSIS — E1151 Type 2 diabetes mellitus with diabetic peripheral angiopathy without gangrene: Secondary | ICD-10-CM

## 2014-12-31 NOTE — Progress Notes (Signed)
   Subjective:    Patient ID: Justin [name], male    DOB: Apr 17, 1956, 59 y.o.   MRN: [id number]  HPI  Patient presents with daughter for diabetic shoe fitting, shoes are tried on for good fit.  Patient receives 1 pair Apex X920M Velcro black athletic walker in men's size 11.5 wide, with 3 pairs custom molded diabetic inserts.  Written break in and wear instructions given.    Review of Systems     Objective:   Physical Exam        Assessment & Plan:

## 2014-12-31 NOTE — Patient Instructions (Signed)

## 2015-02-25 ENCOUNTER — Inpatient Hospital Stay (HOSPITAL_COMMUNITY)
Admission: EM | Admit: 2015-02-25 | Discharge: 2015-03-06 | DRG: 871 | Disposition: A | Discharge status: Skilled Nursing Facility | Payer: Medicare Other | Attending: Internal Medicine | Admitting: Internal Medicine

## 2015-02-25 DIAGNOSIS — R4182 Altered mental status, unspecified: Secondary | ICD-10-CM

## 2015-02-25 DIAGNOSIS — Z1611 Resistance to penicillins: Secondary | ICD-10-CM | POA: Diagnosis present

## 2015-02-25 DIAGNOSIS — R509 Fever, unspecified: Secondary | ICD-10-CM | POA: Diagnosis present

## 2015-02-25 DIAGNOSIS — Z89422 Acquired absence of other left toe(s): Secondary | ICD-10-CM

## 2015-02-25 DIAGNOSIS — Z880 Allergy status to penicillin: Secondary | ICD-10-CM

## 2015-02-25 DIAGNOSIS — Z79899 Other long term (current) drug therapy: Secondary | ICD-10-CM

## 2015-02-25 DIAGNOSIS — E119 Type 2 diabetes mellitus without complications: Secondary | ICD-10-CM

## 2015-02-25 DIAGNOSIS — I629 Nontraumatic intracranial hemorrhage, unspecified: Secondary | ICD-10-CM

## 2015-02-25 DIAGNOSIS — B9620 Unspecified Escherichia coli [E. coli] as the cause of diseases classified elsewhere: Secondary | ICD-10-CM | POA: Diagnosis present

## 2015-02-25 DIAGNOSIS — Z885 Allergy status to narcotic agent status: Secondary | ICD-10-CM

## 2015-02-25 DIAGNOSIS — Z431 Encounter for attention to gastrostomy: Secondary | ICD-10-CM

## 2015-02-25 DIAGNOSIS — A419 Sepsis, unspecified organism: Secondary | ICD-10-CM | POA: Diagnosis present

## 2015-02-25 DIAGNOSIS — I10 Essential (primary) hypertension: Secondary | ICD-10-CM | POA: Diagnosis present

## 2015-02-25 DIAGNOSIS — A4151 Sepsis due to Escherichia coli [E. coli]: Secondary | ICD-10-CM | POA: Diagnosis not present

## 2015-02-25 DIAGNOSIS — F0390 Unspecified dementia without behavioral disturbance: Secondary | ICD-10-CM | POA: Diagnosis present

## 2015-02-25 DIAGNOSIS — G8191 Hemiplegia, unspecified affecting right dominant side: Secondary | ICD-10-CM | POA: Diagnosis present

## 2015-02-25 DIAGNOSIS — I129 Hypertensive chronic kidney disease with stage 1 through stage 4 chronic kidney disease, or unspecified chronic kidney disease: Secondary | ICD-10-CM | POA: Diagnosis present

## 2015-02-25 DIAGNOSIS — E1151 Type 2 diabetes mellitus with diabetic peripheral angiopathy without gangrene: Secondary | ICD-10-CM | POA: Diagnosis present

## 2015-02-25 DIAGNOSIS — N179 Acute kidney failure, unspecified: Secondary | ICD-10-CM | POA: Diagnosis present

## 2015-02-25 DIAGNOSIS — F32A Depression, unspecified: Secondary | ICD-10-CM | POA: Diagnosis present

## 2015-02-25 DIAGNOSIS — M109 Gout, unspecified: Secondary | ICD-10-CM | POA: Diagnosis present

## 2015-02-25 DIAGNOSIS — R0602 Shortness of breath: Secondary | ICD-10-CM

## 2015-02-25 DIAGNOSIS — N183 Chronic kidney disease, stage 3 (moderate): Secondary | ICD-10-CM | POA: Diagnosis present

## 2015-02-25 DIAGNOSIS — Z7982 Long term (current) use of aspirin: Secondary | ICD-10-CM

## 2015-02-25 DIAGNOSIS — R1310 Dysphagia, unspecified: Secondary | ICD-10-CM

## 2015-02-25 DIAGNOSIS — Z940 Kidney transplant status: Secondary | ICD-10-CM

## 2015-02-25 DIAGNOSIS — E860 Dehydration: Secondary | ICD-10-CM | POA: Diagnosis present

## 2015-02-25 DIAGNOSIS — G9340 Encephalopathy, unspecified: Secondary | ICD-10-CM | POA: Diagnosis present

## 2015-02-25 DIAGNOSIS — R4701 Aphasia: Secondary | ICD-10-CM | POA: Diagnosis present

## 2015-02-25 DIAGNOSIS — Z89421 Acquired absence of other right toe(s): Secondary | ICD-10-CM

## 2015-02-25 DIAGNOSIS — F329 Major depressive disorder, single episode, unspecified: Secondary | ICD-10-CM | POA: Diagnosis present

## 2015-02-25 DIAGNOSIS — Z881 Allergy status to other antibiotic agents status: Secondary | ICD-10-CM

## 2015-02-25 DIAGNOSIS — Z794 Long term (current) use of insulin: Secondary | ICD-10-CM

## 2015-02-25 DIAGNOSIS — Z91040 Latex allergy status: Secondary | ICD-10-CM

## 2015-02-25 DIAGNOSIS — N390 Urinary tract infection, site not specified: Secondary | ICD-10-CM | POA: Diagnosis present

## 2015-02-25 DIAGNOSIS — Z4659 Encounter for fitting and adjustment of other gastrointestinal appliance and device: Secondary | ICD-10-CM

## 2015-02-25 DIAGNOSIS — R7989 Other specified abnormal findings of blood chemistry: Secondary | ICD-10-CM

## 2015-02-25 DIAGNOSIS — E785 Hyperlipidemia, unspecified: Secondary | ICD-10-CM | POA: Diagnosis present

## 2015-02-25 DIAGNOSIS — I619 Nontraumatic intracerebral hemorrhage, unspecified: Secondary | ICD-10-CM | POA: Diagnosis present

## 2015-02-25 DIAGNOSIS — J9811 Atelectasis: Secondary | ICD-10-CM | POA: Diagnosis present

## 2015-02-25 DIAGNOSIS — E875 Hyperkalemia: Secondary | ICD-10-CM | POA: Diagnosis present

## 2015-02-25 DIAGNOSIS — I739 Peripheral vascular disease, unspecified: Secondary | ICD-10-CM | POA: Diagnosis present

## 2015-02-25 DIAGNOSIS — Z87891 Personal history of nicotine dependence: Secondary | ICD-10-CM

## 2015-02-25 DIAGNOSIS — Z79891 Long term (current) use of opiate analgesic: Secondary | ICD-10-CM

## 2015-02-25 DIAGNOSIS — E1122 Type 2 diabetes mellitus with diabetic chronic kidney disease: Secondary | ICD-10-CM | POA: Diagnosis present

## 2015-02-25 DIAGNOSIS — Z7952 Long term (current) use of systemic steroids: Secondary | ICD-10-CM

## 2015-02-25 DIAGNOSIS — K219 Gastro-esophageal reflux disease without esophagitis: Secondary | ICD-10-CM | POA: Diagnosis present

## 2015-02-25 HISTORY — DX: Gout, unspecified: M10.9

## 2015-02-25 NOTE — ED Notes (Signed)
Patient from [facility], patient has had a temperature earlier today of 101 and given 650mg  of Tylenol.  Patient continued with fever later this evening of 103 and EMS called to transport per family wishes.  Patient with previous stroke with left side weakness.  Patient does not speak, follows some commands.  Patient has chronic pain with fentanyl patch on at this time.  Patient is warm to touch.  Patient is a dialysis patient and diabetic, CBG by EMS was 329 on scene.

## 2015-02-26 ENCOUNTER — Encounter (HOSPITAL_COMMUNITY): Payer: Self-pay | Admitting: Emergency Medicine

## 2015-02-26 ENCOUNTER — Emergency Department (HOSPITAL_COMMUNITY): Payer: Medicare Other

## 2015-02-26 DIAGNOSIS — E785 Hyperlipidemia, unspecified: Secondary | ICD-10-CM

## 2015-02-26 DIAGNOSIS — E875 Hyperkalemia: Secondary | ICD-10-CM | POA: Diagnosis present

## 2015-02-26 DIAGNOSIS — G8191 Hemiplegia, unspecified affecting right dominant side: Secondary | ICD-10-CM | POA: Diagnosis present

## 2015-02-26 DIAGNOSIS — Z881 Allergy status to other antibiotic agents status: Secondary | ICD-10-CM | POA: Diagnosis not present

## 2015-02-26 DIAGNOSIS — Z91040 Latex allergy status: Secondary | ICD-10-CM | POA: Diagnosis not present

## 2015-02-26 DIAGNOSIS — M109 Gout, unspecified: Secondary | ICD-10-CM | POA: Diagnosis present

## 2015-02-26 DIAGNOSIS — N390 Urinary tract infection, site not specified: Secondary | ICD-10-CM | POA: Diagnosis present

## 2015-02-26 DIAGNOSIS — I10 Essential (primary) hypertension: Secondary | ICD-10-CM | POA: Diagnosis not present

## 2015-02-26 DIAGNOSIS — R4701 Aphasia: Secondary | ICD-10-CM | POA: Diagnosis present

## 2015-02-26 DIAGNOSIS — I129 Hypertensive chronic kidney disease with stage 1 through stage 4 chronic kidney disease, or unspecified chronic kidney disease: Secondary | ICD-10-CM | POA: Diagnosis present

## 2015-02-26 DIAGNOSIS — Z940 Kidney transplant status: Secondary | ICD-10-CM | POA: Diagnosis not present

## 2015-02-26 DIAGNOSIS — N179 Acute kidney failure, unspecified: Secondary | ICD-10-CM | POA: Diagnosis present

## 2015-02-26 DIAGNOSIS — Z885 Allergy status to narcotic agent status: Secondary | ICD-10-CM | POA: Diagnosis not present

## 2015-02-26 DIAGNOSIS — F329 Major depressive disorder, single episode, unspecified: Secondary | ICD-10-CM | POA: Diagnosis present

## 2015-02-26 DIAGNOSIS — Z7952 Long term (current) use of systemic steroids: Secondary | ICD-10-CM | POA: Diagnosis not present

## 2015-02-26 DIAGNOSIS — K219 Gastro-esophageal reflux disease without esophagitis: Secondary | ICD-10-CM | POA: Diagnosis present

## 2015-02-26 DIAGNOSIS — F0390 Unspecified dementia without behavioral disturbance: Secondary | ICD-10-CM | POA: Diagnosis present

## 2015-02-26 DIAGNOSIS — E860 Dehydration: Secondary | ICD-10-CM | POA: Diagnosis present

## 2015-02-26 DIAGNOSIS — Z1611 Resistance to penicillins: Secondary | ICD-10-CM | POA: Diagnosis present

## 2015-02-26 DIAGNOSIS — J9811 Atelectasis: Secondary | ICD-10-CM | POA: Diagnosis present

## 2015-02-26 DIAGNOSIS — G9340 Encephalopathy, unspecified: Secondary | ICD-10-CM | POA: Diagnosis present

## 2015-02-26 DIAGNOSIS — A4151 Sepsis due to Escherichia coli [E. coli]: Secondary | ICD-10-CM | POA: Diagnosis present

## 2015-02-26 DIAGNOSIS — N183 Chronic kidney disease, stage 3 (moderate): Secondary | ICD-10-CM

## 2015-02-26 DIAGNOSIS — A419 Sepsis, unspecified organism: Secondary | ICD-10-CM | POA: Diagnosis not present

## 2015-02-26 DIAGNOSIS — I619 Nontraumatic intracerebral hemorrhage, unspecified: Secondary | ICD-10-CM | POA: Diagnosis present

## 2015-02-26 DIAGNOSIS — F32A Depression, unspecified: Secondary | ICD-10-CM | POA: Diagnosis present

## 2015-02-26 DIAGNOSIS — Z79891 Long term (current) use of opiate analgesic: Secondary | ICD-10-CM | POA: Diagnosis not present

## 2015-02-26 DIAGNOSIS — Z794 Long term (current) use of insulin: Secondary | ICD-10-CM | POA: Diagnosis not present

## 2015-02-26 DIAGNOSIS — Z89421 Acquired absence of other right toe(s): Secondary | ICD-10-CM | POA: Diagnosis not present

## 2015-02-26 DIAGNOSIS — Z880 Allergy status to penicillin: Secondary | ICD-10-CM | POA: Diagnosis not present

## 2015-02-26 DIAGNOSIS — Z87891 Personal history of nicotine dependence: Secondary | ICD-10-CM | POA: Diagnosis not present

## 2015-02-26 DIAGNOSIS — Z79899 Other long term (current) drug therapy: Secondary | ICD-10-CM | POA: Diagnosis not present

## 2015-02-26 DIAGNOSIS — B9620 Unspecified Escherichia coli [E. coli] as the cause of diseases classified elsewhere: Secondary | ICD-10-CM | POA: Diagnosis present

## 2015-02-26 DIAGNOSIS — R509 Fever, unspecified: Secondary | ICD-10-CM | POA: Diagnosis present

## 2015-02-26 DIAGNOSIS — Z7982 Long term (current) use of aspirin: Secondary | ICD-10-CM | POA: Diagnosis not present

## 2015-02-26 DIAGNOSIS — R1310 Dysphagia, unspecified: Secondary | ICD-10-CM | POA: Diagnosis present

## 2015-02-26 DIAGNOSIS — Z89422 Acquired absence of other left toe(s): Secondary | ICD-10-CM | POA: Diagnosis not present

## 2015-02-26 DIAGNOSIS — E1151 Type 2 diabetes mellitus with diabetic peripheral angiopathy without gangrene: Secondary | ICD-10-CM | POA: Diagnosis present

## 2015-02-26 DIAGNOSIS — E1122 Type 2 diabetes mellitus with diabetic chronic kidney disease: Secondary | ICD-10-CM | POA: Diagnosis present

## 2015-02-26 LAB — GLUCOSE, CAPILLARY
GLUCOSE-CAPILLARY: 298 mg/dL — AB (ref 65–99)
GLUCOSE-CAPILLARY: 255 mg/dL — AB (ref 65–99)
GLUCOSE-CAPILLARY: 221 mg/dL — AB (ref 65–99)
Glucose-Capillary: 390 mg/dL — ABNORMAL HIGH (ref 65–99)

## 2015-02-26 LAB — COMPREHENSIVE METABOLIC PANEL
ALBUMIN: 2.4 g/dL — AB (ref 3.5–5.0)
ALT: 22 U/L (ref 17–63)
ANION GAP: 8 (ref 5–15)
AST: 32 U/L (ref 15–41)
Alkaline Phosphatase: 90 U/L (ref 38–126)
BUN: 34 mg/dL — ABNORMAL HIGH (ref 6–20)
CO2: 28 mmol/L (ref 22–32)
Calcium: 9.9 mg/dL (ref 8.9–10.3)
Chloride: 109 mmol/L (ref 101–111)
Creatinine, Ser: 2.34 mg/dL — ABNORMAL HIGH (ref 0.61–1.24)
GFR calc Af Amer: 33 mL/min — ABNORMAL LOW (ref >60)
GFR calc non Af Amer: 28 mL/min — ABNORMAL LOW (ref >60)
Glucose, Bld: 349 mg/dL — ABNORMAL HIGH (ref 65–99)
Potassium: 4.9 mmol/L (ref 3.5–5.1)
SODIUM: 145 mmol/L (ref 135–145)
TOTAL PROTEIN: 7.2 g/dL (ref 6.5–8.1)
Total Bilirubin: 0.4 mg/dL (ref 0.3–1.2)

## 2015-02-26 LAB — URINE MICROSCOPIC-ADD ON

## 2015-02-26 LAB — I-STAT CG4 LACTIC ACID, ED: LACTIC ACID, VENOUS: 2.04 mmol/L — AB (ref 0.5–2.0)

## 2015-02-26 LAB — CBC WITH DIFFERENTIAL/PLATELET
BASOS ABS: 0 10*3/uL (ref 0.0–0.1)
BASOS PCT: 0 % (ref 0–1)
Eosinophils Absolute: 0 10*3/uL (ref 0.0–0.7)
Eosinophils Relative: 0 % (ref 0–5)
HEMATOCRIT: 47.9 % (ref 39.0–52.0)
HEMOGLOBIN: 14.3 g/dL (ref 13.0–17.0)
LYMPHS PCT: 23 % (ref 12–46)
Lymphs Abs: 1.2 10*3/uL (ref 0.7–4.0)
MCH: 27.3 pg (ref 26.0–34.0)
MCHC: 29.9 g/dL — ABNORMAL LOW (ref 30.0–36.0)
MCV: 91.6 fL (ref 78.0–100.0)
Monocytes Absolute: 0.4 10*3/uL (ref 0.1–1.0)
Monocytes Relative: 7 % (ref 3–12)
NEUTROS ABS: 3.7 10*3/uL (ref 1.7–7.7)
Neutrophils Relative %: 70 % (ref 43–77)
Platelets: 135 10*3/uL — ABNORMAL LOW (ref 150–400)
RBC: 5.23 MIL/uL (ref 4.22–5.81)
RDW: 14.7 % (ref 11.5–15.5)
WBC: 5.3 10*3/uL (ref 4.0–10.5)

## 2015-02-26 LAB — BASIC METABOLIC PANEL
ANION GAP: 5 (ref 5–15)
BUN: 28 mg/dL — ABNORMAL HIGH (ref 6–20)
CO2: 29 mmol/L (ref 22–32)
Calcium: 8.8 mg/dL — ABNORMAL LOW (ref 8.9–10.3)
Chloride: 110 mmol/L (ref 101–111)
Creatinine, Ser: 1.85 mg/dL — ABNORMAL HIGH (ref 0.61–1.24)
GFR calc non Af Amer: 38 mL/min — ABNORMAL LOW (ref >60)
GFR, EST AFRICAN AMERICAN: 44 mL/min — AB (ref >60)
GLUCOSE: 321 mg/dL — AB (ref 65–99)
POTASSIUM: 4.4 mmol/L (ref 3.5–5.1)
Sodium: 144 mmol/L (ref 135–145)

## 2015-02-26 LAB — LIPID PANEL
CHOL/HDL RATIO: 4.7 ratio
Cholesterol: 93 mg/dL (ref 0–200)
HDL: 20 mg/dL — AB (ref >40)
LDL CALC: 39 mg/dL (ref 0–99)
TRIGLYCERIDES: 170 mg/dL — AB (ref <150)
VLDL: 34 mg/dL (ref 0–40)

## 2015-02-26 LAB — TROPONIN I
Troponin I: 0.08 ng/mL — ABNORMAL HIGH (ref <0.031)
Troponin I: 0.03 ng/mL (ref <0.031)
Troponin I: 0.05 ng/mL — ABNORMAL HIGH (ref <0.031)

## 2015-02-26 LAB — CBC
HEMATOCRIT: 44.2 % (ref 39.0–52.0)
HEMOGLOBIN: 13 g/dL (ref 13.0–17.0)
MCH: 26.9 pg (ref 26.0–34.0)
MCHC: 29.4 g/dL — AB (ref 30.0–36.0)
MCV: 91.5 fL (ref 78.0–100.0)
Platelets: 94 10*3/uL — ABNORMAL LOW (ref 150–400)
RBC: 4.83 MIL/uL (ref 4.22–5.81)
RDW: 15 % (ref 11.5–15.5)
WBC: 4.8 10*3/uL (ref 4.0–10.5)

## 2015-02-26 LAB — URINALYSIS, ROUTINE W REFLEX MICROSCOPIC
Bilirubin Urine: NEGATIVE
Glucose, UA: >1000 mg/dL — AB
Ketones, ur: NEGATIVE mg/dL
NITRITE: POSITIVE — AB
PH: 6.5 (ref 5.0–8.0)
Protein, ur: 30 mg/dL — AB
Specific Gravity, Urine: 1.02 (ref 1.005–1.030)
UROBILINOGEN UA: 0.2 mg/dL (ref 0.0–1.0)

## 2015-02-26 LAB — APTT: aPTT: 29 seconds (ref 24–37)

## 2015-02-26 LAB — PROTIME-INR
INR: 1.21 (ref 0.00–1.49)
Prothrombin Time: 15.5 seconds — ABNORMAL HIGH (ref 11.6–15.2)

## 2015-02-26 LAB — LACTIC ACID, PLASMA: LACTIC ACID, VENOUS: 0.8 mmol/L (ref 0.5–2.0)

## 2015-02-26 LAB — CBG MONITORING, ED: GLUCOSE-CAPILLARY: 304 mg/dL — AB (ref 65–99)

## 2015-02-26 LAB — I-STAT TROPONIN, ED: Troponin i, poc: 0.11 ng/mL (ref 0.00–0.08)

## 2015-02-26 LAB — CORTISOL-AM, BLOOD: CORTISOL - AM: 12.4 ug/dL (ref 6.7–22.6)

## 2015-02-26 LAB — PROCALCITONIN: Procalcitonin: 0.14 ng/mL

## 2015-02-26 LAB — MRSA PCR SCREENING: MRSA BY PCR: POSITIVE — AB

## 2015-02-26 MED ORDER — SODIUM CHLORIDE 0.9 % IV SOLN
INTRAVENOUS | Status: DC
  Administered 2015-02-26 – 2015-02-27 (×3): via INTRAVENOUS

## 2015-02-26 MED ORDER — SENNOSIDES-DOCUSATE SODIUM 8.6-50 MG PO TABS
1.0000 | ORAL_TABLET | Freq: Every day | ORAL | Status: DC
Start: 2015-02-26 — End: 2015-03-06
  Administered 2015-02-26 – 2015-03-06 (×7): 1 via ORAL
  Filled 2015-02-26 (×11): qty 1

## 2015-02-26 MED ORDER — ALLOPURINOL 100 MG PO TABS
100.0000 mg | ORAL_TABLET | Freq: Every day | ORAL | Status: DC
  Administered 2015-02-26 – 2015-03-06 (×7): 100 mg via ORAL
  Filled 2015-02-26 (×12): qty 1

## 2015-02-26 MED ORDER — ONDANSETRON HCL 4 MG PO TABS: 4.0000 mg | ORAL_TABLET | Freq: Four times a day (QID) | ORAL | Status: DC | PRN

## 2015-02-26 MED ORDER — NEOMYCIN-POLYMYXIN-PRAMOXINE 1 % EX CREA
TOPICAL_CREAM | Freq: Two times a day (BID) | CUTANEOUS | Status: DC
  Filled 2015-02-26: qty 28

## 2015-02-26 MED ORDER — VANCOMYCIN HCL IN DEXTROSE 1-5 GM/200ML-% IV SOLN
1000.0000 mg | Freq: Once | INTRAVENOUS | Status: AC
  Administered 2015-02-26: 1000 mg via INTRAVENOUS
  Filled 2015-02-26: qty 200

## 2015-02-26 MED ORDER — INSULIN ASPART 100 UNIT/ML ~~LOC~~ SOLN
0.0000 [IU] | Freq: Every day | SUBCUTANEOUS | Status: DC
  Administered 2015-02-26: 5 [IU] via SUBCUTANEOUS
  Administered 2015-02-27: 2 [IU] via SUBCUTANEOUS

## 2015-02-26 MED ORDER — CRANBERRY 425 MG PO CAPS: 425.0000 mg | ORAL_CAPSULE | Freq: Every day | ORAL | Status: DC

## 2015-02-26 MED ORDER — CHLORHEXIDINE GLUCONATE CLOTH 2 % EX PADS: 6.0000 | MEDICATED_PAD | Freq: Every day | CUTANEOUS | Status: DC

## 2015-02-26 MED ORDER — GABAPENTIN 300 MG PO CAPS
300.0000 mg | ORAL_CAPSULE | Freq: Three times a day (TID) | ORAL | Status: DC
  Administered 2015-02-26 – 2015-02-27 (×4): 300 mg via ORAL
  Filled 2015-02-26 (×4): qty 1

## 2015-02-26 MED ORDER — CITALOPRAM HYDROBROMIDE 20 MG PO TABS
20.0000 mg | ORAL_TABLET | Freq: Every day | ORAL | Status: DC
  Administered 2015-02-26 – 2015-03-06 (×7): 20 mg via ORAL
  Filled 2015-02-26 (×12): qty 1

## 2015-02-26 MED ORDER — BACLOFEN 20 MG PO TABS
20.0000 mg | ORAL_TABLET | Freq: Three times a day (TID) | ORAL | Status: DC
  Administered 2015-02-26 – 2015-02-28 (×9): 20 mg via ORAL
  Filled 2015-02-26 (×13): qty 1

## 2015-02-26 MED ORDER — ISOSORBIDE DINITRATE 10 MG PO TABS
10.0000 mg | ORAL_TABLET | Freq: Two times a day (BID) | ORAL | Status: DC
  Administered 2015-02-26 – 2015-03-06 (×15): 10 mg via ORAL
  Filled 2015-02-26 (×19): qty 1

## 2015-02-26 MED ORDER — SODIUM CHLORIDE 0.9 % IJ SOLN
3.0000 mL | Freq: Two times a day (BID) | INTRAMUSCULAR | Status: DC
  Administered 2015-02-26 – 2015-03-05 (×6): 3 mL via INTRAVENOUS

## 2015-02-26 MED ORDER — IPRATROPIUM-ALBUTEROL 0.5-2.5 (3) MG/3ML IN SOLN: 3.0000 mL | Freq: Four times a day (QID) | RESPIRATORY_TRACT | Status: DC | PRN

## 2015-02-26 MED ORDER — TACROLIMUS 0.5 MG PO CAPS: 0.5000 mg | ORAL_CAPSULE | Freq: Two times a day (BID) | ORAL | Status: DC

## 2015-02-26 MED ORDER — ATORVASTATIN CALCIUM 10 MG PO TABS
10.0000 mg | ORAL_TABLET | Freq: Every day | ORAL | Status: DC
  Administered 2015-02-26 – 2015-03-05 (×8): 10 mg via ORAL
  Filled 2015-02-26 (×9): qty 1

## 2015-02-26 MED ORDER — INSULIN GLARGINE 100 UNIT/ML ~~LOC~~ SOLN
10.0000 [IU] | Freq: Every day | SUBCUTANEOUS | Status: DC
  Administered 2015-02-26: 10 [IU] via SUBCUTANEOUS
  Filled 2015-02-26 (×2): qty 0.1

## 2015-02-26 MED ORDER — SODIUM CHLORIDE 0.9 % IV BOLUS (SEPSIS)
500.0000 mL | INTRAVENOUS | Status: AC
  Administered 2015-02-26: 500 mL via INTRAVENOUS

## 2015-02-26 MED ORDER — LEVOFLOXACIN IN D5W 750 MG/150ML IV SOLN
750.0000 mg | Freq: Once | INTRAVENOUS | Status: AC
  Administered 2015-02-26: 750 mg via INTRAVENOUS
  Filled 2015-02-26: qty 150

## 2015-02-26 MED ORDER — MUPIROCIN 2 % EX OINT
1.0000 "application " | TOPICAL_OINTMENT | Freq: Two times a day (BID) | CUTANEOUS | Status: DC
  Administered 2015-02-26 – 2015-03-06 (×16): 1 via NASAL
  Filled 2015-02-26 (×4): qty 22

## 2015-02-26 MED ORDER — VANCOMYCIN HCL 10 G IV SOLR
1250.0000 mg | INTRAVENOUS | Status: DC
  Administered 2015-02-27 – 2015-02-28 (×2): 1250 mg via INTRAVENOUS
  Filled 2015-02-26 (×2): qty 1250

## 2015-02-26 MED ORDER — PREDNISONE 5 MG PO TABS
5.0000 mg | ORAL_TABLET | Freq: Every day | ORAL | Status: DC
  Administered 2015-02-28 – 2015-03-06 (×6): 5 mg via ORAL
  Filled 2015-02-26 (×12): qty 1

## 2015-02-26 MED ORDER — HYDROCORTISONE NA SUCCINATE PF 100 MG IJ SOLR
50.0000 mg | Freq: Once | INTRAMUSCULAR | Status: AC
  Administered 2015-02-26: 50 mg via INTRAVENOUS
  Filled 2015-02-26: qty 1

## 2015-02-26 MED ORDER — ASPIRIN 81 MG PO CHEW
81.0000 mg | CHEWABLE_TABLET | Freq: Every day | ORAL | Status: DC
  Administered 2015-02-26 – 2015-03-06 (×7): 81 mg via ORAL
  Filled 2015-02-26 (×9): qty 1

## 2015-02-26 MED ORDER — ONDANSETRON HCL 4 MG/2ML IJ SOLN
4.0000 mg | Freq: Four times a day (QID) | INTRAMUSCULAR | Status: DC | PRN
  Administered 2015-03-01 – 2015-03-02 (×3): 4 mg via INTRAVENOUS
  Filled 2015-02-26 (×2): qty 2

## 2015-02-26 MED ORDER — SODIUM CHLORIDE 0.9 % IV BOLUS (SEPSIS)
1000.0000 mL | INTRAVENOUS | Status: AC
  Administered 2015-02-26 (×2): 1000 mL via INTRAVENOUS

## 2015-02-26 MED ORDER — TACROLIMUS 1 MG PO CAPS: 3.0000 mg | ORAL_CAPSULE | Freq: Two times a day (BID) | ORAL | Status: DC

## 2015-02-26 MED ORDER — BACITRACIN-NEOMYCIN-POLYMYXIN OINTMENT TUBE
TOPICAL_OINTMENT | Freq: Two times a day (BID) | CUTANEOUS | Status: DC
  Administered 2015-02-26 – 2015-02-27 (×3): via TOPICAL
  Administered 2015-02-27: 1 via TOPICAL
  Administered 2015-02-28 – 2015-03-06 (×13): via TOPICAL
  Filled 2015-02-26 (×2): qty 15

## 2015-02-26 MED ORDER — LORAZEPAM 2 MG/ML IJ SOLN
1.0000 mg | Freq: Four times a day (QID) | INTRAMUSCULAR | Status: DC | PRN
  Administered 2015-02-27 (×3): 1 mg via INTRAVENOUS
  Filled 2015-02-26 (×4): qty 1

## 2015-02-26 MED ORDER — PANTOPRAZOLE SODIUM 40 MG PO TBEC
40.0000 mg | DELAYED_RELEASE_TABLET | Freq: Every day | ORAL | Status: DC
  Administered 2015-02-26 – 2015-02-28 (×3): 40 mg via ORAL
  Filled 2015-02-26 (×3): qty 1

## 2015-02-26 MED ORDER — DEXTROSE 5 % IV SOLN
2.0000 g | Freq: Once | INTRAVENOUS | Status: AC
  Administered 2015-02-26: 2 g via INTRAVENOUS
  Filled 2015-02-26: qty 2

## 2015-02-26 MED ORDER — FENTANYL 25 MCG/HR TD PT72
25.0000 ug | MEDICATED_PATCH | TRANSDERMAL | Status: DC
  Administered 2015-02-26 – 2015-03-01 (×2): 25 ug via TRANSDERMAL
  Filled 2015-02-26 (×2): qty 1

## 2015-02-26 MED ORDER — HYDRALAZINE HCL 20 MG/ML IJ SOLN
5.0000 mg | INTRAMUSCULAR | Status: DC | PRN
  Administered 2015-02-28: 5 mg via INTRAVENOUS
  Filled 2015-02-26: qty 1

## 2015-02-26 MED ORDER — SODIUM CHLORIDE 0.9 % IV SOLN
500.0000 mg | Freq: Three times a day (TID) | INTRAVENOUS | Status: DC
  Administered 2015-02-26 – 2015-02-28 (×6): 500 mg via INTRAVENOUS
  Filled 2015-02-26 (×9): qty 500

## 2015-02-26 MED ORDER — INSULIN ASPART 100 UNIT/ML ~~LOC~~ SOLN
0.0000 [IU] | Freq: Three times a day (TID) | SUBCUTANEOUS | Status: DC
  Administered 2015-02-26 (×2): 5 [IU] via SUBCUTANEOUS
  Administered 2015-02-26: 3 [IU] via SUBCUTANEOUS
  Administered 2015-02-27: 2 [IU] via SUBCUTANEOUS
  Administered 2015-02-27 – 2015-03-01 (×5): 3 [IU] via SUBCUTANEOUS

## 2015-02-26 MED ORDER — SODIUM CHLORIDE 0.9 % IV SOLN
INTRAVENOUS | Status: DC
  Administered 2015-02-26: 04:00:00 via INTRAVENOUS

## 2015-02-26 MED ORDER — ACETAMINOPHEN 650 MG RE SUPP
650.0000 mg | Freq: Four times a day (QID) | RECTAL | Status: DC | PRN
  Filled 2015-02-26: qty 1

## 2015-02-26 MED ORDER — TIZANIDINE HCL 2 MG PO TABS
2.0000 mg | ORAL_TABLET | Freq: Two times a day (BID) | ORAL | Status: DC
  Administered 2015-02-26 – 2015-03-01 (×7): 2 mg via ORAL
  Filled 2015-02-26 (×8): qty 1

## 2015-02-26 MED ORDER — SODIUM CHLORIDE 0.9 % IV BOLUS (SEPSIS)
1000.0000 mL | Freq: Once | INTRAVENOUS | Status: AC
  Administered 2015-02-26: 1000 mL via INTRAVENOUS

## 2015-02-26 MED ORDER — RESOURCE THICKENUP CLEAR PO POWD
ORAL | Status: DC | PRN
  Filled 2015-02-26: qty 125

## 2015-02-26 MED ORDER — TACROLIMUS 1 MG PO CAPS
3.5000 mg | ORAL_CAPSULE | Freq: Two times a day (BID) | ORAL | Status: DC
  Administered 2015-02-26 – 2015-02-28 (×6): 3.5 mg via ORAL
  Filled 2015-02-26 (×8): qty 1

## 2015-02-26 MED ORDER — SODIUM CHLORIDE 0.9 % IV SOLN
250.0000 mg | Freq: Four times a day (QID) | INTRAVENOUS | Status: DC
  Administered 2015-02-26: 250 mg via INTRAVENOUS
  Filled 2015-02-26 (×3): qty 250

## 2015-02-26 MED ORDER — VANCOMYCIN HCL IN DEXTROSE 750-5 MG/150ML-% IV SOLN
750.0000 mg | Freq: Two times a day (BID) | INTRAVENOUS | Status: DC
  Filled 2015-02-26: qty 150

## 2015-02-26 MED ORDER — HEPARIN SODIUM (PORCINE) 5000 UNIT/ML IJ SOLN
5000.0000 [IU] | Freq: Three times a day (TID) | INTRAMUSCULAR | Status: DC
  Administered 2015-02-26 – 2015-03-04 (×17): 5000 [IU] via SUBCUTANEOUS
  Filled 2015-02-26 (×28): qty 1

## 2015-02-26 MED ORDER — ACETAMINOPHEN 325 MG PO TABS
650.0000 mg | ORAL_TABLET | Freq: Four times a day (QID) | ORAL | Status: DC | PRN
  Administered 2015-03-01 – 2015-03-05 (×3): 650 mg via ORAL
  Filled 2015-02-26 (×6): qty 2

## 2015-02-26 MED ORDER — ASPIRIN 81 MG PO TABS: 81.0000 mg | ORAL_TABLET | Freq: Every day | ORAL | Status: DC

## 2015-02-26 MED ORDER — LEVETIRACETAM 500 MG PO TABS
500.0000 mg | ORAL_TABLET | Freq: Two times a day (BID) | ORAL | Status: DC
  Administered 2015-02-26 – 2015-02-28 (×6): 500 mg via ORAL
  Filled 2015-02-26 (×10): qty 1

## 2015-02-26 NOTE — ED Provider Notes (Signed)
CSN: [id number]     Arrival date & time 02/25/15  2350 History  This chart was scribed for Julianne Rice, MD by Meriel Pica, ED Scribe. This patient was seen in room A10C/A10C and the patient's care was started 12:22 AM.   Chief Complaint  Patient presents with  . Fever   LEVEL 5 CAVEAT: PT NON-VERBAL   The history is provided by the EMS personnel and the nursing home. The history is limited by the condition of the patient. No language interpreter was used.   HPI Comments: Justin [name] is a 58 y.o. male, with a PMhx of CVA and renal transplant, brought in by ambulance, who presents to the Emergency Department from a nursing home with fever and w Tmax of 103F onset today. The pt was given 650mg  of Tylenol at the nursing facility but continued with a fever throughout the evening. EMS call out due to family wishes concerning continued fever. Current rectal temp of 101.11F. Pt is non-verbal.   Past Medical History  Diagnosis Date  . Hypertension   . Dyslipidemia   . Carpal tunnel syndrome   . A-V fistula   . Anemia   . Hemorrhoids   . Hyperparathyroidism   . Intracranial hemorrhage   . Hypotension   . Hyperkalemia   . Kidney disease   . Sepsis(995.91)   . GI bleeding   . DVT (deep venous thrombosis)   . PVD (peripheral vascular disease)   . DM (diabetes mellitus)   . Cellulitis   . IV infiltration     Right upper extremity  . PPD positive   . 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 Hypokinesis     EF of 60% with mild anterior hypokinesis, minimal coronary artery disease on catheterization in December of 2007   Past Surgical History  Procedure Laterality Date  . Kidney transplant      History of renal transplant maintained on chronic immunosuppressive therapy  . Av fistula placement      Right arm  . Peripherally inserted central catheter insertion      line placement  . Amputation    . Gastrostomy    . 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 Brain biopsy    . #6 shiley cuff      Less trache   Family History  Problem Relation Age of Onset  . Heart disease Mother    Social History  Substance Use Topics  . Smoking status: Former Smoker    Quit date: 01/06/2001  . Smokeless tobacco: None  . Alcohol Use: No    Review of Systems  Unable to perform ROS Constitutional: Positive for fever.   Allergies  Amoxicillin; Ampicillin; Latex; Morphine and related; Penicillins; and Tape  Home Medications   Prior to Admission medications   Medication Sig Start Date End Date Taking? Authorizing Provider  allopurinol (ZYLOPRIM) 100 MG tablet Take 100 mg by mouth daily.      Historical Provider, MD  aspirin 81 MG tablet Take 81 mg by mouth daily.      Historical Provider, MD  atorvastatin (LIPITOR) 10 MG tablet Take 10 mg by mouth daily.    Historical Provider, MD  baclofen (LIORESAL) 20 MG tablet Take 20 mg by mouth 3 (three) times daily.    Historical Provider, MD  Chlorhexidine Gluconate Cloth 2 % PADS Apply 6 each topically daily at 6 (six) AM. 10/21/14  Florencia Reasons, MD  citalopram (CELEXA) 10 MG tablet Take 20 mg by mouth daily.     Historical Provider, MD  Cranberry 425 MG CAPS Take 425 mg by mouth daily.    Historical Provider, MD  Dextrose-Sodium Chloride (DEXTROSE 5 % AND 0.45% NACL) 5-0.45 % Inject 50 mLs into the vein 2 (two) times daily. 80ml/hr 0900 and 1800    Historical Provider, MD  doxycycline (VIBRAMYCIN) 100 MG capsule Take 1 capsule (100 mg total) by mouth 2 (two) times daily. 10/21/14   Florencia Reasons, MD  fentaNYL (DURAGESIC - DOSED MCG/HR) 25 MCG/HR patch Place 25 mcg onto the skin every 3 (three) days.    Historical Provider, MD  gabapentin (NEURONTIN) 300 MG capsule Take 300 mg by mouth 3 (three) times daily.  05/20/13   Historical Provider, MD  hydrALAZINE (APRESOLINE) 25 MG tablet Take 25 mg by mouth 2 (two) times daily.    Historical Provider, MD  insulin aspart (NOVOLOG) 100 UNIT/ML  injection Inject 9 Units into the skin 3 (three) times daily before meals.     Historical Provider, MD  insulin glargine (LANTUS) 100 UNIT/ML injection Inject 0.1 mLs (10 Units total) into the skin at bedtime. 10/21/14   Florencia Reasons, MD  ipratropium-albuterol (DUONEB) 0.5-2.5 (3) MG/3ML SOLN Take 3 mLs by nebulization every 6 (six) hours as needed (wheezing).    Historical Provider, MD  isosorbide dinitrate (ISORDIL) 10 MG tablet Take 10 mg by mouth 2 (two) times daily.    Historical Provider, MD  levETIRAcetam (KEPPRA) 500 MG tablet Take 500 mg by mouth every 12 (twelve) hours.      Historical Provider, MD  LORazepam (ATIVAN) 0.5 MG tablet Take 0.5 mg by mouth every 8 (eight) hours as needed. Anxiety    Historical Provider, MD  LORazepam (ATIVAN) 2 MG/ML injection Inject 1 mg into the vein every 6 (six) hours as needed for anxiety.    Historical Provider, MD  mupirocin ointment (BACTROBAN) 2 % Place 1 application into the nose 2 (two) times daily. 10/21/14   Florencia Reasons, MD  omeprazole (PRILOSEC) 20 MG capsule Take 20 mg by mouth daily.      Historical Provider, MD  Potassium & Sodium Phosphates (PHOS-NAK PO) Take 1 Package by mouth 3 (three) times daily.    Historical Provider, MD  predniSONE (DELTASONE) 5 MG tablet Take 5 mg by mouth daily.     Historical Provider, MD  senna-docusate (SENOKOT-S) 8.6-50 MG per tablet Take 1 tablet by mouth daily.    Historical Provider, MD  tacrolimus (PROGRAF) 0.5 MG capsule Take 0.5 mg by mouth 2 (two) times daily.    Historical Provider, MD  tacrolimus (PROGRAF) 1 MG capsule Take 3 mg by mouth 2 (two) times daily.    Historical Provider, MD  tiZANidine (ZANAFLEX) 2 MG tablet Take 2 mg by mouth 2 (two) times daily.  05/26/13   Historical Provider, MD  Vitamin D, Ergocalciferol, (DRISDOL) 50000 UNITS CAPS Take 50,000 Units by mouth every 30 (thirty) days.    Historical Provider, MD   BP 123/30 mmHg  Pulse 89  Temp(Src) 99.8 F (37.7 C) (Oral)  Resp 20  Ht 6\' 1"  (1.854  m)  Wt 181 lb (82.101 kg)  BMI 23.89 kg/m2  SpO2 95% Physical Exam  Constitutional: He appears well-developed and well-nourished. No distress.  HENT:  Head: Normocephalic and atraumatic.  Dry mucous membranes  Eyes: EOM are normal. Pupils are equal, round, and reactive to light.  Neck: Normal range of  motion. Neck supple.  No meningismus  Cardiovascular: Normal rate and regular rhythm.   Pulmonary/Chest: Effort normal and breath sounds normal. No respiratory distress. He has no wheezes. He has no rales.  Scattered coarse breath sounds bilateral bases  Abdominal: Soft. Bowel sounds are normal. He exhibits no distension and no mass. There is no tenderness. There is no rebound and no guarding.  Genitourinary:  Patient with fungating mass to the base of the penis. Foul odor.   Musculoskeletal: Normal range of motion. He exhibits no edema or tenderness.  2 superficial ulcerations to the left knee. There is no evidence of infection.  Neurological:  Patient is lethargic. Not following commands.  Skin: Skin is warm and dry. No rash noted. No erythema.  Nursing note and vitals reviewed.   ED Course  Procedures  DIAGNOSTIC STUDIES: Oxygen Saturation is 95% on RA, adequate by my interpretation.    Labs Review Labs Reviewed  COMPREHENSIVE METABOLIC PANEL - Abnormal; Notable for the following:    Glucose, Bld 349 (*)    BUN 34 (*)    Creatinine, Ser 2.34 (*)    Albumin 2.4 (*)    GFR calc non Af Amer 28 (*)    GFR calc Af Amer 33 (*)    All other components within normal limits  CBC WITH DIFFERENTIAL/PLATELET - Abnormal; Notable for the following:    MCHC 29.9 (*)    Platelets 135 (*)    All other components within normal limits  URINALYSIS, ROUTINE W REFLEX MICROSCOPIC (NOT AT [facility]) - Abnormal; Notable for the following:    APPearance CLOUDY (*)    Glucose, UA >1000 (*)    Hgb urine dipstick SMALL (*)    Protein, ur 30 (*)    Nitrite POSITIVE (*)    Leukocytes, UA LARGE (*)     All other components within normal limits  URINE MICROSCOPIC-ADD ON - Abnormal; Notable for the following:    Bacteria, UA MANY (*)    All other components within normal limits  I-STAT CG4 LACTIC ACID, ED - Abnormal; Notable for the following:    Lactic Acid, Venous 2.04 (*)    All other components within normal limits  CBG MONITORING, ED - Abnormal; Notable for the following:    Glucose-Capillary 304 (*)    All other components within normal limits  CULTURE, BLOOD (ROUTINE X 2)  CULTURE, BLOOD (ROUTINE X 2)  URINE CULTURE  I-STAT CG4 LACTIC ACID, ED  Randolm Idol, ED    Imaging Review Dg Chest Port 1 View  02/26/2015   CLINICAL DATA:  Fever.  EXAM: PORTABLE CHEST - 1 VIEW  COMPARISON:  10/18/2014  FINDINGS: Shallow inspiration with slight elevation of the left hemidiaphragm. Infiltration or atelectasis in the left lung base. No blunting of costophrenic angles. No pneumothorax. Normal heart size and pulmonary vascularity. Appearance is similar to prior study.  IMPRESSION: Shallow inspiration with infiltration or atelectasis in the left lung base.   Electronically Signed   By: Lucienne Capers M.D.   On: 02/26/2015 01:24   I have personally reviewed and evaluated these images and lab results as part of my medical decision-making.   EKG Interpretation   Date/Time:  Thursday February 26 2015 00:03:18 EDT Ventricular Rate:  87 PR Interval:  137 QRS Duration: 114 QT Interval:  380 QTC Calculation: 457 R Axis:   -37 Text Interpretation:  Sinus rhythm Abnormal R-wave progression, late  transition LVH with IVCD, LAD and secondary repol abnrm Confirmed by  Lita Mains  MD, Deyjah Kindel ([id number]) on 02/26/2015 3:32:25 AM     Patient has some T-wave inversions that appear similar to previous EKG. MDM   Final diagnoses:  UTI (lower urinary tract infection)  Elevated lactic acid level    I personally performed the services described in this documentation, which was scribed in my presence. The  recorded information has been reviewed and is accurate.  Mild elevation of lactic acid level. Evidence of UTI on UA. Questionable pneumonia. Sepsis protocol initiated. Vital signs stable in the emergency department. Will admit to hospitalist.   Julianne Rice, MD 02/26/15 [phone]

## 2015-02-26 NOTE — Progress Notes (Signed)
Utilization review completed.  

## 2015-02-26 NOTE — Evaluation (Signed)
Physical Therapy Evaluation Patient Details Name: Justin [name] MRN: [id number] DOB: 14-Jun-1956 Today's Date: 02/26/2015   History of Present Illness  Pt is a 59 y.o. male with PMH of kidney transplantation, CKD-III, hypertension, hyperlipidemia, diabetes mellitus, GERD, gout, depression, ICH with right-sided paralysis, nonverbal, GI bleeding, PVD, who presents with fever.  Clinical Impression  Pt admitted with above diagnosis. Pt currently with functional limitations due to the deficits listed below (see PT Problem List). At the time of PT eval pt was able to perform minimal AROM in bed. R hemiplegia from a previous stroke noted. Therapist positioned RUE with roll in hand and elevated on pillow. Pt was able to follow minimal commands. Unsure of his PLOF but it is likely he was needing extensive assist PTA. Pt will benefit from skilled PT to increase their independence and safety with mobility to allow discharge to the venue listed below.       Follow Up Recommendations SNF;Supervision/Assistance - 24 hour    Equipment Recommendations  None recommended by PT    Recommendations for Other Services       Precautions / Restrictions Precautions Precautions: Fall Precaution Comments: Pt has open sores around genitals - per chart likely herpes  Restrictions Weight Bearing Restrictions: No      Mobility  Bed Mobility                  Transfers                    Ambulation/Gait                Stairs            Wheelchair Mobility    Modified Rankin (Stroke Patients Only)       Balance                                             Pertinent Vitals/Pain Pain Assessment: Faces Faces Pain Scale: Hurts whole lot Pain Location: hip/pelvic area Pain Intervention(s): Limited activity within patient's tolerance    Home Living Family/patient expects to be discharged to:: Skilled nursing facility                      Prior  Function Level of Independence: Needs assistance         Comments: Pt mostly non-verbal and is not able to provide PLOF. Per chart pt is not able to participate in ADL's at SNF.      Hand Dominance   Dominant Hand: Left    Extremity/Trunk Assessment   Upper Extremity Assessment: RUE deficits/detail RUE Deficits / Details: Flaccid from previous stroke         Lower Extremity Assessment: RLE deficits/detail;Generalized weakness RLE Deficits / Details: Able to slightly move ankles when asked. Otherwise grossly flaccid       Communication   Communication: Expressive difficulties  Cognition Arousal/Alertness: Awake/alert Behavior During Therapy: Flat affect Overall Cognitive Status: History of cognitive impairments - at baseline                      General Comments General comments (skin integrity, edema, etc.): AROM assessed in all 4 extremities. Pt limited in R vs. L. Was able to perform SLR on LLE. Limited AROM on LUE    Exercises  Assessment/Plan    PT Assessment Patient needs continued PT services  PT Diagnosis Generalized weakness;Difficulty walking   PT Problem List Decreased strength;Decreased range of motion;Decreased activity tolerance;Decreased balance;Decreased mobility;Decreased knowledge of use of DME;Decreased safety awareness;Decreased knowledge of precautions;Pain  PT Treatment Interventions DME instruction;Gait training;Stair training;Functional mobility training;Therapeutic activities;Therapeutic exercise;Neuromuscular re-education;Patient/family education   PT Goals (Current goals can be found in the Care Plan section) Acute Rehab PT Goals PT Goal Formulation: Patient unable to participate in goal setting Time For Goal Achievement: 03/12/15 Potential to Achieve Goals: Fair    Frequency Min 2X/week   Barriers to discharge        Co-evaluation               End of Session   Activity Tolerance: Patient limited by  pain Patient left: in bed;with call bell/phone within reach Nurse Communication: Mobility status;Need for lift equipment         Time: 4332-9518 PT Time Calculation (min) (ACUTE ONLY): 16 min   Charges:   PT Evaluation $Initial PT Evaluation Tier I: 1 Procedure     PT G Codes:        Rolinda Roan 2015/03/28, 3:15 PM   Rolinda Roan, PT, DPT Acute Rehabilitation Services Pager: [phone]

## 2015-02-26 NOTE — Progress Notes (Addendum)
Pt seen and examined, admitted this am per Dr.Niu 59 y.o. male with PMH of kidney transplantation, CKD-III, hypertension, hyperlipidemia, diabetes mellitus, GERD, gout, depression, ICH with right-sided paralysis, Aphasia, presented with fever. In ED, patient was found to have positive urinalysis, lactate 2.04, temperature 101.6, no tachycardia, worsening renal function, WBC 5.3. X-ray showed shallow inspiration with infiltration or atelectasis in the left lung base. Sepsis, likely due to UTI Continue current Abx, FU Blood and Urine Cx  Domenic Polite, MD [phone]

## 2015-02-26 NOTE — Consult Note (Addendum)
WOC consult requested for "warts on penis." There are multiple red raised vesicles, some are dry crusted and yellow. Appearance is consistent with probable genital herpes; this is beyond [facility] scope of practice. Please consult ID for further plan of care if desired.   Consult requested for left leg and right elbow.  Right elbow with chronic full thickness wound, .5X.5X.1cm, red and moist, no odor or drainage. Skin to bilat legs with dark bruises and multiple scabs. Left knee area with 2 partial thickness abrasions; 2X1X.1cm and .5X.5X.1cm.  Both are red and moist, no odor or drainage.  Plan:  Foam dressing to protect and promote healing to left leg and right elbow. Please re-consult if further assistance is needed.  Thank-you,  Julien Girt MSN, [place], [place], [place], [place]

## 2015-02-26 NOTE — Evaluation (Signed)
Clinical/Bedside Swallow Evaluation Patient Details  Name: Justin [name] MRN: [id number] Date of Birth: [birth year]  Today's Date: 02/26/2015 Time: SLP Start Time (ACUTE ONLY): 1545 SLP Stop Time (ACUTE ONLY): 1600 SLP Time Calculation (min) (ACUTE ONLY): 15 min  Past Medical History:  Past Medical History  Diagnosis Date  . Hypertension   . Dyslipidemia   . Carpal tunnel syndrome   . A-V fistula   . Anemia   . Hemorrhoids   . Hyperparathyroidism   . Intracranial hemorrhage   . Hypotension   . Hyperkalemia   . Kidney disease   . Sepsis(995.91)   . GI bleeding   . DVT (deep venous thrombosis)   . PVD (peripheral vascular disease)   . DM (diabetes mellitus)   . Cellulitis   . IV infiltration     Right upper extremity  . PPD positive   . 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 Hypokinesis     EF of 60% with mild anterior hypokinesis, minimal coronary artery disease on catheterization in December of 2007  . Gout    Past Surgical History:  Past Surgical History  Procedure Laterality Date  . Kidney transplant      History of renal transplant maintained on chronic immunosuppressive therapy  . Av fistula placement      Right arm  . Peripherally inserted central catheter insertion      line placement  . Amputation    . Gastrostomy    . 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 Brain biopsy    . #6 shiley cuff      Less trache   HPI:  Justin [name] is a 59 y.o. male, SNF resident, with PMH of kidney transplantation, CKD-III, hypertension, hyperlipidemia, diabetes mellitus, GERD, gout, depression, ICH with right-sided paralysis, nonverbal, GI bleeding, PVD, who presents with fever.  CXR showed shallow inspiration with infiltration or atelectasis in the left lung base. MBS on 4/19 recommends Dys 1/nectar due to aspiration of thin liquids with delayed sensation.    Assessment / Plan / Recommendation Clinical Impression  Pt presents with impaired right facial weakness and symmetry and mild oropharyngeal dysphagia consistant with previous MBS (4/16).  Pt has suspected penetration as evidenced by immediate cough with thin liquids. Pt. self fed nectar thick and puree and did not show any signs or symptoms of aspiration. Pt required assistance grasping spoon to self feed and needs food open and ready to be eaten. Previous modified diet was nectar thick and puree, and it is recommended to be continued. SLP will f/u with diet tolerance and check with pt's family and facility about his recent diet plan.     Aspiration Risk  Mild    Diet Recommendation Dysphagia 1 (Puree);Nectar   Medication Administration: Crushed with puree    Other  Recommendations Oral Care Recommendations: Oral care BID   Follow Up Recommendations       Frequency and Duration min 2x/week  1 week   Pertinent Vitals/Pain NA    SLP Swallow Goals     Swallow Study Prior Functional Status       General Other Pertinent Information: SILVERIO HAGAN is a 59 y.o. male, SNF resident, with PMH of kidney transplantation, CKD-III, hypertension, hyperlipidemia, diabetes mellitus, GERD, gout, depression, ICH with right-sided paralysis, nonverbal, GI bleeding, PVD, who presents with fever.  CXR showed shallow inspiration with infiltration or atelectasis in the left  lung base. MBS on 4/19 recommends Dys 1/nectar due to aspiration of thin liquids with delayed sensation.  Type of Study: Bedside swallow evaluation Diet Prior to this Study: Nectar-thick liquids;Dysphagia 1 (puree) Temperature Spikes Noted: No Respiratory Status: Room air Behavior/Cognition: Alert;Cooperative;Requires cueing Oral Cavity - Dentition: Missing dentition Self-Feeding Abilities: Needs set up;Able to feed self Patient Positioning: Upright in bed Baseline Vocal Quality: Normal Volitional Cough: Strong    Oral/Motor/Sensory Function Overall Oral Motor/Sensory Function:  Impaired Labial ROM: Reduced right Labial Symmetry: Abnormal symmetry right Labial Strength: Within Functional Limits Labial Sensation: Reduced Lingual ROM: Within Functional Limits Facial ROM: Reduced right Facial Symmetry: Right droop Facial Strength: Within Functional Limits Facial Sensation: Reduced   Ice Chips Ice chips: Within functional limits Presentation: Spoon   Thin Liquid Thin Liquid: Impaired Presentation: Cup Oral Phase Impairments: Reduced labial seal Oral Phase Functional Implications: Right anterior spillage Pharyngeal  Phase Impairments: Suspected delayed Swallow;Cough - Immediate    Nectar Thick Nectar Thick Liquid: Within functional limits Presentation: Cup   Honey Thick Honey Thick Liquid: Not tested   Puree Puree: Within functional limits Presentation: Self Fed;Spoon (needs assistance grabbing spoon)   Solid   GO    Solid: Not tested      Lanier Ensign, Student-SLP  Lanier Ensign 02/26/2015,4:45 PM

## 2015-02-26 NOTE — Progress Notes (Signed)
ANTIBIOTIC CONSULT NOTE - INITIAL  Pharmacy Consult for Primaxin and Vancomycin Indication: HCAP and UTI  Allergies  Allergen Reactions  . Amoxicillin Other (See Comments)    ON MAR  . Ampicillin Other (See Comments)    ON MAR  . Latex Other (See Comments)    ON MAR  . Morphine And Related Other (See Comments)    UNK  . Penicillins Other (See Comments)    ON MAR  . Tape Other (See Comments)    ON MAR    Patient Measurements: Height: 6\' 1"  (185.4 cm) Weight: 181 lb (82.101 kg) IBW/kg (Calculated) : 79.9  Vital Signs: Temp: 101.6 F (38.7 C) (08/25 0017) Temp Source: Rectal (08/25 0017) BP: 131/43 mmHg (08/25 0330) Pulse Rate: 73 (08/25 0330) Intake/Output from previous day: 08/24 0701 - 08/25 0700 In: 2500 [I.V.:2500] Out: 100 [Urine:100] Intake/Output from this shift: Total I/O In: 2500 [I.V.:2500] Out: 100 [Urine:100]  Labs:  Recent Labs  02/26/15 0025  WBC 5.3  HGB 14.3  PLT 135*  CREATININE 2.34*   Estimated Creatinine Clearance: 37.5 mL/min (by C-G formula based on Cr of 2.34). No results for input(s): VANCOTROUGH, VANCOPEAK, VANCORANDOM, GENTTROUGH, GENTPEAK, GENTRANDOM, TOBRATROUGH, TOBRAPEAK, TOBRARND, AMIKACINPEAK, AMIKACINTROU, AMIKACIN in the last 72 hours.   Microbiology: No results found for this or any previous visit (from the past 720 hour(s)).  Medical History: Past Medical History  Diagnosis Date  . Hypertension   . Dyslipidemia   . Carpal tunnel syndrome   . A-V fistula   . Anemia   . Hemorrhoids   . Hyperparathyroidism   . Intracranial hemorrhage   . Hypotension   . Hyperkalemia   . Kidney disease   . Sepsis(995.91)   . GI bleeding   . DVT (deep venous thrombosis)   . PVD (peripheral vascular disease)   . DM (diabetes mellitus)   . Cellulitis   . IV infiltration     Right upper extremity  . PPD positive   . 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 Hypokinesis     EF of 60%  with mild anterior hypokinesis, minimal coronary artery disease on catheterization in December of 2007    Medications:  See electronic med rec  Assessment: 59 y.o. Justin [name] presents from NH with fever (Tm 103). Pt received Vanc 1gm, Aztreonam 2gm, and Levaquin 750 mg in ED ~0235. To start Primaxin and continue Vancomycin for HCAP and UTI. WBC wnl. LA 2.04. Tc 101.6. SCr 2.34, est Crcl ~30 ml/min. Evidence of UTI noted on UA.  Goal of Therapy:  Vancomycin trough level 15-20 mcg/ml  Plan:  Primaxin 250mg  IV q6h Vancomycin 1250mg  Iv q24h Will f/u renal function, micro data, and pt's clinical condition Vanc trough prn  Sherlon Handing, PharmD, BCPS Clinical pharmacist, pager [phone] 02/26/2015,4:00 AM

## 2015-02-26 NOTE — H&P (Addendum)
[facility] History and Physical  Justin [name] KGY:[id number] DOB: [birth year] DOA: 02/25/2015  Referring physician: ED physician PCP: Garwin Brothers, MD  Specialists:   Chief Complaint: fever  HPI: Justin [name] is a 59 y.o. male with PMH of kidney transplantation, CKD-III, hypertension, hyperlipidemia, diabetes mellitus, GERD, gout, depression, ICH with right-sided paralysis, nonverbal, GI bleeding, PVD, who presents with fever.  Patient is nonverbal, and is unable to provide any history, therefore, most of the history is obtained by discussing the case with the ED physician and the nursing staff and EMS. It seems that pt was found to have fever by SNF staff with Tmax of 103F today. The pt was given 650 mg of Tylenol at the nursing facility, but he continued with a fever throughout the evening. EMS call out due to family's concerning of continued fever. Patient does not seem to have cough, not sure whether patient has pain anywhere.  In ED, patient was found to have positive urinalysis, lactate 2.04, temperature 101.6, no tachycardia, worsening renal function, WBC 5.3. X-ray showed shallow inspiration with infiltration or atelectasis in the left lung base.  Where does patient live?  SNF    Can patient participate in ADLs?   None   Review of Systems: Could not be obtained due to nonverbal  Allergy:  Allergies  Allergen Reactions  . Amoxicillin Other (See Comments)    ON MAR  . Ampicillin Other (See Comments)    ON MAR  . Latex Other (See Comments)    ON MAR  . Morphine And Related Other (See Comments)    UNK  . Penicillins Other (See Comments)    ON MAR  . Tape Other (See Comments)    ON MAR    Past Medical History  Diagnosis Date  . Hypertension   . Dyslipidemia   . Carpal tunnel syndrome   . A-V fistula   . Anemia   . Hemorrhoids   . Hyperparathyroidism   . Intracranial hemorrhage   . Hypotension   . Hyperkalemia   . Kidney disease   . Sepsis(995.91)   .  GI bleeding   . DVT (deep venous thrombosis)   . PVD (peripheral vascular disease)   . DM (diabetes mellitus)   . Cellulitis   . IV infiltration     Right upper extremity  . PPD positive   . 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 Hypokinesis     EF of 60% with mild anterior hypokinesis, minimal coronary artery disease on catheterization in December of 2007  . Gout     Past Surgical History  Procedure Laterality Date  . Kidney transplant      History of renal transplant maintained on chronic immunosuppressive therapy  . Av fistula placement      Right arm  . Peripherally inserted central catheter insertion      line placement  . Amputation    . Gastrostomy    . 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 Brain biopsy    . #6 shiley cuff      Less trache    Social History:  reports that he quit smoking about 14 years ago. He does not have any smokeless tobacco history on file. He reports that he uses illicit drugs. He reports that he does not drink alcohol.  Family History:  Family History  Problem Relation Age of Onset  . Heart disease  Mother      Prior to Admission medications   Medication Sig Start Date End Date Taking? Authorizing Provider  allopurinol (ZYLOPRIM) 100 MG tablet Take 100 mg by mouth daily.      Historical Provider, MD  aspirin 81 MG tablet Take 81 mg by mouth daily.      Historical Provider, MD  atorvastatin (LIPITOR) 10 MG tablet Take 10 mg by mouth daily.    Historical Provider, MD  baclofen (LIORESAL) 20 MG tablet Take 20 mg by mouth 3 (three) times daily.    Historical Provider, MD  Chlorhexidine Gluconate Cloth 2 % PADS Apply 6 each topically daily at 6 (six) AM. 10/21/14   Florencia Reasons, MD  citalopram (CELEXA) 10 MG tablet Take 20 mg by mouth daily.     Historical Provider, MD  Cranberry 425 MG CAPS Take 425 mg by mouth daily.    Historical Provider, MD  Dextrose-Sodium Chloride (DEXTROSE 5 % AND 0.45%  NACL) 5-0.45 % Inject 50 mLs into the vein 2 (two) times daily. 70ml/hr 0900 and 1800    Historical Provider, MD  doxycycline (VIBRAMYCIN) 100 MG capsule Take 1 capsule (100 mg total) by mouth 2 (two) times daily. 10/21/14   Florencia Reasons, MD  fentaNYL (DURAGESIC - DOSED MCG/HR) 25 MCG/HR patch Place 25 mcg onto the skin every 3 (three) days.    Historical Provider, MD  gabapentin (NEURONTIN) 300 MG capsule Take 300 mg by mouth 3 (three) times daily.  05/20/13   Historical Provider, MD  hydrALAZINE (APRESOLINE) 25 MG tablet Take 25 mg by mouth 2 (two) times daily.    Historical Provider, MD  insulin aspart (NOVOLOG) 100 UNIT/ML injection Inject 9 Units into the skin 3 (three) times daily before meals.     Historical Provider, MD  insulin glargine (LANTUS) 100 UNIT/ML injection Inject 0.1 mLs (10 Units total) into the skin at bedtime. 10/21/14   Florencia Reasons, MD  ipratropium-albuterol (DUONEB) 0.5-2.5 (3) MG/3ML SOLN Take 3 mLs by nebulization every 6 (six) hours as needed (wheezing).    Historical Provider, MD  isosorbide dinitrate (ISORDIL) 10 MG tablet Take 10 mg by mouth 2 (two) times daily.    Historical Provider, MD  levETIRAcetam (KEPPRA) 500 MG tablet Take 500 mg by mouth every 12 (twelve) hours.      Historical Provider, MD  LORazepam (ATIVAN) 0.5 MG tablet Take 0.5 mg by mouth every 8 (eight) hours as needed. Anxiety    Historical Provider, MD  LORazepam (ATIVAN) 2 MG/ML injection Inject 1 mg into the vein every 6 (six) hours as needed for anxiety.    Historical Provider, MD  mupirocin ointment (BACTROBAN) 2 % Place 1 application into the nose 2 (two) times daily. 10/21/14   Florencia Reasons, MD  omeprazole (PRILOSEC) 20 MG capsule Take 20 mg by mouth daily.      Historical Provider, MD  Potassium & Sodium Phosphates (PHOS-NAK PO) Take 1 Package by mouth 3 (three) times daily.    Historical Provider, MD  predniSONE (DELTASONE) 5 MG tablet Take 5 mg by mouth daily.     Historical Provider, MD  senna-docusate  (SENOKOT-S) 8.6-50 MG per tablet Take 1 tablet by mouth daily.    Historical Provider, MD  tacrolimus (PROGRAF) 0.5 MG capsule Take 0.5 mg by mouth 2 (two) times daily.    Historical Provider, MD  tacrolimus (PROGRAF) 1 MG capsule Take 3 mg by mouth 2 (two) times daily.    Historical Provider, MD  tiZANidine (ZANAFLEX) 2  MG tablet Take 2 mg by mouth 2 (two) times daily.  05/26/13   Historical Provider, MD  Vitamin D, Ergocalciferol, (DRISDOL) 50000 UNITS CAPS Take 50,000 Units by mouth every 30 (thirty) days.    Historical Provider, MD    Physical Exam: Filed Vitals:   02/26/15 0215 02/26/15 0230 02/26/15 0300 02/26/15 0330  BP: 108/26 113/27 123/27 131/43  Pulse: 71 71 69 73  Temp:      TempSrc:      Resp: 15 14 14 15   Height:      Weight:      SpO2: 100% 100% 100% 99%   General: Not in acute distress HEENT:       Eyes: PERRL, no scleral icterus.       ENT: No discharge from the ears and nose, no pharynx injection, no tonsillar enlargement.        Neck: No JVD, no bruit, no mass felt. Heme: No neck lymph node enlargement. Cardiac: S1/S2, RRR, No murmurs, No gallops or rubs. Pulm: No rales, wheezing, rhonchi or rubs. Abd: Soft, nondistended, nontender, no rebound pain, no organomegaly, BS present. Ext: No pitting leg edema bilaterally. 2+DP/PT pulse bilaterally. Musculoskeletal: No joint deformities, No joint redness or warmth, no limitation of ROM in spin. Skin: No rashes.  Neuro: Alert, not oriented X3, cranial nerves II-XII grossly intact, right sided hemiparalysis, moves left extremities. Psych: Cannot be assessed due to nonverbal.  Labs on Admission:  Basic Metabolic Panel:  Recent Labs Lab 02/26/15 0025  NA 145  K 4.9  CL 109  CO2 28  GLUCOSE 349*  BUN 34*  CREATININE 2.34*  CALCIUM 9.9   Liver Function Tests:  Recent Labs Lab 02/26/15 0025  AST 32  ALT 22  ALKPHOS 90  BILITOT 0.4  PROT 7.2  ALBUMIN 2.4*   No results for input(s): LIPASE, AMYLASE in  the last 168 hours. No results for input(s): AMMONIA in the last 168 hours. CBC:  Recent Labs Lab 02/26/15 0025  WBC 5.3  NEUTROABS 3.7  HGB 14.3  HCT 47.9  MCV 91.6  PLT 135*   Cardiac Enzymes: No results for input(s): CKTOTAL, CKMB, CKMBINDEX, TROPONINI in the last 168 hours.  BNP (last 3 results) No results for input(s): BNP in the last 8760 hours.  ProBNP (last 3 results) No results for input(s): PROBNP in the last 8760 hours.  CBG:  Recent Labs Lab 02/26/15 0011  GLUCAP 304*    Radiological Exams on Admission: Dg Chest Port 1 View  02/26/2015   CLINICAL DATA:  Fever.  EXAM: PORTABLE CHEST - 1 VIEW  COMPARISON:  10/18/2014  FINDINGS: Shallow inspiration with slight elevation of the left hemidiaphragm. Infiltration or atelectasis in the left lung base. No blunting of costophrenic angles. No pneumothorax. Normal heart size and pulmonary vascularity. Appearance is similar to prior study.  IMPRESSION: Shallow inspiration with infiltration or atelectasis in the left lung base.   Electronically Signed   By: Lucienne Capers M.D.   On: 02/26/2015 01:24    EKG: Independently reviewed.  Abnormal findings: T-wave inversion laterally which is worse than previous EKG on 10/18/14, LAD.   Assessment/Plan Principal Problem:   Sepsis Active Problems:   Diabetes mellitus   Hyperlipidemia   Essential hypertension   Intracranial hemorrhage   Peripheral vascular disease   Renal transplant, status post   UTI (lower urinary tract infection)   Acute renal failure superimposed on stage 3 chronic kidney disease   Fever   Gout  GERD (gastroesophageal reflux disease)   Depression  Sepsis: Patient is septic on admission with fever and elevated lactate. He is hemodynamically stable. The etiology is most likely due to UTI given positive urinalysis. Chest x-ray showed possible infiltration, indicating possible HCAP vs. Aspiration. Given immunosuppressed status, he needs antibiotics with  broad coverage.  - will admit to tele bed - ED started IV vancomycin, azithromycin and Levaquin, will switch to vancomycin plus Primaxin. - will get Procalcitonin and trend lactic acid levels per sepsis protocol. - IVF: 2.5L of NS bolus in ED, followed by 100 cc/h - DuoNeb Neb prn for SOB - Urine legionella and S. pneumococcal antigen - Follow up blood culture x2, sputum culture and urine culture - NPO and get SLP  DM-II: Last A1c 7.9 on 10/20/14, not well controled. Patient is taking Lantus at home. 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 No acute issues. -Continue blood pressure as above -On Lipitor  Renal transplant, status post: On prednisone and tacrolimus at home -Continue home prednisone and tacrolimus -Solu Cortef 50 mg 1 for stress dose -Check cortisol level  AoCKD-III: Baseline Cre is 1.4-1.7, his Cre is 2.34 and BUN 34 on admission. Likely due to prerenal secondary to dehydration and UTI. - IVF as above - Check FeNa - US-renal - Follow up renal function by BMP  Abnomal EKG: Has worsening T-wave inversion in lateral leads. Not sure whether patient has chest pain. -trop x 3 -on ASA, lipitor and Isordil  GERD: -Protonix  Gout: stable. -Continue allopurinol  Depression:  -Continue home medications: Celexa  DVT ppx: SQ Heparin   Code Status: Full code Family Communication: None at bed side.  Disposition Plan: Admit to inpatient   Date of Service 02/26/2015    Ivor Costa [facility] Pager [phone]  If 7PM-7AM, please contact night-coverage [email or web address] Password [email or web address] 02/26/2015, 4:05 AM

## 2015-02-27 ENCOUNTER — Inpatient Hospital Stay (HOSPITAL_COMMUNITY): Payer: Medicare Other

## 2015-02-27 LAB — BASIC METABOLIC PANEL
ANION GAP: 3 — AB (ref 5–15)
BUN: 19 mg/dL (ref 6–20)
CO2: 19 mmol/L — AB (ref 22–32)
Calcium: 6.4 mg/dL — CL (ref 8.9–10.3)
Chloride: 125 mmol/L — ABNORMAL HIGH (ref 101–111)
Creatinine, Ser: 1.17 mg/dL (ref 0.61–1.24)
GFR calc Af Amer: >60 mL/min (ref >60)
GFR calc non Af Amer: >60 mL/min (ref >60)
GLUCOSE: 189 mg/dL — AB (ref 65–99)
POTASSIUM: 3 mmol/L — AB (ref 3.5–5.1)
Sodium: 147 mmol/L — ABNORMAL HIGH (ref 135–145)

## 2015-02-27 LAB — CBC
HEMATOCRIT: 34.5 % — AB (ref 39.0–52.0)
Hemoglobin: 10.2 g/dL — ABNORMAL LOW (ref 13.0–17.0)
MCH: 27.3 pg (ref 26.0–34.0)
MCHC: 29.6 g/dL — ABNORMAL LOW (ref 30.0–36.0)
MCV: 92.5 fL (ref 78.0–100.0)
Platelets: 92 10*3/uL — ABNORMAL LOW (ref 150–400)
RBC: 3.73 MIL/uL — AB (ref 4.22–5.81)
RDW: 14.7 % (ref 11.5–15.5)
WBC: 4.5 10*3/uL (ref 4.0–10.5)

## 2015-02-27 LAB — GLUCOSE, CAPILLARY
Glucose-Capillary: 218 mg/dL — ABNORMAL HIGH (ref 65–99)
Glucose-Capillary: 181 mg/dL — ABNORMAL HIGH (ref 65–99)
Glucose-Capillary: 225 mg/dL — ABNORMAL HIGH (ref 65–99)
Glucose-Capillary: 220 mg/dL — ABNORMAL HIGH (ref 65–99)

## 2015-02-27 LAB — STREP PNEUMONIAE URINARY ANTIGEN: STREP PNEUMO URINARY ANTIGEN: NEGATIVE

## 2015-02-27 LAB — CREATININE, URINE, RANDOM: CREATININE, URINE: 57.47 mg/dL

## 2015-02-27 LAB — SODIUM, URINE, RANDOM: SODIUM UR: 117 mmol/L

## 2015-02-27 MED ORDER — SODIUM CHLORIDE 0.9 % IV SOLN: 1.0000 mg/kg/h | INTRAVENOUS | Status: DC

## 2015-02-27 MED ORDER — ADULT MULTIVITAMIN W/MINERALS CH
1.0000 | ORAL_TABLET | Freq: Every day | ORAL | Status: DC
  Administered 2015-02-28 – 2015-03-06 (×5): 1 via ORAL
  Filled 2015-02-27 (×11): qty 1

## 2015-02-27 MED ORDER — PRO-STAT SUGAR FREE PO LIQD
30.0000 mL | Freq: Two times a day (BID) | ORAL | Status: DC
  Administered 2015-02-27 – 2015-03-02 (×5): 30 mL via ORAL
  Filled 2015-02-27 (×11): qty 30

## 2015-02-27 MED ORDER — SODIUM CHLORIDE 0.9 % IV SOLN
1.0000 g | Freq: Once | INTRAVENOUS | Status: AC
Start: 2015-02-27 — End: 2015-02-27
  Administered 2015-02-27: 1 g via INTRAVENOUS
  Filled 2015-02-27: qty 10

## 2015-02-27 MED ORDER — INSULIN GLARGINE 100 UNIT/ML ~~LOC~~ SOLN
15.0000 [IU] | Freq: Every day | SUBCUTANEOUS | Status: DC
  Administered 2015-02-27 – 2015-03-05 (×7): 15 [IU] via SUBCUTANEOUS
  Filled 2015-02-27 (×8): qty 0.15

## 2015-02-27 MED ORDER — GABAPENTIN 100 MG PO CAPS
100.0000 mg | ORAL_CAPSULE | Freq: Three times a day (TID) | ORAL | Status: DC
  Administered 2015-02-27 – 2015-03-06 (×18): 100 mg via ORAL
  Filled 2015-02-27 (×31): qty 1

## 2015-02-27 NOTE — Progress Notes (Addendum)
Initial Nutrition Assessment  DOCUMENTATION CODES:   Not applicable  INTERVENTION:   -Magic Cup TID with meals -30 ml Prostat BID -MVI daily  NUTRITION DIAGNOSIS:   Increased nutrient needs related to wound healing as evidenced by estimated needs.  GOAL:   Patient will meet greater than or equal to 90% of their needs  MONITOR:   PO intake, Supplement acceptance, Labs, Weight trends, Skin, I & O's  REASON FOR ASSESSMENT:   Low Braden    ASSESSMENT:   Justin [name] is a 59 y.o. male with PMH of kidney transplantation, CKD-III, hypertension, hyperlipidemia, diabetes mellitus, GERD, gout, depression, ICH with right-sided paralysis, nonverbal, GI bleeding, PVD, who presents with fever.  Pt admitted from SNF with sepsis.   Pt lying in bed at time of visit; did not respond to RD's questions.   Reviewed COWRN on 02/26/15. 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   SLP completed BSE on 02/26/15; pt with hx of mild dysphagia. He is currently on a dysphagia 1 diet with nectar thick liquids. Poor intake per meal documentation records; PO: 10-50%.   Reviewed wt hx. Noted wt loss trend within the past 8-9 months, including a 45# (20.9%) wt loss since 06/2014.   Nutrition-Focused physical exam completed. Findings are mild to moderate fat depletion, mild to moderate muscle depletion, and mild edema. It is difficult to determine in fat and muscle wasting is consistent with subtoptimal nutritional status or decreased mobility.  Pt is at high nutritional risk due to poor po intake, dysphagia, hx of weight loss, and multiple wounds. RD will add supplements to optimize nutritional status.  Labs reviewed: CBGS: 218-340 (trending down). Pt is on both basal and sliding scale insulin. Pt refused prednisone this AM per MAR.   Diet Order:  DIET - DYS 1 Room service appropriate?: Yes; Fluid consistency:: Nectar Thick  Skin:  Wound  (see comment) (full thickness rt elbow, open lt leg, st II lt sacrum)  Last BM:  02/27/15  Height:   Ht Readings from Last 1 Encounters:  02/26/15 6\' 1"  (1.854 m)    Weight:   Wt Readings from Last 1 Encounters:  02/26/15 170 lb 1.6 oz (77.157 kg)    Ideal Body Weight:  83.6 kg  BMI:  Body mass index is 22.45 kg/(m^2).  Estimated Nutritional Needs:   Kcal:  5462-7035  Protein:  105-115 grams  Fluid:  >2.3 L  EDUCATION NEEDS:   No education needs identified at this time  Rmoni Keplinger A. Jimmye Norman, RD, LDN, CDE Pager: [phone] After hours Pager: [phone]

## 2015-02-27 NOTE — Progress Notes (Signed)
[facility] PROGRESS NOTE  [name] AYT:[id number] DOB: January 06, 1956 DOA: 02/25/2015 PCP: Garwin Brothers, MD  Assessment/Plan: Sepsis -likely due to UTI, ? Infiltrate on CXR too -improving, lactic acid better -cut down iVF, continue broad spectrum abx, pending culture results  S/p kidney transplantation -creatinine improved, continue prednisone, prograf  AKI on CKD-III  -improved, cut down IVF  Hypertension -stable, continue isordil  Diabetes mellitus -continue lantus, SSI -Last A1c 7.9 on 10/20/14  ICH with right-sided paralysis, Aphasia -continue home regimen of baclofen, fentanyl, neurontin, ativan PRN, keppra -on D1 diet s/p SLP eval  GERD -PPI  DVT proph: HEp SQ  Code Status: Full Code Family Communication: none at bedside Disposition Plan: SNF when stable  HPI/Subjective: received ativan earlier this am for agitation  Objective: Filed Vitals:   02/27/15 0542  BP: 126/71  Pulse: 79  Temp: 97.8 F (36.6 C)  Resp: 18    Intake/Output Summary (Last 24 hours) at 02/27/15 1300 Last data filed at 02/27/15 0900  Gross per 24 hour  Intake   1700 ml  Output      0 ml  Net   1700 ml   Filed Weights   02/26/15 0010 02/26/15 0511  Weight: 82.101 kg (181 lb) 77.157 kg (170 lb 1.6 oz)    Exam:   General:  Sleepy, arousible, received ativan earlier this am  Cardiovascular: S1S2/RRR  Respiratory: diminihsed bs at bases  Abdomen: soft, NT, BS present  Musculoskeletal: no edema   GU: skin breakdown of penile skin with warts  R hemiplegia, aphasic   Data Reviewed: Basic Metabolic Panel:  Recent Labs Lab 02/26/15 0025 02/26/15 0810 02/27/15 1017  NA 145 144 147*  K 4.9 4.4 3.0*  CL 109 110 125*  CO2 28 29 19*  GLUCOSE 349* 321* 189*  BUN 34* 28* 19  CREATININE 2.34* 1.85* 1.17  CALCIUM 9.9 8.8* 6.4*   Liver Function Tests:  Recent Labs Lab 02/26/15 0025  AST 32  ALT 22  ALKPHOS 90  BILITOT 0.4  PROT 7.2  ALBUMIN 2.4*    No results for input(s): LIPASE, AMYLASE in the last 168 hours. No results for input(s): AMMONIA in the last 168 hours. CBC:  Recent Labs Lab 02/26/15 0025 02/26/15 0810 02/27/15 1017  WBC 5.3 4.8 4.5  NEUTROABS 3.7  --   --   HGB 14.3 13.0 10.2*  HCT 47.9 44.2 34.5*  MCV 91.6 91.5 92.5  PLT 135* 94* 92*   Cardiac Enzymes:  Recent Labs Lab 02/26/15 0810 02/26/15 1604 02/26/15 2025  TROPONINI 0.08* 0.03 0.05*   BNP (last 3 results) No results for input(s): BNP in the last 8760 hours.  ProBNP (last 3 results) No results for input(s): PROBNP in the last 8760 hours.  CBG:  Recent Labs Lab 02/26/15 1131 02/26/15 1620 02/26/15 2156 02/27/15 0612 02/27/15 1148  GLUCAP 255* 221* 390* 218* 181*    Recent Results (from the past 240 hour(s))  Urine culture     Status: None (Preliminary result)   Collection Time: 02/26/15  1:05 AM  Result Value Ref Range Status   Specimen Description URINE, CATHETERIZED  Final   Special Requests NONE  Final   Culture >=100,000 COLONIES/mL ESCHERICHIA COLI  Final   Report Status PENDING  Incomplete  MRSA PCR Screening     Status: Abnormal   Collection Time: 02/26/15 11:33 AM  Result Value Ref Range Status   MRSA by PCR POSITIVE (A) NEGATIVE Final    Comment:  The GeneXpert MRSA Assay (FDA approved for NASAL specimens only), is one component of a comprehensive MRSA colonization surveillance program. It is not intended to diagnose MRSA infection nor to guide or monitor treatment for MRSA infections. RESULT CALLED TO, READ BACK BY AND VERIFIED WITH: KUFFAUR RN 13:15 02/26/15 ([email or web address])      Studies: US Renal  02/27/2015   CLINICAL DATA:  Acute renal insufficiency.  EXAM: RENAL / URINARY TRACT ULTRASOUND COMPLETE  COMPARISON:  None.  FINDINGS: Right Kidney:  Length: 9.5 cm. New echogenicity and thickness of the right renal cortex is within normal limits without mass or cyst. There is right-sided pelviectasis, perhaps  mild hydronephrosis. No renal stones seen.  Left Kidney:  Left kidney is not visualized within its expected location or within the left lower quadrant/pelvis.  Bladder:  Appears normal for degree of bladder distention.  IMPRESSION: Right-sided pelviectasis, perhaps mild hydronephrosis. Right kidney otherwise unremarkable.  Left kidney not seen which may indicate solitary right kidney. Could consider CT or nuclear medicine renal scan to evaluate for location of a possible second kidney.   Electronically Signed   By: Franki Cabot M.D.   On: 02/27/2015 06:55   Dg Chest Port 1 View  02/26/2015   CLINICAL DATA:  Fever.  EXAM: PORTABLE CHEST - 1 VIEW  COMPARISON:  10/18/2014  FINDINGS: Shallow inspiration with slight elevation of the left hemidiaphragm. Infiltration or atelectasis in the left lung base. No blunting of costophrenic angles. No pneumothorax. Normal heart size and pulmonary vascularity. Appearance is similar to prior study.  IMPRESSION: Shallow inspiration with infiltration or atelectasis in the left lung base.   Electronically Signed   By: Lucienne Capers M.D.   On: 02/26/2015 01:24    Scheduled Meds: . allopurinol  100 mg Oral Daily  . aspirin  81 mg Oral Daily  . atorvastatin  10 mg Oral q1800  . baclofen  20 mg Oral TID  . calcium gluconate  1 g Intravenous Once  . citalopram  20 mg Oral Daily  . feeding supplement (PRO-STAT SUGAR FREE 64)  30 mL Oral BID  . fentaNYL  25 mcg Transdermal Q72H  . gabapentin  100 mg Oral TID  . heparin  5,000 Units Subcutaneous 3 times per day  . imipenem-cilastatin  500 mg Intravenous 3 times per day  . insulin aspart  0-5 Units Subcutaneous QHS  . insulin aspart  0-9 Units Subcutaneous TID WC  . insulin glargine  15 Units Subcutaneous QHS  . isosorbide dinitrate  10 mg Oral BID  . levETIRAcetam  500 mg Oral Q12H  . multivitamin with minerals  1 tablet Oral Daily  . mupirocin ointment  1 application Nasal BID  . neomycin-bacitracin-polymyxin    Topical BID  . pantoprazole  40 mg Oral Daily  . predniSONE  5 mg Oral QAC breakfast  . senna-docusate  1 tablet Oral Daily  . sodium chloride  3 mL Intravenous Q12H  . tacrolimus  3.5 mg Oral BID  . tiZANidine  2 mg Oral BID  . vancomycin  1,250 mg Intravenous Q24H   Continuous Infusions:  Antibiotics Given (last 72 hours)    Date/Time Action Medication Dose Rate   02/26/15 0724 Given   imipenem-cilastatin (PRIMAXIN) 250 mg in sodium chloride 0.9 % 100 mL IVPB 250 mg 200 mL/hr   02/26/15 1418 Given   imipenem-cilastatin (PRIMAXIN) 500 mg in sodium chloride 0.9 % 100 mL IVPB 500 mg 200 mL/hr   02/26/15 2304 Given  imipenem-cilastatin (PRIMAXIN) 500 mg in sodium chloride 0.9 % 100 mL IVPB 500 mg 200 mL/hr   02/27/15 0451 Given   vancomycin (VANCOCIN) 1,250 mg in sodium chloride 0.9 % 250 mL IVPB 1,250 mg 166.7 mL/hr   02/27/15 0211 Given   imipenem-cilastatin (PRIMAXIN) 500 mg in sodium chloride 0.9 % 100 mL IVPB 500 mg 200 mL/hr      Principal Problem:   Sepsis Active Problems:   Diabetes mellitus   Hyperlipidemia   Essential hypertension   Intracranial hemorrhage   Peripheral vascular disease   Renal transplant, status post   UTI (lower urinary tract infection)   Acute renal failure superimposed on stage 3 chronic kidney disease   Fever   Gout   GERD (gastroesophageal reflux disease)   Depression    Time spent: 23min    Justin [name]  [facility] Pager [phone]. If 7PM-7AM, please contact night-coverage at [email or web address], password [email or web address] 02/27/2015, 1:00 PM  LOS: 1 day

## 2015-02-27 NOTE — Clinical Social Work Note (Signed)
Clinical Social Work Assessment  Patient Details  Name: Justin [name] MRN: [id number] Date of Birth: 11-23-1955  Date of referral:  02/27/15               Reason for consult:  Facility Placement                Permission sought to share information with:    Permission granted to share information::  No  Name::     [place]::  [facility]  Relationship::  daughter  Contact Information:     Housing/Transportation Living arrangements for the past 2 months:  [place] of Information:  Adult Children Patient Interpreter Needed:  None Criminal Activity/Legal Involvement Pertinent to Current Situation/Hospitalization:  No - Comment as needed Significant Relationships:  Adult Children Lives with:  Facility Resident Do you feel safe going back to the place where you live?  Yes Need for family participation in patient care:  Yes (Comment)  Care giving concerns:  None pt is facility resident- been at [facility] for several years   Facilities manager / plan:  CSW spoke with pt daughter concerning pt return to SNF at time of DC  Employment status:  Retired Nurse, adult PT Recommendations:  Not assessed at this time Information / Referral to community resources:  [place]  Patient/Family's Response to care:  Dtr is agreeable to pt return to [facility]  Patient/Family's Understanding of and Emotional Response to Diagnosis, Current Treatment, and Prognosis:  Pt daugther expressed concern for pt and hopeful for update from physician soon about pt condition  Emotional Assessment Appearance:    Attitude/Demeanor/Rapport:  Unable to Assess Affect (typically observed):  Unable to Assess Orientation:    Alcohol / Substance use:  Not Applicable Psych involvement (Current and /or in the community):  No (Comment)  Discharge Needs  Concerns to be addressed:  No discharge needs identified Readmission within the last  30 days:    Current discharge risk:  None Barriers to Discharge:  Continued Medical Work up   [employer], [place], LCSW 02/27/2015, 2:15 PM

## 2015-02-27 NOTE — Evaluation (Signed)
Occupational Therapy Evaluation Patient Details Name: Justin [name] MRN: [id number] DOB: [birth year] Today's Date: 02/27/2015    History of Present Illness Pt is a 59 y.o. male with PMH of kidney transplantation, CKD-III, hypertension, hyperlipidemia, diabetes mellitus, GERD, gout, depression, ICH with right-sided paralysis, nonverbal, GI bleeding, PVD, who presents with fever.   Clinical Impression   Patient evaluated by Occupational Therapy with no further acute OT needs identified. All education has been completed and the patient has no further questions. Pt is a long term resident of nursing facility and anticipate he is at, or close to his baseline.  See below for any follow-up Occupational Therapy or equipment needs. OT is signing off. Thank you for this referral.      Follow Up Recommendations  SNF;No OT follow up    Equipment Recommendations  None recommended by OT    Recommendations for Other Services       Precautions / Restrictions Precautions Precautions: Fall      Mobility Bed Mobility                  Transfers                      Balance                                            ADL Overall ADL's : Needs assistance/impaired Eating/Feeding: Moderate assistance;Bed level Eating/Feeding Details (indicate cue type and reason): Pt demonstrates impaired initiation and ideomotor apraxia.  He is able to scoopt items onto spoon.  He demonstrates difficulty grasping spoon, but can bring it to mouth, but occasionally overshoots his mouth.  He undershoots and overshoots significantly when reaching for items.   He is able to drink from cup with min A .  Likely pt baseline  Grooming: Wash/dry face;Total assistance;Bed level Grooming Details (indicate cue type and reason): Pt does not attempt to reach for washcloth spontaneously, or on command.  When cloth placed in his hand, he shows no familiarity with it, and lets it all out of his  hand.  When hand over hand assist provided to wash face, he does not attempt to assist.  Upper Body Bathing: Total assistance   Lower Body Bathing: Total assistance   Upper Body Dressing : Total assistance   Lower Body Dressing: Total assistance   Toilet Transfer: Total assistance   Toileting- Clothing Manipulation and Hygiene: Total assistance         General ADL Comments: Pt is likely at his baseline level of functioning      Vision Additional Comments: Pt unable to participate in eval. He is noted to significantly overshoot and undershoot when reaching for items   Perception     Praxis      Pertinent Vitals/Pain Pain Assessment: Faces Faces Pain Scale: No hurt     Hand Dominance Left   Extremity/Trunk Assessment Upper Extremity Assessment Upper Extremity Assessment: RUE deficits/detail RUE Deficits / Details: Pt with Rt hemiplegia with moderate flexor spasticity.  Wrist PROM limited to ~ 5* flex/ext; shoulder PROM 90* flex; fingers WFL passively, but maintains flexed position  RUE Coordination: decreased fine motor;decreased gross motor   Lower Extremity Assessment Lower Extremity Assessment: Defer to PT evaluation       Communication Communication Communication: Expressive difficulties   Cognition Arousal/Alertness: Awake/alert Behavior During Therapy: Flat affect Overall  Cognitive Status: History of cognitive impairments - at baseline                     General Comments       Exercises       Shoulder Instructions      Home Living Family/patient expects to be discharged to:: Skilled nursing facility                                        Prior Functioning/Environment Level of Independence: Needs assistance  Gait / Transfers Assistance Needed: Per chart review, pt has been non ambulatory   ADL's / Homemaking Assistance Needed: requires assist for ADL        OT Diagnosis: Generalized weakness;Cognitive  deficits;Disturbance of vision;Hemiplegia dominant side;Apraxia   OT Problem List: Decreased strength;Decreased range of motion;Decreased activity tolerance;Impaired balance (sitting and/or standing);Impaired vision/perception;Decreased coordination;Decreased safety awareness;Decreased cognition;Decreased knowledge of use of DME or AE;Decreased knowledge of precautions;Impaired tone;Impaired UE functional use   OT Treatment/Interventions:      OT Goals(Current goals can be found in the care plan section) Acute Rehab OT Goals OT Goal Formulation: Patient unable to participate in goal setting  OT Frequency:     Barriers to D/C:            Co-evaluation              End of Session Nurse Communication: Other (comment) (status of eval )  Activity Tolerance: Patient tolerated treatment well Patient left: in bed;with call bell/phone within reach   Time: 1244-1312 OT Time Calculation (min): 28 min Charges:  OT General Charges $OT Visit: 1 Procedure OT Evaluation $Initial OT Evaluation Tier I: 1 Procedure OT Treatments $Self Care/Home Management : 8-22 mins G-Codes:    Daneen Volcy M 10-Mar-2015, 2:26 PM

## 2015-02-27 NOTE — Progress Notes (Signed)
Inpatient Diabetes Program Recommendations  AACE/ADA: New Consensus Statement on Inpatient Glycemic Control (2013)  Target Ranges:  Prepandial:   less than 140 mg/dL      Peak postprandial:   less than 180 mg/dL (1-2 hours)      Critically ill patients:  140 - 180 mg/dL   Inpatient Diabetes Program Recommendations Insulin - Basal: Increase Lantus to 23 units  HgbA1C: order to assess prehospital glucose control  Thank you  Raoul Pitch BSN, RN,CDE Inpatient Diabetes Coordinator [phone] (team pager)

## 2015-02-28 LAB — URINE CULTURE

## 2015-02-28 LAB — BASIC METABOLIC PANEL
ANION GAP: 5 (ref 5–15)
BUN: 25 mg/dL — ABNORMAL HIGH (ref 6–20)
CHLORIDE: 115 mmol/L — AB (ref 101–111)
CO2: 25 mmol/L (ref 22–32)
Calcium: 10 mg/dL (ref 8.9–10.3)
Creatinine, Ser: 1.48 mg/dL — ABNORMAL HIGH (ref 0.61–1.24)
GFR calc Af Amer: 57 mL/min — ABNORMAL LOW (ref >60)
GFR, EST NON AFRICAN AMERICAN: 49 mL/min — AB (ref >60)
GLUCOSE: 146 mg/dL — AB (ref 65–99)
POTASSIUM: 4.9 mmol/L (ref 3.5–5.1)
Sodium: 145 mmol/L (ref 135–145)

## 2015-02-28 LAB — CBC
HEMATOCRIT: 45.5 % (ref 39.0–52.0)
HEMOGLOBIN: 13.4 g/dL (ref 13.0–17.0)
MCH: 26.9 pg (ref 26.0–34.0)
MCHC: 29.5 g/dL — ABNORMAL LOW (ref 30.0–36.0)
MCV: 91.2 fL (ref 78.0–100.0)
PLATELETS: 118 10*3/uL — AB (ref 150–400)
RBC: 4.99 MIL/uL (ref 4.22–5.81)
RDW: 14.5 % (ref 11.5–15.5)
WBC: 4.3 10*3/uL (ref 4.0–10.5)

## 2015-02-28 LAB — GLUCOSE, CAPILLARY
GLUCOSE-CAPILLARY: 226 mg/dL — AB (ref 65–99)
Glucose-Capillary: 111 mg/dL — ABNORMAL HIGH (ref 65–99)
Glucose-Capillary: 218 mg/dL — ABNORMAL HIGH (ref 65–99)
Glucose-Capillary: 197 mg/dL — ABNORMAL HIGH (ref 65–99)

## 2015-02-28 MED ORDER — SODIUM CHLORIDE 0.9 % IV SOLN
INTRAVENOUS | Status: DC
  Administered 2015-03-02 – 2015-03-03 (×2): via INTRAVENOUS

## 2015-02-28 MED ORDER — DEXTROSE 5 % IV SOLN
1.0000 g | INTRAVENOUS | Status: AC
  Administered 2015-02-28 – 2015-03-04 (×5): 1 g via INTRAVENOUS
  Filled 2015-02-28 (×7): qty 10

## 2015-02-28 NOTE — Progress Notes (Signed)
Speech Language Pathology Treatment: Dysphagia  Patient Details Name: Justin [name] MRN: [id number] DOB: 1956-04-24 Today's Date: 02/28/2015 Time: 1140-1150 SLP Time Calculation (min) (ACUTE ONLY): 10 min  Assessment / Plan / Recommendation Clinical Impression  F/u after clinical swallow evaluation - pt continues on dysphagia 1 diet with nectar-thick liquids due to a chronic dysphagia.  Limited participation today - kept eyes closed, but was alert, voicing unintelligibly- accepted several sips of nectar-thick liquids with adequate manipulation, no overt s/s of aspiration, min assist.  No family present.  Pt will likely need to remain on current diet when D/Cd back to facility.  Will follow briefly for toleration and family education.    HPI Other Pertinent Information: Justin [name] is a 59 y.o. male, SNF resident, with PMH of kidney transplantation, CKD-III, hypertension, hyperlipidemia, diabetes mellitus, GERD, gout, depression, ICH with right-sided paralysis, nonverbal, GI bleeding, PVD, who presents with fever.  CXR showed shallow inspiration with infiltration or atelectasis in the left lung base. MBS on 4/19 recommends Dys 1/nectar due to aspiration of thin liquids with delayed sensation.    Pertinent Vitals Pain Assessment: Faces Faces Pain Scale: No hurt  SLP Plan  Continue with current plan of care    Recommendations Diet recommendations: Dysphagia 1 (puree);Nectar-thick liquid Liquids provided via: Cup;Teaspoon Medication Administration: Crushed with puree Supervision: Staff to assist with self feeding Compensations: Slow rate;Small sips/bites Postural Changes and/or Swallow Maneuvers: Seated upright 90 degrees              Oral Care Recommendations: Oral care BID Follow up Recommendations:  (tbd) Plan: Continue with current plan of care    GO     Juan Quam [place] 02/28/2015, 12:01 PM

## 2015-02-28 NOTE — Progress Notes (Signed)
[facility] PROGRESS NOTE  Justin [name] NLZ:[id number] DOB: 1956-05-24 DOA: 02/25/2015 PCP: Garwin Brothers, MD  Assessment/Plan: Sepsis -likely due to [facility] UTI,  -improving, lactic acid better -cut down iVF, urine Cx with EColi, sensitivity pending -stopped Vancomycin,  -PCN allergy reported but tolerated cephalosporins in the past, will switch to Ceftriaxone  S/p kidney transplantation -creatinine improved and then worsened, continue prednisone, prograf -resume IVF  AKI on CKD-III  -improved, stopped IVF  Hypertension -stable, continue isordil  Diabetes mellitus -continue lantus, SSI -Last A1c 7.9 on 10/20/14  ICH with right-sided paralysis, Aphasia -continue home regimen of baclofen, fentanyl, neurontin, ativan PRN, keppra -on D1 diet s/p SLP eval  GERD -PPI  DVT proph: HEp SQ  Code Status: Full Code Family Communication: none at bedside Disposition Plan: SNF Monday  HPI/Subjective: No events overnight,   Objective: Filed Vitals:   02/28/15 0510  BP: 186/79  Pulse: 65  Temp: 97 F (36.1 C)  Resp: 17    Intake/Output Summary (Last 24 hours) at 02/28/15 9024 Last data filed at 02/27/15 1821  Gross per 24 hour  Intake    480 ml  Output      0 ml  Net    480 ml   Filed Weights   02/26/15 0010 02/26/15 0511  Weight: 82.101 kg (181 lb) 77.157 kg (170 lb 1.6 oz)    Exam:   General:  Sleepy, arousible, received ativan earlier this am  Cardiovascular: S1S2/RRR  Respiratory: diminihsed bs at bases  Abdomen: soft, NT, BS present  Musculoskeletal: no edema   GU: skin breakdown of penile skin with warts  R hemiplegia, aphasic   Data Reviewed: Basic Metabolic Panel:  Recent Labs Lab 02/26/15 0025 02/26/15 0810 02/27/15 1017 02/28/15 0302  NA 145 144 147* 145  K 4.9 4.4 3.0* 4.9  CL 109 110 125* 115*  CO2 28 29 19* 25  GLUCOSE 349* 321* 189* 146*  BUN 34* 28* 19 25*  CREATININE 2.34* 1.85* 1.17 1.48*  CALCIUM 9.9 8.8* 6.4* 10.0    Liver Function Tests:  Recent Labs Lab 02/26/15 0025  AST 32  ALT 22  ALKPHOS 90  BILITOT 0.4  PROT 7.2  ALBUMIN 2.4*   No results for input(s): LIPASE, AMYLASE in the last 168 hours. No results for input(s): AMMONIA in the last 168 hours. CBC:  Recent Labs Lab 02/26/15 0025 02/26/15 0810 02/27/15 1017 02/28/15 0302  WBC 5.3 4.8 4.5 4.3  NEUTROABS 3.7  --   --   --   HGB 14.3 13.0 10.2* 13.4  HCT 47.9 44.2 34.5* 45.5  MCV 91.6 91.5 92.5 91.2  PLT 135* 94* 92* 118*   Cardiac Enzymes:  Recent Labs Lab 02/26/15 0810 02/26/15 1604 02/26/15 2025  TROPONINI 0.08* 0.03 0.05*   BNP (last 3 results) No results for input(s): BNP in the last 8760 hours.  ProBNP (last 3 results) No results for input(s): PROBNP in the last 8760 hours.  CBG:  Recent Labs Lab 02/27/15 0612 02/27/15 1148 02/27/15 1759 02/27/15 2102 02/28/15 0603  GLUCAP 218* 181* 225* 220* 111*    Recent Results (from the past 240 hour(s))  Culture, blood (routine x 2)     Status: None (Preliminary result)   Collection Time: 02/26/15 12:25 AM  Result Value Ref Range Status   Specimen Description BLOOD LEFT ARM  Final   Special Requests   Final    BOTTLES DRAWN AEROBIC AND ANAEROBIC 10CCBLUE 3CC RED   Culture NO GROWTH 1 DAY  Final   Report Status PENDING  Incomplete  Culture, blood (routine x 2)     Status: None (Preliminary result)   Collection Time: 02/26/15 12:37 AM  Result Value Ref Range Status   Specimen Description BLOOD LEFT WRIST  Final   Special Requests BOTTLES DRAWN AEROBIC AND ANAEROBIC 10CC  Final   Culture NO GROWTH 1 DAY  Final   Report Status PENDING  Incomplete  Urine culture     Status: None (Preliminary result)   Collection Time: 02/26/15  1:05 AM  Result Value Ref Range Status   Specimen Description URINE, CATHETERIZED  Final   Special Requests NONE  Final   Culture >=100,000 COLONIES/mL ESCHERICHIA COLI  Final   Report Status PENDING  Incomplete  MRSA PCR  Screening     Status: Abnormal   Collection Time: 02/26/15 11:33 AM  Result Value Ref Range Status   MRSA by PCR POSITIVE (A) NEGATIVE Final    Comment:        The GeneXpert MRSA Assay (FDA approved for NASAL specimens only), is one component of a comprehensive MRSA colonization surveillance program. It is not intended to diagnose MRSA infection nor to guide or monitor treatment for MRSA infections. RESULT CALLED TO, READ BACK BY AND VERIFIED WITH: KUFFAUR RN 13:15 02/26/15 ([email or web address])      Studies: US Renal  02/27/2015   CLINICAL DATA:  Acute renal insufficiency.  EXAM: RENAL / URINARY TRACT ULTRASOUND COMPLETE  COMPARISON:  None.  FINDINGS: Right Kidney:  Length: 9.5 cm. New echogenicity and thickness of the right renal cortex is within normal limits without mass or cyst. There is right-sided pelviectasis, perhaps mild hydronephrosis. No renal stones seen.  Left Kidney:  Left kidney is not visualized within its expected location or within the left lower quadrant/pelvis.  Bladder:  Appears normal for degree of bladder distention.  IMPRESSION: Right-sided pelviectasis, perhaps mild hydronephrosis. Right kidney otherwise unremarkable.  Left kidney not seen which may indicate solitary right kidney. Could consider CT or nuclear medicine renal scan to evaluate for location of a possible second kidney.   Electronically Signed   By: Franki Cabot M.D.   On: 02/27/2015 06:55    Scheduled Meds: . allopurinol  100 mg Oral Daily  . aspirin  81 mg Oral Daily  . atorvastatin  10 mg Oral q1800  . baclofen  20 mg Oral TID  . citalopram  20 mg Oral Daily  . feeding supplement (PRO-STAT SUGAR FREE 64)  30 mL Oral BID  . fentaNYL  25 mcg Transdermal Q72H  . gabapentin  100 mg Oral TID  . heparin  5,000 Units Subcutaneous 3 times per day  . imipenem-cilastatin  500 mg Intravenous 3 times per day  . insulin aspart  0-5 Units Subcutaneous QHS  . insulin aspart  0-9 Units Subcutaneous TID WC  . insulin  glargine  15 Units Subcutaneous QHS  . isosorbide dinitrate  10 mg Oral BID  . levETIRAcetam  500 mg Oral Q12H  . multivitamin with minerals  1 tablet Oral Daily  . mupirocin ointment  1 application Nasal BID  . neomycin-bacitracin-polymyxin   Topical BID  . pantoprazole  40 mg Oral Daily  . predniSONE  5 mg Oral QAC breakfast  . senna-docusate  1 tablet Oral Daily  . sodium chloride  3 mL Intravenous Q12H  . tacrolimus  3.5 mg Oral BID  . tiZANidine  2 mg Oral BID   Continuous Infusions:  Antibiotics Given (last 72 hours)  Date/Time Action Medication Dose Rate   02/26/15 0724 Given   imipenem-cilastatin (PRIMAXIN) 250 mg in sodium chloride 0.9 % 100 mL IVPB 250 mg 200 mL/hr   02/26/15 1418 Given   imipenem-cilastatin (PRIMAXIN) 500 mg in sodium chloride 0.9 % 100 mL IVPB 500 mg 200 mL/hr   02/26/15 2304 Given   imipenem-cilastatin (PRIMAXIN) 500 mg in sodium chloride 0.9 % 100 mL IVPB 500 mg 200 mL/hr   02/27/15 0451 Given   vancomycin (VANCOCIN) 1,250 mg in sodium chloride 0.9 % 250 mL IVPB 1,250 mg 166.7 mL/hr   02/27/15 1937 Given   imipenem-cilastatin (PRIMAXIN) 500 mg in sodium chloride 0.9 % 100 mL IVPB 500 mg 200 mL/hr   02/27/15 1649 Given   imipenem-cilastatin (PRIMAXIN) 500 mg in sodium chloride 0.9 % 100 mL IVPB 500 mg 200 mL/hr   02/27/15 2222 Given   imipenem-cilastatin (PRIMAXIN) 500 mg in sodium chloride 0.9 % 100 mL IVPB 500 mg 200 mL/hr   02/28/15 0459 Given   vancomycin (VANCOCIN) 1,250 mg in sodium chloride 0.9 % 250 mL IVPB 1,250 mg 166.7 mL/hr   02/28/15 9024 Given   imipenem-cilastatin (PRIMAXIN) 500 mg in sodium chloride 0.9 % 100 mL IVPB 500 mg 200 mL/hr      Principal Problem:   Sepsis Active Problems:   Diabetes mellitus   Hyperlipidemia   Essential hypertension   Intracranial hemorrhage   Peripheral vascular disease   Renal transplant, status post   UTI (lower urinary tract infection)   Acute renal failure superimposed on stage 3 chronic  kidney disease   Fever   Gout   GERD (gastroesophageal reflux disease)   Depression    Time spent: 26min    Liahna Brickner  [facility] Pager [phone]. If 7PM-7AM, please contact night-coverage at [email or web address], password [facility] 02/28/2015, 9:28 AM  LOS: 2 days

## 2015-03-01 ENCOUNTER — Encounter (HOSPITAL_COMMUNITY): Payer: Self-pay | Admitting: Radiology

## 2015-03-01 ENCOUNTER — Inpatient Hospital Stay (HOSPITAL_COMMUNITY): Payer: Medicare Other

## 2015-03-01 DIAGNOSIS — K219 Gastro-esophageal reflux disease without esophagitis: Secondary | ICD-10-CM

## 2015-03-01 LAB — BASIC METABOLIC PANEL
Anion gap: 4 — ABNORMAL LOW (ref 5–15)
BUN: 24 mg/dL — AB (ref 6–20)
CHLORIDE: 112 mmol/L — AB (ref 101–111)
CO2: 28 mmol/L (ref 22–32)
CREATININE: 1.61 mg/dL — AB (ref 0.61–1.24)
Calcium: 9.9 mg/dL (ref 8.9–10.3)
GFR calc Af Amer: 52 mL/min — ABNORMAL LOW (ref >60)
GFR calc non Af Amer: 45 mL/min — ABNORMAL LOW (ref >60)
Glucose, Bld: 290 mg/dL — ABNORMAL HIGH (ref 65–99)
Potassium: 4.9 mmol/L (ref 3.5–5.1)
Sodium: 144 mmol/L (ref 135–145)

## 2015-03-01 LAB — CBC
HEMATOCRIT: 46.5 % (ref 39.0–52.0)
HEMOGLOBIN: 13.7 g/dL (ref 13.0–17.0)
MCH: 27.1 pg (ref 26.0–34.0)
MCHC: 29.5 g/dL — ABNORMAL LOW (ref 30.0–36.0)
MCV: 91.9 fL (ref 78.0–100.0)
Platelets: 141 10*3/uL — ABNORMAL LOW (ref 150–400)
RBC: 5.06 MIL/uL (ref 4.22–5.81)
RDW: 14.8 % (ref 11.5–15.5)
WBC: 4.3 10*3/uL (ref 4.0–10.5)

## 2015-03-01 LAB — GLUCOSE, CAPILLARY
GLUCOSE-CAPILLARY: 159 mg/dL — AB (ref 65–99)
Glucose-Capillary: 232 mg/dL — ABNORMAL HIGH (ref 65–99)
Glucose-Capillary: 153 mg/dL — ABNORMAL HIGH (ref 65–99)
Glucose-Capillary: 121 mg/dL — ABNORMAL HIGH (ref 65–99)

## 2015-03-01 MED ORDER — LORAZEPAM 2 MG/ML IJ SOLN
0.5000 mg | Freq: Four times a day (QID) | INTRAMUSCULAR | Status: DC | PRN
  Administered 2015-03-05 (×2): 0.5 mg via INTRAVENOUS
  Filled 2015-03-01 (×2): qty 1

## 2015-03-01 MED ORDER — PANTOPRAZOLE SODIUM 40 MG PO PACK
40.0000 mg | PACK | Freq: Every day | ORAL | Status: DC
  Administered 2015-03-02 – 2015-03-06 (×4): 40 mg via ORAL
  Filled 2015-03-01 (×7): qty 20

## 2015-03-01 MED ORDER — LEVETIRACETAM 500 MG/5ML IV SOLN
500.0000 mg | Freq: Two times a day (BID) | INTRAVENOUS | Status: DC
  Administered 2015-03-01 – 2015-03-05 (×7): 500 mg via INTRAVENOUS
  Filled 2015-03-01 (×15): qty 5

## 2015-03-01 MED ORDER — TACROLIMUS 1 MG/ML ORAL SUSPENSION
3.5000 mg | Freq: Two times a day (BID) | ORAL | Status: DC
  Administered 2015-03-01 – 2015-03-06 (×9): 3.5 mg via ORAL
  Filled 2015-03-01 (×18): qty 3.5

## 2015-03-01 MED ORDER — IOHEXOL 300 MG/ML  SOLN: 100.0000 mL | Freq: Once | INTRAMUSCULAR | Status: DC | PRN

## 2015-03-01 MED ORDER — LEVETIRACETAM 100 MG/ML PO SOLN
500.0000 mg | Freq: Two times a day (BID) | ORAL | Status: DC
  Filled 2015-03-01 (×2): qty 5

## 2015-03-01 MED ORDER — BACLOFEN 5 MG HALF TABLET
5.0000 mg | ORAL_TABLET | Freq: Three times a day (TID) | ORAL | Status: DC
  Administered 2015-03-01 – 2015-03-06 (×13): 5 mg via ORAL
  Filled 2015-03-01 (×17): qty 1

## 2015-03-01 NOTE — Progress Notes (Addendum)
[facility] PROGRESS NOTE  Justin [name] MVV:[id number] DOB: February 21, 1956 DOA: 02/25/2015 PCP: Garwin Brothers, MD  Assessment/Plan: Sepsis -likely due to [facility] UTI,  -improving, lactic acid better -cut down iVF, urine Cx with EColi, sensitivity pending -stopped Vancomycin,  -PCN allergy reported but tolerated cephalosporins in the past, will switch to Ceftriaxone  S/p kidney transplantation -creatinine improved and then worsened, continue prednisone, prograf -resume IVF  Encephalopathy -due to infection, underlying neuro disease, meds -cut down baclofen, stop tizanidine -check CT head -may need PEG for nutrition if mentation not improved  AKI on CKD-III  -worsened again, IVF resumed  Hypertension -stable, continue isordil  Diabetes mellitus -continue lantus, SSI -Last A1c 7.9 on 10/20/14  ICH with right-sided paralysis, Aphasia -continue home regimen of baclofen, fentanyl, neurontin, ativan PRN, keppra -on D1 diet s/p SLP eval  GERD -PPI  DVT proph: HEp SQ  Code Status: Full Code Family Communication: none at bedside, left msg for daughter to come to hospital tomorrow Disposition Plan: SNF Monday  HPI/Subjective: No events overnight,   Objective: Filed Vitals:   03/01/15 0505  BP: 184/82  Pulse: 74  Temp: 97.5 F (36.4 C)  Resp: 18    Intake/Output Summary (Last 24 hours) at 03/01/15 1245 Last data filed at 03/01/15 0507  Gross per 24 hour  Intake  342.5 ml  Output   1250 ml  Net -907.5 ml   Filed Weights   02/26/15 0010 02/26/15 0511  Weight: 82.101 kg (181 lb) 77.157 kg (170 lb 1.6 oz)    Exam:   General:  Sleepy, arousible, received ativan earlier this am  Cardiovascular: S1S2/RRR  Respiratory: diminihsed bs at bases  Abdomen: soft, NT, BS present  Musculoskeletal: no edema   GU: skin breakdown of penile skin with warts  R hemiplegia, aphasic   Data Reviewed: Basic Metabolic Panel:  Recent Labs Lab 02/26/15 0025  02/26/15 0810 02/27/15 1017 02/28/15 0302 03/01/15 0426  NA 145 144 147* 145 144  K 4.9 4.4 3.0* 4.9 4.9  CL 109 110 125* 115* 112*  CO2 28 29 19* 25 28  GLUCOSE 349* 321* 189* 146* 290*  BUN 34* 28* 19 25* 24*  CREATININE 2.34* 1.85* 1.17 1.48* 1.61*  CALCIUM 9.9 8.8* 6.4* 10.0 9.9   Liver Function Tests:  Recent Labs Lab 02/26/15 0025  AST 32  ALT 22  ALKPHOS 90  BILITOT 0.4  PROT 7.2  ALBUMIN 2.4*   No results for input(s): LIPASE, AMYLASE in the last 168 hours. No results for input(s): AMMONIA in the last 168 hours. CBC:  Recent Labs Lab 02/26/15 0025 02/26/15 0810 02/27/15 1017 02/28/15 0302 03/01/15 0426  WBC 5.3 4.8 4.5 4.3 4.3  NEUTROABS 3.7  --   --   --   --   HGB 14.3 13.0 10.2* 13.4 13.7  HCT 47.9 44.2 34.5* 45.5 46.5  MCV 91.6 91.5 92.5 91.2 91.9  PLT 135* 94* 92* 118* 141*   Cardiac Enzymes:  Recent Labs Lab 02/26/15 0810 02/26/15 1604 02/26/15 2025  TROPONINI 0.08* 0.03 0.05*   BNP (last 3 results) No results for input(s): BNP in the last 8760 hours.  ProBNP (last 3 results) No results for input(s): PROBNP in the last 8760 hours.  CBG:  Recent Labs Lab 02/28/15 1119 02/28/15 1643 02/28/15 2109 03/01/15 0630 03/01/15 1202  GLUCAP 226* 218* 197* 232* 159*    Recent Results (from the past 240 hour(s))  Culture, blood (routine x 2)     Status: None (Preliminary  result)   Collection Time: 02/26/15 12:25 AM  Result Value Ref Range Status   Specimen Description BLOOD LEFT ARM  Final   Special Requests   Final    BOTTLES DRAWN AEROBIC AND ANAEROBIC 10CCBLUE 3CC RED   Culture NO GROWTH 2 DAYS  Final   Report Status PENDING  Incomplete  Culture, blood (routine x 2)     Status: None (Preliminary result)   Collection Time: 02/26/15 12:37 AM  Result Value Ref Range Status   Specimen Description BLOOD LEFT WRIST  Final   Special Requests BOTTLES DRAWN AEROBIC AND ANAEROBIC 10CC  Final   Culture NO GROWTH 2 DAYS  Final   Report  Status PENDING  Incomplete  Urine culture     Status: None   Collection Time: 02/26/15  1:05 AM  Result Value Ref Range Status   Specimen Description URINE, CATHETERIZED  Final   Special Requests NONE  Final   Culture >=100,000 COLONIES/mL ESCHERICHIA COLI  Final   Report Status 02/28/2015 FINAL  Final   Organism ID, Bacteria ESCHERICHIA COLI  Final      Susceptibility   Escherichia coli - MIC*    AMPICILLIN >=32 RESISTANT Resistant     CEFAZOLIN <=4 SENSITIVE Sensitive     CEFTRIAXONE <=1 SENSITIVE Sensitive     CIPROFLOXACIN >=4 RESISTANT Resistant     GENTAMICIN >=16 RESISTANT Resistant     IMIPENEM <=0.25 SENSITIVE Sensitive     NITROFURANTOIN <=16 SENSITIVE Sensitive     TRIMETH/SULFA <=20 SENSITIVE Sensitive     AMPICILLIN/SULBACTAM 16 INTERMEDIATE Intermediate     PIP/TAZO <=4 SENSITIVE Sensitive     * >=100,000 COLONIES/mL ESCHERICHIA COLI  MRSA PCR Screening     Status: Abnormal   Collection Time: 02/26/15 11:33 AM  Result Value Ref Range Status   MRSA by PCR POSITIVE (A) NEGATIVE Final    Comment:        The GeneXpert MRSA Assay (FDA approved for NASAL specimens only), is one component of a comprehensive MRSA colonization surveillance program. It is not intended to diagnose MRSA infection nor to guide or monitor treatment for MRSA infections. RESULT CALLED TO, READ BACK BY AND VERIFIED WITH: KUFFAUR RN 13:15 02/26/15 ([email or web address])      Studies: Ct Head Wo Contrast  03/01/2015   CLINICAL DATA:  Altered mental status.  EXAM: CT HEAD WITHOUT CONTRAST  TECHNIQUE: Contiguous axial images were obtained from the base of the skull through the vertex without intravenous contrast.  COMPARISON:  CT 06/15/2013  FINDINGS: Extensive low-density and volume loss throughout the left cerebral hemisphere and into the left thalamus and basal ganglia, is stable ex vacuo ventricular enlargement of the left lateral ventricle. Generalized premature atrophy throughout the cerebral hemispheres  and cerebellum. No acute infarct or hemorrhage. No mass effect or midline shift. No hydrocephalus.  IMPRESSION: Extensive low-density throughout the left cerebral hemisphere and extending into the left thalamus and basal ganglia, likely related to prior infarct.  Age advanced atrophy.  No acute intracranial abnormality.   Electronically Signed   By: Rolm Baptise M.D.   On: 03/01/2015 11:45    Scheduled Meds: . allopurinol  100 mg Oral Daily  . aspirin  81 mg Oral Daily  . atorvastatin  10 mg Oral q1800  . baclofen  5 mg Oral TID  . cefTRIAXone (ROCEPHIN)  IV  1 g Intravenous Q24H  . citalopram  20 mg Oral Daily  . feeding supplement (PRO-STAT SUGAR FREE 64)  30 mL Oral BID  .  fentaNYL  25 mcg Transdermal Q72H  . gabapentin  100 mg Oral TID  . heparin  5,000 Units Subcutaneous 3 times per day  . insulin aspart  0-5 Units Subcutaneous QHS  . insulin aspart  0-9 Units Subcutaneous TID WC  . insulin glargine  15 Units Subcutaneous QHS  . isosorbide dinitrate  10 mg Oral BID  . levETIRAcetam  500 mg Oral Q12H  . multivitamin with minerals  1 tablet Oral Daily  . mupirocin ointment  1 application Nasal BID  . neomycin-bacitracin-polymyxin   Topical BID  . pantoprazole sodium  40 mg Oral Daily  . predniSONE  5 mg Oral QAC breakfast  . senna-docusate  1 tablet Oral Daily  . sodium chloride  3 mL Intravenous Q12H  . tacrolimus  3.5 mg Oral BID   Continuous Infusions: . sodium chloride 50 mL/hr at 02/28/15 1345   Antibiotics Given (last 72 hours)    Date/Time Action Medication Dose Rate   02/26/15 1418 Given   imipenem-cilastatin (PRIMAXIN) 500 mg in sodium chloride 0.9 % 100 mL IVPB 500 mg 200 mL/hr   02/26/15 2304 Given   imipenem-cilastatin (PRIMAXIN) 500 mg in sodium chloride 0.9 % 100 mL IVPB 500 mg 200 mL/hr   02/27/15 0451 Given   vancomycin (VANCOCIN) 1,250 mg in sodium chloride 0.9 % 250 mL IVPB 1,250 mg 166.7 mL/hr   02/27/15 2620 Given   imipenem-cilastatin (PRIMAXIN) 500 mg  in sodium chloride 0.9 % 100 mL IVPB 500 mg 200 mL/hr   02/27/15 1649 Given   imipenem-cilastatin (PRIMAXIN) 500 mg in sodium chloride 0.9 % 100 mL IVPB 500 mg 200 mL/hr   02/27/15 2222 Given   imipenem-cilastatin (PRIMAXIN) 500 mg in sodium chloride 0.9 % 100 mL IVPB 500 mg 200 mL/hr   02/28/15 0459 Given   vancomycin (VANCOCIN) 1,250 mg in sodium chloride 0.9 % 250 mL IVPB 1,250 mg 166.7 mL/hr   02/28/15 3559 Given   imipenem-cilastatin (PRIMAXIN) 500 mg in sodium chloride 0.9 % 100 mL IVPB 500 mg 200 mL/hr   02/28/15 1600 Given   cefTRIAXone (ROCEPHIN) 1 g in dextrose 5 % 50 mL IVPB 1 g 100 mL/hr      Principal Problem:   Sepsis Active Problems:   Diabetes mellitus   Hyperlipidemia   Essential hypertension   Intracranial hemorrhage   Peripheral vascular disease   Renal transplant, status post   UTI (lower urinary tract infection)   Acute renal failure superimposed on stage 3 chronic kidney disease   Fever   Gout   GERD (gastroesophageal reflux disease)   Depression    Time spent: 68min    Shahil Speegle  [facility] Pager [phone]. If 7PM-7AM, please contact night-coverage at [email or web address], password [facility] 03/01/2015, 12:45 PM  LOS: 3 days

## 2015-03-02 LAB — COMPREHENSIVE METABOLIC PANEL
ALT: 17 U/L (ref 17–63)
AST: 24 U/L (ref 15–41)
Albumin: 2 g/dL — ABNORMAL LOW (ref 3.5–5.0)
Alkaline Phosphatase: 73 U/L (ref 38–126)
Anion gap: 7 (ref 5–15)
BILIRUBIN TOTAL: 0.7 mg/dL (ref 0.3–1.2)
BUN: 20 mg/dL (ref 6–20)
CALCIUM: 9.7 mg/dL (ref 8.9–10.3)
CO2: 26 mmol/L (ref 22–32)
CREATININE: 1.58 mg/dL — AB (ref 0.61–1.24)
Chloride: 112 mmol/L — ABNORMAL HIGH (ref 101–111)
GFR calc Af Amer: 53 mL/min — ABNORMAL LOW (ref >60)
GFR, EST NON AFRICAN AMERICAN: 46 mL/min — AB (ref >60)
Glucose, Bld: 146 mg/dL — ABNORMAL HIGH (ref 65–99)
Potassium: 4.7 mmol/L (ref 3.5–5.1)
Sodium: 145 mmol/L (ref 135–145)
TOTAL PROTEIN: 6.8 g/dL (ref 6.5–8.1)

## 2015-03-02 LAB — CBC
HEMATOCRIT: 45.3 % (ref 39.0–52.0)
Hemoglobin: 13.6 g/dL (ref 13.0–17.0)
MCH: 27.1 pg (ref 26.0–34.0)
MCHC: 30 g/dL (ref 30.0–36.0)
MCV: 90.2 fL (ref 78.0–100.0)
Platelets: 129 10*3/uL — ABNORMAL LOW (ref 150–400)
RBC: 5.02 MIL/uL (ref 4.22–5.81)
RDW: 14.6 % (ref 11.5–15.5)
WBC: 4.4 10*3/uL (ref 4.0–10.5)

## 2015-03-02 LAB — GLUCOSE, CAPILLARY
GLUCOSE-CAPILLARY: 120 mg/dL — AB (ref 65–99)
GLUCOSE-CAPILLARY: 132 mg/dL — AB (ref 65–99)
GLUCOSE-CAPILLARY: 94 mg/dL (ref 65–99)
GLUCOSE-CAPILLARY: 84 mg/dL (ref 65–99)

## 2015-03-02 LAB — LEGIONELLA ANTIGEN, URINE

## 2015-03-02 MED ORDER — CHLORHEXIDINE GLUCONATE 0.12 % MT SOLN
15.0000 mL | Freq: Two times a day (BID) | OROMUCOSAL | Status: DC
  Administered 2015-03-02 – 2015-03-06 (×6): 15 mL via OROMUCOSAL
  Filled 2015-03-02 (×4): qty 15

## 2015-03-02 MED ORDER — CETYLPYRIDINIUM CHLORIDE 0.05 % MT LIQD
7.0000 mL | Freq: Two times a day (BID) | OROMUCOSAL | Status: DC
  Administered 2015-03-02 – 2015-03-06 (×6): 7 mL via OROMUCOSAL

## 2015-03-02 MED ORDER — GLUCERNA 1.2 CAL PO LIQD
1000.0000 mL | ORAL | Status: DC
  Administered 2015-03-02 – 2015-03-04 (×2): 1000 mL
  Filled 2015-03-02 (×10): qty 1000

## 2015-03-02 MED ORDER — FENTANYL 12 MCG/HR TD PT72
12.5000 ug | MEDICATED_PATCH | TRANSDERMAL | Status: DC
  Administered 2015-03-04: 12.5 ug via TRANSDERMAL
  Filled 2015-03-02: qty 1

## 2015-03-02 MED ORDER — JEVITY 1.2 CAL PO LIQD: 1000.0000 mL | ORAL | Status: DC

## 2015-03-02 NOTE — Care Management Note (Signed)
Case Management Note Marvetta Gibbons RN, BSN Unit 2W-Case Manager [phone]  Patient Details  Name: Justin [name] MRN: [id number] Date of Birth: 1955/11/01  Subjective/Objective:     Pt admitted with sepsis               Action/Plan: PTA pt lived at [place] consulted for return to SNF when medically stable  Expected Discharge Date:                  Expected Discharge Plan:   Skilled Nursing Facility  In-House Referral:     Discharge planning Services     Post Acute Care Choice:    Choice offered to:     DME Arranged:    DME Agency:     HH Arranged:    [place] Agency:     Status of Service:   In progress  Medicare Important Message Given:    Date Medicare IM Given:    Medicare IM give by:    Date Additional Medicare IM Given:    Additional Medicare Important Message give by:     If discussed at [place] of Stay Meetings, dates discussed:  03/03/15  Additional Comments:  Dawayne Patricia, RN 03/02/2015, 1:57 PM

## 2015-03-02 NOTE — Care Management Important Message (Signed)
Important Message  Patient Details  Name: Justin [name] MRN: [id number] Date of Birth: [birth year]   Medicare Important Message Given:  Yes-second notification given    [name] 03/02/2015, 3:49 PM

## 2015-03-02 NOTE — Progress Notes (Signed)
CSW will continue to follow for return to [facility] when medially stable.  Domenica Reamer, [place] Social Worker [phone]

## 2015-03-02 NOTE — Progress Notes (Signed)
03/02/2015 1845 TF/Glucerna started per Nutrition recommendations @ 20cc's an hour. [place]

## 2015-03-02 NOTE — Progress Notes (Signed)
Speech Language Pathology Treatment: Dysphagia  Patient Details Name: Justin [name] MRN: [id number] DOB: 1955-11-12 Today's Date: 03/02/2015 Time: 2633-3545 SLP Time Calculation (min) (ACUTE ONLY): 8 min  Assessment / Plan / Recommendation Clinical Impression  Attempted observation with spoon and cup trials nectar liquid and puree consistency. Pt allowed small amount pudding on lower lip, refused to open oral cavity and pushed SLP's hand away despite verbal encouragement and cueing. Pt would not allow oral care. RN tech reported pt was not cooperative for po's at breakfast this am. Large bore NGT placed yesterday per RN. SLP recommends continuing attempts at oral care and intake and continued ST.   HPI Other Pertinent Information: Justin [name] is a 59 y.o. male, SNF resident, with PMH of kidney transplantation, CKD-III, hypertension, hyperlipidemia, diabetes mellitus, GERD, gout, depression, ICH with right-sided paralysis, nonverbal, GI bleeding, PVD, who presents with fever.  CXR showed shallow inspiration with infiltration or atelectasis in the left lung base. MBS on 4/19 recommends Dys 1/nectar due to aspiration of thin liquids with delayed sensation.    Pertinent Vitals Pain Assessment:  (no indications)  SLP Plan  Continue with current plan of care    Recommendations Diet recommendations: Dysphagia 1 (puree);Nectar-thick liquid Liquids provided via: Cup;Teaspoon Medication Administration: Crushed with puree Supervision: Staff to assist with self feeding;Full supervision/cueing for compensatory strategies Compensations: Slow rate;Small sips/bites Postural Changes and/or Swallow Maneuvers: Seated upright 90 degrees              Oral Care Recommendations: Oral care BID Follow up Recommendations: Skilled Nursing facility Plan: Continue with current plan of care    GO     Houston Siren 03/02/2015, 12:06 PM   Orbie Pyo Colvin Caroli.Ed [employer] [phone]

## 2015-03-02 NOTE — Progress Notes (Addendum)
Nutrition Follow-up/Consult  DOCUMENTATION CODES:   Not applicable  INTERVENTION:   Initiate Glucerna 1.2 formula via NGT at 20 ml/hr and increase by 10 ml every 4 hours to goal rate of 80 ml/hr   TF regimen to provide 2304 kcals, 115 gm protein,1545 ml of free water  NUTRITION DIAGNOSIS:   Increased nutrient needs related to wound healing as evidenced by estimated needs, ongoing  GOAL:   Patient will meet greater than or equal to 90% of their needs, currently unmet  MONITOR:   TF tolerance, PO intake, Labs, Weight trends, Skin, I & O's  ASSESSMENT:   59 y.o. Male with PMH of kidney transplantation, CKD-III, hypertension, hyperlipidemia, diabetes mellitus, GERD, gout, depression, ICH with right-sided paralysis, nonverbal, GI bleeding, PVD, who presented with fever.  Pt obtunded.  Continues on a Dys 1-nectar thick liquids diet.  Speech Path following.  Spoke with RN.  Pt's PO intake very poor.  NGT in place.  Noted may need PEG tube for long-term nutrition support.  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   RD consulted for TF initiation & management.  Diet Order:  DIET - DYS 1 Room service appropriate?: Yes; Fluid consistency:: Nectar Thick  Skin:  Wound (see comment) (full thickness rt elbow, open lt leg, st II lt sacrum)  Last BM:  8/26  Height:   Ht Readings from Last 1 Encounters:  02/26/15 6\' 1"  (1.854 m)    Weight:   Wt Readings from Last 1 Encounters:  02/26/15 170 lb 1.6 oz (77.157 kg)    Ideal Body Weight:  83.6 kg  BMI:  Body mass index is 22.45 kg/(m^2).  Estimated Nutritional Needs:   Kcal:  8469-6295  Protein:  105-115 grams  Fluid:  >2.3 L  EDUCATION NEEDS:   No education needs identified at this time  Arthur Holms, RD, LDN Pager #: [phone] After-Hours Pager #: [phone]

## 2015-03-02 NOTE — Care Management Important Message (Signed)
Important Message  Patient Details  Name: Justin [name] MRN: [id number] Date of Birth: 07/06/1955   Medicare Important Message Given:  Yes-second notification given    Dawayne Patricia, RN 03/02/2015, 3:43 PM

## 2015-03-02 NOTE — Progress Notes (Signed)
[facility] PROGRESS NOTE  Justin [name]:[id number] DOB: May 18, 1956 DOA: 02/25/2015 PCP: Garwin Brothers, MD  Assessment/Plan: Sepsis -likely due to [facility] UTI,  -improving, lactic acid better -cut down iVF, urine Cx with EColi, sensitivities noted -stopped Vancomycin,  -PCN allergy reported but tolerated cephalosporins in the past, switched to Ceftriaxone 8/28  S/p kidney transplantation -creatinine improved and then worsened, continue prednisone, prograf -resume IVF  Encephalopathy -due to infection, underlying neuro disease, meds -cut down baclofen, stop tizanidine -CT head without acute changes -may need PEG for nutrition if mentation not improved, discussed with RN, pocketing of food, meds, Discussed with daughter who reports pocketing of food in the last 1-2 months as well -if PO intake not improved, will need PEG, d/w daughter April Dust today, who is agreeable -NG tube for meds, PO intake too  AKI on CKD-III  -worsened again, IVF resumed  Hypertension -stable, continue isordil  Diabetes mellitus -continue lantus, SSI -Last A1c 7.9 on 10/20/14  ICH with right-sided paralysis, Aphasia -cut down home regimen of baclofen, fentanyl, neurontin, -on D1 diet s/p SLP eval  GERD -PPI  DVT proph: HEp SQ  Code Status: Full Code Family Communication: none at bedside, left msg for daughter 8/28, d/w April Layton 8/29 Disposition Plan: SNF in few days if stable  HPI/Subjective: No events overnight, not eating much, pocketing food  Objective: Filed Vitals:   03/02/15 0505  BP: 121/68  Pulse: 71  Temp: 97.9 F (36.6 C)  Resp: 18    Intake/Output Summary (Last 24 hours) at 03/02/15 1233 Last data filed at 03/02/15 1222  Gross per 24 hour  Intake   1900 ml  Output   2250 ml  Net   -350 ml   Filed Weights   02/26/15 0010 02/26/15 0511  Weight: 82.101 kg (181 lb) 77.157 kg (170 lb 1.6 oz)    Exam:   General:  Awake, Aphasic, more alert  today  Cardiovascular: S1S2/RRR  Respiratory: diminihsed bs at bases  Abdomen: soft, NT, BS present  Musculoskeletal: no edema   GU: skin breakdown of penile skin with warts  R hemiplegia, aphasic   Data Reviewed: Basic Metabolic Panel:  Recent Labs Lab 02/26/15 0810 02/27/15 1017 02/28/15 0302 03/01/15 0426 03/02/15 0313  NA 144 147* 145 144 145  K 4.4 3.0* 4.9 4.9 4.7  CL 110 125* 115* 112* 112*  CO2 29 19* 25 28 26   GLUCOSE 321* 189* 146* 290* 146*  BUN 28* 19 25* 24* 20  CREATININE 1.85* 1.17 1.48* 1.61* 1.58*  CALCIUM 8.8* 6.4* 10.0 9.9 9.7   Liver Function Tests:  Recent Labs Lab 02/26/15 0025 03/02/15 0313  AST 32 24  ALT 22 17  ALKPHOS 90 73  BILITOT 0.4 0.7  PROT 7.2 6.8  ALBUMIN 2.4* 2.0*   No results for input(s): LIPASE, AMYLASE in the last 168 hours. No results for input(s): AMMONIA in the last 168 hours. CBC:  Recent Labs Lab 02/26/15 0025 02/26/15 0810 02/27/15 1017 02/28/15 0302 03/01/15 0426 03/02/15 0313  WBC 5.3 4.8 4.5 4.3 4.3 4.4  NEUTROABS 3.7  --   --   --   --   --   HGB 14.3 13.0 10.2* 13.4 13.7 13.6  HCT 47.9 44.2 34.5* 45.5 46.5 45.3  MCV 91.6 91.5 92.5 91.2 91.9 90.2  PLT 135* 94* 92* 118* 141* 129*   Cardiac Enzymes:  Recent Labs Lab 02/26/15 0810 02/26/15 1604 02/26/15 2025  TROPONINI 0.08* 0.03 0.05*   BNP (last 3  results) No results for input(s): BNP in the last 8760 hours.  ProBNP (last 3 results) No results for input(s): PROBNP in the last 8760 hours.  CBG:  Recent Labs Lab 03/01/15 1202 03/01/15 1632 03/01/15 2055 03/02/15 0608 03/02/15 1125  GLUCAP 159* 153* 121* 120* 132*    Recent Results (from the past 240 hour(s))  Culture, blood (routine x 2)     Status: None (Preliminary result)   Collection Time: 02/26/15 12:25 AM  Result Value Ref Range Status   Specimen Description BLOOD LEFT ARM  Final   Special Requests   Final    BOTTLES DRAWN AEROBIC AND ANAEROBIC 10CCBLUE 3CC RED    Culture NO GROWTH 4 DAYS  Final   Report Status PENDING  Incomplete  Culture, blood (routine x 2)     Status: None (Preliminary result)   Collection Time: 02/26/15 12:37 AM  Result Value Ref Range Status   Specimen Description BLOOD LEFT WRIST  Final   Special Requests BOTTLES DRAWN AEROBIC AND ANAEROBIC 10CC  Final   Culture NO GROWTH 4 DAYS  Final   Report Status PENDING  Incomplete  Urine culture     Status: None   Collection Time: 02/26/15  1:05 AM  Result Value Ref Range Status   Specimen Description URINE, CATHETERIZED  Final   Special Requests NONE  Final   Culture >=100,000 COLONIES/mL ESCHERICHIA COLI  Final   Report Status 02/28/2015 FINAL  Final   Organism ID, Bacteria ESCHERICHIA COLI  Final      Susceptibility   Escherichia coli - MIC*    AMPICILLIN >=32 RESISTANT Resistant     CEFAZOLIN <=4 SENSITIVE Sensitive     CEFTRIAXONE <=1 SENSITIVE Sensitive     CIPROFLOXACIN >=4 RESISTANT Resistant     GENTAMICIN >=16 RESISTANT Resistant     IMIPENEM <=0.25 SENSITIVE Sensitive     NITROFURANTOIN <=16 SENSITIVE Sensitive     TRIMETH/SULFA <=20 SENSITIVE Sensitive     AMPICILLIN/SULBACTAM 16 INTERMEDIATE Intermediate     PIP/TAZO <=4 SENSITIVE Sensitive     * >=100,000 COLONIES/mL ESCHERICHIA COLI  MRSA PCR Screening     Status: Abnormal   Collection Time: 02/26/15 11:33 AM  Result Value Ref Range Status   MRSA by PCR POSITIVE (A) NEGATIVE Final    Comment:        The GeneXpert MRSA Assay (FDA approved for NASAL specimens only), is one component of a comprehensive MRSA colonization surveillance program. It is not intended to diagnose MRSA infection nor to guide or monitor treatment for MRSA infections. RESULT CALLED TO, READ BACK BY AND VERIFIED WITH: KUFFAUR RN 13:15 02/26/15 ([email or web address])      Studies: Ct Head Wo Contrast  03/01/2015   CLINICAL DATA:  Altered mental status.  EXAM: CT HEAD WITHOUT CONTRAST  TECHNIQUE: Contiguous axial images were obtained from the  base of the skull through the vertex without intravenous contrast.  COMPARISON:  CT 06/15/2013  FINDINGS: Extensive low-density and volume loss throughout the left cerebral hemisphere and into the left thalamus and basal ganglia, is stable ex vacuo ventricular enlargement of the left lateral ventricle. Generalized premature atrophy throughout the cerebral hemispheres and cerebellum. No acute infarct or hemorrhage. No mass effect or midline shift. No hydrocephalus.  IMPRESSION: Extensive low-density throughout the left cerebral hemisphere and extending into the left thalamus and basal ganglia, likely related to prior infarct.  Age advanced atrophy.  No acute intracranial abnormality.   Electronically Signed   By: Rolm Baptise M.D.  On: 03/01/2015 11:45   Dg Chest Port 1 View  03/01/2015   CLINICAL DATA:  Nasogastric tube placement  EXAM: PORTABLE CHEST - 1 VIEW  COMPARISON:  Portable exam 1725 hours compared to 02/26/2015  FINDINGS: Nasogastric tube extends into stomach.  Rotated to the LEFT.  Upper normal heart size.  Mediastinal contours and pulmonary vascularity normal.  Atelectasis versus consolidation in LEFT lower lobe.  Remaining lungs clear.  Bones demineralized.  IMPRESSION: Nasogastric tube extends into stomach.  LEFT lower lobe atelectasis versus consolidation.   Electronically Signed   By: Lavonia Dana M.D.   On: 03/01/2015 17:32    Scheduled Meds: . allopurinol  100 mg Oral Daily  . antiseptic oral rinse  7 mL Mouth Rinse q12n4p  . aspirin  81 mg Oral Daily  . atorvastatin  10 mg Oral q1800  . baclofen  5 mg Oral TID  . cefTRIAXone (ROCEPHIN)  IV  1 g Intravenous Q24H  . chlorhexidine  15 mL Mouth Rinse BID  . citalopram  20 mg Oral Daily  . feeding supplement (PRO-STAT SUGAR FREE 64)  30 mL Oral BID  . [START ON 03/04/2015] fentaNYL  12.5 mcg Transdermal Q72H  . gabapentin  100 mg Oral TID  . heparin  5,000 Units Subcutaneous 3 times per day  . insulin aspart  0-5 Units Subcutaneous  QHS  . insulin aspart  0-9 Units Subcutaneous TID WC  . insulin glargine  15 Units Subcutaneous QHS  . isosorbide dinitrate  10 mg Oral BID  . levETIRAcetam  500 mg Intravenous Q12H  . multivitamin with minerals  1 tablet Oral Daily  . mupirocin ointment  1 application Nasal BID  . neomycin-bacitracin-polymyxin   Topical BID  . pantoprazole sodium  40 mg Oral Daily  . predniSONE  5 mg Oral QAC breakfast  . senna-docusate  1 tablet Oral Daily  . sodium chloride  3 mL Intravenous Q12H  . tacrolimus  3.5 mg Oral BID   Continuous Infusions: . sodium chloride 50 mL/hr at 03/02/15 0455   Antibiotics Given (last 72 hours)    Date/Time Action Medication Dose Rate   02/27/15 1649 Given   imipenem-cilastatin (PRIMAXIN) 500 mg in sodium chloride 0.9 % 100 mL IVPB 500 mg 200 mL/hr   02/27/15 2222 Given   imipenem-cilastatin (PRIMAXIN) 500 mg in sodium chloride 0.9 % 100 mL IVPB 500 mg 200 mL/hr   02/28/15 0459 Given   vancomycin (VANCOCIN) 1,250 mg in sodium chloride 0.9 % 250 mL IVPB 1,250 mg 166.7 mL/hr   02/28/15 7628 Given   imipenem-cilastatin (PRIMAXIN) 500 mg in sodium chloride 0.9 % 100 mL IVPB 500 mg 200 mL/hr   02/28/15 1600 Given   cefTRIAXone (ROCEPHIN) 1 g in dextrose 5 % 50 mL IVPB 1 g 100 mL/hr   03/01/15 2142 Given   cefTRIAXone (ROCEPHIN) 1 g in dextrose 5 % 50 mL IVPB 1 g 100 mL/hr      Principal Problem:   Sepsis Active Problems:   Diabetes mellitus   Hyperlipidemia   Essential hypertension   Intracranial hemorrhage   Peripheral vascular disease   Renal transplant, status post   UTI (lower urinary tract infection)   Acute renal failure superimposed on stage 3 chronic kidney disease   Fever   Gout   GERD (gastroesophageal reflux disease)   Depression    Time spent: 64min    Soha Thorup  [facility] Pager [phone]. If 7PM-7AM, please contact night-coverage at [email or web address], password [facility] 03/02/2015, 12:33  PM  LOS: 4 days

## 2015-03-02 NOTE — Progress Notes (Signed)
Physical Therapy Treatment and Discharge Patient Details Name: Justin [name] MRN: [id number] DOB: July 09, 1955 Today's Date: 03/02/2015    History of Present Illness Pt is a 59 y.o. male with PMH of kidney transplantation, CKD-III, hypertension, hyperlipidemia, diabetes mellitus, GERD, gout, depression, ICH with right-sided paralysis, nonverbal, GI bleeding, PVD, who presents with fever.    PT Comments    Pt not showing progress towards physical therapy goals. Pt did not communicate with therapist throughout session and demonstrated no active movement. Encourged active ROM of L upper and lower extremities with no result. Pt is a long term resident of a nursing facility and it is likely that pt is at or close to his baseline of function. Do not feel that pt has any further acute therapy needs, and PT will sign off at this time. If needs change, please reconsult.   Follow Up Recommendations  SNF;Supervision/Assistance - 24 hour     Equipment Recommendations  None recommended by PT    Recommendations for Other Services       Precautions / Restrictions Precautions Precautions: Fall Precaution Comments: Pt has open sores around genitals - per chart likely warts/herpes  Restrictions Weight Bearing Restrictions: No    Mobility  Bed Mobility                  Transfers                    Ambulation/Gait                 Stairs            Wheelchair Mobility    Modified Rankin (Stroke Patients Only)       Balance                                    Cognition Arousal/Alertness: Awake/alert Behavior During Therapy: Flat affect Overall Cognitive Status: History of cognitive impairments - at baseline                      Exercises General Exercises - Lower Extremity Straight Leg Raises: 5 reps;PROM (With intention of getting pt to demonstrate active movement.) Low Level/ICU Exercises Ankle Circles/Pumps: 10 reps;PROM Hip  ABduction/ADduction: 10 reps;PROM (With intention of getting pt to demonstrate active movement.) Heel Slides: Other (comment) (Pt resisting knee flexion. Would not show active movement) Shoulder Flexion: 5 reps;PROM;Left (With intention of getting pt to demonstrate active movement.) Elbow Flexion: 10 reps;PROM (With intention of getting pt to demonstrate active movement.)    General Comments        Pertinent Vitals/Pain Pain Assessment: Faces Faces Pain Scale: No hurt    Home Living                      Prior Function            PT Goals (current goals can now be found in the care plan section) Acute Rehab PT Goals PT Goal Formulation: Patient unable to participate in goal setting Time For Goal Achievement: 03/12/15 Potential to Achieve Goals: Fair Progress towards PT goals: Not progressing toward goals - comment    Frequency  Min 2X/week    PT Plan Current plan remains appropriate    Co-evaluation             End of Session   Activity Tolerance: Treatment limited secondary to  medical complications (Comment) (Cognitive status) Patient left: in bed;with call bell/phone within reach;with restraints reapplied     Time: 1258-1309 PT Time Calculation (min) (ACUTE ONLY): 11 min  Charges:  $Therapeutic Activity: 8-22 mins                    G Codes:      Rolinda Roan March 29, 2015, 3:05 PM   Rolinda Roan, PT, DPT Acute Rehabilitation Services Pager: [phone]

## 2015-03-02 NOTE — Progress Notes (Signed)
Utilization review completed.  

## 2015-03-03 ENCOUNTER — Inpatient Hospital Stay (HOSPITAL_COMMUNITY): Payer: Medicare Other

## 2015-03-03 LAB — GLUCOSE, CAPILLARY
GLUCOSE-CAPILLARY: 114 mg/dL — AB (ref 65–99)
GLUCOSE-CAPILLARY: 128 mg/dL — AB (ref 65–99)
GLUCOSE-CAPILLARY: 138 mg/dL — AB (ref 65–99)
GLUCOSE-CAPILLARY: 82 mg/dL (ref 65–99)
Glucose-Capillary: 93 mg/dL (ref 65–99)
Glucose-Capillary: 126 mg/dL — ABNORMAL HIGH (ref 65–99)

## 2015-03-03 LAB — BASIC METABOLIC PANEL
ANION GAP: 5 (ref 5–15)
BUN: 19 mg/dL (ref 6–20)
CALCIUM: 9.8 mg/dL (ref 8.9–10.3)
CO2: 27 mmol/L (ref 22–32)
Chloride: 112 mmol/L — ABNORMAL HIGH (ref 101–111)
Creatinine, Ser: 1.56 mg/dL — ABNORMAL HIGH (ref 0.61–1.24)
GFR, EST AFRICAN AMERICAN: 54 mL/min — AB (ref >60)
GFR, EST NON AFRICAN AMERICAN: 46 mL/min — AB (ref >60)
Glucose, Bld: 118 mg/dL — ABNORMAL HIGH (ref 65–99)
Potassium: 4.4 mmol/L (ref 3.5–5.1)
SODIUM: 144 mmol/L (ref 135–145)

## 2015-03-03 LAB — CULTURE, BLOOD (ROUTINE X 2)
CULTURE: NO GROWTH
Culture: NO GROWTH

## 2015-03-03 MED ORDER — INSULIN ASPART 100 UNIT/ML ~~LOC~~ SOLN
0.0000 [IU] | SUBCUTANEOUS | Status: DC
  Administered 2015-03-03 (×2): 1 [IU] via SUBCUTANEOUS
  Administered 2015-03-04: 3 [IU] via SUBCUTANEOUS
  Administered 2015-03-04 – 2015-03-05 (×4): 1 [IU] via SUBCUTANEOUS
  Administered 2015-03-05: 2 [IU] via SUBCUTANEOUS
  Administered 2015-03-05: 3 [IU] via SUBCUTANEOUS
  Administered 2015-03-05: 2 [IU] via SUBCUTANEOUS

## 2015-03-03 NOTE — Progress Notes (Signed)
[facility] PROGRESS NOTE  Justin [name]:[id number] DOB: [birth year] DOA: 02/25/2015 PCP: Garwin Brothers, MD   Justin [name] is a 59 y.o. male with PMH of [place] with right hemiplegia, aphasia, , nonverbal, h/o kidney transplantation, CKD-III, hypertension, hyperlipidemia, diabetes mellitus, GERD, gout, depression, admitted with Sepsis, fever, UTI and AKI. Sepsis, improved, grew Ecoli in Urine, now on IV rocephin. His mentation has been declining and fluctuating more in the last few months, Po intake, meds very unreliable, pocketing meds hence NG placed yesterday. After discussion with daughter April, decision made for PEG tube for nutrition, meds  Assessment/Plan: Sepsis -likely due to [facility] UTI,  -improving, lactic acid better -cut down iVF, urine Cx with EColi, sensitivities noted -stopped Vancomycin,  -PCN allergy reported but tolerated cephalosporins in the past, switched to Ceftriaxone 8/28, stop ceftriaxone on 9/1  S/p kidney transplantation -creatinine improved and then worsened, continue prednisone, prograf -resumed IVF due to poor PO intake  Encephalopathy/Dementia -has Aphasia, Dysphagia, R hemiplegia at baseline from [place] in past, mentation fluctuates -due to infection, underlying neuro disease, meds -cut down baclofen, stopped tizanidine -CT head without acute changes, mentation has been fluctuating more in the last few months -Will need PEG placement, mentation and PO/Med intake very unreliable, consistently now, per RNs, pt consistently noted to be pocketing of food, meds, -after long discussion with Daughter [name], decision made for PEG tube -IR consult placed -continue PO intake and meds via NG tube for now  AKI on CKD-III  -worsened again, IVF resumed  Hypertension -stable, continue isordil  Diabetes mellitus -continue lantus, SSI -Last A1c 7.9 on 10/20/14  GERD -PPI  DVT proph: HEp SQ  Code Status: Full Code Family Communication: none at  bedside, left msg for daughter 8/28, d/w April Sattler 8/29 and 8/30 Disposition Plan: SNF in few days if stable  HPI/Subjective: No events overnight, not eating much, pocketing food  Objective: Filed Vitals:   03/03/15 0427  BP: 105/64  Pulse: 77  Temp: 97.5 F (36.4 C)  Resp: 17    Intake/Output Summary (Last 24 hours) at 03/03/15 1235 Last data filed at 03/03/15 0900  Gross per 24 hour  Intake    105 ml  Output   1650 ml  Net  -1545 ml   Filed Weights   02/26/15 0010 02/26/15 0511 03/03/15 0900  Weight: 82.101 kg (181 lb) 77.157 kg (170 lb 1.6 oz) 83.054 kg (183 lb 1.6 oz)    Exam:   General:  Awake, Aphasic, more alert today  Cardiovascular: S1S2/RRR  Respiratory: diminihsed bs at bases  Abdomen: soft, NT, BS present  Musculoskeletal: no edema   GU: skin breakdown of penile skin with warts  R hemiplegia, aphasic   Data Reviewed: Basic Metabolic Panel:  Recent Labs Lab 02/27/15 1017 02/28/15 0302 03/01/15 0426 03/02/15 0313 03/03/15 0942  NA 147* 145 144 145 144  K 3.0* 4.9 4.9 4.7 4.4  CL 125* 115* 112* 112* 112*  CO2 19* 25 28 26 27   GLUCOSE 189* 146* 290* 146* 118*  BUN 19 25* 24* 20 19  CREATININE 1.17 1.48* 1.61* 1.58* 1.56*  CALCIUM 6.4* 10.0 9.9 9.7 9.8   Liver Function Tests:  Recent Labs Lab 02/26/15 0025 03/02/15 0313  AST 32 24  ALT 22 17  ALKPHOS 90 73  BILITOT 0.4 0.7  PROT 7.2 6.8  ALBUMIN 2.4* 2.0*   No results for input(s): LIPASE, AMYLASE in the last 168 hours. No results for input(s): AMMONIA in the last  168 hours. CBC:  Recent Labs Lab 02/26/15 0025 02/26/15 0810 02/27/15 1017 02/28/15 0302 03/01/15 0426 03/02/15 0313  WBC 5.3 4.8 4.5 4.3 4.3 4.4  NEUTROABS 3.7  --   --   --   --   --   HGB 14.3 13.0 10.2* 13.4 13.7 13.6  HCT 47.9 44.2 34.5* 45.5 46.5 45.3  MCV 91.6 91.5 92.5 91.2 91.9 90.2  PLT 135* 94* 92* 118* 141* 129*   Cardiac Enzymes:  Recent Labs Lab 02/26/15 0810 02/26/15 1604  02/26/15 2025  TROPONINI 0.08* 0.03 0.05*   BNP (last 3 results) No results for input(s): BNP in the last 8760 hours.  ProBNP (last 3 results) No results for input(s): PROBNP in the last 8760 hours.  CBG:  Recent Labs Lab 03/02/15 2145 03/03/15 0021 03/03/15 0423 03/03/15 0944 03/03/15 1144  GLUCAP 84 82 93 114* 138*    Recent Results (from the past 240 hour(s))  Culture, blood (routine x 2)     Status: None (Preliminary result)   Collection Time: 02/26/15 12:25 AM  Result Value Ref Range Status   Specimen Description BLOOD LEFT ARM  Final   Special Requests   Final    BOTTLES DRAWN AEROBIC AND ANAEROBIC 10CCBLUE 3CC RED   Culture NO GROWTH 4 DAYS  Final   Report Status PENDING  Incomplete  Culture, blood (routine x 2)     Status: None (Preliminary result)   Collection Time: 02/26/15 12:37 AM  Result Value Ref Range Status   Specimen Description BLOOD LEFT WRIST  Final   Special Requests BOTTLES DRAWN AEROBIC AND ANAEROBIC 10CC  Final   Culture NO GROWTH 4 DAYS  Final   Report Status PENDING  Incomplete  Urine culture     Status: None   Collection Time: 02/26/15  1:05 AM  Result Value Ref Range Status   Specimen Description URINE, CATHETERIZED  Final   Special Requests NONE  Final   Culture >=100,000 COLONIES/mL ESCHERICHIA COLI  Final   Report Status 02/28/2015 FINAL  Final   Organism ID, Bacteria ESCHERICHIA COLI  Final      Susceptibility   Escherichia coli - MIC*    AMPICILLIN >=32 RESISTANT Resistant     CEFAZOLIN <=4 SENSITIVE Sensitive     CEFTRIAXONE <=1 SENSITIVE Sensitive     CIPROFLOXACIN >=4 RESISTANT Resistant     GENTAMICIN >=16 RESISTANT Resistant     IMIPENEM <=0.25 SENSITIVE Sensitive     NITROFURANTOIN <=16 SENSITIVE Sensitive     TRIMETH/SULFA <=20 SENSITIVE Sensitive     AMPICILLIN/SULBACTAM 16 INTERMEDIATE Intermediate     PIP/TAZO <=4 SENSITIVE Sensitive     * >=100,000 COLONIES/mL ESCHERICHIA COLI  MRSA PCR Screening     Status:  Abnormal   Collection Time: 02/26/15 11:33 AM  Result Value Ref Range Status   MRSA by PCR POSITIVE (A) NEGATIVE Final    Comment:        The GeneXpert MRSA Assay (FDA approved for NASAL specimens only), is one component of a comprehensive MRSA colonization surveillance program. It is not intended to diagnose MRSA infection nor to guide or monitor treatment for MRSA infections. RESULT CALLED TO, READ BACK BY AND VERIFIED WITH: KUFFAUR RN 13:15 02/26/15 ([email or web address])      Studies: Dg Chest Port 1 View  03/01/2015   CLINICAL DATA:  Nasogastric tube placement  EXAM: PORTABLE CHEST - 1 VIEW  COMPARISON:  Portable exam 1725 hours compared to 02/26/2015  FINDINGS: Nasogastric tube extends into stomach.  Rotated to the LEFT.  Upper normal heart size.  Mediastinal contours and pulmonary vascularity normal.  Atelectasis versus consolidation in LEFT lower lobe.  Remaining lungs clear.  Bones demineralized.  IMPRESSION: Nasogastric tube extends into stomach.  LEFT lower lobe atelectasis versus consolidation.   Electronically Signed   By: Lavonia Dana M.D.   On: 03/01/2015 17:32    Scheduled Meds: . allopurinol  100 mg Oral Daily  . antiseptic oral rinse  7 mL Mouth Rinse q12n4p  . aspirin  81 mg Oral Daily  . atorvastatin  10 mg Oral q1800  . baclofen  5 mg Oral TID  . cefTRIAXone (ROCEPHIN)  IV  1 g Intravenous Q24H  . chlorhexidine  15 mL Mouth Rinse BID  . citalopram  20 mg Oral Daily  . [START ON 03/04/2015] fentaNYL  12.5 mcg Transdermal Q72H  . gabapentin  100 mg Oral TID  . heparin  5,000 Units Subcutaneous 3 times per day  . insulin aspart  0-9 Units Subcutaneous 6 times per day  . insulin glargine  15 Units Subcutaneous QHS  . isosorbide dinitrate  10 mg Oral BID  . levETIRAcetam  500 mg Intravenous Q12H  . multivitamin with minerals  1 tablet Oral Daily  . mupirocin ointment  1 application Nasal BID  . neomycin-bacitracin-polymyxin   Topical BID  . pantoprazole sodium  40 mg Oral  Daily  . predniSONE  5 mg Oral QAC breakfast  . senna-docusate  1 tablet Oral Daily  . sodium chloride  3 mL Intravenous Q12H  . tacrolimus  3.5 mg Oral BID   Continuous Infusions: . sodium chloride 50 mL/hr at 03/03/15 0231  . feeding supplement (GLUCERNA 1.2 CAL) 1,000 mL (03/03/15 0240)   Antibiotics Given (last 72 hours)    Date/Time Action Medication Dose Rate   02/28/15 1600 Given   cefTRIAXone (ROCEPHIN) 1 g in dextrose 5 % 50 mL IVPB 1 g 100 mL/hr   03/01/15 2142 Given   cefTRIAXone (ROCEPHIN) 1 g in dextrose 5 % 50 mL IVPB 1 g 100 mL/hr   03/02/15 1442 Given   cefTRIAXone (ROCEPHIN) 1 g in dextrose 5 % 50 mL IVPB 1 g 100 mL/hr      Principal Problem:   Sepsis Active Problems:   Diabetes mellitus   Hyperlipidemia   Essential hypertension   Intracranial hemorrhage   Peripheral vascular disease   Renal transplant, status post   UTI (lower urinary tract infection)   Acute renal failure superimposed on stage 3 chronic kidney disease   Fever   Gout   GERD (gastroesophageal reflux disease)   Depression    Time spent: 61min    Terrill Alperin  [facility] Pager [phone]. If 7PM-7AM, please contact night-coverage at [email or web address], password [facility] 03/03/2015, 12:35 PM  LOS: 5 days

## 2015-03-03 NOTE — Progress Notes (Signed)
Patient ID: Justin [name], male   DOB: 01/15/1956, 59 y.o.   MRN: [id number] Aware of request for G tube placement in pt. Hx noted. Pt has had G tube in past and removed in 2010. Case reviewed by Dr. Barbie Banner. Will order CT abd to reassess anatomy prior to tube placement. Will f/u after scan completed.

## 2015-03-04 DIAGNOSIS — N183 Chronic kidney disease, stage 3 (moderate): Secondary | ICD-10-CM

## 2015-03-04 DIAGNOSIS — N179 Acute kidney failure, unspecified: Secondary | ICD-10-CM

## 2015-03-04 DIAGNOSIS — A419 Sepsis, unspecified organism: Secondary | ICD-10-CM

## 2015-03-04 DIAGNOSIS — Z940 Kidney transplant status: Secondary | ICD-10-CM

## 2015-03-04 DIAGNOSIS — I10 Essential (primary) hypertension: Secondary | ICD-10-CM

## 2015-03-04 LAB — GLUCOSE, CAPILLARY
GLUCOSE-CAPILLARY: 129 mg/dL — AB (ref 65–99)
GLUCOSE-CAPILLARY: 149 mg/dL — AB (ref 65–99)
GLUCOSE-CAPILLARY: 154 mg/dL — AB (ref 65–99)
GLUCOSE-CAPILLARY: 162 mg/dL — AB (ref 65–99)
GLUCOSE-CAPILLARY: 214 mg/dL — AB (ref 65–99)
Glucose-Capillary: 213 mg/dL — ABNORMAL HIGH (ref 65–99)

## 2015-03-04 MED ORDER — FREE WATER
200.0000 mL | Freq: Three times a day (TID) | Status: DC
  Administered 2015-03-05 – 2015-03-06 (×2): 200 mL

## 2015-03-04 MED ORDER — SODIUM CHLORIDE 0.45 % IV SOLN
INTRAVENOUS | Status: AC
  Administered 2015-03-04: 11:00:00 via INTRAVENOUS

## 2015-03-04 MED ORDER — HEPARIN SODIUM (PORCINE) 5000 UNIT/ML IJ SOLN
5000.0000 [IU] | Freq: Three times a day (TID) | INTRAMUSCULAR | Status: DC
  Administered 2015-03-04 – 2015-03-06 (×4): 5000 [IU] via SUBCUTANEOUS
  Filled 2015-03-04 (×7): qty 1

## 2015-03-04 NOTE — Consult Note (Signed)
Chief Complaint: Patient was seen in consultation today for dysphagia; malnutrtion Chief Complaint  Patient presents with  . Fever   at the request of Dr Broadus [name]  Referring Physician(s): [facility] Dr Domenic Polite  History of Present Illness: Justin [name] is a 59 y.o. male   Pt is Non verbal nursing home pt Previous intracranial hemorrhage Admitted 02/25/15 with sepsis/UTI Treated with Rocephin since admission---finish tx 9/1 Malnutrition; dysphagia Need for long term care Failed swallow study Request for percutaneous gastric tube placement Imaging has been reviewed with Dr Redgie Grayer procedure I have seen and examined pt    Past Medical History  Diagnosis Date  . Hypertension   . Dyslipidemia   . Carpal tunnel syndrome   . A-V fistula   . Anemia   . Hemorrhoids   . Hyperparathyroidism   . Intracranial hemorrhage   . Hypotension   . Hyperkalemia   . Kidney disease   . Sepsis(995.91)   . GI bleeding   . DVT (deep venous thrombosis)   . PVD (peripheral vascular disease)   . DM (diabetes mellitus)   . Cellulitis   . IV infiltration     Right upper extremity  . PPD positive   . 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 Hypokinesis     EF of 60% with mild anterior hypokinesis, minimal coronary artery disease on catheterization in December of 2007  . Gout     Past Surgical History  Procedure Laterality Date  . Kidney transplant      History of renal transplant maintained on chronic immunosuppressive therapy  . Av fistula placement      Right arm  . Peripherally inserted central catheter insertion      line placement  . Amputation    . Gastrostomy    . 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 Brain biopsy    . #6 shiley cuff      Less trache    Allergies: Amoxicillin; Ampicillin; Latex; Morphine and related; Penicillins; and Tape  Medications: Prior to Admission medications   Medication  Sig Start Date End Date Taking? Authorizing Provider  allopurinol (ZYLOPRIM) 100 MG tablet Take 100 mg by mouth daily.     Yes Historical Provider, MD  aspirin 81 MG tablet Take 81 mg by mouth daily.     Yes Historical Provider, MD  atorvastatin (LIPITOR) 10 MG tablet Take 10 mg by mouth daily.   Yes Historical Provider, MD  citalopram (CELEXA) 10 MG tablet Take 20 mg by mouth daily.    Yes Historical Provider, MD  Cranberry 425 MG CAPS Take 425 mg by mouth daily.   Yes Historical Provider, MD  Dextromethorphan-Quinidine 20-10 MG CAPS Take 1 capsule by mouth every 12 (twelve) hours.   Yes Historical Provider, MD  gabapentin (NEURONTIN) 300 MG capsule Take 300 mg by mouth 3 (three) times daily.  05/20/13  Yes Historical Provider, MD  hydrALAZINE (APRESOLINE) 25 MG tablet Take 25 mg by mouth 2 (two) times daily.   Yes Historical Provider, MD  insulin glargine (LANTUS) 100 UNIT/ML injection Inject 0.1 mLs (10 Units total) into the skin at bedtime. Patient taking differently: Inject 12 Units into the skin at bedtime.  10/21/14  Yes Florencia Reasons, MD  insulin lispro (HUMALOG KWIKPEN) 100 UNIT/ML KiwkPen Inject 9 Units into the skin 3 (three) times daily before meals.   Yes Historical Provider, MD  isosorbide dinitrate (ISORDIL) 10 MG tablet Take 10 mg by mouth 2 (two) times daily.   Yes Historical Provider, MD  levETIRAcetam (KEPPRA) 500 MG tablet Take 500 mg by mouth every 12 (twelve) hours.     Yes Historical Provider, MD  omeprazole (PRILOSEC) 20 MG capsule Take 20 mg by mouth daily.     Yes Historical Provider, MD  oxyCODONE-acetaminophen (PERCOCET) 7.5-325 MG per tablet Take 1 tablet by mouth every 4 (four) hours as needed for severe pain.   Yes Historical Provider, MD  potassium & sodium phosphates (PHOS-NAK) 280-160-250 MG PACK Take 1 packet by mouth 4 (four) times daily -  with meals and at bedtime.   Yes Historical Provider, MD  predniSONE (DELTASONE) 5 MG tablet Take 5 mg by mouth daily.    Yes  Historical Provider, MD  senna-docusate (SENOKOT-S) 8.6-50 MG per tablet Take 1 tablet by mouth daily.   Yes Historical Provider, MD  tacrolimus (PROGRAF) 0.5 MG capsule Take 0.5 mg by mouth 2 (two) times daily.   Yes Historical Provider, MD  tacrolimus (PROGRAF) 1 MG capsule Take 3 mg by mouth 2 (two) times daily.   Yes Historical Provider, MD  tiZANidine (ZANAFLEX) 2 MG tablet Take 2 mg by mouth 2 (two) times daily.  05/26/13  Yes Historical Provider, MD  fentaNYL (DURAGESIC - DOSED MCG/HR) 25 MCG/HR patch Place 25 mcg onto the skin every 3 (three) days.    Historical Provider, MD  insulin aspart (NOVOLOG) 100 UNIT/ML injection Inject 9 Units into the skin 3 (three) times daily before meals.     Historical Provider, MD  LORazepam (ATIVAN) 0.5 MG tablet Take 0.5 mg by mouth every 8 (eight) hours as needed. Anxiety    Historical Provider, MD  LORazepam (ATIVAN) 2 MG/ML injection Inject 1 mg into the vein every 6 (six) hours as needed for anxiety.    Historical Provider, MD  Vitamin D, Ergocalciferol, (DRISDOL) 50000 UNITS CAPS Take 50,000 Units by mouth every 30 (thirty) days.    Historical Provider, MD     Family History  Problem Relation Age of Onset  . Heart disease Mother     Social History   Social History  . Marital Status: Single    Spouse Name: N/A  . Number of Children: 2  . Years of Education: N/A   Occupational History  . Disabled    Social History Main Topics  . Smoking status: Former Smoker    Quit date: 01/06/2001  . Smokeless tobacco: None  . Alcohol Use: No  . Drug Use: Yes     Comment: Has had a previous history of Cocaine Abuse  . Sexual Activity: Not Asked   Other Topics Concern  . None   Social History Narrative   Presently living at the nursing home.   He is a full code.     Review of Systems: A 12 point ROS discussed and pertinent positives are indicated in the HPI above.  All other systems are negative.  Review of Systems  Constitutional:  Negative for fever.  Respiratory: Negative for shortness of breath.   Neurological: Positive for weakness.  Psychiatric/Behavioral: Positive for behavioral problems and confusion.    Vital Signs: BP 144/66 mmHg  Pulse 72  Temp(Src) 98.2 F (36.8 C) (Oral)  Resp 19  Ht 6\' 1"  (1.854 m)  Wt 183 lb 1.6 oz (83.054 kg)  BMI 24.16 kg/m2  SpO2 100%  Physical Exam  Constitutional:  Non verbal; non communicative  Cardiovascular: Normal  rate and regular rhythm.   Pulmonary/Chest: Effort normal and breath sounds normal.  Abdominal: Soft. He exhibits no distension.  Musculoskeletal:  Moves Lt side some None on Rt  Neurological:  Awake Does not follow commands   Skin: Skin is warm and dry.  Psychiatric:  Dtr consented via phone  Nursing note and vitals reviewed.   Mallampati Score:  MD Evaluation Airway: WNL Heart: WNL Abdomen: WNL Chest/ Lungs: WNL ASA  Classification: 3 Mallampati/Airway Score:  (unable to assess---does not follow commands)  Imaging: Ct Abdomen Wo Contrast  03/03/2015   CLINICAL DATA:  Planned percutaneous gastrostomy placement  EXAM: CT ABDOMEN WITHOUT CONTRAST  TECHNIQUE: Multidetector CT imaging of the abdomen was performed following the standard protocol without IV contrast.  COMPARISON:  04/15/2010  FINDINGS: Lower chest: Presumed nasogastric tube in place with tip in the mid stomach. The tract is visualized traversing the abdominal wall to the mid stomach image 28. Small left pleural effusion with presumed associated compressive atelectasis. Moderate bronchial wall thickening. Superimposed areas of left lower lobe airspace opacity with internal air bronchogram formation are noted. Right lower lobe pulmonary nodule measures 5 mm. This is either new since the prior exam or potentially previously not included in the anatomic field of view.  Hepatobiliary: Unenhanced liver is grossly normal. Sludge and/or stones within the contracted gallbladder. Gallbladder  wall thickness subjectively at upper limits of normal but imaging is degraded by motion at this level.  Pancreas: Grossly normal  Spleen: Normal. 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 This is not significantly changed.  Adrenals/Urinary Tract: Bilateral renal cortical atrophy with bilateral renal vascular and collecting system calcifications. Largest left lower renal pole calculus measures 7 mm image 42. Left upper ureteral calculus measures 6 mm image 41 without hydroureteronephrosis. 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  Stomach/Bowel: Normal.  Normal appendix.  Vascular/Lymphatic: Extensive atheromatous vascular calcification. IVC filter in place. No lymphadenopathy.  Other: Injection granulomas noted within the buttocks. Areas of scattered trabecular rarefaction likely indicate osteopenia in the absence of any known malignancy which could account for lytic osseous metastatic disease. No free air or ascites.  Musculoskeletal: No acute osseous abnormality.  IMPRESSION: Chronic findings as above, including left upper ureteral calculus. No new acute intra-abdominal finding.   Electronically Signed   By: Conchita Paris M.D.   On: 03/03/2015 17:19   Ct Head Wo Contrast  03/01/2015   CLINICAL DATA:  Altered mental status.  EXAM: CT HEAD WITHOUT CONTRAST  TECHNIQUE: Contiguous axial images were obtained from the base of the skull through the vertex without intravenous contrast.  COMPARISON:  CT 06/15/2013  FINDINGS: Extensive low-density and volume loss throughout the left cerebral hemisphere and into the left thalamus and basal ganglia, is stable ex vacuo ventricular enlargement of the left lateral ventricle. Generalized premature atrophy throughout the cerebral hemispheres and cerebellum. No acute infarct or hemorrhage. No mass effect or midline shift. No hydrocephalus.  IMPRESSION: Extensive low-density throughout the left cerebral  hemisphere and extending into the left thalamus and basal ganglia, likely related to prior infarct.  Age advanced atrophy.  No acute intracranial abnormality.   Electronically Signed   By: Rolm Baptise M.D.   On: 03/01/2015 11:45   US Renal  02/27/2015   CLINICAL DATA:  Acute renal insufficiency.  EXAM: RENAL / URINARY TRACT ULTRASOUND COMPLETE  COMPARISON:  None.  FINDINGS: Right Kidney:  Length: 9.5 cm. New echogenicity and thickness of the right renal  cortex is within normal limits without mass or cyst. There is right-sided pelviectasis, perhaps mild hydronephrosis. No renal stones seen.  Left Kidney:  Left kidney is not visualized within its expected location or within the left lower quadrant/pelvis.  Bladder:  Appears normal for degree of bladder distention.  IMPRESSION: Right-sided pelviectasis, perhaps mild hydronephrosis. Right kidney otherwise unremarkable.  Left kidney not seen which may indicate solitary right kidney. Could consider CT or nuclear medicine renal scan to evaluate for location of a possible second kidney.   Electronically Signed   By: Franki Cabot M.D.   On: 02/27/2015 06:55   Dg Chest Port 1 View  03/01/2015   CLINICAL DATA:  Nasogastric tube placement  EXAM: PORTABLE CHEST - 1 VIEW  COMPARISON:  Portable exam 1725 hours compared to 02/26/2015  FINDINGS: Nasogastric tube extends into stomach.  Rotated to the LEFT.  Upper normal heart size.  Mediastinal contours and pulmonary vascularity normal.  Atelectasis versus consolidation in LEFT lower lobe.  Remaining lungs clear.  Bones demineralized.  IMPRESSION: Nasogastric tube extends into stomach.  LEFT lower lobe atelectasis versus consolidation.   Electronically Signed   By: Lavonia Dana M.D.   On: 03/01/2015 17:32   Dg Chest Port 1 View  02/26/2015   CLINICAL DATA:  Fever.  EXAM: PORTABLE CHEST - 1 VIEW  COMPARISON:  10/18/2014  FINDINGS: Shallow inspiration with slight elevation of the left hemidiaphragm. Infiltration or  atelectasis in the left lung base. No blunting of costophrenic angles. No pneumothorax. Normal heart size and pulmonary vascularity. Appearance is similar to prior study.  IMPRESSION: Shallow inspiration with infiltration or atelectasis in the left lung base.   Electronically Signed   By: Lucienne Capers M.D.   On: 02/26/2015 01:24    Labs:  CBC:  Recent Labs  02/27/15 1017 02/28/15 0302 03/01/15 0426 03/02/15 0313  WBC 4.5 4.3 4.3 4.4  HGB 10.2* 13.4 13.7 13.6  HCT 34.5* 45.5 46.5 45.3  PLT 92* 118* 141* 129*    COAGS:  Recent Labs  02/26/15 0810  INR 1.21  APTT 29    BMP:  Recent Labs  02/28/15 0302 03/01/15 0426 03/02/15 0313 03/03/15 0942  NA 145 144 145 144  K 4.9 4.9 4.7 4.4  CL 115* 112* 112* 112*  CO2 25 28 26 27   GLUCOSE 146* 290* 146* 118*  BUN 25* 24* 20 19  CALCIUM 10.0 9.9 9.7 9.8  CREATININE 1.48* 1.61* 1.58* 1.56*  GFRNONAA 49* 45* 46* 46*  GFRAA 57* 52* 53* 54*    LIVER FUNCTION TESTS:  Recent Labs  10/18/14 2030 10/20/14 0455 02/26/15 0025 03/02/15 0313  BILITOT 1.6* 1.0 0.4 0.7  AST 48* 32 32 24  ALT 20 16 22 17   ALKPHOS 84 75 90 73  PROT 8.0 7.0 7.2 6.8  ALBUMIN 2.9* 2.5* 2.4* 2.0*    TUMOR MARKERS: No results for input(s): AFPTM, CEA, CA199, CHROMGRNA in the last 8760 hours.  Assessment and Plan:  Dysphagia Malnutrition Long term care Non communicative pt Scheduled for percutaneous gastric tube placement in IR Risks and Benefits discussed with the patient's dtr including, but not limited to the need for a barium enema during the procedure, bleeding, infection, peritonitis, or damage to adjacent structures. All of her questions were answered, she is agreeable to proceed. Consent signed and in chart.   Thank you for this interesting consult.  I greatly enjoyed meeting KINSEY COWSERT and look forward to participating in their care.  A  copy of this report was sent to the requesting provider on this  date.  Signed: Ranen Doolin A 03/04/2015, 12:40 PM   I spent a total of 40 Minutes    in face to face in clinical consultation, greater than 50% of which was counseling/coordinating care for perc gastric tube

## 2015-03-04 NOTE — Progress Notes (Signed)
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   Assessment/Plan: Sepsis likely due to Escherichia coli UTI: She was started on the sepsis pathway, will continue IV Rocephin stop date 03/05/2015.  Acute on chronic kidney disease Status post kidney transplant: Baseline creatinine 1.4- 1.7. Creatinine returned to baseline with IV hydration.  Dementia/encephalopathy: CT of the head showed no acute changes. IR was consulted for possible PEG tube placement due to inconsistency of taking her meds Continue meds through NG.  Essential hypertension: Continue current regimen.  Uncontrolled diabetes mellitus: Hemoglobin A1c was 7.9 continue long-acting insulin per sliding scale.  Ulcer of his groin area and penis: - Denuded skin with minimal bleeding. - consult wound care, consul urology.  Code Status: Full Code Family Communication: d/w Faith Disposition Plan: SNF in am   Consultants:  Urology  IR  Procedures:  CT abd and pelvis  Antibiotics:  Rocephin 8.31.2016  HPI/Subjective: Non verbal.  Objective: Filed Vitals:   03/03/15 0900 03/03/15 1400 03/03/15 2030 03/04/15 0408  BP:  117/50 139/65 144/66  Pulse:  73 76 72  Temp:  98.1 F (36.7 C) 98.1 F (36.7 C) 98.2 F (36.8 C)  TempSrc:  Axillary Oral Oral  Resp:  18 18 19   Height:      Weight: 83.054 kg (183 lb 1.6 oz)     SpO2:  100% 100% 100%    Intake/Output Summary (Last 24 hours) at 03/04/15 1039 Last data filed at 03/04/15 0732  Gross per 24 hour  Intake      0 ml  Output   1200 ml  Net  -1200 ml   Filed Weights   02/26/15 0010 02/26/15 0511 03/03/15 0900  Weight: 82.101 kg (181 lb) 77.157 kg (170 lb 1.6 oz) 83.054 kg (183 lb 1.6 oz)    Exam:  General:  Alert, awake, oriented x3, in no acute distress.  HEENT: No bruits, no goiter.  Heart: Regular rate and rhythm. Lungs: Good air movement, Clear Abdomen: Soft, nontender, nondistended, positive bowel sounds.  Neuro: Grossly intact, nonfocal.   Data Reviewed: Basic Metabolic Panel:  Recent Labs Lab 02/27/15 1017 02/28/15 0302 03/01/15 0426 03/02/15 0313 03/03/15 0942  NA 147* 145 144 145 144  K 3.0* 4.9 4.9 4.7 4.4  CL 125* 115* 112* 112* 112*  CO2 19* 25 28 26 27   GLUCOSE 189* 146* 290* 146* 118*  BUN 19 25* 24* 20 19  CREATININE 1.17 1.48* 1.61* 1.58* 1.56*  CALCIUM 6.4* 10.0 9.9 9.7 9.8   Liver Function Tests:  Recent Labs Lab 02/26/15 0025 03/02/15 0313  AST 32 24  ALT 22 17  ALKPHOS 90 73  BILITOT 0.4 0.7  PROT 7.2 6.8  ALBUMIN 2.4* 2.0*   No results for input(s): LIPASE, AMYLASE in the last 168 hours. No results for input(s): AMMONIA in the last 168 hours. CBC:  Recent Labs Lab 02/26/15 0025 02/26/15 0810 02/27/15 1017 02/28/15 0302 03/01/15 0426 03/02/15 0313  WBC 5.3 4.8 4.5 4.3 4.3 4.4  NEUTROABS 3.7  --   --   --   --   --   HGB 14.3 13.0 10.2* 13.4 13.7 13.6  HCT 47.9 44.2 34.5* 45.5 46.5 45.3  MCV 91.6 91.5 92.5 91.2 91.9 90.2  PLT 135* 94* 92* 118* 141* 129*   Cardiac Enzymes:  Recent Labs Lab 02/26/15 0810 02/26/15 1604 02/26/15 2025  TROPONINI 0.08* 0.03 0.05*   BNP (last 3 results) No results for input(s): BNP in the last 8760 hours.  ProBNP (last 3 results) No results for input(s): PROBNP in the last 8760 hours.  CBG:  Recent Labs Lab 2015/03/21 1851 2015/03/21 2027 03/04/15 0020 03/04/15 0405 03/04/15 0828  GLUCAP 128* 126* 129* 149* 162*    Recent Results (from the past 240 hour(s))  Culture, blood (routine x 2)     Status: None   Collection Time: 02/26/15 12:25 AM  Result Value Ref Range Status   Specimen Description BLOOD LEFT ARM  Final   Special Requests   Final    BOTTLES DRAWN AEROBIC AND ANAEROBIC 10CCBLUE  3CC RED   Culture NO GROWTH 5 DAYS  Final   Report Status 2015-03-21 FINAL  Final  Culture, blood (routine x 2)     Status: None   Collection Time: 02/26/15 12:37 AM  Result Value Ref Range Status   Specimen Description BLOOD LEFT WRIST  Final   Special Requests BOTTLES DRAWN AEROBIC AND ANAEROBIC 10CC  Final   Culture NO GROWTH 5 DAYS  Final   Report Status 03-21-15 FINAL  Final  Urine culture     Status: None   Collection Time: 02/26/15  1:05 AM  Result Value Ref Range Status   Specimen Description URINE, CATHETERIZED  Final   Special Requests NONE  Final   Culture >=100,000 COLONIES/mL ESCHERICHIA COLI  Final   Report Status 02/28/2015 FINAL  Final   Organism ID, Bacteria ESCHERICHIA COLI  Final      Susceptibility   Escherichia coli - MIC*    AMPICILLIN >=32 RESISTANT Resistant     CEFAZOLIN <=4 SENSITIVE Sensitive     CEFTRIAXONE <=1 SENSITIVE Sensitive     CIPROFLOXACIN >=4 RESISTANT Resistant     GENTAMICIN >=16 RESISTANT Resistant     IMIPENEM <=0.25 SENSITIVE Sensitive     NITROFURANTOIN <=16 SENSITIVE Sensitive     TRIMETH/SULFA <=20 SENSITIVE Sensitive     AMPICILLIN/SULBACTAM 16 INTERMEDIATE Intermediate     PIP/TAZO <=4 SENSITIVE Sensitive     * >=100,000 COLONIES/mL ESCHERICHIA COLI  MRSA PCR Screening     Status: Abnormal   Collection Time: 02/26/15 11:33 AM  Result Value Ref Range Status   MRSA by PCR POSITIVE (A) NEGATIVE Final    Comment:        The GeneXpert MRSA Assay (FDA approved for NASAL specimens only), is one component of a comprehensive MRSA colonization surveillance program. It is not intended to diagnose MRSA infection nor to guide or monitor treatment for MRSA infections. RESULT CALLED TO, READ BACK BY AND VERIFIED WITH: KUFFAUR RN 13:15 02/26/15 ([email or web address])      Studies: Ct Abdomen Wo Contrast  03/21/15   CLINICAL DATA:  Planned percutaneous gastrostomy placement  EXAM: CT ABDOMEN WITHOUT CONTRAST  TECHNIQUE: Multidetector CT imaging  of the abdomen was performed following the standard protocol without IV contrast.  COMPARISON:  04/15/2010  FINDINGS: Lower chest: Presumed nasogastric tube in place with tip in the mid stomach. The tract is visualized traversing the abdominal wall to the mid stomach image 28. Small left pleural effusion with presumed associated compressive atelectasis. Moderate bronchial wall thickening. Superimposed areas of left lower lobe airspace opacity with internal air bronchogram formation are noted. Right lower lobe pulmonary nodule measures 5 mm. This is either new since the prior exam or potentially previously not included in the anatomic field  of view.  Hepatobiliary: Unenhanced liver is grossly normal. Sludge and/or stones within the contracted gallbladder. Gallbladder wall thickness subjectively at upper limits of normal but imaging is degraded by motion at this level.  Pancreas: Grossly normal  Spleen: Normal. 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 This is not significantly changed.  Adrenals/Urinary Tract: Bilateral renal cortical atrophy with bilateral renal vascular and collecting system calcifications. Largest left lower renal pole calculus measures 7 mm image 42. Left upper ureteral calculus measures 6 mm image 41 without hydroureteronephrosis. 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  Stomach/Bowel: Normal.  Normal appendix.  Vascular/Lymphatic: Extensive atheromatous vascular calcification. IVC filter in place. No lymphadenopathy.  Other: Injection granulomas noted within the buttocks. Areas of scattered trabecular rarefaction likely indicate osteopenia in the absence of any known malignancy which could account for lytic osseous metastatic disease. No free air or ascites.  Musculoskeletal: No acute osseous abnormality.  IMPRESSION: Chronic findings as above, including left upper ureteral calculus. No new acute intra-abdominal finding.    Electronically Signed   By: Conchita Paris M.D.   On: 03/03/2015 17:19    Scheduled Meds: . allopurinol  100 mg Oral Daily  . antiseptic oral rinse  7 mL Mouth Rinse q12n4p  . aspirin  81 mg Oral Daily  . atorvastatin  10 mg Oral q1800  . baclofen  5 mg Oral TID  . cefTRIAXone (ROCEPHIN)  IV  1 g Intravenous Q24H  . chlorhexidine  15 mL Mouth Rinse BID  . citalopram  20 mg Oral Daily  . fentaNYL  12.5 mcg Transdermal Q72H  . gabapentin  100 mg Oral TID  . heparin  5,000 Units Subcutaneous 3 times per day  . insulin aspart  0-9 Units Subcutaneous 6 times per day  . insulin glargine  15 Units Subcutaneous QHS  . isosorbide dinitrate  10 mg Oral BID  . levETIRAcetam  500 mg Intravenous Q12H  . multivitamin with minerals  1 tablet Oral Daily  . mupirocin ointment  1 application Nasal BID  . neomycin-bacitracin-polymyxin   Topical BID  . pantoprazole sodium  40 mg Oral Daily  . predniSONE  5 mg Oral QAC breakfast  . senna-docusate  1 tablet Oral Daily  . sodium chloride  3 mL Intravenous Q12H  . tacrolimus  3.5 mg Oral BID   Continuous Infusions: . sodium chloride 50 mL/hr at 03/03/15 0231  . feeding supplement (GLUCERNA 1.2 CAL) 1,000 mL (03/04/15 0009)    Time Spent: 25 min   FELIZ Marguarite Arbour  [facility] Pager [phone].  If 7PM-7AM, please contact night-coverage at [email or web address], password [facility] 03/04/2015, 10:39 AM  LOS: 6 days

## 2015-03-04 NOTE — Consult Note (Signed)
WOC consult requested for penis; this was already performed on  8/25.  There are multiple red raised vesicles, some are dry crusted and yellow. Appearance is consistent with probable genital herpes; this is beyond [facility] scope of practice. Please consult ID for further plan of care if desired.  Please re-consult if further assistance is needed.  Thank-you,  Julien Girt MSN, [place], [place], [place], [place]

## 2015-03-04 NOTE — Progress Notes (Signed)
Speech Language Pathology Treatment: Dysphagia  Patient Details Name: Justin [name] MRN: [id number] DOB: Dec 31, 1955 Today's Date: 03/04/2015 Time: 5183-4373 SLP Time Calculation (min) (ACUTE ONLY): 12 min  Assessment / Plan / Recommendation Clinical Impression  Pt has had very limited oral intake.  Decision made by family to proceed with PEG, which is scheduled for next date, 9/1.  Pt discovered with NG having been pulled from nose and TF covering bed and floor; RN informed.  Pt provided with purees - accepted bolus, but held material orally with no attempt to manipulate materials, despite max verbal/tactile cues.  Purees ultimately suctioned from oral cavity.  Recommend continuing to allow dysphagia 1, nectar-thick liquids s/p PEG if pt expresses a desire to eat.  Recommend SLP f/u at SNF to ensure precautions are in place, POs are resumed.  No further acute care SLP needs are identified - will sign off.    HPI Other Pertinent Information: Justin [name] is a 59 y.o. male, SNF resident, with PMH of kidney transplantation, CKD-III, hypertension, hyperlipidemia, diabetes mellitus, GERD, gout, depression, ICH with right-sided paralysis, nonverbal, GI bleeding, PVD, who presents with fever.  CXR showed shallow inspiration with infiltration or atelectasis in the left lung base. MBS on 4/19 recommends Dys 1/nectar due to aspiration of thin liquids with delayed sensation.    Pertinent Vitals Pain Assessment: Faces Faces Pain Scale: No hurt  SLP Plan  Discharge SLP treatment due to (comment)    Recommendations Diet recommendations: Dysphagia 1 (puree);Nectar-thick liquid Liquids provided via: Cup;Teaspoon Medication Administration: Via alternative means Supervision: Staff to assist with self feeding Compensations: Slow rate;Small sips/bites Postural Changes and/or Swallow Maneuvers: Seated upright 90 degrees              Oral Care Recommendations: Oral care BID Follow up Recommendations:  Skilled Nursing facility Plan: Discharge SLP treatment due to no progress toward goals   Justin [name], [place] CCC/SLP Pager [phone]      Justin [name] 03/04/2015, 2:55 PM

## 2015-03-04 NOTE — Consult Note (Signed)
Urology Consult  Consulting IO:[id number]  CC: Penile/groin wound  HPI: This is a10 year old male With right hemiplegia, with a history of an intercranial hemorrhage. He is a resident of the skilled nursing facility. He is nonverbal. He has a history of chronic renal insufficiency and is status post a renal transplant. The patient was admitted for sepsis secondary to urinary tract infection. Patient was noted to have a wound in his inguinal/penile region, with no known length of the presence of this wound. Urologic consultation is requested.  Unfortunately, the patient is nonverbal and cannot communicate how long that this area has been present. According to the nurses in the room, the skin was foul-smelling and exudative at the time of his admission several days ago. According to them, this has improved in its appearance.  PMH: Past Medical History  Diagnosis Date  . Hypertension   . Dyslipidemia   . Carpal tunnel syndrome   . A-V fistula   . Anemia   . Hemorrhoids   . Hyperparathyroidism   . Intracranial hemorrhage   . Hypotension   . Hyperkalemia   . Kidney disease   . Sepsis(995.91)   . GI bleeding   . DVT (deep venous thrombosis)   . PVD (peripheral vascular disease)   . DM (diabetes mellitus)   . Cellulitis   . IV infiltration     Right upper extremity  . PPD positive   . 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 Hypokinesis     EF of 60% with mild anterior hypokinesis, minimal coronary artery disease on catheterization in December of 2007  . Gout     PSH: Past Surgical History  Procedure Laterality Date  . Kidney transplant      History of renal transplant maintained on chronic immunosuppressive therapy  . Av fistula placement      Right arm  . Peripherally inserted central catheter insertion      line placement  . Amputation    . Gastrostomy    . 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 Brain biopsy    . #6  shiley cuff      Less trache    Allergies: Allergies  Allergen Reactions  . Amoxicillin Other (See Comments)    ON MAR  . Ampicillin Other (See Comments)    ON MAR  . Latex Other (See Comments)    ON MAR  . Morphine And Related Other (See Comments)    UNK  . Penicillins Other (See Comments)    ON MAR  . Tape Other (See Comments)    ON MAR    Medications: Prescriptions prior to admission  Medication Sig Dispense Refill Last Dose  . allopurinol (ZYLOPRIM) 100 MG tablet Take 100 mg by mouth daily.     02/25/2015 at Unknown time  . aspirin 81 MG tablet Take 81 mg by mouth daily.     02/25/2015 at Unknown time  . atorvastatin (LIPITOR) 10 MG tablet Take 10 mg by mouth daily.   02/25/2015 at Unknown time  . citalopram (CELEXA) 10 MG tablet Take 20 mg by mouth daily.    02/25/2015 at Unknown time  . Cranberry 425 MG CAPS Take 425 mg by mouth daily.   02/25/2015 at Unknown time  . Dextromethorphan-Quinidine 20-10 MG CAPS Take 1 capsule by mouth every 12 (twelve) hours.   02/25/2015 at Unknown time  . gabapentin (NEURONTIN) 300 MG  capsule Take 300 mg by mouth 3 (three) times daily.    02/25/2015 at Unknown time  . hydrALAZINE (APRESOLINE) 25 MG tablet Take 25 mg by mouth 2 (two) times daily.   02/25/2015 at Unknown time  . insulin glargine (LANTUS) 100 UNIT/ML injection Inject 0.1 mLs (10 Units total) into the skin at bedtime. (Patient taking differently: Inject 12 Units into the skin at bedtime. ) 10 mL 11 02/25/2015 at Unknown time  . insulin lispro (HUMALOG KWIKPEN) 100 UNIT/ML KiwkPen Inject 9 Units into the skin 3 (three) times daily before meals.   02/25/2015 at Unknown time  . isosorbide dinitrate (ISORDIL) 10 MG tablet Take 10 mg by mouth 2 (two) times daily.   02/25/2015 at Unknown time  . levETIRAcetam (KEPPRA) 500 MG tablet Take 500 mg by mouth every 12 (twelve) hours.     02/25/2015 at Unknown time  . omeprazole (PRILOSEC) 20 MG capsule Take 20 mg by mouth daily.     02/25/2015 at Unknown  time  . oxyCODONE-acetaminophen (PERCOCET) 7.5-325 MG per tablet Take 1 tablet by mouth every 4 (four) hours as needed for severe pain.   02/23/2015  . potassium & sodium phosphates (PHOS-NAK) 280-160-250 MG PACK Take 1 packet by mouth 4 (four) times daily -  with meals and at bedtime.   02/25/2015 at Unknown time  . predniSONE (DELTASONE) 5 MG tablet Take 5 mg by mouth daily.    02/25/2015 at Unknown time  . senna-docusate (SENOKOT-S) 8.6-50 MG per tablet Take 1 tablet by mouth daily.   02/25/2015 at Unknown time  . tacrolimus (PROGRAF) 0.5 MG capsule Take 0.5 mg by mouth 2 (two) times daily.   02/25/2015 at Unknown time  . tacrolimus (PROGRAF) 1 MG capsule Take 3 mg by mouth 2 (two) times daily.   02/25/2015 at Unknown time  . tiZANidine (ZANAFLEX) 2 MG tablet Take 2 mg by mouth 2 (two) times daily.    02/25/2015 at Unknown time  . fentaNYL (DURAGESIC - DOSED MCG/HR) 25 MCG/HR patch Place 25 mcg onto the skin every 3 (three) days.   02/23/2015  . insulin aspart (NOVOLOG) 100 UNIT/ML injection Inject 9 Units into the skin 3 (three) times daily before meals.    Not Taking at Unknown time  . LORazepam (ATIVAN) 0.5 MG tablet Take 0.5 mg by mouth every 8 (eight) hours as needed. Anxiety   unknown  . LORazepam (ATIVAN) 2 MG/ML injection Inject 1 mg into the vein every 6 (six) hours as needed for anxiety.   unknown  . Vitamin D, Ergocalciferol, (DRISDOL) 50000 UNITS CAPS Take 50,000 Units by mouth every 30 (thirty) days.   02/14/2015     Social History: Social History   Social History  . Marital Status: Single    Spouse Name: N/A  . Number of Children: 2  . Years of Education: N/A   Occupational History  . Disabled    Social History Main Topics  . Smoking status: Former Smoker    Quit date: 01/06/2001  . Smokeless tobacco: Not on file  . Alcohol Use: No  . Drug Use: Yes     Comment: Has had a previous history of Cocaine Abuse  . Sexual Activity: Not on file   Other Topics Concern  . Not on  file   Social History Narrative   Presently living at the nursing home.   He is a full code.    Family History: Family History  Problem Relation Age of Onset  . Heart  disease Mother     Review of Systems: Unable to assess secondary to the patient's nonverbal status   Physical Exam: @VITALS2 @ General: No acute distress.  Awake. Head:  Normocephalic.  Atraumatic. ENT:  EOMI.  Mucous membranes moist Neck:  Supple.  No lymphadenopathy. CV:                   normal rate. Abdomen: Soft.   non- palpation. Skin:  Normal turgor.  No visible rash. Extremity: No gross deformity of bilateral upper extremities.  No gross deformity of    bilateral lower extremities.   Penis:  circumcised. Foley is present.   there is a large lesion in the suprapubic/infrapubic region approximately 5 x 10 cm in size. This looks like a confluence of condylomata.  Urethra: Foley catheter in place.  Orthotopic meatus. Scrotum: No lesions.  No ecchymosis.  No erythema. Testicles: Descended bilaterally.  No masses bilaterally. Epididymis: Palpable bilaterally.  Non Tender to palpation.  Studies:  Recent Labs     03/02/15  0313  HGB  13.6  WBC  4.4  PLT  129*    Recent Labs     03/02/15  0313  03/03/15  0942  NA  145  144  K  4.7  4.4  CL  112*  112*  CO2  26  27  BUN  20  19  CREATININE  1.58*  1.56*  CALCIUM  9.7  9.8  GFRNONAA  46*  46*  GFRAA  53*  54*     No results for input(s): INR, APTT in the last 72 hours.  Invalid input(s): PT   Invalid input(s): ABG   I reviewed the patient's CT images. He has a long-standing left upper ureteral stone and a nonfunctioning/atrophic left kidney without hydronephrosis. This was present 5 years ago on CT scan and looks similar in appearance now to then.  Assessment:  large infrapubic skin abnormality-to me, this looks like a confluence of condylomata.   Plan:  1. At this point, from a urologic standpoint I don't think that there is an acute  process going on. I would suggest wound care to this area. Unless this area enlarges/encroaches on his penis, I don't think that anything other than conservative skin management is necessary  2. As this is a large area, and may have a risk of neoplasia, I would suggest dermatologic consult. Pager:[phone]

## 2015-03-05 ENCOUNTER — Inpatient Hospital Stay (HOSPITAL_COMMUNITY): Payer: Medicare Other

## 2015-03-05 LAB — GLUCOSE, CAPILLARY
GLUCOSE-CAPILLARY: 70 mg/dL (ref 65–99)
GLUCOSE-CAPILLARY: 106 mg/dL — AB (ref 65–99)
GLUCOSE-CAPILLARY: 130 mg/dL — AB (ref 65–99)
Glucose-Capillary: 110 mg/dL — ABNORMAL HIGH (ref 65–99)
Glucose-Capillary: 154 mg/dL — ABNORMAL HIGH (ref 65–99)
Glucose-Capillary: 171 mg/dL — ABNORMAL HIGH (ref 65–99)
Glucose-Capillary: 93 mg/dL (ref 65–99)

## 2015-03-05 LAB — CBC
HCT: 40.2 % (ref 39.0–52.0)
Hemoglobin: 12.2 g/dL — ABNORMAL LOW (ref 13.0–17.0)
MCH: 27.1 pg (ref 26.0–34.0)
MCHC: 30.3 g/dL (ref 30.0–36.0)
MCV: 89.1 fL (ref 78.0–100.0)
PLATELETS: DECREASED 10*3/uL (ref 150–400)
RBC: 4.51 MIL/uL (ref 4.22–5.81)
RDW: 14.4 % (ref 11.5–15.5)
WBC: 3.5 10*3/uL — ABNORMAL LOW (ref 4.0–10.5)

## 2015-03-05 LAB — PROTIME-INR
INR: 0.98 (ref 0.00–1.49)
PROTHROMBIN TIME: 13.2 s (ref 11.6–15.2)

## 2015-03-05 MED ORDER — GLUCAGON HCL RDNA (DIAGNOSTIC) 1 MG IJ SOLR
INTRAMUSCULAR | Status: AC | PRN
  Administered 2015-03-05: 1 mg via INTRAVENOUS

## 2015-03-05 MED ORDER — VANCOMYCIN HCL IN DEXTROSE 1-5 GM/200ML-% IV SOLN
1000.0000 mg | INTRAVENOUS | Status: AC
  Administered 2015-03-05: 1000 mg via INTRAVENOUS
  Filled 2015-03-05: qty 200

## 2015-03-05 MED ORDER — MIDAZOLAM HCL 2 MG/2ML IJ SOLN
INTRAMUSCULAR | Status: AC
  Filled 2015-03-05: qty 2

## 2015-03-05 MED ORDER — FENTANYL CITRATE (PF) 100 MCG/2ML IJ SOLN
INTRAMUSCULAR | Status: AC | PRN
  Administered 2015-03-05: 25 ug via INTRAVENOUS
  Administered 2015-03-05: 50 ug via INTRAVENOUS

## 2015-03-05 MED ORDER — DEXTROSE 50 % IV SOLN
INTRAVENOUS | Status: AC
  Administered 2015-03-05: 25 mL
  Filled 2015-03-05: qty 50

## 2015-03-05 MED ORDER — IOHEXOL 300 MG/ML  SOLN
50.0000 mL | Freq: Once | INTRAMUSCULAR | Status: DC | PRN
  Administered 2015-03-05: 20 mL via INTRAVENOUS
  Filled 2015-03-05: qty 50

## 2015-03-05 MED ORDER — GLUCAGON HCL RDNA (DIAGNOSTIC) 1 MG IJ SOLR
INTRAMUSCULAR | Status: AC
  Filled 2015-03-05: qty 1

## 2015-03-05 MED ORDER — MIDAZOLAM HCL 2 MG/2ML IJ SOLN
INTRAMUSCULAR | Status: AC | PRN
  Administered 2015-03-05: 1 mg via INTRAVENOUS
  Administered 2015-03-05: 0.5 mg via INTRAVENOUS

## 2015-03-05 MED ORDER — LIDOCAINE HCL 1 % IJ SOLN
INTRAMUSCULAR | Status: AC
  Filled 2015-03-05: qty 20

## 2015-03-05 MED ORDER — FENTANYL CITRATE (PF) 100 MCG/2ML IJ SOLN
INTRAMUSCULAR | Status: AC
  Filled 2015-03-05: qty 2

## 2015-03-05 NOTE — Care Management Important Message (Signed)
Important Message  Patient Details  Name: Justin [name] MRN: [id number] Date of Birth: [birth year]   Medicare Important Message Given:  Yes-third notification given    Dawayne Patricia, RN 03/05/2015, 11:38 AM

## 2015-03-05 NOTE — Progress Notes (Signed)
Utilization review completed.  

## 2015-03-05 NOTE — Procedures (Signed)
Successful fluoroscopic guided insertion of gastrostomy tube.   The gastrostomy tube may be used immediately for medications.   Tube feeds may be initiated in 24 hours as per the primary team.   No immediate post procedural complications.   Jay Idil Maslanka, MD Pager #: [phone]  

## 2015-03-05 NOTE — Progress Notes (Signed)
Hypoglycemic Event  CBG: 70  Treatment:1/2 ampule Dextrose 50%  Symptoms: none CBG Result: Notified Radiology RN who agreed to repeat when patient arrives for G-tube.  Possible Reasons for Event: NPO  Justin [name]  Remember to initiate Hypoglycemia Order Set & complete

## 2015-03-05 NOTE — Progress Notes (Addendum)
Nutrition Follow-up/Consult  DOCUMENTATION CODES:   Not applicable  INTERVENTION:   Once PEG tube able to be used, re-initiate Glucerna 1.2 formula at 20 ml/hr and increase by 10 ml every 4 hours to goal rate of 80 ml/hr   TF regimen to provide 2304 kcals, 115 gm protein,1545 ml of free water  NUTRITION DIAGNOSIS:   Increased nutrient needs related to wound healing as evidenced by estimated needs, ongoing  GOAL:   Patient will meet greater than or equal to 90% of their needs, currently unmet  MONITOR:   TF tolerance, PO intake, Labs, Weight trends, Skin, I & O's  ASSESSMENT:   59 y.o. Male with PMH of kidney transplantation, CKD-III, hypertension, hyperlipidemia, diabetes mellitus, GERD, gout, depression, ICH with right-sided paralysis, nonverbal, GI bleeding, PVD, who presented with fever.  Initial nutrition assessment completed 8/29.  Pt pulled out NGT.  Plan for PEG tube placement today.  NPO for procedure.  Previously on a Dys 1, nectar thick liquid diet with poor PO intake.  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   Diet Order:  Diet NPO time specified  Skin:  Wound (see comment) (full thickness rt elbow, open lt leg, st II lt sacrum)  Last BM:  8/30  Height:   Ht Readings from Last 1 Encounters:  02/26/15 6\' 1"  (1.854 m)    Weight:   Wt Readings from Last 1 Encounters:  03/03/15 183 lb 1.6 oz (83.054 kg)    Ideal Body Weight:  83.6 kg  BMI:  Body mass index is 24.16 kg/(m^2).  Estimated Nutritional Needs:   Kcal:  7858-8502  Protein:  105-115 grams  Fluid:  >2.3 L  EDUCATION NEEDS:   No education needs identified at this time  Arthur Holms, RD, LDN Pager #: [phone] After-Hours Pager #: [phone]

## 2015-03-05 NOTE — Care Management Note (Addendum)
Case Management Note Marvetta Gibbons RN, BSN Unit 2W-Case Manager [phone]  Patient Details  Name: Justin [name] MRN: [id number] Date of Birth: 06-Nov-1955  Subjective/Objective:     Pt admitted with sepsis               Action/Plan: PTA pt lived at [facility] consulted for return to SNF when medically stable  Expected Discharge Date:     03/06/15             Expected Discharge Plan:  Skilled Nursing [facility]  In-House Referral:  Clinical Social Work  Discharge planning Services  CM Consult  Post Acute Care Choice:    Choice offered to:     DME Arranged:    DME Agency:     HH Arranged:    [place] Agency:     Status of Service:  Completed  Medicare Important Message Given:  Yes-second notification given Date Medicare IM Given:    Medicare IM give by:    Date Additional Medicare IM Given:    Additional Medicare Important Message give by:     If discussed at [place] of Stay Meetings, dates discussed:  03/03/15, 03/05/15  Additional Comments:  03/05/15- plan for placement of PEG tube in IR today- will return to SNF when medically ready post PEG placement.  Dawayne Patricia, RN 03/05/2015, 11:32 AM

## 2015-03-05 NOTE — Sedation Documentation (Signed)
Repeat glucose 106

## 2015-03-05 NOTE — Progress Notes (Addendum)
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   Assessment/Plan: Sepsis likely due to Escherichia coli UTI: He completed his course of IV antibiotics in house.  Acute on chronic kidney disease Status post kidney transplant: Creatinine at baseline   Dementia/encephalopathy: CT of the head showed no acute changes. IR was consulted for possible PEG tube placement on 03/05/2015 we'll have to monitor for 24 hours with PEG tube. Continue meds through NG.  Essential hypertension: Continue current regimen.  Uncontrolled diabetes mellitus: Hemoglobin A1c was 7.9 continue long-acting insulin per sliding scale.  Ulcer of his groin area and penis: - Denuded skin with minimal bleeding. Urology was consulted, who recommended a follow-up with dermatology as an outpatient.  Code Status: Full Code Family Communication: d/w [place] Disposition Plan: SNF in am   Consultants:  Urology  IR  Procedures:  CT abd and pelvis  Antibiotics:  Rocephin 8.31.2016  HPI/Subjective: Non verbal.  Objective: Filed Vitals:   03/03/15 2030 03/04/15 0408 03/04/15 2132 03/05/15 0409  BP: 139/65 144/66 172/71 96/41  Pulse: 76 72 66 57  Temp: 98.1 F (36.7 C) 98.2 F (36.8 C) 98.4 F (36.9 C) 97.9 F (36.6 C)  TempSrc: Oral Oral Oral Oral  Resp: 18 19 17 16   Height:      Weight:      SpO2: 100% 100% 100% 100%    Intake/Output Summary (Last 24 hours) at 03/05/15 1053 Last data filed at 03/05/15 0947  Gross per 24 hour  Intake    520 ml  Output   2400 ml  Net  -1880 ml   Filed Weights   02/26/15 0010 02/26/15 0511 03/03/15 0900  Weight: 82.101 kg (181 lb) 77.157 kg (170 lb 1.6 oz) 83.054 kg (183 lb 1.6 oz)    Exam:  General:  in no acute  distress.  HEENT: No bruits, no goiter.  Heart: Regular rate and rhythm. Lungs: Good air movement, Clear Abdomen: Soft, nontender, nondistended, positive bowel sounds.  Neuro: Grossly intact, nonfocal.   Data Reviewed: Basic Metabolic Panel:  Recent Labs Lab 02/27/15 1017 02/28/15 0302 03/01/15 0426 03/02/15 0313 03/03/15 0942  NA 147* 145 144 145 144  K 3.0* 4.9 4.9 4.7 4.4  CL 125* 115* 112* 112* 112*  CO2 19* 25 28 26 27   GLUCOSE 189* 146* 290* 146* 118*  BUN 19 25* 24* 20 19  CREATININE 1.17 1.48* 1.61* 1.58* 1.56*  CALCIUM 6.4* 10.0 9.9 9.7 9.8   Liver Function Tests:  Recent Labs Lab 03/02/15 0313  AST 24  ALT 17  ALKPHOS 73  BILITOT 0.7  PROT 6.8  ALBUMIN 2.0*   No results for input(s): LIPASE, AMYLASE in the last 168 hours. No results for input(s): AMMONIA in the last 168 hours. CBC:  Recent Labs Lab 02/27/15 1017 02/28/15 0302 03/01/15 0426 03/02/15 0313 03/05/15 0426  WBC 4.5 4.3 4.3 4.4 3.5*  HGB 10.2* 13.4 13.7 13.6 12.2*  HCT 34.5* 45.5 46.5 45.3 40.2  MCV 92.5 91.2 91.9 90.2 89.1  PLT 92* 118* 141* 129* PLATELET CLUMPS NOTED ON SMEAR, COUNT APPEARS DECREASED   Cardiac Enzymes:  Recent Labs Lab 02/26/15 1604 02/26/15 2025  TROPONINI 0.03 0.05*   BNP (last 3 results) No results for input(s): BNP in the last 8760 hours.  ProBNP (last 3 results) No  results for input(s): PROBNP in the last 8760 hours.  CBG:  Recent Labs Lab 03/04/15 1709 03/04/15 2127 03/05/15 0017 03/05/15 0406 03/05/15 0840  GLUCAP 213* 214* 171* 154* 110*    Recent Results (from the past 240 hour(s))  Culture, blood (routine x 2)     Status: None   Collection Time: 02/26/15 12:25 AM  Result Value Ref Range Status   Specimen Description BLOOD LEFT ARM  Final   Special Requests   Final    BOTTLES DRAWN AEROBIC AND ANAEROBIC 10CCBLUE 3CC RED   Culture NO GROWTH 5 DAYS  Final   Report Status March 30, 2015 FINAL  Final  Culture, blood (routine x 2)      Status: None   Collection Time: 02/26/15 12:37 AM  Result Value Ref Range Status   Specimen Description BLOOD LEFT WRIST  Final   Special Requests BOTTLES DRAWN AEROBIC AND ANAEROBIC 10CC  Final   Culture NO GROWTH 5 DAYS  Final   Report Status 2015-03-30 FINAL  Final  Urine culture     Status: None   Collection Time: 02/26/15  1:05 AM  Result Value Ref Range Status   Specimen Description URINE, CATHETERIZED  Final   Special Requests NONE  Final   Culture >=100,000 COLONIES/mL ESCHERICHIA COLI  Final   Report Status 02/28/2015 FINAL  Final   Organism ID, Bacteria ESCHERICHIA COLI  Final      Susceptibility   Escherichia coli - MIC*    AMPICILLIN >=32 RESISTANT Resistant     CEFAZOLIN <=4 SENSITIVE Sensitive     CEFTRIAXONE <=1 SENSITIVE Sensitive     CIPROFLOXACIN >=4 RESISTANT Resistant     GENTAMICIN >=16 RESISTANT Resistant     IMIPENEM <=0.25 SENSITIVE Sensitive     NITROFURANTOIN <=16 SENSITIVE Sensitive     TRIMETH/SULFA <=20 SENSITIVE Sensitive     AMPICILLIN/SULBACTAM 16 INTERMEDIATE Intermediate     PIP/TAZO <=4 SENSITIVE Sensitive     * >=100,000 COLONIES/mL ESCHERICHIA COLI  MRSA PCR Screening     Status: Abnormal   Collection Time: 02/26/15 11:33 AM  Result Value Ref Range Status   MRSA by PCR POSITIVE (A) NEGATIVE Final    Comment:        The GeneXpert MRSA Assay (FDA approved for NASAL specimens only), is one component of a comprehensive MRSA colonization surveillance program. It is not intended to diagnose MRSA infection nor to guide or monitor treatment for MRSA infections. RESULT CALLED TO, READ BACK BY AND VERIFIED WITH: KUFFAUR RN 13:15 02/26/15 ([email or web address])      Studies: Ct Abdomen Wo Contrast  03/30/2015   CLINICAL DATA:  Planned percutaneous gastrostomy placement  EXAM: CT ABDOMEN WITHOUT CONTRAST  TECHNIQUE: Multidetector CT imaging of the abdomen was performed following the standard protocol without IV contrast.  COMPARISON:  04/15/2010  FINDINGS:  Lower chest: Presumed nasogastric tube in place with tip in the mid stomach. The tract is visualized traversing the abdominal wall to the mid stomach image 28. Small left pleural effusion with presumed associated compressive atelectasis. Moderate bronchial wall thickening. Superimposed areas of left lower lobe airspace opacity with internal air bronchogram formation are noted. Right lower lobe pulmonary nodule measures 5 mm. This is either new since the prior exam or potentially previously not included in the anatomic field of view.  Hepatobiliary: Unenhanced liver is grossly normal. Sludge and/or stones within the contracted gallbladder. Gallbladder wall thickness subjectively at upper limits of normal but imaging is degraded by motion at this level.  Pancreas:  Grossly normal  Spleen: Normal. 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 This is not significantly changed.  Adrenals/Urinary Tract: Bilateral renal cortical atrophy with bilateral renal vascular and collecting system calcifications. Largest left lower renal pole calculus measures 7 mm image 42. Left upper ureteral calculus measures 6 mm image 41 without hydroureteronephrosis. 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  Stomach/Bowel: Normal.  Normal appendix.  Vascular/Lymphatic: Extensive atheromatous vascular calcification. IVC filter in place. No lymphadenopathy.  Other: Injection granulomas noted within the buttocks. Areas of scattered trabecular rarefaction likely indicate osteopenia in the absence of any known malignancy which could account for lytic osseous metastatic disease. No free air or ascites.  Musculoskeletal: No acute osseous abnormality.  IMPRESSION: Chronic findings as above, including left upper ureteral calculus. No new acute intra-abdominal finding.   Electronically Signed   By: Conchita Paris M.D.   On: 03/03/2015 17:19    Scheduled Meds: . allopurinol  100 mg  Oral Daily  . antiseptic oral rinse  7 mL Mouth Rinse q12n4p  . aspirin  81 mg Oral Daily  . atorvastatin  10 mg Oral q1800  . baclofen  5 mg Oral TID  . cefTRIAXone (ROCEPHIN)  IV  1 g Intravenous Q24H  . chlorhexidine  15 mL Mouth Rinse BID  . citalopram  20 mg Oral Daily  . fentaNYL  12.5 mcg Transdermal Q72H  . free water  200 mL Per Tube 3 times per day  . gabapentin  100 mg Oral TID  . heparin  5,000 Units Subcutaneous 3 times per day  . insulin aspart  0-9 Units Subcutaneous 6 times per day  . insulin glargine  15 Units Subcutaneous QHS  . isosorbide dinitrate  10 mg Oral BID  . levETIRAcetam  500 mg Intravenous Q12H  . multivitamin with minerals  1 tablet Oral Daily  . mupirocin ointment  1 application Nasal BID  . neomycin-bacitracin-polymyxin   Topical BID  . pantoprazole sodium  40 mg Oral Daily  . predniSONE  5 mg Oral QAC breakfast  . senna-docusate  1 tablet Oral Daily  . sodium chloride  3 mL Intravenous Q12H  . tacrolimus  3.5 mg Oral BID   Continuous Infusions: . sodium chloride 50 mL/hr at 03/04/15 1103  . feeding supplement (GLUCERNA 1.2 CAL) 1,000 mL (03/04/15 0009)    Time Spent: 15 min   FELIZ Marguarite Arbour  [facility] Pager [phone].  If 7PM-7AM, please contact night-coverage at [email or web address], password [facility] 03/05/2015, 10:53 AM  LOS: 7 days

## 2015-03-06 DIAGNOSIS — I629 Nontraumatic intracranial hemorrhage, unspecified: Secondary | ICD-10-CM

## 2015-03-06 DIAGNOSIS — N390 Urinary tract infection, site not specified: Secondary | ICD-10-CM

## 2015-03-06 DIAGNOSIS — A4151 Sepsis due to Escherichia coli [E. coli]: Principal | ICD-10-CM

## 2015-03-06 LAB — GLUCOSE, RANDOM: GLUCOSE: 115 mg/dL — AB (ref 65–99)

## 2015-03-06 LAB — GLUCOSE, CAPILLARY
GLUCOSE-CAPILLARY: 44 mg/dL — AB (ref 65–99)
GLUCOSE-CAPILLARY: 48 mg/dL — AB (ref 65–99)
GLUCOSE-CAPILLARY: 55 mg/dL — AB (ref 65–99)
GLUCOSE-CAPILLARY: 70 mg/dL (ref 65–99)
GLUCOSE-CAPILLARY: 92 mg/dL (ref 65–99)
Glucose-Capillary: 120 mg/dL — ABNORMAL HIGH (ref 65–99)
Glucose-Capillary: 77 mg/dL (ref 65–99)
Glucose-Capillary: 72 mg/dL (ref 65–99)

## 2015-03-06 MED ORDER — GLUCERNA 1.2 CAL PO LIQD: 1000.0000 mL | ORAL | Status: DC

## 2015-03-06 MED ORDER — LEVETIRACETAM 100 MG/ML PO SOLN
500.0000 mg | Freq: Two times a day (BID) | ORAL | Status: DC
  Filled 2015-03-06 (×3): qty 5

## 2015-03-06 MED ORDER — FREE WATER: 200.0000 mL | Freq: Three times a day (TID) | Status: DC

## 2015-03-06 MED ORDER — LEVETIRACETAM 100 MG/ML PO SOLN: 500.0000 mg | Freq: Two times a day (BID) | ORAL | Status: AC

## 2015-03-06 MED ORDER — DEXTROSE 50 % IV SOLN
INTRAVENOUS | Status: AC
  Administered 2015-03-06: 25 mL
  Filled 2015-03-06: qty 50

## 2015-03-06 MED ORDER — OXYCODONE-ACETAMINOPHEN 7.5-325 MG PO TABS: 1.0000 | ORAL_TABLET | ORAL | Status: DC | PRN

## 2015-03-06 NOTE — Progress Notes (Signed)
Referring Physician(s): [facility]/Feliz-Ortiz  Chief Complaint:  dysphagia  Subjective:  Percutaneous gastric tube placed 9/1 in IR Looks good   Allergies: Amoxicillin; Ampicillin; Latex; Morphine and related; Penicillins; and Tape  Medications: Prior to Admission medications   Medication Sig Start Date End Date Taking? Authorizing Provider  allopurinol (ZYLOPRIM) 100 MG tablet Take 100 mg by mouth daily.     Yes Historical Provider, MD  aspirin 81 MG tablet Take 81 mg by mouth daily.     Yes Historical Provider, MD  atorvastatin (LIPITOR) 10 MG tablet Take 10 mg by mouth daily.   Yes Historical Provider, MD  citalopram (CELEXA) 10 MG tablet Take 20 mg by mouth daily.    Yes Historical Provider, MD  Cranberry 425 MG CAPS Take 425 mg by mouth daily.   Yes Historical Provider, MD  Dextromethorphan-Quinidine 20-10 MG CAPS Take 1 capsule by mouth every 12 (twelve) hours.   Yes Historical Provider, MD  gabapentin (NEURONTIN) 300 MG capsule Take 300 mg by mouth 3 (three) times daily.  05/20/13  Yes Historical Provider, MD  hydrALAZINE (APRESOLINE) 25 MG tablet Take 25 mg by mouth 2 (two) times daily.   Yes Historical Provider, MD  insulin glargine (LANTUS) 100 UNIT/ML injection Inject 0.1 mLs (10 Units total) into the skin at bedtime. Patient taking differently: Inject 12 Units into the skin at bedtime.  10/21/14  Yes Florencia Reasons, MD  insulin lispro (HUMALOG KWIKPEN) 100 UNIT/ML KiwkPen Inject 9 Units into the skin 3 (three) times daily before meals.   Yes Historical Provider, MD  isosorbide dinitrate (ISORDIL) 10 MG tablet Take 10 mg by mouth 2 (two) times daily.   Yes Historical Provider, MD  levETIRAcetam (KEPPRA) 500 MG tablet Take 500 mg by mouth every 12 (twelve) hours.     Yes Historical Provider, MD  omeprazole (PRILOSEC) 20 MG capsule Take 20 mg by mouth daily.     Yes Historical Provider, MD  oxyCODONE-acetaminophen (PERCOCET) 7.5-325 MG per tablet Take 1 tablet by mouth every 4  (four) hours as needed for severe pain.   Yes Historical Provider, MD  potassium & sodium phosphates (PHOS-NAK) 280-160-250 MG PACK Take 1 packet by mouth 4 (four) times daily -  with meals and at bedtime.   Yes Historical Provider, MD  predniSONE (DELTASONE) 5 MG tablet Take 5 mg by mouth daily.    Yes Historical Provider, MD  senna-docusate (SENOKOT-S) 8.6-50 MG per tablet Take 1 tablet by mouth daily.   Yes Historical Provider, MD  tacrolimus (PROGRAF) 0.5 MG capsule Take 0.5 mg by mouth 2 (two) times daily.   Yes Historical Provider, MD  tacrolimus (PROGRAF) 1 MG capsule Take 3 mg by mouth 2 (two) times daily.   Yes Historical Provider, MD  tiZANidine (ZANAFLEX) 2 MG tablet Take 2 mg by mouth 2 (two) times daily.  05/26/13  Yes Historical Provider, MD  fentaNYL (DURAGESIC - DOSED MCG/HR) 25 MCG/HR patch Place 25 mcg onto the skin every 3 (three) days.    Historical Provider, MD  insulin aspart (NOVOLOG) 100 UNIT/ML injection Inject 9 Units into the skin 3 (three) times daily before meals.     Historical Provider, MD  LORazepam (ATIVAN) 0.5 MG tablet Take 0.5 mg by mouth every 8 (eight) hours as needed. Anxiety    Historical Provider, MD  LORazepam (ATIVAN) 2 MG/ML injection Inject 1 mg into the vein every 6 (six) hours as needed for anxiety.    Historical Provider, MD  Vitamin D, Ergocalciferol, (  DRISDOL) 50000 UNITS CAPS Take 50,000 Units by mouth every 30 (thirty) days.    Historical Provider, MD     Vital Signs: BP 130/64 mmHg  Pulse 62  Temp(Src) 97.8 F (36.6 C) (Oral)  Resp 18  Ht 6\' 1"  (1.854 m)  Wt 180 lb 8 oz (81.874 kg)  BMI 23.82 kg/m2  SpO2 100%  Physical Exam  Abdominal: Soft. Bowel sounds are normal.  Site of G tube is clean and dry NT no bleeding  Nursing note and vitals reviewed.   Imaging: Ct Abdomen Wo Contrast  03/03/2015   CLINICAL DATA:  Planned percutaneous gastrostomy placement  EXAM: CT ABDOMEN WITHOUT CONTRAST  TECHNIQUE: Multidetector CT imaging of  the abdomen was performed following the standard protocol without IV contrast.  COMPARISON:  04/15/2010  FINDINGS: Lower chest: Presumed nasogastric tube in place with tip in the mid stomach. The tract is visualized traversing the abdominal wall to the mid stomach image 28. Small left pleural effusion with presumed associated compressive atelectasis. Moderate bronchial wall thickening. Superimposed areas of left lower lobe airspace opacity with internal air bronchogram formation are noted. Right lower lobe pulmonary nodule measures 5 mm. This is either new since the prior exam or potentially previously not included in the anatomic field of view.  Hepatobiliary: Unenhanced liver is grossly normal. Sludge and/or stones within the contracted gallbladder. Gallbladder wall thickness subjectively at upper limits of normal but imaging is degraded by motion at this level.  Pancreas: Grossly normal  Spleen: Normal. 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 This is not significantly changed.  Adrenals/Urinary Tract: Bilateral renal cortical atrophy with bilateral renal vascular and collecting system calcifications. Largest left lower renal pole calculus measures 7 mm image 42. Left upper ureteral calculus measures 6 mm image 41 without hydroureteronephrosis. 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  Stomach/Bowel: Normal.  Normal appendix.  Vascular/Lymphatic: Extensive atheromatous vascular calcification. IVC filter in place. No lymphadenopathy.  Other: Injection granulomas noted within the buttocks. Areas of scattered trabecular rarefaction likely indicate osteopenia in the absence of any known malignancy which could account for lytic osseous metastatic disease. No free air or ascites.  Musculoskeletal: No acute osseous abnormality.  IMPRESSION: Chronic findings as above, including left upper ureteral calculus. No new acute intra-abdominal finding.    Electronically Signed   By: Conchita Paris M.D.   On: 03/03/2015 17:19   Ir Gastrostomy Tube Mod Sed  03/05/2015   INDICATION: History of intracranial hemorrhage admitted with UTI and sepsis. Patient has history of prior gastrostomy tube placement several years ago. Patient currently failed a swallow study and as such request made for placement of a new percutaneous gastrostomy tube placement.  EXAM: PULL TROUGH GASTROSTOMY TUBE PLACEMENT  COMPARISON:  CT abdomen and pelvis -03/03/2015  MEDICATIONS: Vancomycin 1 g IV; Antibiotics were administered within 1 hour of the procedure.  Glucagon 1 mg IV  CONTRAST:  20 mL of Omnipaque 300 administered into the gastric lumen.  ANESTHESIA/SEDATION: Versed 1.5 mg IV; Fentanyl 275 mcg IV  Sedation time  15 minutes  FLUOROSCOPY TIME:  2 minutes 36 seconds  COMPLICATIONS: None immediate  PROCEDURE: Informed written consent was obtained from the patient's family following explanation of the procedure, risks, benefits and alternatives. A time out was performed prior to the initiation of the procedure. Ultrasound scanning was performed to demarcate the edge of the left lobe of the liver. Maximal barrier sterile technique utilized including caps, mask, sterile gowns, sterile  gloves, large sterile drape, hand hygiene and Betadine prep.  The left upper quadrant was sterilely prepped and draped. An oral gastric catheter was inserted into the stomach under fluoroscopy. The existing nasogastric feeding tube was removed. The left costal margin and air opacified transverse colon were identified and avoided. Air was injected into the stomach for insufflation and visualization under fluoroscopy. Under sterile conditions a 17 gauge trocar needle was utilized to access the stomach percutaneously beneath the left subcostal margin just lateral to the site of the prior gastrostomy tube track after the overlying soft tissues were anesthetized with 1% Lidocaine with epinephrine. Needle position  was confirmed within the stomach with aspiration of air and injection of small amount of contrast. A single T tack was deployed for gastropexy. Over an Amplatz guide wire, a 9-French sheath was inserted into the stomach. A snare device was utilized to capture the oral gastric catheter. The snare device was pulled retrograde from the stomach up the esophagus and out the oropharynx. The 20-French pull-through gastrostomy was connected to the snare device and pulled antegrade through the oropharynx down the esophagus into the stomach and then through the percutaneous tract external to the patient. The gastrostomy was assembled externally. Contrast injection confirms position in the stomach. Several spot radiographic images were obtained in various obliquities for documentation. The patient tolerated procedure well without immediate post procedural complication.  FINDINGS: After successful fluoroscopic guided placement, the gastrostomy tube is appropriately positioned with internal disc against the ventral aspect of the gastric lumen.  IMPRESSION: Successful fluoroscopic insertion of a 20-French pull-through gastrostomy tube.  The gastrostomy may be used immediately for medication administration and in 24 hrs for the initiation of feeds.   Electronically Signed   By: Sandi Mariscal M.D.   On: 03/05/2015 15:33    Labs:  CBC:  Recent Labs  02/28/15 0302 03/01/15 0426 03/02/15 0313 03/05/15 0426  WBC 4.3 4.3 4.4 3.5*  HGB 13.4 13.7 13.6 12.2*  HCT 45.5 46.5 45.3 40.2  PLT 118* 141* 129* PLATELET CLUMPS NOTED ON SMEAR, COUNT APPEARS DECREASED    COAGS:  Recent Labs  02/26/15 0810 03/05/15 0426  INR 1.21 0.98  APTT 29  --     BMP:  Recent Labs  02/28/15 0302 03/01/15 0426 03/02/15 0313 03/03/15 0942  NA 145 144 145 144  K 4.9 4.9 4.7 4.4  CL 115* 112* 112* 112*  CO2 25 28 26 27   GLUCOSE 146* 290* 146* 118*  BUN 25* 24* 20 19  CALCIUM 10.0 9.9 9.7 9.8  CREATININE 1.48* 1.61* 1.58*  1.56*  GFRNONAA 49* 45* 46* 46*  GFRAA 57* 52* 53* 54*    LIVER FUNCTION TESTS:  Recent Labs  10/18/14 2030 10/20/14 0455 02/26/15 0025 03/02/15 0313  BILITOT 1.6* 1.0 0.4 0.7  AST 48* 32 32 24  ALT 20 16 22 17   ALKPHOS 84 75 90 73  PROT 8.0 7.0 7.2 6.8  ALBUMIN 2.9* 2.5* 2.4* 2.0*    Assessment and Plan:  G tube intact May use now  Signed: Reet Scharrer A 03/06/2015, 8:07 AM   I spent a total of 15 Minutes at the the patient's bedside AND on the patient's hospital floor or unit, greater than 50% of which was counseling/coordinating care for Perc G tube

## 2015-03-06 NOTE — Progress Notes (Signed)
Discharge Note:  Patient picked up by EMS to be transported back to [facility].  Upon discharge patient's mental status at baseline. Alert and making incomprehensible sounds and nodding appropriately in response to questions. Following simple commands. Vital Signs upon discharge: BP 130/64, HR 62, Temp 97.8, RR 18, CBG 72.  Peripheral IV discontinued.  No order to discontinue foley catheter which was left in place.  G-Tube flushed with 200 cc of sterile water and clamped.  Spoke with Margreta Journey at [facility] and informed her of patient's discharge CBG of 72 and advised that CBG be monitored closely.

## 2015-03-06 NOTE — Progress Notes (Addendum)
Inpatient Diabetes Program Recommendations  AACE/ADA: New Consensus Statement on Inpatient Glycemic Control (2013)  Target Ranges:  Prepandial:   less than 140 mg/dL      Peak postprandial:   less than 180 mg/dL (1-2 hours)      Critically ill patients:  140 - 180 mg/dL    Inpatient Diabetes Program Recommendations Insulin - Basal: Renal function elevated, Basal given last pm when glucose was in the 50's. Patient held on to the basal insulin. Glucose in the 40's this am. Please decrease basal insulin dose. RN please tell patient or facility to monitor themselves for hypoglycemia for the rest of the morning and tell them to check glucose more often.  Thanks,  Tama Headings RN, MSN, [facility] Inpatient Diabetes Coordinator Team Pager [phone]

## 2015-03-06 NOTE — Progress Notes (Signed)
Followup on pt--process involving infrapubic and penile/scrotal area looks cleaner. Ne real exudate.  I feel process is out of my scope of management. 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 I would suggest Dermatology or Plastic consult--this may need bx/topical therapy or perhaps excision and grafting.

## 2015-03-06 NOTE — Progress Notes (Signed)
Patient will discharge to [facility] Anticipated discharge date:03/06/15 Family notified: left messages for both daughters to inform of transfer Transportation by [facility]- scheduled for 12:30pm  CSW signing off.  Domenica Reamer, [place] Social Worker [phone]

## 2015-03-06 NOTE — Progress Notes (Signed)
Pt's cbg is now 120.  Will monitor closely.

## 2015-03-06 NOTE — Progress Notes (Signed)
Pt's cbg is 44 this am. Hypoglycemia protocol ordered.  !/2 amp of d50 given.  To recheck in 15 mins.

## 2015-03-06 NOTE — Discharge Summary (Signed)
Physician Discharge Summary  Justin [name] DVV:[id number] DOB: [birth year] DOA: 02/25/2015  PCP: Garwin Brothers, MD  Admit date: 02/25/2015 Discharge date: 03/06/2015  Time spent: 35 minutes  Recommendations for Outpatient Follow-up:  1. Palliative to follow at [place] 2. Will go to skilled nursing facility [facility]. 3. Check a basic metabolic panel in a week. 4. Follow-up with dermatology as an outpatient for data dated skin of the genital area.  Discharge Diagnoses:  Principal Problem:   Sepsis Active Problems:   Diabetes mellitus   Hyperlipidemia   Essential hypertension   Intracranial hemorrhage   Peripheral vascular disease   Renal transplant, status post   UTI (lower urinary tract infection)   Acute renal failure superimposed on stage 3 chronic kidney disease   Fever   Gout   GERD (gastroesophageal reflux disease)   Depression   Discharge Condition: Guarded  Diet recommendation:NPO  Filed Weights   02/26/15 0511 03/03/15 0900 03/06/15 0429  Weight: 77.157 kg (170 lb 1.6 oz) 83.054 kg (183 lb 1.6 oz) 81.874 kg (180 lb 8 oz)    History of present illness:  59 year old with past [name] history of a transplant, essential hypertension, diabetes mellitus type 2, intracranial hemorrhage with right-sided paralysis that comes in with fever.  Hospital Course:  Sepsis likely due to Escherichia coli UTI: He was started on empiric antibiotics and sepsis pathway on admission. Urine culture grew Escherichia coli sensitive to Rocephin which he completed his treatment in house.  Acute on chronic kidney disease: Likely due to sepsis is resolved with IV hydration creatinine returned to baseline. Baseline creatinine 1.4-1.7.  Dementia without behavioral disturbances/acute encephalopathy: CT of the head showed no acute findings on admission. Due to his confusion his baclofen was stopped as well has tizanidine. His worsening encephalopathy was probably due to infectious  etiology. Due to his dysphagia a PEG tube was recommended as patient was taking meds is consistently. It was discussed with the family. IR was consulted to place a percutaneous PEG tube on 03/05/2015.  Essential hypertension: Continue current regimen.  Uncontrolled diabetes mellitus with complications: His hemoglobin A1c was 7.9 due to his dementia the goal would be less than 8.0. Continue current regimen.  Ulcerative the groin area and penis: The skin was denuded dated with minimal bleeding, urology was consulted who recommended a follow-up as an outpatient with dermatology.     Procedures:  CT abdomen and pelvis  Percutaneous gastric tube insertion on 03/05/2015.   Consultations:  Urology  Interventional radiology  Discharge Exam: Filed Vitals:   03/06/15 0429  BP: 130/64  Pulse: 62  Temp: 97.8 F (36.6 C)  Resp: 18    General: In no acute distress Cardiovascular: Regular rate and rhythm Respiratory: Good air movement clear to auscultation  Discharge Instructions   Discharge Instructions    Diet - low sodium heart healthy    Complete by:  As directed      Increase activity slowly    Complete by:  As directed           Current Discharge Medication List    START taking these medications   Details  levETIRAcetam (KEPPRA) 100 MG/ML solution Place 5 mLs (500 mg total) into feeding tube 2 (two) times daily. Qty: 473 mL, Refills: 12    Nutritional Supplements (FEEDING SUPPLEMENT, GLUCERNA 1.2 CAL,) LIQD Place 1,000 mLs into feeding tube continuous.    Water For Irrigation, Sterile (FREE WATER) SOLN Place 200 mLs into feeding tube every 8 (eight) hours.  CONTINUE these medications which have CHANGED   Details  oxyCODONE-acetaminophen (PERCOCET) 7.5-325 MG per tablet Take 1 tablet by mouth every 4 (four) hours as needed for severe pain. Qty: 30 tablet, Refills: 0      CONTINUE these medications which have NOT CHANGED   Details  allopurinol  (ZYLOPRIM) 100 MG tablet Take 100 mg by mouth daily.      aspirin 81 MG tablet Take 81 mg by mouth daily.      atorvastatin (LIPITOR) 10 MG tablet Take 10 mg by mouth daily.    citalopram (CELEXA) 10 MG tablet Take 20 mg by mouth daily.     Cranberry 425 MG CAPS Take 425 mg by mouth daily.    Dextromethorphan-Quinidine 20-10 MG CAPS Take 1 capsule by mouth every 12 (twelve) hours.    gabapentin (NEURONTIN) 300 MG capsule Take 300 mg by mouth 3 (three) times daily.     hydrALAZINE (APRESOLINE) 25 MG tablet Take 25 mg by mouth 2 (two) times daily.    insulin glargine (LANTUS) 100 UNIT/ML injection Inject 0.1 mLs (10 Units total) into the skin at bedtime. Qty: 10 mL, Refills: 11    insulin lispro (HUMALOG KWIKPEN) 100 UNIT/ML KiwkPen Inject 9 Units into the skin 3 (three) times daily before meals.    isosorbide dinitrate (ISORDIL) 10 MG tablet Take 10 mg by mouth 2 (two) times daily.    omeprazole (PRILOSEC) 20 MG capsule Take 20 mg by mouth daily.      potassium & sodium phosphates (PHOS-NAK) 280-160-250 MG PACK Take 1 packet by mouth 4 (four) times daily -  with meals and at bedtime.    predniSONE (DELTASONE) 5 MG tablet Take 5 mg by mouth daily.     senna-docusate (SENOKOT-S) 8.6-50 MG per tablet Take 1 tablet by mouth daily.    tacrolimus (PROGRAF) 0.5 MG capsule Take 0.5 mg by mouth 2 (two) times daily.    tiZANidine (ZANAFLEX) 2 MG tablet Take 2 mg by mouth 2 (two) times daily.     fentaNYL (DURAGESIC - DOSED MCG/HR) 25 MCG/HR patch Place 25 mcg onto the skin every 3 (three) days.    LORazepam (ATIVAN) 0.5 MG tablet Take 0.5 mg by mouth every 8 (eight) hours as needed. Anxiety    LORazepam (ATIVAN) 2 MG/ML injection Inject 1 mg into the vein every 6 (six) hours as needed for anxiety.    Vitamin D, Ergocalciferol, (DRISDOL) 50000 UNITS CAPS Take 50,000 Units by mouth every 30 (thirty) days.      STOP taking these medications     levETIRAcetam (KEPPRA) 500 MG tablet       insulin aspart (NOVOLOG) 100 UNIT/ML injection      baclofen (LIORESAL) 20 MG tablet      ipratropium-albuterol (DUONEB) 0.5-2.5 (3) MG/3ML SOLN      mupirocin ointment (BACTROBAN) 2 %        Allergies  Allergen Reactions  . Amoxicillin Other (See Comments)    ON MAR  . Ampicillin Other (See Comments)    ON MAR  . Latex Other (See Comments)    ON MAR  . Morphine And Related Other (See Comments)    UNK  . Penicillins Other (See Comments)    ON MAR; tolerated Ceftriaxone 8/27-9/1  . Tape Other (See Comments)    ON MAR   Follow-up Information    Follow up with AMIN, SAAD, MD In 2 weeks.   Specialty:  Internal Medicine   Why:  hospital follow up in  3   Contact information:   [place] [address] [phone]        The results of significant diagnostics from this hospitalization (including imaging, microbiology, ancillary and laboratory) are listed below for reference.    Significant Diagnostic Studies: Ct Abdomen Wo Contrast  13-Mar-2015   CLINICAL DATA:  Planned percutaneous gastrostomy placement  EXAM: CT ABDOMEN WITHOUT CONTRAST  TECHNIQUE: Multidetector CT imaging of the abdomen was performed following the standard protocol without IV contrast.  COMPARISON:  04/15/2010  FINDINGS: Lower chest: Presumed nasogastric tube in place with tip in the mid stomach. The tract is visualized traversing the abdominal wall to the mid stomach image 28. Small left pleural effusion with presumed associated compressive atelectasis. Moderate bronchial wall thickening. Superimposed areas of left lower lobe airspace opacity with internal air bronchogram formation are noted. Right lower lobe pulmonary nodule measures 5 mm. This is either new since the prior exam or potentially previously not included in the anatomic field of view.  Hepatobiliary: Unenhanced liver is grossly normal. Sludge and/or stones within the contracted gallbladder. Gallbladder wall thickness subjectively  at upper limits of normal but imaging is degraded by motion at this level.  Pancreas: Grossly normal  Spleen: Normal. 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 This is not significantly changed.  Adrenals/Urinary Tract: Bilateral renal cortical atrophy with bilateral renal vascular and collecting system calcifications. Largest left lower renal pole calculus measures 7 mm image 42. Left upper ureteral calculus measures 6 mm image 41 without hydroureteronephrosis. 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  Stomach/Bowel: Normal.  Normal appendix.  Vascular/Lymphatic: Extensive atheromatous vascular calcification. IVC filter in place. No lymphadenopathy.  Other: Injection granulomas noted within the buttocks. Areas of scattered trabecular rarefaction likely indicate osteopenia in the absence of any known malignancy which could account for lytic osseous metastatic disease. No free air or ascites.  Musculoskeletal: No acute osseous abnormality.  IMPRESSION: Chronic findings as above, including left upper ureteral calculus. No new acute intra-abdominal finding.   Electronically Signed   By: Conchita Paris M.D.   On: March 13, 2015 17:19   Ct Head Wo Contrast  03/01/2015   CLINICAL DATA:  Altered mental status.  EXAM: CT HEAD WITHOUT CONTRAST  TECHNIQUE: Contiguous axial images were obtained from the base of the skull through the vertex without intravenous contrast.  COMPARISON:  CT 06/15/2013  FINDINGS: Extensive low-density and volume loss throughout the left cerebral hemisphere and into the left thalamus and basal ganglia, is stable ex vacuo ventricular enlargement of the left lateral ventricle. Generalized premature atrophy throughout the cerebral hemispheres and cerebellum. No acute infarct or hemorrhage. No mass effect or midline shift. No hydrocephalus.  IMPRESSION: Extensive low-density throughout the left cerebral hemisphere and extending into  the left thalamus and basal ganglia, likely related to prior infarct.  Age advanced atrophy.  No acute intracranial abnormality.   Electronically Signed   By: Rolm Baptise M.D.   On: 03/01/2015 11:45   Ir Gastrostomy Tube Mod Sed  03/05/2015   INDICATION: History of intracranial hemorrhage admitted with UTI and sepsis. Patient has history of prior gastrostomy tube placement several years ago. Patient currently failed a swallow study and as such request made for placement of a new percutaneous gastrostomy tube placement.  EXAM: PULL TROUGH GASTROSTOMY TUBE PLACEMENT  COMPARISON:  CT abdomen and pelvis -Mar 13, 2015  MEDICATIONS: Vancomycin 1 g IV; Antibiotics were administered within 1 hour of the procedure.  Glucagon 1 mg IV  CONTRAST:  20 mL of Omnipaque 300 administered into the gastric lumen.  ANESTHESIA/SEDATION: Versed 1.5 mg IV; Fentanyl 275 mcg IV  Sedation time  15 minutes  FLUOROSCOPY TIME:  2 minutes 36 seconds  COMPLICATIONS: None immediate  PROCEDURE: Informed written consent was obtained from the patient's family following explanation of the procedure, risks, benefits and alternatives. A time out was performed prior to the initiation of the procedure. Ultrasound scanning was performed to demarcate the edge of the left lobe of the liver. Maximal barrier sterile technique utilized including caps, mask, sterile gowns, sterile gloves, large sterile drape, hand hygiene and Betadine prep.  The left upper quadrant was sterilely prepped and draped. An oral gastric catheter was inserted into the stomach under fluoroscopy. The existing nasogastric feeding tube was removed. The left costal margin and air opacified transverse colon were identified and avoided. Air was injected into the stomach for insufflation and visualization under fluoroscopy. Under sterile conditions a 17 gauge trocar needle was utilized to access the stomach percutaneously beneath the left subcostal margin just lateral to the site of the prior  gastrostomy tube track after the overlying soft tissues were anesthetized with 1% Lidocaine with epinephrine. Needle position was confirmed within the stomach with aspiration of air and injection of small amount of contrast. A single T tack was deployed for gastropexy. Over an Amplatz guide wire, a 9-French sheath was inserted into the stomach. A snare device was utilized to capture the oral gastric catheter. The snare device was pulled retrograde from the stomach up the esophagus and out the oropharynx. The 20-French pull-through gastrostomy was connected to the snare device and pulled antegrade through the oropharynx down the esophagus into the stomach and then through the percutaneous tract external to the patient. The gastrostomy was assembled externally. Contrast injection confirms position in the stomach. Several spot radiographic images were obtained in various obliquities for documentation. The patient tolerated procedure well without immediate post procedural complication.  FINDINGS: After successful fluoroscopic guided placement, the gastrostomy tube is appropriately positioned with internal disc against the ventral aspect of the gastric lumen.  IMPRESSION: Successful fluoroscopic insertion of a 20-French pull-through gastrostomy tube.  The gastrostomy may be used immediately for medication administration and in 24 hrs for the initiation of feeds.   Electronically Signed   By: Sandi Mariscal M.D.   On: 03/05/2015 15:33   US Renal  02/27/2015   CLINICAL DATA:  Acute renal insufficiency.  EXAM: RENAL / URINARY TRACT ULTRASOUND COMPLETE  COMPARISON:  None.  FINDINGS: Right Kidney:  Length: 9.5 cm. New echogenicity and thickness of the right renal cortex is within normal limits without mass or cyst. There is right-sided pelviectasis, perhaps mild hydronephrosis. No renal stones seen.  Left Kidney:  Left kidney is not visualized within its expected location or within the left lower quadrant/pelvis.  Bladder:   Appears normal for degree of bladder distention.  IMPRESSION: Right-sided pelviectasis, perhaps mild hydronephrosis. Right kidney otherwise unremarkable.  Left kidney not seen which may indicate solitary right kidney. Could consider CT or nuclear medicine renal scan to evaluate for location of a possible second kidney.   Electronically Signed   By: Franki Cabot M.D.   On: 02/27/2015 06:55   Dg Chest Port 1 View  03/01/2015   CLINICAL DATA:  Nasogastric tube placement  EXAM: PORTABLE CHEST - 1 VIEW  COMPARISON:  Portable exam 1725 hours compared to 02/26/2015  FINDINGS: Nasogastric tube extends into stomach.  Rotated  to the LEFT.  Upper normal heart size.  Mediastinal contours and pulmonary vascularity normal.  Atelectasis versus consolidation in LEFT lower lobe.  Remaining lungs clear.  Bones demineralized.  IMPRESSION: Nasogastric tube extends into stomach.  LEFT lower lobe atelectasis versus consolidation.   Electronically Signed   By: Lavonia Dana M.D.   On: 03/01/2015 17:32   Dg Chest Port 1 View  02/26/2015   CLINICAL DATA:  Fever.  EXAM: PORTABLE CHEST - 1 VIEW  COMPARISON:  10/18/2014  FINDINGS: Shallow inspiration with slight elevation of the left hemidiaphragm. Infiltration or atelectasis in the left lung base. No blunting of costophrenic angles. No pneumothorax. Normal heart size and pulmonary vascularity. Appearance is similar to prior study.  IMPRESSION: Shallow inspiration with infiltration or atelectasis in the left lung base.   Electronically Signed   By: Lucienne Capers M.D.   On: 02/26/2015 01:24    Microbiology: Recent Results (from the past 240 hour(s))  Culture, blood (routine x 2)     Status: None   Collection Time: 02/26/15 12:25 AM  Result Value Ref Range Status   Specimen Description BLOOD LEFT ARM  Final   Special Requests   Final    BOTTLES DRAWN AEROBIC AND ANAEROBIC 10CCBLUE 3CC RED   Culture NO GROWTH 5 DAYS  Final   Report Status 03/03/2015 FINAL  Final  Culture,  blood (routine x 2)     Status: None   Collection Time: 02/26/15 12:37 AM  Result Value Ref Range Status   Specimen Description BLOOD LEFT WRIST  Final   Special Requests BOTTLES DRAWN AEROBIC AND ANAEROBIC 10CC  Final   Culture NO GROWTH 5 DAYS  Final   Report Status 03/03/2015 FINAL  Final  Urine culture     Status: None   Collection Time: 02/26/15  1:05 AM  Result Value Ref Range Status   Specimen Description URINE, CATHETERIZED  Final   Special Requests NONE  Final   Culture >=100,000 COLONIES/mL ESCHERICHIA COLI  Final   Report Status 02/28/2015 FINAL  Final   Organism ID, Bacteria ESCHERICHIA COLI  Final      Susceptibility   Escherichia coli - MIC*    AMPICILLIN >=32 RESISTANT Resistant     CEFAZOLIN <=4 SENSITIVE Sensitive     CEFTRIAXONE <=1 SENSITIVE Sensitive     CIPROFLOXACIN >=4 RESISTANT Resistant     GENTAMICIN >=16 RESISTANT Resistant     IMIPENEM <=0.25 SENSITIVE Sensitive     NITROFURANTOIN <=16 SENSITIVE Sensitive     TRIMETH/SULFA <=20 SENSITIVE Sensitive     AMPICILLIN/SULBACTAM 16 INTERMEDIATE Intermediate     PIP/TAZO <=4 SENSITIVE Sensitive     * >=100,000 COLONIES/mL ESCHERICHIA COLI  MRSA PCR Screening     Status: Abnormal   Collection Time: 02/26/15 11:33 AM  Result Value Ref Range Status   MRSA by PCR POSITIVE (A) NEGATIVE Final    Comment:        The GeneXpert MRSA Assay (FDA approved for NASAL specimens only), is one component of a comprehensive MRSA colonization surveillance program. It is not intended to diagnose MRSA infection nor to guide or monitor treatment for MRSA infections. RESULT CALLED TO, READ BACK BY AND VERIFIED WITH: KUFFAUR RN 13:15 02/26/15 ([email or web address])      Labs: Basic Metabolic Panel:  Recent Labs Lab 02/27/15 1017 02/28/15 0302 03/01/15 0426 03/02/15 0313 03/03/15 0942  NA 147* 145 144 145 144  K 3.0* 4.9 4.9 4.7 4.4  CL 125* 115* 112* 112* 112*  CO2 19* 25 28 26 27   GLUCOSE 189* 146* 290* 146* 118*  BUN 19  25* 24* 20 19  CREATININE 1.17 1.48* 1.61* 1.58* 1.56*  CALCIUM 6.4* 10.0 9.9 9.7 9.8   Liver Function Tests:  Recent Labs Lab 03/02/15 0313  AST 24  ALT 17  ALKPHOS 73  BILITOT 0.7  PROT 6.8  ALBUMIN 2.0*   No results for input(s): LIPASE, AMYLASE in the last 168 hours. No results for input(s): AMMONIA in the last 168 hours. CBC:  Recent Labs Lab 02/27/15 1017 02/28/15 0302 03/01/15 0426 03/02/15 0313 03/05/15 0426  WBC 4.5 4.3 4.3 4.4 3.5*  HGB 10.2* 13.4 13.7 13.6 12.2*  HCT 34.5* 45.5 46.5 45.3 40.2  MCV 92.5 91.2 91.9 90.2 89.1  PLT 92* 118* 141* 129* PLATELET CLUMPS NOTED ON SMEAR, COUNT APPEARS DECREASED   Cardiac Enzymes: No results for input(s): CKTOTAL, CKMB, CKMBINDEX, TROPONINI in the last 168 hours. BNP: BNP (last 3 results) No results for input(s): BNP in the last 8760 hours.  ProBNP (last 3 results) No results for input(s): PROBNP in the last 8760 hours.  CBG:  Recent Labs Lab 03/06/15 0103 03/06/15 0425 03/06/15 0719 03/06/15 0858 03/06/15 0937  GLUCAP 92 70 48* 44* 120*       Signed:  Charlynne Cousins  [facility] 03/06/2015, 9:59 AM

## 2015-04-05 ENCOUNTER — Inpatient Hospital Stay (HOSPITAL_COMMUNITY)
Admission: EM | Admit: 2015-04-05 | Discharge: 2015-04-10 | DRG: 871 | Disposition: A | Discharge status: Skilled Nursing Facility | Payer: Medicare Other | Attending: Family Medicine | Admitting: Family Medicine

## 2015-04-05 ENCOUNTER — Inpatient Hospital Stay (HOSPITAL_COMMUNITY): Payer: Medicare Other

## 2015-04-05 ENCOUNTER — Emergency Department (HOSPITAL_COMMUNITY): Payer: Medicare Other

## 2015-04-05 ENCOUNTER — Encounter (HOSPITAL_COMMUNITY): Payer: Self-pay | Admitting: *Deleted

## 2015-04-05 DIAGNOSIS — E785 Hyperlipidemia, unspecified: Secondary | ICD-10-CM | POA: Diagnosis present

## 2015-04-05 DIAGNOSIS — I5022 Chronic systolic (congestive) heart failure: Secondary | ICD-10-CM | POA: Diagnosis present

## 2015-04-05 DIAGNOSIS — Z8249 Family history of ischemic heart disease and other diseases of the circulatory system: Secondary | ICD-10-CM

## 2015-04-05 DIAGNOSIS — I10 Essential (primary) hypertension: Secondary | ICD-10-CM | POA: Diagnosis present

## 2015-04-05 DIAGNOSIS — R4702 Dysphasia: Secondary | ICD-10-CM | POA: Diagnosis present

## 2015-04-05 DIAGNOSIS — I959 Hypotension, unspecified: Secondary | ICD-10-CM | POA: Diagnosis present

## 2015-04-05 DIAGNOSIS — Z79891 Long term (current) use of opiate analgesic: Secondary | ICD-10-CM | POA: Diagnosis not present

## 2015-04-05 DIAGNOSIS — G8929 Other chronic pain: Secondary | ICD-10-CM | POA: Diagnosis present

## 2015-04-05 DIAGNOSIS — I82611 Acute embolism and thrombosis of superficial veins of right upper extremity: Secondary | ICD-10-CM | POA: Diagnosis present

## 2015-04-05 DIAGNOSIS — R0600 Dyspnea, unspecified: Secondary | ICD-10-CM | POA: Diagnosis not present

## 2015-04-05 DIAGNOSIS — J9601 Acute respiratory failure with hypoxia: Secondary | ICD-10-CM | POA: Diagnosis present

## 2015-04-05 DIAGNOSIS — Y95 Nosocomial condition: Secondary | ICD-10-CM | POA: Diagnosis present

## 2015-04-05 DIAGNOSIS — Z833 Family history of diabetes mellitus: Secondary | ICD-10-CM

## 2015-04-05 DIAGNOSIS — N183 Chronic kidney disease, stage 3 unspecified: Secondary | ICD-10-CM | POA: Diagnosis present

## 2015-04-05 DIAGNOSIS — I69951 Hemiplegia and hemiparesis following unspecified cerebrovascular disease affecting right dominant side: Secondary | ICD-10-CM

## 2015-04-05 DIAGNOSIS — Z87891 Personal history of nicotine dependence: Secondary | ICD-10-CM | POA: Diagnosis not present

## 2015-04-05 DIAGNOSIS — E870 Hyperosmolality and hypernatremia: Secondary | ICD-10-CM | POA: Diagnosis not present

## 2015-04-05 DIAGNOSIS — Z993 Dependence on wheelchair: Secondary | ICD-10-CM

## 2015-04-05 DIAGNOSIS — J9602 Acute respiratory failure with hypercapnia: Secondary | ICD-10-CM

## 2015-04-05 DIAGNOSIS — Z86718 Personal history of other venous thrombosis and embolism: Secondary | ICD-10-CM

## 2015-04-05 DIAGNOSIS — Z79899 Other long term (current) drug therapy: Secondary | ICD-10-CM | POA: Diagnosis not present

## 2015-04-05 DIAGNOSIS — J811 Chronic pulmonary edema: Secondary | ICD-10-CM

## 2015-04-05 DIAGNOSIS — E119 Type 2 diabetes mellitus without complications: Secondary | ICD-10-CM

## 2015-04-05 DIAGNOSIS — N179 Acute kidney failure, unspecified: Secondary | ICD-10-CM | POA: Diagnosis present

## 2015-04-05 DIAGNOSIS — R1310 Dysphagia, unspecified: Secondary | ICD-10-CM | POA: Diagnosis present

## 2015-04-05 DIAGNOSIS — Z9109 Other allergy status, other than to drugs and biological substances: Secondary | ICD-10-CM

## 2015-04-05 DIAGNOSIS — E1122 Type 2 diabetes mellitus with diabetic chronic kidney disease: Secondary | ICD-10-CM | POA: Diagnosis present

## 2015-04-05 DIAGNOSIS — Z940 Kidney transplant status: Secondary | ICD-10-CM

## 2015-04-05 DIAGNOSIS — F0390 Unspecified dementia without behavioral disturbance: Secondary | ICD-10-CM | POA: Diagnosis present

## 2015-04-05 DIAGNOSIS — G40909 Epilepsy, unspecified, not intractable, without status epilepticus: Secondary | ICD-10-CM | POA: Diagnosis present

## 2015-04-05 DIAGNOSIS — T8619 Other complication of kidney transplant: Secondary | ICD-10-CM | POA: Diagnosis present

## 2015-04-05 DIAGNOSIS — Z931 Gastrostomy status: Secondary | ICD-10-CM | POA: Diagnosis not present

## 2015-04-05 DIAGNOSIS — M109 Gout, unspecified: Secondary | ICD-10-CM | POA: Diagnosis present

## 2015-04-05 DIAGNOSIS — I248 Other forms of acute ischemic heart disease: Secondary | ICD-10-CM | POA: Diagnosis present

## 2015-04-05 DIAGNOSIS — R4701 Aphasia: Secondary | ICD-10-CM | POA: Diagnosis present

## 2015-04-05 DIAGNOSIS — Z880 Allergy status to penicillin: Secondary | ICD-10-CM

## 2015-04-05 DIAGNOSIS — I2510 Atherosclerotic heart disease of native coronary artery without angina pectoris: Secondary | ICD-10-CM | POA: Diagnosis present

## 2015-04-05 DIAGNOSIS — I132 Hypertensive heart and chronic kidney disease with heart failure and with stage 5 chronic kidney disease, or end stage renal disease: Secondary | ICD-10-CM | POA: Diagnosis present

## 2015-04-05 DIAGNOSIS — J9690 Respiratory failure, unspecified, unspecified whether with hypoxia or hypercapnia: Secondary | ICD-10-CM

## 2015-04-05 DIAGNOSIS — Z794 Long term (current) use of insulin: Secondary | ICD-10-CM | POA: Diagnosis not present

## 2015-04-05 DIAGNOSIS — M7989 Other specified soft tissue disorders: Secondary | ICD-10-CM | POA: Diagnosis not present

## 2015-04-05 DIAGNOSIS — Z91040 Latex allergy status: Secondary | ICD-10-CM

## 2015-04-05 DIAGNOSIS — E213 Hyperparathyroidism, unspecified: Secondary | ICD-10-CM | POA: Diagnosis present

## 2015-04-05 DIAGNOSIS — E1151 Type 2 diabetes mellitus with diabetic peripheral angiopathy without gangrene: Secondary | ICD-10-CM | POA: Diagnosis present

## 2015-04-05 DIAGNOSIS — N186 End stage renal disease: Secondary | ICD-10-CM | POA: Diagnosis not present

## 2015-04-05 DIAGNOSIS — Z7952 Long term (current) use of systemic steroids: Secondary | ICD-10-CM

## 2015-04-05 DIAGNOSIS — B9620 Unspecified Escherichia coli [E. coli] as the cause of diseases classified elsewhere: Secondary | ICD-10-CM | POA: Diagnosis present

## 2015-04-05 DIAGNOSIS — N189 Chronic kidney disease, unspecified: Secondary | ICD-10-CM | POA: Diagnosis not present

## 2015-04-05 DIAGNOSIS — E875 Hyperkalemia: Secondary | ICD-10-CM | POA: Diagnosis present

## 2015-04-05 DIAGNOSIS — Z7982 Long term (current) use of aspirin: Secondary | ICD-10-CM

## 2015-04-05 DIAGNOSIS — Z8701 Personal history of pneumonia (recurrent): Secondary | ICD-10-CM | POA: Diagnosis not present

## 2015-04-05 DIAGNOSIS — Z923 Personal history of irradiation: Secondary | ICD-10-CM | POA: Diagnosis not present

## 2015-04-05 DIAGNOSIS — Z885 Allergy status to narcotic agent status: Secondary | ICD-10-CM

## 2015-04-05 DIAGNOSIS — E860 Dehydration: Secondary | ICD-10-CM | POA: Diagnosis not present

## 2015-04-05 DIAGNOSIS — N485 Ulcer of penis: Secondary | ICD-10-CM | POA: Diagnosis present

## 2015-04-05 DIAGNOSIS — J189 Pneumonia, unspecified organism: Secondary | ICD-10-CM | POA: Diagnosis present

## 2015-04-05 DIAGNOSIS — G9340 Encephalopathy, unspecified: Secondary | ICD-10-CM | POA: Diagnosis present

## 2015-04-05 DIAGNOSIS — N390 Urinary tract infection, site not specified: Secondary | ICD-10-CM | POA: Diagnosis present

## 2015-04-05 DIAGNOSIS — R410 Disorientation, unspecified: Secondary | ICD-10-CM | POA: Diagnosis present

## 2015-04-05 DIAGNOSIS — C8510 Unspecified B-cell lymphoma, unspecified site: Secondary | ICD-10-CM | POA: Diagnosis present

## 2015-04-05 DIAGNOSIS — Y830 Surgical operation with transplant of whole organ as the cause of abnormal reaction of the patient, or of later complication, without mention of misadventure at the time of the procedure: Secondary | ICD-10-CM | POA: Diagnosis present

## 2015-04-05 DIAGNOSIS — A419 Sepsis, unspecified organism: Secondary | ICD-10-CM | POA: Diagnosis present

## 2015-04-05 DIAGNOSIS — Z09 Encounter for follow-up examination after completed treatment for conditions other than malignant neoplasm: Secondary | ICD-10-CM

## 2015-04-05 DIAGNOSIS — R400 Somnolence: Secondary | ICD-10-CM

## 2015-04-05 HISTORY — DX: Kidney transplant status: Z94.0

## 2015-04-05 LAB — URINE MICROSCOPIC-ADD ON

## 2015-04-05 LAB — BLOOD GAS, ARTERIAL
ACID-BASE EXCESS: 3.5 mmol/L — AB (ref 0.0–2.0)
BICARBONATE: 29.9 meq/L — AB (ref 20.0–24.0)
DRAWN BY: 276051
O2 Content: 4 L/min
O2 SAT: 94.1 %
PATIENT TEMPERATURE: 101.1
PO2 ART: 81.6 mmHg (ref 80.0–100.0)
TCO2: 26.9 mmol/L (ref 0–100)
pCO2 arterial: 59.6 mmHg (ref 35.0–45.0)
pH, Arterial: 7.33 — ABNORMAL LOW (ref 7.350–7.450)

## 2015-04-05 LAB — URINALYSIS, ROUTINE W REFLEX MICROSCOPIC
Bilirubin Urine: NEGATIVE
GLUCOSE, UA: NEGATIVE mg/dL
Ketones, ur: NEGATIVE mg/dL
Nitrite: NEGATIVE
Protein, ur: 100 mg/dL — AB
SPECIFIC GRAVITY, URINE: 1.017 (ref 1.005–1.030)
Urobilinogen, UA: 0.2 mg/dL (ref 0.0–1.0)
pH: 5.5 (ref 5.0–8.0)

## 2015-04-05 LAB — BASIC METABOLIC PANEL
ANION GAP: 8 (ref 5–15)
BUN: 71 mg/dL — ABNORMAL HIGH (ref 6–20)
CHLORIDE: 110 mmol/L (ref 101–111)
CO2: 28 mmol/L (ref 22–32)
Calcium: 10 mg/dL (ref 8.9–10.3)
Creatinine, Ser: 2.28 mg/dL — ABNORMAL HIGH (ref 0.61–1.24)
GFR calc Af Amer: 34 mL/min — ABNORMAL LOW (ref >60)
GFR, EST NON AFRICAN AMERICAN: 29 mL/min — AB (ref >60)
Glucose, Bld: 322 mg/dL — ABNORMAL HIGH (ref 65–99)
POTASSIUM: 5.1 mmol/L (ref 3.5–5.1)
SODIUM: 146 mmol/L — AB (ref 135–145)

## 2015-04-05 LAB — COMPREHENSIVE METABOLIC PANEL
ALT: 42 U/L (ref 17–63)
ANION GAP: 3 — AB (ref 5–15)
AST: 55 U/L — ABNORMAL HIGH (ref 15–41)
Albumin: 2.8 g/dL — ABNORMAL LOW (ref 3.5–5.0)
Alkaline Phosphatase: 71 U/L (ref 38–126)
BUN: 73 mg/dL — ABNORMAL HIGH (ref 6–20)
CHLORIDE: 108 mmol/L (ref 101–111)
CO2: 34 mmol/L — AB (ref 22–32)
Calcium: 10.4 mg/dL — ABNORMAL HIGH (ref 8.9–10.3)
Creatinine, Ser: 2.61 mg/dL — ABNORMAL HIGH (ref 0.61–1.24)
GFR calc non Af Amer: 25 mL/min — ABNORMAL LOW (ref >60)
GFR, EST AFRICAN AMERICAN: 29 mL/min — AB (ref >60)
Glucose, Bld: 326 mg/dL — ABNORMAL HIGH (ref 65–99)
POTASSIUM: 5.7 mmol/L — AB (ref 3.5–5.1)
SODIUM: 145 mmol/L (ref 135–145)
Total Bilirubin: 0.9 mg/dL (ref 0.3–1.2)
Total Protein: 8.1 g/dL (ref 6.5–8.1)

## 2015-04-05 LAB — CBC WITH DIFFERENTIAL/PLATELET
Basophils Absolute: 0 10*3/uL (ref 0.0–0.1)
Basophils Relative: 0 %
Eosinophils Absolute: 0 10*3/uL (ref 0.0–0.7)
Eosinophils Relative: 0 %
HEMATOCRIT: 49.9 % (ref 39.0–52.0)
HEMOGLOBIN: 14.6 g/dL (ref 13.0–17.0)
LYMPHS ABS: 1.3 10*3/uL (ref 0.7–4.0)
LYMPHS PCT: 15 %
MCH: 27.9 pg (ref 26.0–34.0)
MCHC: 29.3 g/dL — AB (ref 30.0–36.0)
MCV: 95.2 fL (ref 78.0–100.0)
MONOS PCT: 7 %
Monocytes Absolute: 0.6 10*3/uL (ref 0.1–1.0)
NEUTROS ABS: 7 10*3/uL (ref 1.7–7.7)
NEUTROS PCT: 78 %
Platelets: 174 10*3/uL (ref 150–400)
RBC: 5.24 MIL/uL (ref 4.22–5.81)
RDW: 15.2 % (ref 11.5–15.5)
WBC: 9 10*3/uL (ref 4.0–10.5)

## 2015-04-05 LAB — I-STAT CG4 LACTIC ACID, ED: LACTIC ACID, VENOUS: 2.66 mmol/L — AB (ref 0.5–2.0)

## 2015-04-05 LAB — LACTIC ACID, PLASMA: Lactic Acid, Venous: 2.1 mmol/L (ref 0.5–2.0)

## 2015-04-05 LAB — GLUCOSE, CAPILLARY: GLUCOSE-CAPILLARY: 295 mg/dL — AB (ref 65–99)

## 2015-04-05 LAB — TROPONIN I: TROPONIN I: 0.23 ng/mL — AB (ref <0.031)

## 2015-04-05 LAB — PROCALCITONIN: PROCALCITONIN: 1.9 ng/mL

## 2015-04-05 MED ORDER — INSULIN GLARGINE 100 UNIT/ML ~~LOC~~ SOLN
5.0000 [IU] | Freq: Every day | SUBCUTANEOUS | Status: DC
  Administered 2015-04-05: 5 [IU] via SUBCUTANEOUS
  Filled 2015-04-05 (×2): qty 0.05

## 2015-04-05 MED ORDER — DEXTROSE 5 % IV SOLN
1.0000 g | INTRAVENOUS | Status: DC
  Administered 2015-04-05: 1 g via INTRAVENOUS
  Filled 2015-04-05: qty 1

## 2015-04-05 MED ORDER — ENOXAPARIN SODIUM 40 MG/0.4ML ~~LOC~~ SOLN
40.0000 mg | Freq: Every day | SUBCUTANEOUS | Status: DC
  Administered 2015-04-05 – 2015-04-09 (×5): 40 mg via SUBCUTANEOUS
  Filled 2015-04-05 (×5): qty 0.4

## 2015-04-05 MED ORDER — VANCOMYCIN HCL IN DEXTROSE 1-5 GM/200ML-% IV SOLN
1000.0000 mg | INTRAVENOUS | Status: DC
Start: 2015-04-06 — End: 2015-04-06

## 2015-04-05 MED ORDER — NALOXONE HCL 0.4 MG/ML IJ SOLN: 0.4000 mg | INTRAMUSCULAR | Status: DC | PRN

## 2015-04-05 MED ORDER — SODIUM CHLORIDE 0.9 % IJ SOLN
3.0000 mL | Freq: Two times a day (BID) | INTRAMUSCULAR | Status: DC
  Administered 2015-04-05 – 2015-04-10 (×9): 3 mL via INTRAVENOUS

## 2015-04-05 MED ORDER — ACETAMINOPHEN 650 MG RE SUPP: 650.0000 mg | Freq: Four times a day (QID) | RECTAL | Status: DC | PRN

## 2015-04-05 MED ORDER — LAMOTRIGINE 25 MG PO TABS
50.0000 mg | ORAL_TABLET | Freq: Every day | ORAL | Status: DC
  Administered 2015-04-06 – 2015-04-10 (×5): 50 mg
  Filled 2015-04-05 (×6): qty 2

## 2015-04-05 MED ORDER — VANCOMYCIN HCL IN DEXTROSE 1-5 GM/200ML-% IV SOLN
1000.0000 mg | Freq: Once | INTRAVENOUS | Status: AC
  Administered 2015-04-05: 1000 mg via INTRAVENOUS
  Filled 2015-04-05: qty 200

## 2015-04-05 MED ORDER — SODIUM CHLORIDE 0.9 % IV BOLUS (SEPSIS)
1000.0000 mL | INTRAVENOUS | Status: AC
  Administered 2015-04-05 (×2): 1000 mL via INTRAVENOUS

## 2015-04-05 MED ORDER — ONDANSETRON HCL 4 MG PO TABS
4.0000 mg | ORAL_TABLET | Freq: Four times a day (QID) | ORAL | Status: DC | PRN
Start: 2015-04-05 — End: 2015-04-10

## 2015-04-05 MED ORDER — ASPIRIN EC 81 MG PO TBEC: 81.0000 mg | DELAYED_RELEASE_TABLET | Freq: Every day | ORAL | Status: DC

## 2015-04-05 MED ORDER — TACROLIMUS 0.5 MG PO CAPS
0.5000 mg | ORAL_CAPSULE | Freq: Two times a day (BID) | ORAL | Status: DC
  Administered 2015-04-05 – 2015-04-10 (×10): 0.5 mg via ORAL
  Filled 2015-04-05 (×12): qty 1

## 2015-04-05 MED ORDER — SUCRALFATE 1 GM/10ML PO SUSP
1.0000 g | Freq: Three times a day (TID) | ORAL | Status: DC
  Administered 2015-04-05 – 2015-04-10 (×14): 1 g
  Filled 2015-04-05 (×14): qty 10

## 2015-04-05 MED ORDER — HYDROCODONE-ACETAMINOPHEN 5-325 MG PO TABS: 1.0000 | ORAL_TABLET | ORAL | Status: DC | PRN

## 2015-04-05 MED ORDER — ACETAMINOPHEN 325 MG PO TABS: 650.0000 mg | ORAL_TABLET | Freq: Four times a day (QID) | ORAL | Status: DC | PRN

## 2015-04-05 MED ORDER — PIPERACILLIN-TAZOBACTAM 3.375 G IVPB 30 MIN
3.3750 g | Freq: Once | INTRAVENOUS | Status: AC
  Administered 2015-04-05: 3.375 g via INTRAVENOUS
  Filled 2015-04-05: qty 50

## 2015-04-05 MED ORDER — ONDANSETRON HCL 4 MG/2ML IJ SOLN: 4.0000 mg | Freq: Four times a day (QID) | INTRAMUSCULAR | Status: DC | PRN

## 2015-04-05 MED ORDER — SODIUM CHLORIDE 0.9 % IV BOLUS (SEPSIS)
500.0000 mL | INTRAVENOUS | Status: AC
  Administered 2015-04-05: 500 mL via INTRAVENOUS

## 2015-04-05 MED ORDER — TACROLIMUS 1 MG PO CAPS
3.0000 mg | ORAL_CAPSULE | Freq: Two times a day (BID) | ORAL | Status: DC
  Administered 2015-04-05 – 2015-04-10 (×10): 3 mg via ORAL
  Filled 2015-04-05 (×12): qty 3

## 2015-04-05 MED ORDER — INSULIN ASPART 100 UNIT/ML ~~LOC~~ SOLN
0.0000 [IU] | SUBCUTANEOUS | Status: DC
  Administered 2015-04-05: 5 [IU] via SUBCUTANEOUS
  Administered 2015-04-06: 3 [IU] via SUBCUTANEOUS
  Administered 2015-04-06: 5 [IU] via SUBCUTANEOUS
  Administered 2015-04-06: 9 [IU] via SUBCUTANEOUS
  Administered 2015-04-06: 7 [IU] via SUBCUTANEOUS

## 2015-04-05 MED ORDER — PREDNISONE 5 MG PO TABS
5.0000 mg | ORAL_TABLET | Freq: Every day | ORAL | Status: DC
  Administered 2015-04-07 – 2015-04-10 (×4): 5 mg
  Filled 2015-04-05 (×2): qty 1
  Filled 2015-04-05 (×3): qty 0.5

## 2015-04-05 MED ORDER — HYDROCORTISONE NA SUCCINATE PF 100 MG IJ SOLR
100.0000 mg | Freq: Four times a day (QID) | INTRAMUSCULAR | Status: DC
  Administered 2015-04-05 – 2015-04-07 (×6): 100 mg via INTRAVENOUS
  Filled 2015-04-05 (×6): qty 2

## 2015-04-05 MED ORDER — SODIUM CHLORIDE 0.9 % IV BOLUS (SEPSIS)
250.0000 mL | Freq: Once | INTRAVENOUS | Status: AC
  Administered 2015-04-05: 250 mL via INTRAVENOUS

## 2015-04-05 NOTE — ED Notes (Signed)
[place], EDP made aware of patient CG4 lactic result. 

## 2015-04-05 NOTE — ED Notes (Signed)
EMS called to [facility] where pt resides because pt who was recently diagnosed with PNA was having difficulty breathing and was "different from his baseline". Pt is nonverbal at baseline and EMS couldn't give exacts at to how pt was differed from baseline. EMS found fentanyl patch, which they removed, and they administered narcan however to change in pt noted per EMS. Pt with temp of 102 at SNF and tylenol was given at facility around 1500.

## 2015-04-05 NOTE — ED Provider Notes (Signed)
CSN: [id number]     Arrival date & time 04/05/15  1543 History   First MD Initiated Contact with Patient 04/05/15 1555     Chief Complaint  Patient presents with  . Altered Mental Status     (Consider location/radiation/quality/duration/timing/severity/associated sxs/prior Treatment) HPI Comments: Patient acting different than baseline. He is nonverbal from prior intracranial hemorrhage. He is a full code. Recently here for sepsis, pneumonia and UTI. Here today with a fever and is not acting like himself. No change with Narcan with EMS   Patient is a 59 y.o. male presenting with altered mental status. The history is provided by the patient.  Altered Mental Status Presenting symptoms: lethargy   Severity:  Moderate Most recent episode:  Today Episode history:  Single Timing:  Constant Progression:  Unchanged Chronicity:  Recurrent Context: dementia, nursing home resident, recent illness and recent infection    Level V caveat - altered mental status  Past Medical History  Diagnosis Date  . Hypertension   . Dyslipidemia   . Carpal tunnel syndrome   . A-V fistula ([place])   . Anemia   . Hemorrhoids   . Hyperparathyroidism   . Intracranial hemorrhage ([place])   . Hypotension   . Hyperkalemia   . Kidney disease   . Sepsis(995.91)   . GI bleeding   . DVT (deep venous thrombosis) ([place])   . PVD (peripheral vascular disease) ([place])   . DM (diabetes mellitus) ([place])   . Cellulitis   . IV infiltration     Right upper extremity  . PPD positive   . 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 Hypokinesis     EF of 60% with mild anterior hypokinesis, minimal coronary artery disease on catheterization in December of 2007  . Gout    Past Surgical History  Procedure Laterality Date  . Kidney transplant      History of renal transplant maintained on chronic immunosuppressive therapy  . Av fistula placement      Right arm  . Peripherally  inserted central catheter insertion      line placement  . Amputation    . Gastrostomy    . 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 Brain biopsy    . #6 shiley cuff      Less trache   Family History  Problem Relation Age of Onset  . Heart disease Mother    Social History  Substance Use Topics  . Smoking status: Former Smoker    Quit date: 01/06/2001  . Smokeless tobacco: None  . Alcohol Use: No    Review of Systems  Unable to perform ROS: Dementia      Allergies  Amoxicillin; Ampicillin; Latex; Morphine and related; Penicillins; and Tape  Home Medications   Prior to Admission medications   Medication Sig Start Date End Date Taking? Authorizing Provider  aspirin 81 MG tablet Take 81 mg by mouth daily.     Yes Historical Provider, MD  atorvastatin (LIPITOR) 10 MG tablet Take 10 mg by mouth daily.   Yes Historical Provider, MD  citalopram (CELEXA) 10 MG tablet Take 20 mg by mouth daily.    Yes Historical Provider, MD  Cranberry 425 MG CAPS Take 425 mg by mouth daily.   Yes Historical Provider, MD  Dextromethorphan-Quinidine 20-10 MG CAPS Take 1 capsule by mouth every 12 (twelve) hours.   Yes Historical Provider, MD  fentaNYL ([place] -  DOSED MCG/HR) 25 MCG/HR patch Place 25 mcg onto the skin every 3 (three) days.   Yes Historical Provider, MD  gabapentin (NEURONTIN) 300 MG capsule Take 300 mg by mouth 3 (three) times daily.  05/20/13  Yes Historical Provider, MD  hydrALAZINE (APRESOLINE) 25 MG tablet Take 25 mg by mouth 2 (two) times daily.   Yes Historical Provider, MD  insulin glargine (LANTUS) 100 UNIT/ML injection Inject 0.1 mLs (10 Units total) into the skin at bedtime. 10/21/14  Yes Florencia Reasons, MD  insulin lispro (HUMALOG KWIKPEN) 100 UNIT/ML KiwkPen Inject 9 Units into the skin 3 (three) times daily before meals.   Yes Historical Provider, MD  isosorbide dinitrate (ISORDIL) 10 MG tablet Take 10 mg by mouth 2 (two) times daily.   Yes Historical Provider, MD    lamoTRIgine (LAMICTAL) 25 MG tablet Take 50 mg by mouth daily.   Yes Historical Provider, MD  loperamide (IMODIUM A-D) 2 MG tablet Take 2 mg by mouth daily as needed for diarrhea or loose stools. Max 16 mg per day   Yes Historical Provider, MD  LORazepam (ATIVAN) 0.5 MG tablet Take 0.5 mg by mouth every 8 (eight) hours as needed. Anxiety   Yes Historical Provider, MD  LORazepam (ATIVAN) 2 MG/ML injection Inject 1 mg into the vein every 6 (six) hours as needed for anxiety.   Yes Historical Provider, MD  Nutritional Supplements (NUTRITIONAL DRINK PO) Take 30 mLs by mouth daily.   Yes Historical Provider, MD  omeprazole (PRILOSEC) 20 MG capsule Take 20 mg by mouth daily.     Yes Historical Provider, MD  ondansetron (ZOFRAN) 4 MG tablet Take 4 mg by mouth every 8 (eight) hours as needed for nausea or vomiting.   Yes Historical Provider, MD  oxyCODONE-acetaminophen (PERCOCET) 7.5-325 MG per tablet Take 1 tablet by mouth every 4 (four) hours as needed for severe pain. 03/06/15  Yes Charlynne Cousins, MD  potassium & sodium phosphates (PHOS-NAK) 280-160-250 MG PACK Take 1 packet by mouth 4 (four) times daily -  with meals and at bedtime.   Yes Historical Provider, MD  predniSONE (DELTASONE) 5 MG tablet Take 5 mg by mouth daily. 03/30/15  Yes Historical Provider, MD  promethazine (PHENERGAN) 25 MG/ML injection Inject 25 mg into the vein every 6 (six) hours as needed for nausea or vomiting.   Yes Historical Provider, MD  senna-docusate (SENOKOT-S) 8.6-50 MG per tablet Take 1 tablet by mouth daily.   Yes Historical Provider, MD  sucralfate (CARAFATE) 1 GM/10ML suspension Take 1 g by mouth every 8 (eight) hours.   Yes Historical Provider, MD  tacrolimus (PROGRAF) 0.5 MG capsule Take 0.5 mg by mouth 2 (two) times daily.   Yes Historical Provider, MD  tacrolimus (PROGRAF) 1 MG capsule Take 3 mg by mouth 2 (two) times daily.   Yes Historical Provider, MD  tiZANidine (ZANAFLEX) 2 MG tablet Take 2 mg by mouth 2  (two) times daily.  05/26/13  Yes Historical Provider, MD  Vitamin D, Ergocalciferol, (DRISDOL) 50000 UNITS CAPS Take 50,000 Units by mouth every 30 (thirty) days.   Yes Historical Provider, MD  Water For Irrigation, Sterile (FREE WATER) SOLN Place 200 mLs into feeding tube every 8 (eight) hours. 03/06/15  Yes Charlynne Cousins, MD  allopurinol (ZYLOPRIM) 100 MG tablet Take 100 mg by mouth daily.      Historical Provider, MD  levETIRAcetam (KEPPRA) 100 MG/ML solution Place 5 mLs (500 mg total) into feeding tube 2 (two) times daily. Patient  not taking: Reported on 04/05/2015 03/06/15   Charlynne Cousins, MD  Nutritional Supplements (FEEDING SUPPLEMENT, GLUCERNA 1.2 CAL,) LIQD Place 1,000 mLs into feeding tube continuous. Patient not taking: Reported on 04/05/2015 03/06/15   Charlynne Cousins, MD   BP 117/62 mmHg  Pulse 98  Temp(Src) 102.5 F (39.2 C) (Rectal)  Resp 18  SpO2 100% Physical Exam  Constitutional: He is oriented to person, place, and time. He appears well-developed and well-nourished. No distress.  HENT:  Head: Normocephalic and atraumatic.  Mouth/Throat: No oropharyngeal exudate.  Eyes: EOM are normal. Pupils are equal, round, and reactive to light.  Neck: Normal range of motion. Neck supple.  Cardiovascular: Normal rate and regular rhythm.  Exam reveals no friction rub.   No murmur heard. Pulmonary/Chest: Effort normal and breath sounds normal. No respiratory distress. He has no wheezes. He has no rales.  Abdominal: Soft. He exhibits no distension. There is no tenderness. There is no rebound.  Genitourinary:     Large ulcer/mass on penis, skin changes within border drawn. Border has fungating mass-like lesions. No focal drainage, but wound is weeping with a foul odor.  Musculoskeletal: Normal range of motion. He exhibits no edema.  Neurological: He is alert and oriented to person, place, and time.  Responds to pain, nonverbal  Skin: He is not diaphoretic.  Nursing note  and vitals reviewed.   ED Course  Procedures (including critical care time) Labs Review Labs Reviewed  CULTURE, BLOOD (ROUTINE X 2)  CULTURE, BLOOD (ROUTINE X 2)  URINE CULTURE  COMPREHENSIVE METABOLIC PANEL  CBC WITH DIFFERENTIAL/PLATELET  URINALYSIS, ROUTINE W REFLEX MICROSCOPIC (NOT AT [facility])  PROCALCITONIN  BLOOD GAS, ARTERIAL  I-STAT CG4 LACTIC ACID, ED    Imaging Review Ct Head Wo Contrast  04/05/2015   CLINICAL DATA:  Altered mental status. Patient reported to be altered from baseline. Patient nonverbal. No response of symptoms to or chronic reversal.  EXAM: CT HEAD WITHOUT CONTRAST  TECHNIQUE: Contiguous axial images were obtained from the base of the skull through the vertex without intravenous contrast.  COMPARISON:  03/01/2015.  FINDINGS: Generalized atrophy. Chronic microvascular ischemic change. 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 Compensatory enlargement LEFT lateral ventricle.  No acute stroke or acute hemorrhage. No extra-axial fluid. No mass lesion.  There is evidence for a LEFT frontal burr hole, otherwise the calvarium is intact. There is no acute sinus or mastoid disease. No change from priors.  IMPRESSION: Remote LEFT hemisphere ischemic event is stable. No acute intracranial findings. No posttraumatic sequelae are evident.   Electronically Signed   By: Staci Righter M.D.   On: 04/05/2015 17:53   Dg Chest Port 1 View  04/05/2015   CLINICAL DATA:  Altered mental status, hypotension, diabetes  EXAM: PORTABLE CHEST 1 VIEW  COMPARISON:  03/01/2015  FINDINGS: Cardiac size exaggerated by kyphotic positioning and low inspiratory effect. Allowing for this is likely upper normal. Vascular pattern appears without pulmonary venous hypertension. There are increased interstitial markings as well as perihilar infiltrates bilaterally. These are new from the prior study.   IMPRESSION: Appearance suggests pulmonary edema is a possibility. Bilateral lower lobe pneumonia could also be considered.   Electronically Signed   By: Skipper Cliche M.D.   On: 04/05/2015 16:48   I have personally reviewed and evaluated these images and lab results as part of my medical decision-making.   EKG Interpretation  Date/Time:  Sunday April 05 2015 15:58:51 EDT Ventricular Rate:  96 PR Interval:  140 QRS Duration: 110 QT Interval:  342 QTC Calculation: 432 R Axis:   -49 Text Interpretation:  Sinus rhythm LAD, consider left anterior fascicular  block Abnormal R-wave progression, late transition Repol abnrm suggests  ischemia, lateral leads ST elevation, consider inferior injury Baseline  wander in lead(s) III aVF V4 No significant change since last tracing  Confirmed by Mingo Amber  MD, Despina Boan ([id number]) on 04/06/2015 12:14:21 AM      MDM   Final diagnoses:  Somnolence  UTI (lower urinary tract infection)  Healthcare-associated pneumonia    59 year old male with history of intracranial hemorrhage, recent infection presents with fever and altered mental status. He is to acting different from his baseline. Here he is nonverbal, responds to pain. He is otherwise mildly lethargic and sleeping. He recent had pneumonia and UTI. He cannot communicate whatsoever. He's got a large fungating mass around the base of his penis. Does have without odor but no focal drainage. It is weeping. With fever and altered mental status, concern for encephalopathy due to infection. Will obtain labs and cultures. Sepsis protocol initiated. Broad-spectrum anabiotics given. Hospitalist admitting. Urology consulted for his suprapubic skin lesion. They recommended CT of the pelvis. It was noted that one month ago urology saw patient and recommended dermatology or plastic surgery consult.   Evelina Bucy, MD 04/06/15 [phone]

## 2015-04-05 NOTE — H&P (Addendum)
PCP:  Garwin Brothers, MD  Cardiology [facility]  Referring provider [facility]   Chief Complaint:  Increased confusion and hypoxia, he was diagnosed with PNA at the SNF and sent to ER.   in HPI: Justin [name] is a 59 y.o. male   has a past medical history of Hypertension; Dyslipidemia; Carpal tunnel syndrome; A-V fistula ([place]); Anemia; Hemorrhoids; Hyperparathyroidism; Intracranial hemorrhage ([place]); Hypotension; Hyperkalemia; Kidney disease; Sepsis(995.91); GI bleeding; DVT (deep venous thrombosis) (HCC); PVD (peripheral vascular disease) ([place]); DM (diabetes mellitus) ([place]); Cellulitis; IV infiltration; PPD positive; Intracerebral bleed ([place]); Hypokinesis; and Gout.   Presented with worsening lethargy unresponsiveness. In the nursing home patient was found to be febrile in rout EMS took off fentanyl patch an gave a dose of narcan with no improvement. On arrival to ER fever up to 102. Code sepsis was called. Patient was found to have evidence of urinary tract infection as well as questionable pneumonia. We will start broad-spectrum antibiotics including vancomycin and Fortaz. Blood cultures and urine cultures were obtained. ABG was done showing pH of 7.33 PCO2 59.9 PO2 of 80. Given the patient is significantly lethargic showing signs of sepsis patient was admitted to stepdown [facility] M has been consulted. Recommended this point initiation BiPAP .  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 Patient was admitted to [facility] doing a biopsy of the lesion he sustained intracranial hemorrhage resulting in right-sided weakness and dysphasia and controlling his airway at that point he requested required tracheostomy and PEG tube placement. This was in 2009 7 that patient has been  at nursing home facilities. Patient's lymphoma was treated with whole brain  radiation but he deemed to be not a candidate for chemotherapy.   Patient has history of peripheral vascular disease requiring right fifth toe amputation in the past  Examination has history of DVT status post inferior vena cava filter placement and 2009 Patietn has been hospitalized from August 24 to September 2 with sepsis secondary to Escherichia coli from urine. June admission he was noted to be severely confused which seem to have improved after IV rehydration. During that admission urology has seen a for the chronic ulcer and felt that he would be better served by follow-up of dermatology or plastics. Unfortunately patient was unable to do that secondary to mobility issues. During hospitalization it was noted patient had hard time take as medications his family agreed to have a PEG tube placement which was done by radiology on September 1.  He was discharged back to his nursing home  Of note patient is sp renal transplant family states 9 years ago he is currently on prednisone and tacrolymus but family states has not been following up with nephrology. Today   he has acute on chronic renal failure.  Hospitalist was called for admission for HCAP uti and sepsis and hypercarbia  Review of Systems:    Pertinent positives include:  Confusion,  Fevers, chills, frequent falls  Constitutional:  No weight loss, night sweats,  fatigue, weight loss  HEENT:  No headaches, Difficulty swallowing,Tooth/dental problems,Sore throat,  No sneezing, itching, ear ache, nasal congestion, post nasal drip,  Cardio-vascular:  No chest pain, Orthopnea, PND, anasarca, dizziness, palpitations.no Bilateral lower extremity swelling  GI:  No heartburn, indigestion, abdominal pain, nausea, vomiting, diarrhea, change in bowel habits, loss of appetite, melena, blood in stool, hematemesis Resp:  no shortness of breath  at rest. No dyspnea on exertion, No excess mucus, no productive cough, No non-productive cough, No  coughing up of blood.No change in color of mucus.No wheezing. Skin:  no rash or lesions. No jaundice GU:  no dysuria, change in color of urine, no urgency or frequency. No straining to urinate.  No flank pain.  Musculoskeletal:  No joint pain or no joint swelling. No decreased range of motion. No back pain.  Psych:  No change in mood or affect. No depression or anxiety. No memory loss.  Neuro: no localizing neurological complaints, no tingling, no weakness, no double vision, no gait abnormality, no slurred speech, no confusion  Otherwise ROS are negative except for above, 10 systems were reviewed  Past Medical History: Past Medical History  Diagnosis Date  . Hypertension   . Dyslipidemia   . Carpal tunnel syndrome   . A-V fistula ([place])   . Anemia   . Hemorrhoids   . Hyperparathyroidism   . Intracranial hemorrhage ([place])   . Hypotension   . Hyperkalemia   . Kidney disease   . Sepsis(995.91)   . GI bleeding   . DVT (deep venous thrombosis) ([place])   . PVD (peripheral vascular disease) ([place])   . DM (diabetes mellitus) ([place])   . Cellulitis   . IV infiltration     Right upper extremity  . PPD positive   . 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 Hypokinesis     EF of 60% with mild anterior hypokinesis, minimal coronary artery disease on catheterization in December of 2007  . Gout    Past Surgical History  Procedure Laterality Date  . Kidney transplant      History of renal transplant maintained on chronic immunosuppressive therapy  . Av fistula placement      Right arm  . Peripherally inserted central catheter insertion      line placement  . Amputation    . Gastrostomy    . 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 Brain biopsy    . #6 shiley cuff      Less trache     Medications: Prior to Admission medications   Medication Sig Start Date End Date Taking? Authorizing Provider  aspirin 81 MG tablet Take 81  mg by mouth daily.     Yes Historical Provider, MD  atorvastatin (LIPITOR) 10 MG tablet Take 10 mg by mouth daily.   Yes Historical Provider, MD  citalopram (CELEXA) 10 MG tablet Take 20 mg by mouth daily.    Yes Historical Provider, MD  Cranberry 425 MG CAPS Take 425 mg by mouth daily.   Yes Historical Provider, MD  Dextromethorphan-Quinidine 20-10 MG CAPS Take 1 capsule by mouth every 12 (twelve) hours.   Yes Historical Provider, MD  fentaNYL (DURAGESIC - DOSED MCG/HR) 25 MCG/HR patch Place 25 mcg onto the skin every 3 (three) days.   Yes Historical Provider, MD  gabapentin (NEURONTIN) 300 MG capsule Take 300 mg by mouth 3 (three) times daily.  05/20/13  Yes Historical Provider, MD  hydrALAZINE (APRESOLINE) 25 MG tablet Take 25 mg by mouth 2 (two) times daily.   Yes Historical Provider, MD  insulin glargine (LANTUS) 100 UNIT/ML injection Inject 0.1 mLs (10 Units total) into the skin at bedtime. 10/21/14  Yes Florencia Reasons, MD  insulin lispro (HUMALOG KWIKPEN) 100 UNIT/ML KiwkPen Inject 9 Units into the skin 3 (three) times daily before  meals.   Yes Historical Provider, MD  isosorbide dinitrate (ISORDIL) 10 MG tablet Take 10 mg by mouth 2 (two) times daily.   Yes Historical Provider, MD  lamoTRIgine (LAMICTAL) 25 MG tablet Take 50 mg by mouth daily.   Yes Historical Provider, MD  loperamide (IMODIUM A-D) 2 MG tablet Take 2 mg by mouth daily as needed for diarrhea or loose stools. Max 16 mg per day   Yes Historical Provider, MD  LORazepam (ATIVAN) 0.5 MG tablet Take 0.5 mg by mouth every 8 (eight) hours as needed. Anxiety   Yes Historical Provider, MD  LORazepam (ATIVAN) 2 MG/ML injection Inject 1 mg into the vein every 6 (six) hours as needed for anxiety.   Yes Historical Provider, MD  Nutritional Supplements (NUTRITIONAL DRINK PO) Take 30 mLs by mouth daily.   Yes Historical Provider, MD  omeprazole (PRILOSEC) 20 MG capsule Take 20 mg by mouth daily.     Yes Historical Provider, MD  ondansetron (ZOFRAN)  4 MG tablet Take 4 mg by mouth every 8 (eight) hours as needed for nausea or vomiting.   Yes Historical Provider, MD  oxyCODONE-acetaminophen (PERCOCET) 7.5-325 MG per tablet Take 1 tablet by mouth every 4 (four) hours as needed for severe pain. 03/06/15  Yes Charlynne Cousins, MD  potassium & sodium phosphates (PHOS-NAK) 280-160-250 MG PACK Take 1 packet by mouth 4 (four) times daily -  with meals and at bedtime.   Yes Historical Provider, MD  predniSONE (DELTASONE) 5 MG tablet Take 5 mg by mouth daily. 03/30/15  Yes Historical Provider, MD  promethazine (PHENERGAN) 25 MG/ML injection Inject 25 mg into the vein every 6 (six) hours as needed for nausea or vomiting.   Yes Historical Provider, MD  senna-docusate (SENOKOT-S) 8.6-50 MG per tablet Take 1 tablet by mouth daily.   Yes Historical Provider, MD  sucralfate (CARAFATE) 1 GM/10ML suspension Take 1 g by mouth every 8 (eight) hours.   Yes Historical Provider, MD  tacrolimus (PROGRAF) 0.5 MG capsule Take 0.5 mg by mouth 2 (two) times daily.   Yes Historical Provider, MD  tacrolimus (PROGRAF) 1 MG capsule Take 3 mg by mouth 2 (two) times daily.   Yes Historical Provider, MD  tiZANidine (ZANAFLEX) 2 MG tablet Take 2 mg by mouth 2 (two) times daily.  05/26/13  Yes Historical Provider, MD  Vitamin D, Ergocalciferol, (DRISDOL) 50000 UNITS CAPS Take 50,000 Units by mouth every 30 (thirty) days.   Yes Historical Provider, MD  Water For Irrigation, Sterile (FREE WATER) SOLN Place 200 mLs into feeding tube every 8 (eight) hours. 03/06/15  Yes Charlynne Cousins, MD  allopurinol (ZYLOPRIM) 100 MG tablet Take 100 mg by mouth daily.      Historical Provider, MD  levETIRAcetam (KEPPRA) 100 MG/ML solution Place 5 mLs (500 mg total) into feeding tube 2 (two) times daily. Patient not taking: Reported on 04/05/2015 03/06/15   Charlynne Cousins, MD  Nutritional Supplements (FEEDING SUPPLEMENT, GLUCERNA 1.2 CAL,) LIQD Place 1,000 mLs into feeding tube continuous. Patient  not taking: Reported on 04/05/2015 03/06/15   Charlynne Cousins, MD    Allergies:   Allergies  Allergen Reactions  . Amoxicillin Other (See Comments)    ON MAR  . Ampicillin Other (See Comments)    ON MAR  . Latex Other (See Comments)    ON MAR  . Morphine And Related Other (See Comments)    UNK  . Penicillins Other (See Comments)    ON MAR; tolerated  Ceftriaxone 8/27-9/1  . Tape Other (See Comments)    ON MAR    Social History:  Ambulatory wheelchair bound,     From facility  [facility]   reports that he quit smoking about 14 years ago. He does not have any smokeless tobacco history on file. He reports that he uses illicit drugs. He reports that he does not drink alcohol.    Family History: family history includes CAD in his mother; Diabetes type II in his mother; Heart disease in his mother; Hypertension in his mother. There is no history of Cancer.    Physical Exam: Patient Vitals for the past 24 hrs:  BP Temp Temp src Pulse Resp SpO2 Height Weight  04/05/15 1829 (!) 105/49 mmHg 100 F (37.8 C) Core 84 18 99 % - -  04/05/15 1712 - - - - - - 6' (1.829 m) 81.874 kg (180 lb 8 oz)  04/05/15 1705 (!) 104/40 mmHg - - - - - - -  04/05/15 1601 117/62 mmHg 102.5 F (39.2 C) Rectal 98 18 100 % - -  04/05/15 1600 - - - - - 96 % - -    1. General:  in No Acute distress 2. Psychological: somnolent not Oriented, non verbal 3. Head/ENT:    Dry Mucous Membranes                          Head Non traumatic, neck supple                           Poor Dentition 4. SKIN:  decreased Skin turgor,  Skin clean notedarea of ulceration around the penile and scrotum shaft with rolled up borderes 5. Heart: Regular rate and rhythm no Murmur, Rub or gallop 6. Lungs: Clear to auscultation bilaterally, no wheezes or crackles   7. Abdomen: Soft, non-tender, Non distended, PEG in place 8. Lower extremities: no clubbing, cyanosis, or edema 9. Neurologically Grossly intact, moving  all 4 extremities equally 10. MSK: Normal range of motion  body mass index is 24.47 kg/(m^2).   Labs on Admission:   Results for orders placed or performed during the hospital encounter of 04/05/15 (from the past 24 hour(s))  Comprehensive metabolic panel     Status: Abnormal   Collection Time: 04/05/15  4:50 PM  Result Value Ref Range   Sodium 145 135 - 145 mmol/L   Potassium 5.7 (H) 3.5 - 5.1 mmol/L   Chloride 108 101 - 111 mmol/L   CO2 34 (H) 22 - 32 mmol/L   Glucose, Bld 326 (H) 65 - 99 mg/dL   BUN 73 (H) 6 - 20 mg/dL   Creatinine, Ser 2.61 (H) 0.61 - 1.24 mg/dL   Calcium 10.4 (H) 8.9 - 10.3 mg/dL   Total Protein 8.1 6.5 - 8.1 g/dL   Albumin 2.8 (L) 3.5 - 5.0 g/dL   AST 55 (H) 15 - 41 U/L   ALT 42 17 - 63 U/L   Alkaline Phosphatase 71 38 - 126 U/L   Total Bilirubin 0.9 0.3 - 1.2 mg/dL   GFR calc non Af Amer 25 (L) >60 mL/min   GFR calc Af Amer 29 (L) >60 mL/min   Anion gap 3 (L) 5 - 15  CBC WITH DIFFERENTIAL     Status: Abnormal   Collection Time: 04/05/15  4:50 PM  Result Value Ref Range   WBC 9.0 4.0 - 10.5  K/uL   RBC 5.24 4.22 - 5.81 MIL/uL   Hemoglobin 14.6 13.0 - 17.0 g/dL   HCT 49.9 39.0 - 52.0 %   MCV 95.2 78.0 - 100.0 fL   MCH 27.9 26.0 - 34.0 pg   MCHC 29.3 (L) 30.0 - 36.0 g/dL   RDW 15.2 11.5 - 15.5 %   Platelets 174 150 - 400 K/uL   Neutrophils Relative % 78 %   Neutro Abs 7.0 1.7 - 7.7 K/uL   Lymphocytes Relative 15 %   Lymphs Abs 1.3 0.7 - 4.0 K/uL   Monocytes Relative 7 %   Monocytes Absolute 0.6 0.1 - 1.0 K/uL   Eosinophils Relative 0 %   Eosinophils Absolute 0.0 0.0 - 0.7 K/uL   Basophils Relative 0 %   Basophils Absolute 0.0 0.0 - 0.1 K/uL  Procalcitonin     Status: None   Collection Time: 04/05/15  4:50 PM  Result Value Ref Range   Procalcitonin 1.90 ng/mL  Urinalysis, Routine w reflex microscopic (not at [facility])     Status: Abnormal   Collection Time: 04/05/15  5:00 PM  Result Value Ref Range   Color, Urine YELLOW YELLOW   APPearance  TURBID (A) CLEAR   Specific Gravity, Urine 1.017 1.005 - 1.030   pH 5.5 5.0 - 8.0   Glucose, UA NEGATIVE NEGATIVE mg/dL   Hgb urine dipstick SMALL (A) NEGATIVE   Bilirubin Urine NEGATIVE NEGATIVE   Ketones, ur NEGATIVE NEGATIVE mg/dL   Protein, ur 100 (A) NEGATIVE mg/dL   Urobilinogen, UA 0.2 0.0 - 1.0 mg/dL   Nitrite NEGATIVE NEGATIVE   Leukocytes, UA LARGE (A) NEGATIVE  Urine microscopic-add on     Status: Abnormal   Collection Time: 04/05/15  5:00 PM  Result Value Ref Range   WBC, UA TOO NUMEROUS TO COUNT <3 WBC/hpf   RBC / HPF 3-6 <3 RBC/hpf   Bacteria, UA MANY (A) RARE  I-Stat CG4 Lactic Acid, ED  (not at  [facility])     Status: Abnormal   Collection Time: 04/05/15  5:08 PM  Result Value Ref Range   Lactic Acid, Venous 2.66 (HH) 0.5 - 2.0 mmol/L   Comment NOTIFIED PHYSICIAN   Blood gas, arterial     Status: Abnormal   Collection Time: 04/05/15  5:28 PM  Result Value Ref Range   O2 Content 4.0 L/min   Delivery systems NASAL CANNULA    pH, Arterial 7.330 (L) 7.350 - 7.450   pCO2 arterial 59.6 (HH) 35.0 - 45.0 mmHg   pO2, Arterial 81.6 80.0 - 100.0 mmHg   Bicarbonate 29.9 (H) 20.0 - 24.0 mEq/L   TCO2 26.9 0 - 100 mmol/L   Acid-Base Excess 3.5 (H) 0.0 - 2.0 mmol/L   O2 Saturation 94.1 %   Patient temperature 101.1    Collection site LEFT RADIAL    Drawn by [id number]    Sample type ARTERIAL DRAW    Allens test (pass/fail) PASS PASS    UA WBC TNTC, many bacteria  Lab Results  Component Value Date   HGBA1C 7.9* 10/20/2014    Estimated Creatinine Clearance: 32.6 mL/min (by C-G formula based on Cr of 2.61).  BNP (last 3 results) No results for input(s): PROBNP in the last 8760 hours.  Other results:  I have pearsonaly reviewed this: ECG REPORT  Rate: 96  Rhythm: Anterior fascicular block  ST&T Change: No ischemic changes QTC within normal limits    Filed Weights   04/05/15 1712  Weight: 81.874 kg (  180 lb 8 oz)     Cultures:    Component Value Date/Time    SDES URINE, RANDOM 02/27/2015 1924   SPECREQUEST NONE 02/27/2015 1924   CULT >=100,000 COLONIES/mL ESCHERICHIA COLI 02/26/2015 0105   REPTSTATUS 03/02/2015 FINAL 02/27/2015 1924     Radiological Exams on Admission: Ct Head Wo Contrast  04/05/2015   CLINICAL DATA:  Altered mental status. Patient reported to be altered from baseline. Patient nonverbal. No response of symptoms to or chronic reversal.  EXAM: CT HEAD WITHOUT CONTRAST  TECHNIQUE: Contiguous axial images were obtained from the base of the skull through the vertex without intravenous contrast.  COMPARISON:  03/01/2015.  FINDINGS: Generalized atrophy. Chronic microvascular ischemic change. 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 Compensatory enlargement LEFT lateral ventricle.  No acute stroke or acute hemorrhage. No extra-axial fluid. No mass lesion.  There is evidence for a LEFT frontal burr hole, otherwise the calvarium is intact. There is no acute sinus or mastoid disease. No change from priors.  IMPRESSION: Remote LEFT hemisphere ischemic event is stable. No acute intracranial findings. No posttraumatic sequelae are evident.   Electronically Signed   By: Staci Righter M.D.   On: 04/05/2015 17:53   Dg Chest Port 1 View  04/05/2015   CLINICAL DATA:  Altered mental status, hypotension, diabetes  EXAM: PORTABLE CHEST 1 VIEW  COMPARISON:  03/01/2015  FINDINGS: Cardiac size exaggerated by kyphotic positioning and low inspiratory effect. Allowing for this is likely upper normal. Vascular pattern appears without pulmonary venous hypertension. There are increased interstitial markings as well as perihilar infiltrates bilaterally. These are new from the prior study.  IMPRESSION: Appearance suggests pulmonary edema is a possibility. Bilateral lower lobe pneumonia could also be considered.   Electronically Signed   By: Skipper Cliche M.D.    On: 04/05/2015 16:48    Chart has been reviewed  Family  at  Bedside  plan of care was discussed with  Daughter  April Bunney [phone]  Assessment/Plan  59 year old gentleman with history of cadaveric renal transplant for end-stage renal disease in 2008 on chronic immunosuppression has developed B cell intracranial lymphoma complicated by intracranial bleeding after biopsy attempt resulting in right-sided weakness and dysphagia now status post PEG tube with recurrent admissions for sepsis here with evidence of UTI and possible pneumonia with evidence of sepsis.  Present on Admission:  . Sepsis ([place]) severe- admit to step down on broad-spectrum  antibiotics , IV fluid resuscitation. Full lactic acid. Pulmonology critical care is aware  . Pneumonia - cover for healthcare associated pneumonia patient has history of dysphagia status post PEG keep nothing by mouth  . Acute renal failure superimposed on stage 3 chronic kidney disease ([place]) - patient has history of end-stage renal disease status post cadaveric transplant he is baseline creatinine is 1.7. Currently creatinin up to 2.6 patient will need to follow-up with nephrology. We'll consult them to help manage patient with history of renal transplant  . Essential hypertension - fairly hypotensive we'll hold blood pressure meds  . UTI (lower urinary tract infection) coverage of IV antibiotics elevated results of urine culture   . Acute respiratory failure with hypoxia and hypercarbia (HCC) - start on BiPAP. Patient currently poorly responsive, [facility] M is aware and will help manage. may need intubation if mental status is not improved. Attempt  Narcan again repeat ABG the next hour  Dm 2  -  decreased dose of Lantus to 5 order sliding scale Penile also is chronic but could not rule out malignancy. The patient will need to biopsy it is difficult to transport for outpatient setting may benefit from inpatient biopsy once more stable Hyperkalemia,  internist telemetry and hold potassium recheck bmet after IV fluid ressussitation no EKG changes History of systolic heart failure. Patient appears to be currently fluid down will rehydrate to support blood pressure. Repeat echo gram Hypertension given the patient is on chronic immunosuppressive including prednisone will give stress dose steroids with Solu-Cortef 100 every 6 Prophylaxis:   Lovenox   CODE STATUS:  FULL CODE  as per  family  Disposition:                            Back to current facility when stable                           Other plan as per orders.  I have spent a total of 75 min on this admission extra time was spent to discuss patient with PCCM and Dr. Posey Pronto with nephrology  [facility] 04/05/2015, 6:54 PM  [facility]  Pager [phone]   after 2 AM please page floor coverage PA If 7AM-7PM, please contact the day team taking care of the patient  [email or web address]  Password [email or web address]

## 2015-04-05 NOTE — Progress Notes (Signed)
ANTIBIOTIC CONSULT NOTE - INITIAL  Pharmacy Consult for vancomycin/ceftazidime Indication: sepsis  Allergies  Allergen Reactions  . Amoxicillin Other (See Comments)    ON MAR  . Ampicillin Other (See Comments)    ON MAR  . Latex Other (See Comments)    ON MAR  . Morphine And Related Other (See Comments)    UNK  . Penicillins Other (See Comments)    ON MAR; tolerated Ceftriaxone 8/27-9/1  . Tape Other (See Comments)    ON MAR    Patient Measurements: Height: 6' (182.9 cm) Weight: 180 lb 8 oz (81.874 kg) IBW/kg (Calculated) : 77.6 Adjusted Body Weight:  Vital Signs: Temp: 102.5 F (39.2 C) (10/02 1601) Temp Source: Rectal (10/02 1601) BP: 117/62 mmHg (10/02 1601) Pulse Rate: 98 (10/02 1601) Intake/Output from previous day:   Intake/Output from this shift: Total I/O In: -  Out: 130 [Urine:130]  Labs:  Recent Labs  04/05/15 1650  WBC 9.0  HGB 14.6  PLT 174  CREATININE 2.61*   Normalized Crcl 30.  Estimated Creatinine Clearance: 32.6 mL/min (by C-G formula based on Cr of 2.61). No results for input(s): VANCOTROUGH, VANCOPEAK, VANCORANDOM, GENTTROUGH, GENTPEAK, GENTRANDOM, TOBRATROUGH, TOBRAPEAK, TOBRARND, AMIKACINPEAK, AMIKACINTROU, AMIKACIN in the last 72 hours.   Microbiology: No results found for this or any previous visit (from the past 720 hour(s)).  Medical History: Past Medical History  Diagnosis Date  . Hypertension   . Dyslipidemia   . Carpal tunnel syndrome   . A-V fistula ([place])   . Anemia   . Hemorrhoids   . Hyperparathyroidism   . Intracranial hemorrhage ([place])   . Hypotension   . Hyperkalemia   . Kidney disease   . Sepsis(995.91)   . GI bleeding   . DVT (deep venous thrombosis) ([place])   . PVD (peripheral vascular disease) ([place])   . DM (diabetes mellitus) ([place])   . Cellulitis   . IV infiltration     Right upper extremity  . PPD positive   . 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  Hypokinesis     EF of 60% with mild anterior hypokinesis, minimal coronary artery disease on catheterization in December of 2007  . Gout     Medications:  Infusions:  . cefTAZidime (FORTAZ)  IV    . sodium chloride 1,000 mL (04/05/15 1808)   Followed by  . sodium chloride     Assessment: Pharmacy is consulted to dose vancomycin and ceftazidime in 59 yo male diagnosed with sepsis. Pt has a history of renal transplant on tacrolimus and is noncommunicative intracranial bleeding. Pt also has a ulcer/mass on penis with weeping and odor. Recent admission for sepsis/UTI/PNA.   Goal of Therapy:  Vancomycin trough level 15-20 mcg/ml  Plan:  Follow up culture results  Will start Vancomycin 1000 mg IV q24h. Will start ceftazidime 1 gr IV q24h.  Royetta Asal PharmD, BCPS 04/05/15 18:32

## 2015-04-05 NOTE — ED Notes (Signed)
Bed: WA21 Expected date:  Expected time:  Means of arrival:  Comments: EMS-Possible sepsis

## 2015-04-06 ENCOUNTER — Inpatient Hospital Stay (HOSPITAL_COMMUNITY): Payer: Medicare Other

## 2015-04-06 ENCOUNTER — Encounter (HOSPITAL_COMMUNITY): Payer: Self-pay | Admitting: Radiology

## 2015-04-06 DIAGNOSIS — R0600 Dyspnea, unspecified: Secondary | ICD-10-CM

## 2015-04-06 DIAGNOSIS — Z940 Kidney transplant status: Secondary | ICD-10-CM

## 2015-04-06 DIAGNOSIS — J9602 Acute respiratory failure with hypercapnia: Secondary | ICD-10-CM

## 2015-04-06 DIAGNOSIS — J9601 Acute respiratory failure with hypoxia: Secondary | ICD-10-CM

## 2015-04-06 DIAGNOSIS — I5022 Chronic systolic (congestive) heart failure: Secondary | ICD-10-CM

## 2015-04-06 DIAGNOSIS — Z515 Encounter for palliative care: Secondary | ICD-10-CM

## 2015-04-06 LAB — GLUCOSE, CAPILLARY
Glucose-Capillary: 331 mg/dL — ABNORMAL HIGH (ref 65–99)
Glucose-Capillary: 300 mg/dL — ABNORMAL HIGH (ref 65–99)
Glucose-Capillary: 363 mg/dL — ABNORMAL HIGH (ref 65–99)

## 2015-04-06 LAB — SODIUM, URINE, RANDOM: Sodium, Ur: 16 mmol/L

## 2015-04-06 LAB — LACTIC ACID, PLASMA
LACTIC ACID, VENOUS: 2 mmol/L (ref 0.5–2.0)
LACTIC ACID, VENOUS: 3.1 mmol/L — AB (ref 0.5–2.0)
Lactic Acid, Venous: 1.7 mmol/L (ref 0.5–2.0)

## 2015-04-06 LAB — COMPREHENSIVE METABOLIC PANEL
ALBUMIN: 2.6 g/dL — AB (ref 3.5–5.0)
ALT: 37 U/L (ref 17–63)
ANION GAP: 5 (ref 5–15)
AST: 39 U/L (ref 15–41)
Alkaline Phosphatase: 75 U/L (ref 38–126)
BUN: 66 mg/dL — ABNORMAL HIGH (ref 6–20)
CO2: 31 mmol/L (ref 22–32)
Calcium: 9.8 mg/dL (ref 8.9–10.3)
Chloride: 111 mmol/L (ref 101–111)
Creatinine, Ser: 2.25 mg/dL — ABNORMAL HIGH (ref 0.61–1.24)
GFR calc Af Amer: 34 mL/min — ABNORMAL LOW (ref >60)
GFR calc non Af Amer: 30 mL/min — ABNORMAL LOW (ref >60)
GLUCOSE: 329 mg/dL — AB (ref 65–99)
POTASSIUM: 4.7 mmol/L (ref 3.5–5.1)
SODIUM: 147 mmol/L — AB (ref 135–145)
Total Bilirubin: 0.4 mg/dL (ref 0.3–1.2)
Total Protein: 7.3 g/dL (ref 6.5–8.1)

## 2015-04-06 LAB — CBC
HEMATOCRIT: 45 % (ref 39.0–52.0)
HEMATOCRIT: 45.2 % (ref 39.0–52.0)
HEMOGLOBIN: 13 g/dL (ref 13.0–17.0)
HEMOGLOBIN: 13 g/dL (ref 13.0–17.0)
MCH: 27.6 pg (ref 26.0–34.0)
MCH: 27.1 pg (ref 26.0–34.0)
MCHC: 28.8 g/dL — ABNORMAL LOW (ref 30.0–36.0)
MCHC: 28.9 g/dL — AB (ref 30.0–36.0)
MCV: 96 fL (ref 78.0–100.0)
MCV: 93.9 fL (ref 78.0–100.0)
Platelets: 162 10*3/uL (ref 150–400)
Platelets: 151 10*3/uL (ref 150–400)
RBC: 4.71 MIL/uL (ref 4.22–5.81)
RBC: 4.79 MIL/uL (ref 4.22–5.81)
RDW: 15.4 % (ref 11.5–15.5)
RDW: 15.6 % — AB (ref 11.5–15.5)
WBC: 8.3 10*3/uL (ref 4.0–10.5)
WBC: 9.7 10*3/uL (ref 4.0–10.5)

## 2015-04-06 LAB — BLOOD GAS, VENOUS
ACID-BASE EXCESS: 2.6 mmol/L — AB (ref 0.0–2.0)
BICARBONATE: 29.8 meq/L — AB (ref 20.0–24.0)
Delivery systems: POSITIVE
Drawn by: 235321
EXPIRATORY PAP: 6
FIO2: 0.5
Inspiratory PAP: 12
Mode: POSITIVE
O2 Saturation: 75.9 %
PATIENT TEMPERATURE: 98.4
PO2 VEN: 44.8 mmHg (ref 30.0–45.0)
TCO2: 27.3 mmol/L (ref 0–100)
pCO2, Ven: 59.9 mmHg — ABNORMAL HIGH (ref 45.0–50.0)
pH, Ven: 7.317 — ABNORMAL HIGH (ref 7.250–7.300)

## 2015-04-06 LAB — PROCALCITONIN: Procalcitonin: 1.26 ng/mL

## 2015-04-06 LAB — PROTIME-INR
INR: 1.25 (ref 0.00–1.49)
Prothrombin Time: 15.9 seconds — ABNORMAL HIGH (ref 11.6–15.2)

## 2015-04-06 LAB — PHOSPHORUS: PHOSPHORUS: 2.7 mg/dL (ref 2.5–4.6)

## 2015-04-06 LAB — CREATININE, URINE, RANDOM: CREATININE, URINE: 123.15 mg/dL

## 2015-04-06 LAB — MAGNESIUM: MAGNESIUM: 2.4 mg/dL (ref 1.7–2.4)

## 2015-04-06 LAB — TROPONIN I
Troponin I: 0.15 ng/mL — ABNORMAL HIGH (ref <0.031)
Troponin I: 0.09 ng/mL — ABNORMAL HIGH (ref <0.031)

## 2015-04-06 LAB — MRSA PCR SCREENING: MRSA by PCR: NEGATIVE

## 2015-04-06 LAB — TSH: TSH: 0.729 u[IU]/mL (ref 0.350–4.500)

## 2015-04-06 LAB — APTT: APTT: 35 s (ref 24–37)

## 2015-04-06 MED ORDER — CHLORHEXIDINE GLUCONATE 0.12 % MT SOLN
15.0000 mL | Freq: Two times a day (BID) | OROMUCOSAL | Status: DC
  Administered 2015-04-06 – 2015-04-10 (×10): 15 mL via OROMUCOSAL
  Filled 2015-04-06 (×10): qty 15

## 2015-04-06 MED ORDER — PERFLUTREN LIPID MICROSPHERE
1.0000 mL | INTRAVENOUS | Status: AC | PRN
  Administered 2015-04-06: 2 mL via INTRAVENOUS
  Filled 2015-04-06: qty 10

## 2015-04-06 MED ORDER — INFLUENZA VAC SPLIT QUAD 0.5 ML IM SUSY
0.5000 mL | PREFILLED_SYRINGE | INTRAMUSCULAR | Status: DC
  Filled 2015-04-06 (×2): qty 0.5

## 2015-04-06 MED ORDER — CEFTAZIDIME 1 G IJ SOLR
1.0000 g | Freq: Two times a day (BID) | INTRAMUSCULAR | Status: DC
  Administered 2015-04-06 – 2015-04-09 (×8): 1 g via INTRAVENOUS
  Filled 2015-04-06 (×9): qty 1

## 2015-04-06 MED ORDER — INSULIN GLARGINE 100 UNIT/ML ~~LOC~~ SOLN
10.0000 [IU] | Freq: Every day | SUBCUTANEOUS | Status: DC
  Administered 2015-04-06 – 2015-04-09 (×4): 10 [IU] via SUBCUTANEOUS
  Filled 2015-04-06 (×4): qty 0.1

## 2015-04-06 MED ORDER — CETYLPYRIDINIUM CHLORIDE 0.05 % MT LIQD
7.0000 mL | Freq: Two times a day (BID) | OROMUCOSAL | Status: DC
  Administered 2015-04-06 – 2015-04-10 (×9): 7 mL via OROMUCOSAL

## 2015-04-06 MED ORDER — INSULIN ASPART 100 UNIT/ML ~~LOC~~ SOLN
0.0000 [IU] | SUBCUTANEOUS | Status: DC
  Administered 2015-04-06 – 2015-04-07 (×2): 5 [IU] via SUBCUTANEOUS
  Administered 2015-04-07: 3 [IU] via SUBCUTANEOUS
  Administered 2015-04-07: 2 [IU] via SUBCUTANEOUS
  Administered 2015-04-07 (×3): 5 [IU] via SUBCUTANEOUS
  Administered 2015-04-08: 3 [IU] via SUBCUTANEOUS
  Administered 2015-04-08 (×2): 2 [IU] via SUBCUTANEOUS
  Administered 2015-04-08: 3 [IU] via SUBCUTANEOUS
  Administered 2015-04-08: 5 [IU] via SUBCUTANEOUS
  Administered 2015-04-08: 3 [IU] via SUBCUTANEOUS
  Administered 2015-04-09: 2 [IU] via SUBCUTANEOUS
  Administered 2015-04-09: 3 [IU] via SUBCUTANEOUS
  Administered 2015-04-09: 2 [IU] via SUBCUTANEOUS
  Administered 2015-04-09 (×3): 3 [IU] via SUBCUTANEOUS
  Administered 2015-04-10 (×4): 5 [IU] via SUBCUTANEOUS

## 2015-04-06 MED ORDER — VANCOMYCIN HCL 10 G IV SOLR
1250.0000 mg | INTRAVENOUS | Status: DC
  Administered 2015-04-06 – 2015-04-08 (×3): 1250 mg via INTRAVENOUS
  Filled 2015-04-06 (×5): qty 1250

## 2015-04-06 MED ORDER — SODIUM CHLORIDE 0.9 % IV BOLUS (SEPSIS)
250.0000 mL | Freq: Once | INTRAVENOUS | Status: AC
  Administered 2015-04-06: 250 mL via INTRAVENOUS

## 2015-04-06 MED ORDER — ASPIRIN 81 MG PO CHEW
81.0000 mg | CHEWABLE_TABLET | Freq: Every day | ORAL | Status: DC
  Administered 2015-04-06 – 2015-04-10 (×5): 81 mg via ORAL
  Filled 2015-04-06 (×5): qty 1

## 2015-04-06 MED ORDER — PERFLUTREN LIPID MICROSPHERE
INTRAVENOUS | Status: AC
  Filled 2015-04-06: qty 10

## 2015-04-06 NOTE — Progress Notes (Signed)
I contacted patient's daughter to set up a meeting for goals of care discussion and to provide support to patient';s daughters. I spoke with April Fiallo- she was resistant to consultation and angry -she  said "so what the other doctor contacted you to talk me into making my dad a DNR and giving up on my dad?" She went on to tell me they had a palliative consultation 6 years ago and they told her her that her dad would be a vegetable and "he isnt". I asked April to consider what her dad would tell her given everything he has been through and looking at the current quality of life and explained that I had no agenda other than to help find her dad's voice in all of this discussion. I explained that I would be happy to meet and not discuss code status at all. I tentatively scheduled for 5:30 Wednesday as this is the only day and time she is available-she also told me that she would talk to her sister and they may not choose to attend the meeting. Will follow.  Lane Hacker, DO Palliative Medicine [phone]

## 2015-04-06 NOTE — Consult Note (Signed)
Renal Service Consult Note [facility]  Justin [name] 04/06/2015 Justin [name] D Requesting Physician:  Dr Karleen Hampshire  Reason for Consult:  Acute / chronic renal failure, renal transplant HPI: The patient is a 59 y.o. year-old with hx of HTN, HL, DVT, DM2, PVD, GIB , hx fall/ SDH w R hemiparesis/ PEG/ bedridden/ lives in [place]. 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 Patient presented with sent from [facility] where he was nonverbal and recently dx'd with PNA. Had temp 102 deg at the SNF also. He has a renal Tx on prograf and prednisone.   Creat here 2.6 on admission and 2.25 today.  Back in April and Aug this year , creat was around 1.5- 1.8 at baseline.   Patient had renal Tx April 2008 at [facility].  Has been at [facility] since March 2009.  He was on dialysis prior to receiving the transplant for about 6 years. The daughter says that he has not been able to reliably communicate or understand things since his fall in 2008 and that she / family are making decisions.      Chart review: April '16 > hx of ICH/ R hemiparesis/ aphasia , CKD, DM, kidney Tx admitted for poor po intake at [facility]. Had AKI rx with IVF's, high K which resolved.  Sept '16 > fevers, ECOli UTI and mild sepsis , improved with IV abx, UCX grew ECOli. Creat bumped then returend to baseline 1.4-1.7. Dementia w/o acute TME or behav disturbance. DM/HTN   ROS  no CP , no SOB  no n/v/d  no rash or joint pain  Past Medical History  Past Medical History  Diagnosis Date  . Hypertension   . Dyslipidemia   . Carpal tunnel syndrome   . A-V fistula ([place])   . Anemia   . Hemorrhoids   . Hyperparathyroidism   . Intracranial hemorrhage ([place])   . Hypotension   . Hyperkalemia   . Kidney disease   . Sepsis(995.91)   . GI bleeding   . DVT (deep venous thrombosis) ([place])   . PVD (peripheral vascular disease) ([place])   . DM (diabetes mellitus) ([place])   . Cellulitis   . IV infiltration      Right upper extremity  . PPD positive   . 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 Hypokinesis     EF of 60% with mild anterior hypokinesis, minimal coronary artery disease on catheterization in December of 2007  . Gout    Past Surgical History  Past Surgical History  Procedure Laterality Date  . Kidney transplant      History of renal transplant maintained on chronic immunosuppressive therapy  . Av fistula placement      Right arm  . Peripherally inserted central catheter insertion      line placement  . Amputation    . Gastrostomy    . 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 Brain biopsy    . #6 shiley cuff      Less trache   Family History  Family History  Problem Relation Age of Onset  . Heart disease Mother   . CAD Mother   . Diabetes type II Mother   . Hypertension Mother   . Cancer Neg Hx    Social History  reports that he quit smoking about 14 years ago. He does not have any smokeless tobacco history on file.  He reports that he uses illicit drugs. He reports that he does not drink alcohol. Allergies  Allergies  Allergen Reactions  . Amoxicillin Other (See Comments)    ON MAR  . Ampicillin Other (See Comments)    ON MAR  . Latex Other (See Comments)    ON MAR  . Morphine And Related Other (See Comments)    UNK  . Penicillins Other (See Comments)    ON MAR; tolerated Ceftriaxone 8/27-9/1  . Tape Other (See Comments)    ON MAR   Home medications Prior to Admission medications   Medication Sig Start Date End Date Taking? Authorizing Provider  aspirin 81 MG tablet Take 81 mg by mouth daily.     Yes Historical Provider, MD  atorvastatin (LIPITOR) 10 MG tablet Take 10 mg by mouth daily.   Yes Historical Provider, MD  citalopram (CELEXA) 10 MG tablet Take 20 mg by mouth daily.    Yes Historical Provider, MD  Cranberry 425 MG CAPS Take 425 mg by mouth daily.   Yes Historical Provider, MD   Dextromethorphan-Quinidine 20-10 MG CAPS Take 1 capsule by mouth every 12 (twelve) hours.   Yes Historical Provider, MD  fentaNYL (DURAGESIC - DOSED MCG/HR) 25 MCG/HR patch Place 25 mcg onto the skin every 3 (three) days.   Yes Historical Provider, MD  gabapentin (NEURONTIN) 300 MG capsule Take 300 mg by mouth 3 (three) times daily.  05/20/13  Yes Historical Provider, MD  hydrALAZINE (APRESOLINE) 25 MG tablet Take 25 mg by mouth 2 (two) times daily.   Yes Historical Provider, MD  insulin glargine (LANTUS) 100 UNIT/ML injection Inject 0.1 mLs (10 Units total) into the skin at bedtime. 10/21/14  Yes Florencia Reasons, MD  insulin lispro (HUMALOG KWIKPEN) 100 UNIT/ML KiwkPen Inject 9 Units into the skin 3 (three) times daily before meals.   Yes Historical Provider, MD  isosorbide dinitrate (ISORDIL) 10 MG tablet Take 10 mg by mouth 2 (two) times daily.   Yes Historical Provider, MD  lamoTRIgine (LAMICTAL) 25 MG tablet Take 50 mg by mouth daily.   Yes Historical Provider, MD  loperamide (IMODIUM A-D) 2 MG tablet Take 2 mg by mouth daily as needed for diarrhea or loose stools. Max 16 mg per day   Yes Historical Provider, MD  LORazepam (ATIVAN) 0.5 MG tablet Take 0.5 mg by mouth every 8 (eight) hours as needed. Anxiety   Yes Historical Provider, MD  LORazepam (ATIVAN) 2 MG/ML injection Inject 1 mg into the vein every 6 (six) hours as needed for anxiety.   Yes Historical Provider, MD  Nutritional Supplements (NUTRITIONAL DRINK PO) Take 30 mLs by mouth daily.   Yes Historical Provider, MD  omeprazole (PRILOSEC) 20 MG capsule Take 20 mg by mouth daily.     Yes Historical Provider, MD  ondansetron (ZOFRAN) 4 MG tablet Take 4 mg by mouth every 8 (eight) hours as needed for nausea or vomiting.   Yes Historical Provider, MD  oxyCODONE-acetaminophen (PERCOCET) 7.5-325 MG per tablet Take 1 tablet by mouth every 4 (four) hours as needed for severe pain. 03/06/15  Yes Charlynne Cousins, MD  potassium & sodium phosphates  (PHOS-NAK) 280-160-250 MG PACK Take 1 packet by mouth 4 (four) times daily -  with meals and at bedtime.   Yes Historical Provider, MD  predniSONE (DELTASONE) 5 MG tablet Take 5 mg by mouth daily. 03/30/15  Yes Historical Provider, MD  promethazine (PHENERGAN) 25 MG/ML injection Inject 25 mg into the vein every 6 (  six) hours as needed for nausea or vomiting.   Yes Historical Provider, MD  senna-docusate (SENOKOT-S) 8.6-50 MG per tablet Take 1 tablet by mouth daily.   Yes Historical Provider, MD  sucralfate (CARAFATE) 1 GM/10ML suspension Take 1 g by mouth every 8 (eight) hours.   Yes Historical Provider, MD  tacrolimus (PROGRAF) 0.5 MG capsule Take 0.5 mg by mouth 2 (two) times daily.   Yes Historical Provider, MD  tacrolimus (PROGRAF) 1 MG capsule Take 3 mg by mouth 2 (two) times daily.   Yes Historical Provider, MD  tiZANidine (ZANAFLEX) 2 MG tablet Take 2 mg by mouth 2 (two) times daily.  05/26/13  Yes Historical Provider, MD  Vitamin D, Ergocalciferol, (DRISDOL) 50000 UNITS CAPS Take 50,000 Units by mouth every 30 (thirty) days.   Yes Historical Provider, MD  Water For Irrigation, Sterile (FREE WATER) SOLN Place 200 mLs into feeding tube every 8 (eight) hours. 03/06/15  Yes Charlynne Cousins, MD  allopurinol (ZYLOPRIM) 100 MG tablet Take 100 mg by mouth daily.      Historical Provider, MD  levETIRAcetam (KEPPRA) 100 MG/ML solution Place 5 mLs (500 mg total) into feeding tube 2 (two) times daily. Patient not taking: Reported on 04/05/2015 03/06/15   Charlynne Cousins, MD  Nutritional Supplements (FEEDING SUPPLEMENT, GLUCERNA 1.2 CAL,) LIQD Place 1,000 mLs into feeding tube continuous. Patient not taking: Reported on 04/05/2015 03/06/15   Charlynne Cousins, MD   Liver Function Tests  Recent Labs Lab 04/05/15 1650 04/06/15 0430  AST 55* 39  ALT 42 37  ALKPHOS 71 75  BILITOT 0.9 0.4  PROT 8.1 7.3  ALBUMIN 2.8* 2.6*   No results for input(s): LIPASE, AMYLASE in the last 168  hours. CBC  Recent Labs Lab 04/05/15 1650 04/06/15 0043 04/06/15 0430  WBC 9.0 9.7 8.3  NEUTROABS 7.0  --   --   HGB 14.6 13.0 13.0  HCT 49.9 45.2 45.0  MCV 95.2 96.0 93.9  PLT 174 162 885   Basic Metabolic Panel  Recent Labs Lab 04/05/15 1650 04/05/15 2244 04/06/15 0430  NA 145 146* 147*  K 5.7* 5.1 4.7  CL 108 110 111  CO2 34* 28 31  GLUCOSE 326* 322* 329*  BUN 73* 71* 66*  CREATININE 2.61* 2.28* 2.25*  CALCIUM 10.4* 10.0 9.8  PHOS  --   --  2.7    Filed Vitals:   04/06/15 0600 04/06/15 0700 04/06/15 0800 04/06/15 0849  BP: 131/29  156/37 156/37  Pulse: 70 65 66 68  Temp: 97.9 F (36.6 C) 97.9 F (36.6 C) 97.6 F (36.4 C) 97.7 F (36.5 C)  TempSrc:   Axillary   Resp: 16 14 15 13   Height:      Weight:      SpO2: 100% 100% 100% 100%   Exam On bipap, tries to verbalize with some success No rash, cyanosis or gangrene Sclera anicteric, throat clear +JVD Chest clear ant and lat bilat RRR no MRG noted Abd soft ntnd +BS PEG tube in place Penile fungating sessile skin changes surrounding penis, foley in place No LE edema No movement of bilat LE's or RUE.  LUE 4/5 strength Neuro responsive, minimally verbal  UA tntc wbc's, 3-6 rbc, 100 prot CT pelvis on 10/3 > skin thickening lat and post to penile shaft, possible PTLD. Stable urothelial thickening in R renal Tx collecting system.  CXR bilat IS infiltrates poss [facility] meds > asa, lipitor, celexa, duragesic patch, neurontin, hydralazine, lantus,  Humalog, Isordil, Lamictal, ativan, prilosec, zofran, Phos-NAK, prednisone, carafate, prograf, zanaflex, allopurinol, vit D , Keppra, tube feeds  Baseline crea 1.5- 1.8 April/ Aug this year   Assessment: 1 Acute on CKD 3 of renal transplant , probable AKI component due to sepsis. Improving. BP's up.  2 Dyspnea - on bipap, unclear PNA vs aspiration vs edema 3 Fall/ SDH w R hemiparesis chronic 4 Feeding G-tube 5 Seizure d/o 6 HTN  7 Chronic pain 8  DM 9 Hx lymphoma rx with whole-brain irradiation 10 Full code 11 Volume unclear, doesn't look wet on exam   Plan- supportive care for now, cont Tx meds, repeat CXR in am. Spoke w daughter and encouraged DNR status for this debilitated patient w minimal understanding of his condition. She said she will think about it.   Kelly Splinter MD (pgr) [phone]    ([phone] 04/06/2015, 12:25 PM

## 2015-04-06 NOTE — Consult Note (Signed)
PULMONARY / CRITICAL CARE MEDICINE   Name: Justin [name] MRN: [id number] DOB: 19-Mar-1956    ADMISSION DATE:  04/05/2015 CONSULTATION DATE:  04/06/2015   REFERRING MD :  Dr Karleen Hampshire  CHIEF COMPLAINT:  Respiratory  failure   SIGNIFICANT EVENTS:  - 04/05/2015 - > admit    HISTORY OF PRESENT ILLNESS:    Justin [name] 1955-12-23 59 y.o. admitted 04/05/2015 by the triad hospitalist team for encephalopathy and hypoxemic acute on chronic respiratory failure. Pulmonary critical care has been consulted due to the same. History is only available from the chart and talking to the bedside nurse. Patient due to encephalopathy is unable to provide a history. As best as I can gather he is a nursing home patient with multiple medical problems including status post renal transplant. Most recently was hospitalized 02/25/2015 through 03/06/2015 in our health system with sepsis secondary to Escherichia coli from the urine. He did have encephalopathy at that admission as well. He did have a PEG tube placed in September 1 for that admission. Of note according to the nurse was a question of penile ulcer at that time with recommendation to follow-up with dermatology or plastics.   Currently, in his nursing home he was found to be febrile and confused and lethargic and therefore was admitted to the intensive care unit. In the intensive care unit chest x-ray shows pulmonary infiltrates. His is also question of urinary tract infection. Broad-spectrum anabiotic's and BiPAP was started. His lethargy did not improve her Narcan at admission or prior to admission but seems to slowly improved since admission here.  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 He is noted to be on chronic prednisone and tacrolimus for the renal transplant but per chart does not follow up with nephrology. For this when he was  admitted at [facility] he sustained intracranial hemorrhage resulting in prominent right-sided weakness and dysphagia resulting in PEG tube and tracheostomy. All this was in 2009. Since then he's been in nursing home. At that point in time his lymphoma was treated with whole brain radiation  There is also history of DVT status post IVC filter in 2009.  He also has history of chronic systolic heart failure with ejection fraction 35% in 2014. No follow-up echo as noted in our computer system   PAST MEDICAL HISTORY :    has a past medical history of Hypertension; Dyslipidemia; Carpal tunnel syndrome; A-V fistula ([place]); Anemia; Hemorrhoids; Hyperparathyroidism; Intracranial hemorrhage ([place]); Hypotension; Hyperkalemia; Kidney disease; Sepsis(995.91); GI bleeding; DVT (deep venous thrombosis) ([facility]); PVD (peripheral vascular disease) ([place]); DM (diabetes mellitus) ([place]); Cellulitis; IV infiltration; PPD positive; Intracerebral bleed ([place]); Hypokinesis; and Gout.  has past surgical history that includes Kidney transplant; AV fistula placement; Peripherally inserted central catheter insertion; Amputation; Gastrostomy; IVC filter; Brain Biopsy; and #6 Shiley Cuff. Prior to Admission medications   Medication Sig Start Date End Date Taking? Authorizing Provider  aspirin 81 MG tablet Take 81 mg by mouth daily.     Yes Historical Provider, MD  atorvastatin (LIPITOR) 10 MG tablet Take 10 mg by mouth daily.   Yes Historical Provider, MD  citalopram (CELEXA) 10 MG tablet Take 20 mg by mouth daily.    Yes Historical Provider, MD  Cranberry 425 MG CAPS Take 425 mg by mouth daily.   Yes Historical Provider, MD  Dextromethorphan-Quinidine 20-10 MG CAPS Take 1 capsule by mouth every 12 (  twelve) hours.   Yes Historical Provider, MD  fentaNYL (DURAGESIC - DOSED MCG/HR) 25 MCG/HR patch Place 25 mcg onto the skin every 3 (three) days.   Yes Historical Provider, MD  gabapentin (NEURONTIN) 300 MG capsule Take 300 mg by mouth  3 (three) times daily.  05/20/13  Yes Historical Provider, MD  hydrALAZINE (APRESOLINE) 25 MG tablet Take 25 mg by mouth 2 (two) times daily.   Yes Historical Provider, MD  insulin glargine (LANTUS) 100 UNIT/ML injection Inject 0.1 mLs (10 Units total) into the skin at bedtime. 10/21/14  Yes Florencia Reasons, MD  insulin lispro (HUMALOG KWIKPEN) 100 UNIT/ML KiwkPen Inject 9 Units into the skin 3 (three) times daily before meals.   Yes Historical Provider, MD  isosorbide dinitrate (ISORDIL) 10 MG tablet Take 10 mg by mouth 2 (two) times daily.   Yes Historical Provider, MD  lamoTRIgine (LAMICTAL) 25 MG tablet Take 50 mg by mouth daily.   Yes Historical Provider, MD  loperamide (IMODIUM A-D) 2 MG tablet Take 2 mg by mouth daily as needed for diarrhea or loose stools. Max 16 mg per day   Yes Historical Provider, MD  LORazepam (ATIVAN) 0.5 MG tablet Take 0.5 mg by mouth every 8 (eight) hours as needed. Anxiety   Yes Historical Provider, MD  LORazepam (ATIVAN) 2 MG/ML injection Inject 1 mg into the vein every 6 (six) hours as needed for anxiety.   Yes Historical Provider, MD  Nutritional Supplements (NUTRITIONAL DRINK PO) Take 30 mLs by mouth daily.   Yes Historical Provider, MD  omeprazole (PRILOSEC) 20 MG capsule Take 20 mg by mouth daily.     Yes Historical Provider, MD  ondansetron (ZOFRAN) 4 MG tablet Take 4 mg by mouth every 8 (eight) hours as needed for nausea or vomiting.   Yes Historical Provider, MD  oxyCODONE-acetaminophen (PERCOCET) 7.5-325 MG per tablet Take 1 tablet by mouth every 4 (four) hours as needed for severe pain. 03/06/15  Yes Charlynne Cousins, MD  potassium & sodium phosphates (PHOS-NAK) 280-160-250 MG PACK Take 1 packet by mouth 4 (four) times daily -  with meals and at bedtime.   Yes Historical Provider, MD  predniSONE (DELTASONE) 5 MG tablet Take 5 mg by mouth daily. 03/30/15  Yes Historical Provider, MD  promethazine (PHENERGAN) 25 MG/ML injection Inject 25 mg into the vein every 6 (six)  hours as needed for nausea or vomiting.   Yes Historical Provider, MD  senna-docusate (SENOKOT-S) 8.6-50 MG per tablet Take 1 tablet by mouth daily.   Yes Historical Provider, MD  sucralfate (CARAFATE) 1 GM/10ML suspension Take 1 g by mouth every 8 (eight) hours.   Yes Historical Provider, MD  tacrolimus (PROGRAF) 0.5 MG capsule Take 0.5 mg by mouth 2 (two) times daily.   Yes Historical Provider, MD  tacrolimus (PROGRAF) 1 MG capsule Take 3 mg by mouth 2 (two) times daily.   Yes Historical Provider, MD  tiZANidine (ZANAFLEX) 2 MG tablet Take 2 mg by mouth 2 (two) times daily.  05/26/13  Yes Historical Provider, MD  Vitamin D, Ergocalciferol, (DRISDOL) 50000 UNITS CAPS Take 50,000 Units by mouth every 30 (thirty) days.   Yes Historical Provider, MD  Water For Irrigation, Sterile (FREE WATER) SOLN Place 200 mLs into feeding tube every 8 (eight) hours. 03/06/15  Yes Charlynne Cousins, MD  allopurinol (ZYLOPRIM) 100 MG tablet Take 100 mg by mouth daily.      Historical Provider, MD  levETIRAcetam (KEPPRA) 100 MG/ML solution Place 5 mLs (  500 mg total) into feeding tube 2 (two) times daily. Patient not taking: Reported on 04/05/2015 03/06/15   Charlynne Cousins, MD  Nutritional Supplements (FEEDING SUPPLEMENT, GLUCERNA 1.2 CAL,) LIQD Place 1,000 mLs into feeding tube continuous. Patient not taking: Reported on 04/05/2015 03/06/15   Charlynne Cousins, MD   Allergies  Allergen Reactions  . Amoxicillin Other (See Comments)    ON MAR  . Ampicillin Other (See Comments)    ON MAR  . Latex Other (See Comments)    ON MAR  . Morphine And Related Other (See Comments)    UNK  . Penicillins Other (See Comments)    ON MAR; tolerated Ceftriaxone 8/27-9/1  . Tape Other (See Comments)    ON MAR    FAMILY HISTORY:  indicated that his mother is deceased. He indicated that his father is deceased.  SOCIAL HISTORY:  reports that he quit smoking about 14 years ago. He does not have any smokeless tobacco history  on file. He reports that he uses illicit drugs. He reports that he does not drink alcohol.  REVIEW OF SYSTEMS:   - per HPI  VITAL SIGNS: Temp:  [97.6 F (36.4 C)-102.5 F (39.2 C)] 97.7 F (36.5 C) (10/03 0849) Pulse Rate:  [65-98] 68 (10/03 0849) Resp:  [0-22] 13 (10/03 0849) BP: (99-177)/(16-115) 156/37 mmHg (10/03 0849) SpO2:  [96 %-100 %] 100 % (10/03 0849) FiO2 (%):  [40 %] 40 % (10/03 0849) Weight:  [81.874 kg (180 lb 8 oz)] 81.874 kg (180 lb 8 oz) (10/02 1712) HEMODYNAMICS:   VENTILATOR SETTINGS: Vent Mode:  [-]  FiO2 (%):  [40 %] 40 % INTAKE / OUTPUT:  Intake/Output Summary (Last 24 hours) at 04/06/15 1610 Last data filed at 04/06/15 0800  Gross per 24 hour  Intake    550 ml  Output   1080 ml  Net   -530 ml    PHYSICAL EXAMINATION: General:  Ill looking male on BiPAP Neuro:  RASS -2 equivalent on BiPAP. He moans and groans. He did look at me and stair. HEENT:  BiPAP present Cardiovascular:  Regular rate and rhythm normal heart sounds Lungs:  Clear to auscultation bilaterally. Mildly tachypneic but not paradoxical. No use of accessory muscles Abdomen:  Obese soft Musculoskeletal:  No obvious edema Skin:  Intact anteriorly  LABS:   PULMONARY  Recent Labs Lab 04/05/15 1728 04/05/15 2120  PHART 7.330*  --   PCO2ART 59.6*  --   PO2ART 81.6  --   HCO3 29.9* 29.8*  TCO2 26.9 27.3  O2SAT 94.1 75.9    CBC  Recent Labs Lab 04/05/15 1650 04/06/15 0043 04/06/15 0430  HGB 14.6 13.0 13.0  HCT 49.9 45.2 45.0  WBC 9.0 9.7 8.3  PLT 174 162 151    COAGULATION  Recent Labs Lab 04/06/15 0043  INR 1.25    CARDIAC   Recent Labs Lab 04/05/15 2244 04/06/15 0430  TROPONINI 0.23* 0.15*   No results for input(s): PROBNP in the last 168 hours.   CHEMISTRY  Recent Labs Lab 04/05/15 1650 04/05/15 2244 04/06/15 0430  NA 145 146* 147*  K 5.7* 5.1 4.7  CL 108 110 111  CO2 34* 28 31  GLUCOSE 326* 322* 329*  BUN 73* 71* 66*  CREATININE 2.61*  2.28* 2.25*  CALCIUM 10.4* 10.0 9.8  MG  --   --  2.4  PHOS  --   --  2.7   Estimated Creatinine Clearance: 37.8 mL/min (by C-G formula based on  Cr of 2.25).   LIVER  Recent Labs Lab 04/05/15 1650 04/06/15 0043 04/06/15 0430  AST 55*  --  39  ALT 42  --  37  ALKPHOS 71  --  75  BILITOT 0.9  --  0.4  PROT 8.1  --  7.3  ALBUMIN 2.8*  --  2.6*  INR  --  1.25  --      INFECTIOUS  Recent Labs Lab 04/05/15 1650  04/05/15 2143 04/06/15 0043 04/06/15 0430 04/06/15 0630  LATICACIDVEN  --   < > 2.1* 3.1*  --  2.0  PROCALCITON 1.90  --   --   --  1.26  --   < > = values in this interval not displayed.   ENDOCRINE CBG (last 3)   Recent Labs  04/05/15 2235 04/06/15 0313 04/06/15 0740  GLUCAP 295* 331* 300*         IMAGING x48h  - image(s) personally visualized  -   highlighted in bold Ct Head Wo Contrast  04/05/2015   CLINICAL DATA:  Altered mental status. Patient reported to be altered from baseline. Patient nonverbal. No response of symptoms to or chronic reversal.  EXAM: CT HEAD WITHOUT CONTRAST  TECHNIQUE: Contiguous axial images were obtained from the base of the skull through the vertex without intravenous contrast.  COMPARISON:  03/01/2015.  FINDINGS: Generalized atrophy. Chronic microvascular ischemic change. 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 Compensatory enlargement LEFT lateral ventricle.  No acute stroke or acute hemorrhage. No extra-axial fluid. No mass lesion.  There is evidence for a LEFT frontal burr hole, otherwise the calvarium is intact. There is no acute sinus or mastoid disease. No change from priors.  IMPRESSION: Remote LEFT hemisphere ischemic event is stable. No acute intracranial findings. No posttraumatic sequelae are evident.   Electronically Signed   By: Staci Righter M.D.   On: 04/05/2015 17:53   Dg Chest Port 1  View  04/05/2015   CLINICAL DATA:  Altered mental status, hypotension, diabetes  EXAM: PORTABLE CHEST 1 VIEW  COMPARISON:  03/01/2015  FINDINGS: Cardiac size exaggerated by kyphotic positioning and low inspiratory effect. Allowing for this is likely upper normal. Vascular pattern appears without pulmonary venous hypertension. There are increased interstitial markings as well as perihilar infiltrates bilaterally. These are new from the prior study.  IMPRESSION: Appearance suggests pulmonary edema is a possibility. Bilateral lower lobe pneumonia could also be considered.   Electronically Signed   By: Skipper Cliche M.D.   On: 04/05/2015 16:48      Anti-infectives    Start     Dose/Rate Route Frequency Ordered Stop   04/06/15 1700  vancomycin (VANCOCIN) IVPB 1000 mg/200 mL premix  Status:  Discontinued     1,000 mg 200 mL/hr over 60 Minutes Intravenous Every 24 hours 04/05/15 1837 04/06/15 0713   04/06/15 1400  vancomycin (VANCOCIN) 1,250 mg in sodium chloride 0.9 % 250 mL IVPB     1,250 mg 166.7 mL/hr over 90 Minutes Intravenous Every 24 hours 04/06/15 0713     04/06/15 1000  cefTAZidime (FORTAZ) 1 g in dextrose 5 % 50 mL IVPB     1 g 100 mL/hr over 30 Minutes Intravenous Every 12 hours 04/06/15 0713     04/05/15 1830  cefTAZidime (FORTAZ) 1 g in dextrose 5 % 50 mL IVPB  Status:  Discontinued     1 g  100 mL/hr over 30 Minutes Intravenous Every 24 hours 04/05/15 1826 04/06/15 0713   04/05/15 1615  piperacillin-tazobactam (ZOSYN) IVPB 3.375 g     3.375 g 100 mL/hr over 30 Minutes Intravenous  Once 04/05/15 1614 04/05/15 1826   04/05/15 1615  vancomycin (VANCOCIN) IVPB 1000 mg/200 mL premix     1,000 mg 200 mL/hr over 60 Minutes Intravenous  Once 04/05/15 1614 04/05/15 1808       ASSESSMENT / PLAN:  PULMONARY BiPaAP 04/05/15>>  A: Acute resp failure hypoxemic needing BiPAP. Also some amount of hypercarbia. He has pulmonary infiltrates. BiPAP need is due to encephalopathy and pulmonary  infiltrates.  - Based on description suspect slightly improved since admission   P:   Continue BiPAP Treatment infiltrates with anabiotic's for HCAP plus minus diuresis Intubate if he gets worse If intubated consider bronchoscopy due to immunosuppressed status  CARDIOVASCULAR  A: History of chronic systolic heart failure P:  Repeat echocardiogram  RENAL  Recent Labs Lab 04/05/15 1650 04/05/15 2244 04/06/15 0430  CREATININE 2.61* 2.28* 2.25*     A:  Status post renal transplant. He is on immunosuppressants. Does not follow with renal. Baseline creatinine is 1.5 mg percent -Currently acute on chronic kidney insufficiency  P:   Recommend triad hospitalist call renal consult Transplant medications per primary team  GASTROINTESTINAL A:  Respiratory distress. Nursing reports mass in abd P:   Nothing by mouth Recommend swallow eval when better especially given previous stroke to rule out dysphagia Await CT abdomen  HEMATOLOGIC A:  Nil acute P:  Monitor DVT prophylaxis according to primary team  INFECTIOUS A:  High pretest probability for HCAP along with mild lactic acidosis that has cleared P:   Await cultures Antibiotic's per primary team   ENDOCRINE A:  Per primary team P:   Per primary team  NEUROLOGIC A:  Acute encephalopathy. Probably related to sepsis. Did not respond to Narcan.? Slowly improving. At risk for. Opioid Withdrawal  Crretn RASS -2  P:   RASS goal: *0  Use Precedex if needed.  Currently avoid opiates and benzos    FAMILY  - Updates:No Family bedside - Inter-disciplinary family meet or Palliative Care meeting due by:  04/12/2015   TODAY'S SUMMARY: Respiratory distress due to age Versus pulmonary edema and will get echo. Continue BiPAP. Intubate if worsens. Full code. He is immunosuppressed if he does not improve he might need bronchoscopy    The patient is critically ill with multiple organ systems failure and requires high  complexity decision making for assessment and support, frequent evaluation and titration of therapies, application of advanced monitoring technologies and extensive interpretation of multiple databases.   Critical Care Time devoted to patient care services described in this note is  40  Minutes. This time reflects time of care of this signee Dr Brand Males. This critical care time does not reflect procedure time, or teaching time or supervisory time of PA/NP/Med student/Med Resident etc but could involve care discussion time    Dr. Brand Males, M.D., [facility].C.P Pulmonary and Critical Care Medicine Staff Physician [place] [facility] Pager: [phone], If no answer or between  15:00h - 7:00h: call [phone]  04/06/2015 10:01 AM

## 2015-04-06 NOTE — Progress Notes (Signed)
ANTIBIOTIC CONSULT NOTE - FOLLOW UP  Pharmacy Consult for Vancomycin and Ceftazidime Indication: rule out sepsis, pneumonia  Allergies  Allergen Reactions  . Amoxicillin Other (See Comments)    ON MAR  . Ampicillin Other (See Comments)    ON MAR  . Latex Other (See Comments)    ON MAR  . Morphine And Related Other (See Comments)    UNK  . Penicillins Other (See Comments)    ON MAR; tolerated Ceftriaxone 8/27-9/1  . Tape Other (See Comments)    ON MAR    Patient Measurements: Height: 6' (182.9 cm) Weight: 180 lb 8 oz (81.874 kg) IBW/kg (Calculated) : 77.6  Vital Signs: Temp: 97.9 F (36.6 C) (10/03 0600) BP: 131/29 mmHg (10/03 0600) Pulse Rate: 70 (10/03 0600) Intake/Output from previous day: 10/02 0701 - 10/03 0700 In: 550 [IV Piggyback:550] Out: 905 [Urine:905] Intake/Output from this shift:    Labs:  Recent Labs  04/05/15 1650 04/05/15 2244 04/06/15 0043 04/06/15 0430  WBC 9.0  --  9.7 8.3  HGB 14.6  --  13.0 13.0  PLT 174  --  162 151  CREATININE 2.61* 2.28*  --  2.25*   Estimated Creatinine Clearance: 37.8 mL/min (by C-G formula based on Cr of 2.25). No results for input(s): VANCOTROUGH, VANCOPEAK, VANCORANDOM, GENTTROUGH, GENTPEAK, GENTRANDOM, TOBRATROUGH, TOBRAPEAK, TOBRARND, AMIKACINPEAK, AMIKACINTROU, AMIKACIN in the last 72 hours.   Microbiology: Recent Results (from the past 720 hour(s))  MRSA PCR Screening     Status: None   Collection Time: 04/05/15  9:44 PM  Result Value Ref Range Status   MRSA by PCR NEGATIVE NEGATIVE Final    Comment:        The GeneXpert MRSA Assay (FDA approved for NASAL specimens only), is one component of a comprehensive MRSA colonization surveillance program. It is not intended to diagnose MRSA infection nor to guide or monitor treatment for MRSA infections.     Anti-infectives    Start     Dose/Rate Route Frequency Ordered Stop   04/06/15 1700  vancomycin (VANCOCIN) IVPB 1000 mg/200 mL premix  Status:   Discontinued     1,000 mg 200 mL/hr over 60 Minutes Intravenous Every 24 hours 04/05/15 1837 04/06/15 0713   04/06/15 1400  vancomycin (VANCOCIN) 1,250 mg in sodium chloride 0.9 % 250 mL IVPB     1,250 mg 166.7 mL/hr over 90 Minutes Intravenous Every 24 hours 04/06/15 0713     04/06/15 1000  cefTAZidime (FORTAZ) 1 g in dextrose 5 % 50 mL IVPB     1 g 100 mL/hr over 30 Minutes Intravenous Every 12 hours 04/06/15 0713     04/05/15 1830  cefTAZidime (FORTAZ) 1 g in dextrose 5 % 50 mL IVPB  Status:  Discontinued     1 g 100 mL/hr over 30 Minutes Intravenous Every 24 hours 04/05/15 1826 04/06/15 0713   04/05/15 1615  piperacillin-tazobactam (ZOSYN) IVPB 3.375 g     3.375 g 100 mL/hr over 30 Minutes Intravenous  Once 04/05/15 1614 04/05/15 1826   04/05/15 1615  vancomycin (VANCOCIN) IVPB 1000 mg/200 mL premix     1,000 mg 200 mL/hr over 60 Minutes Intravenous  Once 04/05/15 1614 04/05/15 1808      Assessment: 59 yo male diagnosed with sepsis. Pt has a history of renal transplant on tacrolimus and is noncommunicative intracranial bleeding. Pt also has a ulcer/mass on penis with weeping and odor. Recent admission for sepsis/UTI/PNA.  10/2 Zosyn x 1 10/2 >> Ceftazidime >> 10/2 >>  Vancomycin >>  Afebrile WBC WNL SCr 2.25, CrCl 35 ml/min/1.1m2 (normalized)  10/2 blood x 2: sent 10/2 urine: sent  Goal of Therapy:  Vancomycin trough level 15-20 mcg/ml  Ceftazidime dose per renal function  Plan:   Increase ceftazidime to 1g IV q12h  Increase vancomycin to 1250mg  IV q24h Check trough at steady state Follow up renal function & cultures, clinical course  Peggyann Juba, PharmD, BCPS Pager: [phone] 04/06/2015,7:14 AM

## 2015-04-06 NOTE — Progress Notes (Signed)
[facility] PROGRESS NOTE  Justin [name] VOH:[id number] DOB: 01-Apr-1956 DOA: 04/05/2015 PCP: Garwin Brothers, MD Interim summary: Justin [name] is a 59 y.o. male   has a past medical history of Hypertension; Dyslipidemia; Carpal tunnel syndrome; A-V fistula, (diabetes mellitus, intra cranial hemorrhage, CKD, s/p kidney transplant. Discharged to SNF presented with increased lethargy and HCAP.  Assessment/Plan: 1. Acute encephalopathy probably from sepsis from acute respiratory failure /  Health care associated pneumonia requiring bipap.  He was admitted to step down and started on broad spectrum antibiotics . bipap asneeded and transition to [place] oxygen when appropriate. Sputum and blood cultures ordered.  PCCM consulted last night and recommendations given.    2. H/o kidney transplant, and  Acute on chronic stage 3 CKD, ; - monitor and renal consulted, for recommendations.    Diabetes Mellitus: CBG (last 3)   Recent Labs  04/06/15 0313 04/06/15 0740 04/06/15 1227  GLUCAP 331* 300* 363*    Resume SSI, increase lantus to 10 units .   Penile ulcer: Outpatient follow up with dermatology with a biopsy.    Hyperkalemia: Resolved.    Hypotension; resolved.    UTI: cultures showing E COli,   Code Status: presumed full code.  Family Communication: tried calling her daughter , could not leave a message. Disposition Plan: pending further investigation.   Plan:  Called palliative care consult for goals of care.     Consultants:  PCCM  RENAL.   Palliative care.   Procedures: bipap PRN Antibiotics:  fortaz 10/2  Vancomycin 10/2  HPI/Subjective: Did not verbalize, sleeping on BIPAP.   Objective: Filed Vitals:   04/06/15 1800  BP: 153/33  Pulse: 60  Temp: 96.6 F (35.9 C)  Resp: 13    Intake/Output Summary (Last 24 hours) at 04/06/15 1819 Last data filed at 04/06/15 1400  Gross per 24 hour  Intake    550 ml  Output    951 ml  Net   -401 ml    Filed Weights   04/05/15 1712  Weight: 81.874 kg (180 lb 8 oz)    Exam:   General:  Arousable, appears comfortable,   Cardiovascular: s1s2  Respiratory: diminished at bases, no wheezing or rhonchi,   Abdomen: soft NT , no distention,   Musculoskeletal: no pedal edema.   Data Reviewed: Basic Metabolic Panel:  Recent Labs Lab 04/05/15 1650 04/05/15 2244 04/06/15 0430  NA 145 146* 147*  K 5.7* 5.1 4.7  CL 108 110 111  CO2 34* 28 31  GLUCOSE 326* 322* 329*  BUN 73* 71* 66*  CREATININE 2.61* 2.28* 2.25*  CALCIUM 10.4* 10.0 9.8  MG  --   --  2.4  PHOS  --   --  2.7   Liver Function Tests:  Recent Labs Lab 04/05/15 1650 04/06/15 0430  AST 55* 39  ALT 42 37  ALKPHOS 71 75  BILITOT 0.9 0.4  PROT 8.1 7.3  ALBUMIN 2.8* 2.6*   No results for input(s): LIPASE, AMYLASE in the last 168 hours. No results for input(s): AMMONIA in the last 168 hours. CBC:  Recent Labs Lab 04/05/15 1650 04/06/15 0043 04/06/15 0430  WBC 9.0 9.7 8.3  NEUTROABS 7.0  --   --   HGB 14.6 13.0 13.0  HCT 49.9 45.2 45.0  MCV 95.2 96.0 93.9  PLT 174 162 151   Cardiac Enzymes:  Recent Labs Lab 04/05/15 2244 04/06/15 0430 04/06/15 1110  TROPONINI 0.23* 0.15* 0.09*   BNP (last 3 results)  No results for input(s): BNP in the last 8760 hours.  ProBNP (last 3 results) No results for input(s): PROBNP in the last 8760 hours.  CBG:  Recent Labs Lab 04/05/15 2235 04/06/15 0313 04/06/15 0740 04/06/15 1227  GLUCAP 295* 331* 300* 363*    Recent Results (from the past 240 hour(s))  Blood Culture (routine x 2)     Status: None (Preliminary result)   Collection Time: 04/05/15  4:45 PM  Result Value Ref Range Status   Specimen Description BLOOD RIGHT AC  5 ML IN [facility] BOTTLE  Final   Special Requests NONE  Final   Culture   Final    NO GROWTH < 24 HOURS Performed at [facility]    Report Status PENDING  Incomplete  Blood Culture (routine x 2)     Status: None  (Preliminary result)   Collection Time: 04/05/15  4:50 PM  Result Value Ref Range Status   Specimen Description BLOOD RIGHT HAND  5 ML IN [facility] BOTTLE  Final   Special Requests NONE  Final   Culture   Final    NO GROWTH < 24 HOURS Performed at [facility]    Report Status PENDING  Incomplete  Urine culture     Status: None (Preliminary result)   Collection Time: 04/05/15  5:00 PM  Result Value Ref Range Status   Specimen Description URINE, CATHETERIZED  Final   Special Requests NONE  Final   Culture   Final    >=100,000 COLONIES/mL ESCHERICHIA COLI Performed at [facility]    Report Status PENDING  Incomplete  MRSA PCR Screening     Status: None   Collection Time: 04/05/15  9:44 PM  Result Value Ref Range Status   MRSA by PCR NEGATIVE NEGATIVE Final    Comment:        The GeneXpert MRSA Assay (FDA approved for NASAL specimens only), is one component of a comprehensive MRSA colonization surveillance program. It is not intended to diagnose MRSA infection nor to guide or monitor treatment for MRSA infections.      Studies: Ct Head Wo Contrast  04/05/2015   CLINICAL DATA:  Altered mental status. Patient reported to be altered from baseline. Patient nonverbal. No response of symptoms to or chronic reversal.  EXAM: CT HEAD WITHOUT CONTRAST  TECHNIQUE: Contiguous axial images were obtained from the base of the skull through the vertex without intravenous contrast.  COMPARISON:  03/01/2015.  FINDINGS: Generalized atrophy. Chronic microvascular ischemic change. 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 Compensatory enlargement LEFT lateral ventricle.  No acute stroke or acute hemorrhage. No extra-axial fluid. No mass lesion.  There is evidence for a LEFT frontal burr hole, otherwise the calvarium is intact. There is no acute sinus or mastoid disease. No  change from priors.  IMPRESSION: Remote LEFT hemisphere ischemic event is stable. No acute intracranial findings. No posttraumatic sequelae are evident.   Electronically Signed   By: Staci Righter M.D.   On: 04/05/2015 17:53   Ct Pelvis Wo Contrast  04/06/2015   CLINICAL DATA:  Altered mental status. Fever. Large ulcer/mass at the base of the penis with foul odor. Renal transplant 9 years prior. Large B-cell lymphoma. Recent pneumonia and urinary tract infection.  EXAM: CT PELVIS WITHOUT CONTRAST  TECHNIQUE: Multidetector CT imaging of the pelvis was performed following the standard protocol without intravenous contrast.  COMPARISON:  03/03/2015 CT abdomen.  04/15/2010 CT abdomen/pelvis.  FINDINGS: Reproductive: Normal size prostate.  Bladder: Relatively collapsed urinary bladder. Foley catheter is in place within the urinary bladder. Gas within the nondependent bladder lumen is likely due to instrumentation. No definite bladder wall thickening. There is mild fullness of the right lower quadrant renal transplant collecting system without overt right hydronephrosis, stable since 03/03/2015. Tiny focus of gas within the medial transplant collecting system is nonspecific and likely due to the Foley catheter in the bladder. There is mild urothelial thickening throughout the transplant renal collecting system, nonspecific, stable since 03/03/2015. No peritransplant fluid collection.  Bowel: Moderate stool in the rectum. Oral contrast is seen extending to the distal rectum. No dilated small bowel loops in the pelvis. No appreciable bowel wall thickening.  Vascular/Lymphatic: No pathologically enlarged lymph nodes in the pelvis. Atherosclerosis is seen throughout the pelvic arteries.  Other: No pneumoperitoneum, ascites or focal fluid collection. There is nonspecific irregular skin thickening lateral and posterior to the proximal penile shaft (series 2/images 45-51) extending into the anterior superior scrotal skin.  There is no associated gas or fluid collection. No gas, significant hydrocele, mass or fluid collection is seen in the scrotum.  Musculoskeletal: No aggressive appearing focal osseous lesions. Calcification of the posterior annulus at L4-5 and L5-S1, likely representing chronic annular tears.  IMPRESSION: 1. Nonspecific irregular skin thickening lateral and posterior to the proximal penile shaft extending into the anterior superior scrotal skin, without associated gas or fluid collection. Given the history renal transplant, consider post transplant lymphoproliferative disorder (PTLD). Directed skin biopsy may be warranted. 2. Nonspecific gas in the bladder lumen and right renal transplant collecting system, likely due to instrumentation from the Foley catheter, cannot exclude gas producing urinary tract infection. Correlate with urinalysis. 3. Stable nonspecific urothelial thickening in the right renal transplant collecting system, which could indicate infection or rejection.   Electronically Signed   By: Ilona Sorrel M.D.   On: 04/06/2015 10:41   US Renal  04/06/2015   CLINICAL DATA:  Acute kidney injury superimposed on chronic kidney disease, history renal transplant  EXAM: RENAL / URINARY TRACT ULTRASOUND COMPLETE  COMPARISON:  04/06/2015, 02/27/2015  FINDINGS: Right Kidney:  Length: 8.7 cm. Marked increased echogenicity and diffuse atrophy. No hydronephrosis.  Left Kidney:  Length: 9.3 cm. Diffuse increased echogenicity and atrophy, not as severe as the right kidney. Echogenic shadowing lower pole calculus measures 8 mm. No hydronephrosis.  Right lower quadrant transplant kidney: Normal echogenicity and cortical thickness. Minor pelviectasis. No hydronephrosis. Transplant kidney measures 10.3 cm.  Bladder:  Not visualized.  Decompressed by Foley catheter.  Additional findings include numerous gallstones within the gallbladder.  IMPRESSION: Marked atrophy of the native kidneys without obstruction or  hydronephrosis  Mild pelviectasis of the transplant kidney  Cholelithiasis   Electronically Signed   By: Jerilynn Mages.  Shick M.D.   On: 04/06/2015 12:45   Dg Chest Port 1 View  04/05/2015   CLINICAL DATA:  Altered mental status, hypotension, diabetes  EXAM: PORTABLE CHEST 1 VIEW  COMPARISON:  03/01/2015  FINDINGS: Cardiac size exaggerated by kyphotic positioning and low inspiratory effect. Allowing for this is likely upper normal. Vascular pattern appears without pulmonary venous hypertension. There are increased interstitial markings as well as perihilar infiltrates bilaterally. These are new from the prior study.  IMPRESSION: Appearance suggests pulmonary edema is a possibility. Bilateral lower lobe pneumonia could also be considered.   Electronically Signed   By: Elodia Florence.D.  On: 04/05/2015 16:48    Scheduled Meds: . antiseptic oral rinse  7 mL Mouth Rinse q12n4p  . aspirin  81 mg Oral Daily  . cefTAZidime (FORTAZ)  IV  1 g Intravenous Q12H  . chlorhexidine  15 mL Mouth Rinse BID  . enoxaparin (LOVENOX) injection  40 mg Subcutaneous QHS  . hydrocortisone sod succinate (SOLU-CORTEF) inj  100 mg Intravenous 4 times per day  . [START ON 04/07/2015] Influenza vac split quadrivalent PF  0.5 mL Intramuscular Tomorrow-1000  . insulin aspart  0-9 Units Subcutaneous 6 times per day  . insulin glargine  5 Units Subcutaneous QHS  . lamoTRIgine  50 mg Per Tube Daily  . predniSONE  5 mg Per Tube Daily  . sodium chloride  3 mL Intravenous Q12H  . sucralfate  1 g Per Tube 3 times per day  . tacrolimus  0.5 mg Oral BID  . tacrolimus  3 mg Oral BID  . vancomycin  1,250 mg Intravenous Q24H   Continuous Infusions:   Active Problems:   Diabetes mellitus ([place])   Essential hypertension   UTI (lower urinary tract infection)   Sepsis ([place])   Acute renal failure superimposed on stage 3 chronic kidney disease (HCC)   Pneumonia   S/p cadaver renal transplant   Acute respiratory failure with hypoxia and  hypercarbia (HCC)   Chronic systolic CHF (congestive heart failure) (HCC)   ESRD (end stage renal disease) ([place])   Penile ulcer    Time spent: 25 minutes.     [facility] Pager [phone]. If 7PM-7AM, please contact night-coverage at [email or web address], password [facility] 04/06/2015, 6:19 PM  LOS: 1 day

## 2015-04-06 NOTE — Progress Notes (Signed)
CRITICAL VALUE ALERT  Critical value received:  Lactic acid 2.1  Date of notification:  04/05/2015  Time of notification:  2326  Critical value read back:Yes.    Nurse who received alert:  mk  MD notified (1st page):  callahan  Time of first page:  2330  MD notified (2nd page):  Time of second page:  Responding MD:  callahan  Time MD responded:  2335

## 2015-04-06 NOTE — Progress Notes (Signed)
  Echocardiogram 2D Echocardiogram with Definity has been performed.  Justin [name] 04/06/2015, 2:04 PM

## 2015-04-06 NOTE — Progress Notes (Signed)
Patient transported on BiPAP to CT. No complications noted during transport to and from CT. Patient returned to room and is resting comfortably. RN at bedside. RT will continue to monitor.

## 2015-04-07 ENCOUNTER — Inpatient Hospital Stay (HOSPITAL_COMMUNITY): Payer: Medicare Other

## 2015-04-07 DIAGNOSIS — N179 Acute kidney failure, unspecified: Secondary | ICD-10-CM | POA: Insufficient documentation

## 2015-04-07 DIAGNOSIS — N189 Chronic kidney disease, unspecified: Secondary | ICD-10-CM | POA: Insufficient documentation

## 2015-04-07 LAB — COMPREHENSIVE METABOLIC PANEL
ALBUMIN: 2.7 g/dL — AB (ref 3.5–5.0)
ALK PHOS: 76 U/L (ref 38–126)
ALT: 27 U/L (ref 17–63)
AST: 21 U/L (ref 15–41)
Anion gap: 7 (ref 5–15)
BILIRUBIN TOTAL: 0.8 mg/dL (ref 0.3–1.2)
BUN: 63 mg/dL — AB (ref 6–20)
CALCIUM: 10.6 mg/dL — AB (ref 8.9–10.3)
CO2: 30 mmol/L (ref 22–32)
CREATININE: 2.05 mg/dL — AB (ref 0.61–1.24)
Chloride: 115 mmol/L — ABNORMAL HIGH (ref 101–111)
GFR calc Af Amer: 39 mL/min — ABNORMAL LOW (ref >60)
GFR calc non Af Amer: 33 mL/min — ABNORMAL LOW (ref >60)
GLUCOSE: 221 mg/dL — AB (ref 65–99)
Potassium: 4.6 mmol/L (ref 3.5–5.1)
Sodium: 152 mmol/L — ABNORMAL HIGH (ref 135–145)
Total Protein: 8 g/dL (ref 6.5–8.1)

## 2015-04-07 LAB — GLUCOSE, CAPILLARY
GLUCOSE-CAPILLARY: 233 mg/dL — AB (ref 65–99)
GLUCOSE-CAPILLARY: 213 mg/dL — AB (ref 65–99)
Glucose-Capillary: 235 mg/dL — ABNORMAL HIGH (ref 65–99)
Glucose-Capillary: 218 mg/dL — ABNORMAL HIGH (ref 65–99)
Glucose-Capillary: 249 mg/dL — ABNORMAL HIGH (ref 65–99)
Glucose-Capillary: 222 mg/dL — ABNORMAL HIGH (ref 65–99)
Glucose-Capillary: 145 mg/dL — ABNORMAL HIGH (ref 65–99)
Glucose-Capillary: 152 mg/dL — ABNORMAL HIGH (ref 65–99)

## 2015-04-07 LAB — HEMOGLOBIN A1C
Hgb A1c MFr Bld: 9 % — ABNORMAL HIGH (ref 4.8–5.6)
MEAN PLASMA GLUCOSE: 212 mg/dL

## 2015-04-07 LAB — CBC
HCT: 49.8 % (ref 39.0–52.0)
HEMOGLOBIN: 14.8 g/dL (ref 13.0–17.0)
MCH: 27.5 pg (ref 26.0–34.0)
MCHC: 29.7 g/dL — AB (ref 30.0–36.0)
MCV: 92.4 fL (ref 78.0–100.0)
Platelets: 174 10*3/uL (ref 150–400)
RBC: 5.39 MIL/uL (ref 4.22–5.81)
RDW: 15 % (ref 11.5–15.5)
WBC: 7.6 10*3/uL (ref 4.0–10.5)

## 2015-04-07 LAB — URINE CULTURE: Culture: >=100000

## 2015-04-07 LAB — LAMOTRIGINE LEVEL: LAMOTRIGINE LVL: 1.5 ug/mL — AB (ref 2.0–20.0)

## 2015-04-07 MED ORDER — HYDRALAZINE HCL 20 MG/ML IJ SOLN
5.0000 mg | INTRAMUSCULAR | Status: DC | PRN
  Administered 2015-04-07 – 2015-04-10 (×4): 5 mg via INTRAVENOUS
  Filled 2015-04-07 (×4): qty 1

## 2015-04-07 MED ORDER — LORAZEPAM 2 MG/ML IJ SOLN
1.0000 mg | Freq: Four times a day (QID) | INTRAMUSCULAR | Status: DC | PRN
  Administered 2015-04-07 – 2015-04-08 (×2): 1 mg via INTRAVENOUS
  Filled 2015-04-07 (×2): qty 1

## 2015-04-07 MED ORDER — DEXTROSE 5 % IV SOLN
INTRAVENOUS | Status: DC
  Administered 2015-04-07: 12:00:00 via INTRAVENOUS

## 2015-04-07 MED ORDER — FENTANYL 25 MCG/HR TD PT72
25.0000 ug | MEDICATED_PATCH | TRANSDERMAL | Status: DC
  Administered 2015-04-07: 25 ug via TRANSDERMAL
  Filled 2015-04-07: qty 1

## 2015-04-07 MED ORDER — HYDRALAZINE HCL 25 MG PO TABS
25.0000 mg | ORAL_TABLET | Freq: Two times a day (BID) | ORAL | Status: DC
  Administered 2015-04-07 – 2015-04-08 (×3): 25 mg via ORAL
  Filled 2015-04-07 (×3): qty 1

## 2015-04-07 MED ORDER — GABAPENTIN 300 MG PO CAPS
300.0000 mg | ORAL_CAPSULE | Freq: Three times a day (TID) | ORAL | Status: DC
  Administered 2015-04-07 – 2015-04-10 (×9): 300 mg via ORAL
  Filled 2015-04-07 (×9): qty 1

## 2015-04-07 MED ORDER — HYDROMORPHONE HCL 1 MG/ML IJ SOLN
0.5000 mg | INTRAMUSCULAR | Status: DC | PRN
  Administered 2015-04-07 (×2): 0.5 mg via INTRAVENOUS
  Filled 2015-04-07 (×2): qty 1

## 2015-04-07 NOTE — Progress Notes (Signed)
Called to room for patient moving around in the bed and has broken the BiPAP mask. Instructed RN to place on NRB mask until I arrive with a replacement mask. Upon arrival, patient alert and attempting to communicate. Placed on 4L O2 via nasal prong. SpO2 remains 100%. MD notified. Order received for BiPAP prn.

## 2015-04-07 NOTE — Progress Notes (Signed)
[facility] PROGRESS NOTE  JAMIAN ANDUJO JKK:[id number] DOB: 11-30-1955 DOA: 04/05/2015 PCP: Garwin Brothers, MD Interim summary: Justin [name] is a 59 y.o. male   has a past medical history of Hypertension; Dyslipidemia; Carpal tunnel syndrome; A-V fistula, (diabetes mellitus, intra cranial hemorrhage, CKD, s/p kidney transplant. Discharged to SNF presented with increased lethargy and HCAP.  Assessment/Plan: 1. Acute encephalopathy probably from sepsis from acute respiratory failure /  Health care associated pneumonia requiring bipap.  He was admitted to step down and started on broad spectrum antibiotics . We have  Transitioned him  to [place] oxygen to 3 lit [place] today. Sputum and blood cultures ordered.  PCCM consulted  and recommendations given.  His mental status improved, he is awake and trying to communicate.    2. H/o kidney transplant, and  Acute on chronic stage 3 CKD, ; - improved, probably from fluids.  - monitor and renal consulted, for recommendations.  - plan for outpatient follow up - obtain tacrolimus level as outpatient.    Diabetes Mellitus: CBG (last 3)   Recent Labs  04/07/15 0749 04/07/15 1150 04/07/15 1612  GLUCAP 222* 213* 145*    Resume SSI, increase lantus to 10 units .  Stop the cortef.   Penile ulcer: Outpatient follow up with dermatology with a biopsy.    Hyperkalemia: Resolved.    Hypotension; resolved.    UTI: cultures showing E COli, resume IV antibiotics.   Code Status: presumed full code.  Family Communication: tried calling her daughter , could not leave a message. Disposition Plan:  Transfer to telemetry.   Plan:  Called palliative care consult for goals of care.   Nutrition: He does not have dentures, reluctant to start him on diet, called SLP eval and nutrition to restart tube feeds if he Is getting tube feeds at SNF.   Consultants:  PCCM  RENAL.   Palliative care.   Procedures: bipap PRN Antibiotics:  fortaz  10/2  Vancomycin 10/2  HPI/Subjective: Comfortable. NO NEW COMPLAINTS. Suspect HE IS NON VERBAL at baseline.   Objective: Filed Vitals:   04/07/15 1900  BP:   Pulse: 65  Temp: 96.6 F (35.9 C)  Resp: 22    Intake/Output Summary (Last 24 hours) at 04/07/15 1921 Last data filed at 04/07/15 1900  Gross per 24 hour  Intake 889.17 ml  Output   1050 ml  Net -160.83 ml   Filed Weights   04/05/15 1712  Weight: 81.874 kg (180 lb 8 oz)    Exam:   General:  Arousable, appears comfortable,   Cardiovascular: s1s2  Respiratory: diminished at bases, no wheezing or rhonchi,   Abdomen: soft NT , no distention,   Musculoskeletal: no pedal edema.   Data Reviewed: Basic Metabolic Panel:  Recent Labs Lab 04/05/15 1650 04/05/15 2244 04/06/15 0430 04/07/15 1020  NA 145 146* 147* 152*  K 5.7* 5.1 4.7 4.6  CL 108 110 111 115*  CO2 34* 28 31 30   GLUCOSE 326* 322* 329* 221*  BUN 73* 71* 66* 63*  CREATININE 2.61* 2.28* 2.25* 2.05*  CALCIUM 10.4* 10.0 9.8 10.6*  MG  --   --  2.4  --   PHOS  --   --  2.7  --    Liver Function Tests:  Recent Labs Lab 04/05/15 1650 04/06/15 0430 04/07/15 1020  AST 55* 39 21  ALT 42 37 27  ALKPHOS 71 75 76  BILITOT 0.9 0.4 0.8  PROT 8.1 7.3 8.0  ALBUMIN 2.8*  2.6* 2.7*   No results for input(s): LIPASE, AMYLASE in the last 168 hours. No results for input(s): AMMONIA in the last 168 hours. CBC:  Recent Labs Lab 04/05/15 1650 04/06/15 0043 04/06/15 0430 04/07/15 0830  WBC 9.0 9.7 8.3 7.6  NEUTROABS 7.0  --   --   --   HGB 14.6 13.0 13.0 14.8  HCT 49.9 45.2 45.0 49.8  MCV 95.2 96.0 93.9 92.4  PLT 174 162 151 174   Cardiac Enzymes:  Recent Labs Lab 04/05/15 2244 04/06/15 0430 04/06/15 1110  TROPONINI 0.23* 0.15* 0.09*   BNP (last 3 results) No results for input(s): BNP in the last 8760 hours.  ProBNP (last 3 results) No results for input(s): PROBNP in the last 8760 hours.  CBG:  Recent Labs Lab 04/07/15 0121  04/07/15 0416 04/07/15 0749 04/07/15 1150 04/07/15 1612  GLUCAP 218* 249* 222* 213* 145*    Recent Results (from the past 240 hour(s))  Blood Culture (routine x 2)     Status: None (Preliminary result)   Collection Time: 04/05/15  4:45 PM  Result Value Ref Range Status   Specimen Description BLOOD RIGHT AC  5 ML IN [facility] BOTTLE  Final   Special Requests NONE  Final   Culture   Final    NO GROWTH 2 DAYS Performed at [facility]    Report Status PENDING  Incomplete  Blood Culture (routine x 2)     Status: None (Preliminary result)   Collection Time: 04/05/15  4:50 PM  Result Value Ref Range Status   Specimen Description BLOOD RIGHT HAND  5 ML IN [facility] BOTTLE  Final   Special Requests NONE  Final   Culture   Final    NO GROWTH 2 DAYS Performed at [facility]    Report Status PENDING  Incomplete  Urine culture     Status: None   Collection Time: 04/05/15  5:00 PM  Result Value Ref Range Status   Specimen Description URINE, CATHETERIZED  Final   Special Requests NONE  Final   Culture   Final    >=100,000 COLONIES/mL ESCHERICHIA COLI Performed at [facility]    Report Status 04/07/2015 FINAL  Final   Organism ID, Bacteria ESCHERICHIA COLI  Final      Susceptibility   Escherichia coli - MIC*    AMPICILLIN >=32 RESISTANT Resistant     CEFAZOLIN <=4 SENSITIVE Sensitive     CEFTRIAXONE <=1 SENSITIVE Sensitive     CIPROFLOXACIN >=4 RESISTANT Resistant     GENTAMICIN >=16 RESISTANT Resistant     IMIPENEM <=0.25 SENSITIVE Sensitive     NITROFURANTOIN <=16 SENSITIVE Sensitive     TRIMETH/SULFA <=20 SENSITIVE Sensitive     AMPICILLIN/SULBACTAM 16 INTERMEDIATE Intermediate     PIP/TAZO <=4 SENSITIVE Sensitive     * >=100,000 COLONIES/mL ESCHERICHIA COLI  MRSA PCR Screening     Status: None   Collection Time: 04/05/15  9:44 PM  Result Value Ref Range Status   MRSA by PCR NEGATIVE NEGATIVE Final    Comment:        The GeneXpert MRSA Assay (FDA approved  for NASAL specimens only), is one component of a comprehensive MRSA colonization surveillance program. It is not intended to diagnose MRSA infection nor to guide or monitor treatment for MRSA infections.      Studies: Ct Pelvis Wo Contrast  04/06/2015   CLINICAL DATA:  Altered mental status. Fever. Large ulcer/mass at the base of the penis  with foul odor. Renal transplant 9 years prior. Large B-cell lymphoma. Recent pneumonia and urinary tract infection.  EXAM: CT PELVIS WITHOUT CONTRAST  TECHNIQUE: Multidetector CT imaging of the pelvis was performed following the standard protocol without intravenous contrast.  COMPARISON:  03/03/2015 CT abdomen.  04/15/2010 CT abdomen/pelvis.  FINDINGS: Reproductive: Normal size prostate.  Bladder: Relatively collapsed urinary bladder. Foley catheter is in place within the urinary bladder. Gas within the nondependent bladder lumen is likely due to instrumentation. No definite bladder wall thickening. There is mild fullness of the right lower quadrant renal transplant collecting system without overt right hydronephrosis, stable since 03/03/2015. Tiny focus of gas within the medial transplant collecting system is nonspecific and likely due to the Foley catheter in the bladder. There is mild urothelial thickening throughout the transplant renal collecting system, nonspecific, stable since 03/03/2015. No peritransplant fluid collection.  Bowel: Moderate stool in the rectum. Oral contrast is seen extending to the distal rectum. No dilated small bowel loops in the pelvis. No appreciable bowel wall thickening.  Vascular/Lymphatic: No pathologically enlarged lymph nodes in the pelvis. Atherosclerosis is seen throughout the pelvic arteries.  Other: No pneumoperitoneum, ascites or focal fluid collection. There is nonspecific irregular skin thickening lateral and posterior to the proximal penile shaft (series 2/images 45-51) extending into the anterior superior scrotal skin.  There is no associated gas or fluid collection. No gas, significant hydrocele, mass or fluid collection is seen in the scrotum.  Musculoskeletal: No aggressive appearing focal osseous lesions. Calcification of the posterior annulus at L4-5 and L5-S1, likely representing chronic annular tears.  IMPRESSION: 1. Nonspecific irregular skin thickening lateral and posterior to the proximal penile shaft extending into the anterior superior scrotal skin, without associated gas or fluid collection. Given the history renal transplant, consider post transplant lymphoproliferative disorder (PTLD). Directed skin biopsy may be warranted. 2. Nonspecific gas in the bladder lumen and right renal transplant collecting system, likely due to instrumentation from the Foley catheter, cannot exclude gas producing urinary tract infection. Correlate with urinalysis. 3. Stable nonspecific urothelial thickening in the right renal transplant collecting system, which could indicate infection or rejection.   Electronically Signed   By: Ilona Sorrel M.D.   On: 04/06/2015 10:41   US Renal  04/06/2015   CLINICAL DATA:  Acute kidney injury superimposed on chronic kidney disease, history renal transplant  EXAM: RENAL / URINARY TRACT ULTRASOUND COMPLETE  COMPARISON:  04/06/2015, 02/27/2015  FINDINGS: Right Kidney:  Length: 8.7 cm. Marked increased echogenicity and diffuse atrophy. No hydronephrosis.  Left Kidney:  Length: 9.3 cm. Diffuse increased echogenicity and atrophy, not as severe as the right kidney. Echogenic shadowing lower pole calculus measures 8 mm. No hydronephrosis.  Right lower quadrant transplant kidney: Normal echogenicity and cortical thickness. Minor pelviectasis. No hydronephrosis. Transplant kidney measures 10.3 cm.  Bladder:  Not visualized.  Decompressed by Foley catheter.  Additional findings include numerous gallstones within the gallbladder.  IMPRESSION: Marked atrophy of the native kidneys without obstruction or  hydronephrosis  Mild pelviectasis of the transplant kidney  Cholelithiasis   Electronically Signed   By: Jerilynn Mages.  Shick M.D.   On: 04/06/2015 12:45   Dg Chest Port 1 View  04/07/2015   CLINICAL DATA:  Followup exam for pneumonia versus pulmonary edema.  EXAM: PORTABLE CHEST 1 VIEW  COMPARISON:  04/05/2015  FINDINGS: Irregular interstitial densities and bilateral perihilar and lower lung zone airspace opacities have improved since the prior study. There are no new lung opacities. No convincing pleural effusion or  pneumothorax. Cardiac silhouette is normal in size and configuration. Normal mediastinal and hilar contours.  IMPRESSION: Improved lung aeration most consistent with improved pulmonary edema. No convincing pneumonia.   Electronically Signed   By: Lajean Manes M.D.   On: 04/07/2015 08:55    Scheduled Meds: . antiseptic oral rinse  7 mL Mouth Rinse q12n4p  . aspirin  81 mg Oral Daily  . cefTAZidime (FORTAZ)  IV  1 g Intravenous Q12H  . chlorhexidine  15 mL Mouth Rinse BID  . enoxaparin (LOVENOX) injection  40 mg Subcutaneous QHS  . fentaNYL  25 mcg Transdermal Q72H  . gabapentin  300 mg Oral TID  . hydrALAZINE  25 mg Oral Q12H  . Influenza vac split quadrivalent PF  0.5 mL Intramuscular Tomorrow-1000  . insulin aspart  0-15 Units Subcutaneous Q4H  . insulin glargine  10 Units Subcutaneous QHS  . lamoTRIgine  50 mg Per Tube Daily  . predniSONE  5 mg Per Tube Daily  . sodium chloride  3 mL Intravenous Q12H  . sucralfate  1 g Per Tube 3 times per day  . tacrolimus  0.5 mg Oral BID  . tacrolimus  3 mg Oral BID  . vancomycin  1,250 mg Intravenous Q24H   Continuous Infusions: . dextrose 50 mL/hr at 04/07/15 1213    Active Problems:   Diabetes mellitus ([place])   Essential hypertension   UTI (lower urinary tract infection)   Sepsis ([place])   Acute renal failure superimposed on stage 3 chronic kidney disease (HCC)   Pneumonia   S/p cadaver renal transplant   Acute respiratory failure with  hypoxia and hypercarbia (HCC)   Chronic systolic CHF (congestive heart failure) ([place])   ESRD (end stage renal disease) ([place])   Penile ulcer    Time spent: 25 minutes.     [facility] Pager [phone]. If 7PM-7AM, please contact night-coverage at [email or web address], password [facility] 04/07/2015, 7:21 PM  LOS: 2 days

## 2015-04-07 NOTE — Progress Notes (Signed)
[facility] Progress Note   Subjective: babbling in bed, unintelligible  Filed Vitals:   04/07/15 1000 04/07/15 1200 04/07/15 1400 04/07/15 1500  BP: 166/80 170/75 128/68   Pulse: 70 73 75   Temp: 96.8 F (36 C) 97 F (36.1 C) 97 F (36.1 C)   TempSrc:  Core (Comment)    Resp: 14 16 27    Height:      Weight:      SpO2: 99% 100% 95% 98%   Exam: Awake, babbling incoherent syllables  Chest clear ant and lat bilat RRR no MRG noted Abd soft ntnd +BS PEG tube in place Penile fungating sessile skin changes surrounding penis, foley in place No LE edema No movement of bilat LE's or RUE. LUE 4/5 strength Neuro responsive, minimally verbal  UA tntc wbc's, 3-6 rbc, 100 prot CT pelvis on 10/3 > skin thickening lat and post to penile shaft, possible PTLD. Stable urothelial thickening in R renal Tx collecting system.  CXR bilat IS infiltrates poss ^volume  Home meds > asa, lipitor, celexa, duragesic patch, neurontin, hydralazine, lantus, Humalog, Isordil, Lamictal, ativan, prilosec, zofran, Phos-NAK, prednisone, carafate, prograf, zanaflex, allopurinol, vit D , Keppra, tube feeds  Baseline crea 1.5- 1.8 April/ Aug this year   Assessment: 1 Acute on CKD 3 of renal transplant - improved, good UOP. Creat down at 2.0 today. Not far from baseline. F/B [facility] for his transplant.  Ordered prograf level at Rx request.  Euvolemic. No further suggestions, will sign off. He should have f/u appt arranged with his nephrologist at [facility] after dc.  2 Dyspnea - improved 3 Hx of fall/ SDH w residual R hemiparesis 4 Feeding G-tube 5 Seizure d/o 6 HTN  7 Chronic pain 8 DM 9 Hx lymphoma rx with whole-brain irradiation 10 Full code 11 Volume - no vol excess on exam  Plan - as above    Kelly Splinter MD  pager [phone]    cell [phone]  04/07/2015, 3:58 PM     Recent Labs Lab 04/05/15 2244 04/06/15 0430 04/07/15 1020  NA 146* 147* 152*  K 5.1 4.7 4.6  CL 110 111 115*   CO2 28 31 30   GLUCOSE 322* 329* 221*  BUN 71* 66* 63*  CREATININE 2.28* 2.25* 2.05*  CALCIUM 10.0 9.8 10.6*  PHOS  --  2.7  --     Recent Labs Lab 04/05/15 1650 04/06/15 0430 04/07/15 1020  AST 55* 39 21  ALT 42 37 27  ALKPHOS 71 75 76  BILITOT 0.9 0.4 0.8  PROT 8.1 7.3 8.0  ALBUMIN 2.8* 2.6* 2.7*    Recent Labs Lab 04/05/15 1650 04/06/15 0043 04/06/15 0430 04/07/15 0830  WBC 9.0 9.7 8.3 7.6  NEUTROABS 7.0  --   --   --   HGB 14.6 13.0 13.0 14.8  HCT 49.9 45.2 45.0 49.8  MCV 95.2 96.0 93.9 92.4  PLT 174 162 151 174   . antiseptic oral rinse  7 mL Mouth Rinse q12n4p  . aspirin  81 mg Oral Daily  . cefTAZidime (FORTAZ)  IV  1 g Intravenous Q12H  . chlorhexidine  15 mL Mouth Rinse BID  . enoxaparin (LOVENOX) injection  40 mg Subcutaneous QHS  . fentaNYL  25 mcg Transdermal Q72H  . gabapentin  300 mg Oral TID  . hydrALAZINE  25 mg Oral Q12H  . Influenza vac split quadrivalent PF  0.5 mL Intramuscular Tomorrow-1000  . insulin aspart  0-15 Units Subcutaneous Q4H  . insulin glargine  10 Units Subcutaneous QHS  . lamoTRIgine  50 mg Per Tube Daily  . predniSONE  5 mg Per Tube Daily  . sodium chloride  3 mL Intravenous Q12H  . sucralfate  1 g Per Tube 3 times per day  . tacrolimus  0.5 mg Oral BID  . tacrolimus  3 mg Oral BID  . vancomycin  1,250 mg Intravenous Q24H   . dextrose 50 mL/hr at 04/07/15 1213   hydrALAZINE, HYDROmorphone (DILAUDID) injection, LORazepam, naLOXone (NARCAN)  injection, ondansetron **OR** ondansetron (ZOFRAN) IV

## 2015-04-07 NOTE — Progress Notes (Signed)
Inpatient Diabetes Program Recommendations  AACE/ADA: New Consensus Statement on Inpatient Glycemic Control (2015)  Target Ranges:  Prepandial:   less than 140 mg/dL      Peak postprandial:   less than 180 mg/dL (1-2 hours)      Critically ill patients:  140 - 180 mg/dL   Review of Glycemic Control  Diabetes history: DM2 Outpatient Diabetes medications: Humalog 9 unit tidwc, Lantus 10 units QHS Current orders for Inpatient glycemic control: Lantus 10 units QHS, Novolog moderate Q4H  Inpatient Diabetes Program Recommendations:  Insulin - Basal: Increase Lantus to 14 units QHS HgbA1C: 9.0% - uncontrolled   Will continue to follow. Thank you. Lorenda Peck, RD, LDN, CDE Inpatient Diabetes Coordinator [phone]

## 2015-04-07 NOTE — Progress Notes (Signed)
PULMONARY / CRITICAL CARE MEDICINE   Name: Justin [name] MRN: [id number] DOB: 11/02/1955    ADMISSION DATE:  04/05/2015 CONSULTATION DATE:  04/06/2015   REFERRING MD :  Dr Karleen Hampshire  CHIEF COMPLAINT:  Respiratory  failure   SIGNIFICANT EVENTS:  - 04/05/2015 - > admit    HISTORY OF PRESENT ILLNESS:    Justin [name] 1955/09/06 59 y.o. admitted 04/05/2015 by the [facility] hospitalist team for encephalopathy and hypoxemic acute on chronic respiratory failure. Pulmonary critical care has been consulted due to the same. History is only available from the chart and talking to the bedside nurse. Patient due to encephalopathy is unable to provide a history. As best as I can gather he is a nursing home patient with multiple medical problems including status post renal transplant. Most recently was hospitalized 02/25/2015 through 03/06/2015 in our health system with sepsis secondary to Escherichia coli from the urine. He did have encephalopathy at that admission as well. He did have a PEG tube placed in September 1 for that admission. Of note according to the nurse was a question of penile ulcer at that time with recommendation to follow-up with dermatology or plastics.   Currently, in his nursing home he was found to be febrile and confused and lethargic and therefore was admitted to the intensive care unit. In the intensive care unit chest x-ray shows pulmonary infiltrates. His is also question of urinary tract infection. Broad-spectrum anabiotic's and BiPAP was started. His lethargy did not improve her Narcan at admission or prior to admission but seems to slowly improved since admission here.  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 He is noted to be on chronic prednisone and tacrolimus for the renal transplant but per chart does not follow up with nephrology. For this when he was  admitted at [facility] he sustained intracranial hemorrhage resulting in prominent right-sided weakness and dysphagia resulting in PEG tube and tracheostomy. All this was in 2009. Since then he's been in nursing home. At that point in time his lymphoma was treated with whole brain radiation  There is also history of DVT status post IVC filter in 2009.  He also has history of chronic systolic heart failure with ejection fraction 35% in 2014. No follow-up echo as noted in our computer system    VITAL SIGNS: Temp:  [95.4 F (35.2 C)-97.5 F (36.4 C)] 96.4 F (35.8 C) (10/04 0800) Pulse Rate:  [58-77] 74 (10/04 0800) Resp:  [8-22] 17 (10/04 0800) BP: (130-216)/(25-97) 141/88 mmHg (10/04 0800) SpO2:  [96 %-100 %] 99 % (10/04 0800) FiO2 (%):  [40 %] 40 % (10/03 2000) HEMODYNAMICS:   VENTILATOR SETTINGS: Vent Mode:  [-]  FiO2 (%):  [40 %] 40 % INTAKE / OUTPUT:  Intake/Output Summary (Last 24 hours) at 04/07/15 0900 Last data filed at 04/07/15 0800  Gross per 24 hour  Intake    460 ml  Output    796 ml  Net   -336 ml    PHYSICAL EXAMINATION: General:  Ill looking male off bipap. Old stroke noted Neuro:  RASS 1 . interacts HEENT:  No jvd/lan Cardiovascular:  Regular rate and rhythm normal heart sounds Lungs:  Clear to auscultation bilaterally. Mildly tachypneic but not paradoxical. No use of accessory muscles Abdomen:  Obese soft Musculoskeletal:  No obvious edema Skin:  Intact anteriorly  LABS:   PULMONARY  Recent Labs  Lab 04/05/15 1728 04/05/15 2120  PHART 7.330*  --   PCO2ART 59.6*  --   PO2ART 81.6  --   HCO3 29.9* 29.8*  TCO2 26.9 27.3  O2SAT 94.1 75.9    CBC  Recent Labs Lab 04/05/15 1650 04/06/15 0043 04/06/15 0430  HGB 14.6 13.0 13.0  HCT 49.9 45.2 45.0  WBC 9.0 9.7 8.3  PLT 174 162 151    COAGULATION  Recent Labs Lab 04/06/15 0043  INR 1.25    CARDIAC    Recent Labs Lab 04/05/15 2244 04/06/15 0430 04/06/15 1110  TROPONINI 0.23*  0.15* 0.09*   No results for input(s): PROBNP in the last 168 hours.   CHEMISTRY  Recent Labs Lab 04/05/15 1650 04/05/15 2244 04/06/15 0430  NA 145 146* 147*  K 5.7* 5.1 4.7  CL 108 110 111  CO2 34* 28 31  GLUCOSE 326* 322* 329*  BUN 73* 71* 66*  CREATININE 2.61* 2.28* 2.25*  CALCIUM 10.4* 10.0 9.8  MG  --   --  2.4  PHOS  --   --  2.7   Estimated Creatinine Clearance: 37.8 mL/min (by C-G formula based on Cr of 2.25).   LIVER  Recent Labs Lab 04/05/15 1650 04/06/15 0043 04/06/15 0430  AST 55*  --  39  ALT 42  --  37  ALKPHOS 71  --  75  BILITOT 0.9  --  0.4  PROT 8.1  --  7.3  ALBUMIN 2.8*  --  2.6*  INR  --  1.25  --      INFECTIOUS  Recent Labs Lab 04/05/15 1650  04/06/15 0043 04/06/15 0430 04/06/15 0630 04/06/15 1110  LATICACIDVEN  --   < > 3.1*  --  2.0 1.7  PROCALCITON 1.90  --   --  1.26  --   --   < > = values in this interval not displayed.   ENDOCRINE CBG (last 3)   Recent Labs  04/07/15 0121 04/07/15 0416 04/07/15 0749  GLUCAP 218* 249* 222*         IMAGING x48h  - image(s) personally visualized  -   highlighted in bold Ct Head Wo Contrast  04/05/2015   CLINICAL DATA:  Altered mental status. Patient reported to be altered from baseline. Patient nonverbal. No response of symptoms to or chronic reversal.  EXAM: CT HEAD WITHOUT CONTRAST  TECHNIQUE: Contiguous axial images were obtained from the base of the skull through the vertex without intravenous contrast.  COMPARISON:  03/01/2015.  FINDINGS: Generalized atrophy. Chronic microvascular ischemic change. 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 Compensatory enlargement LEFT lateral ventricle.  No acute stroke or acute hemorrhage. No extra-axial fluid. No mass lesion.  There is evidence for a LEFT frontal burr hole, otherwise the calvarium is intact. There is no acute  sinus or mastoid disease. No change from priors.  IMPRESSION: Remote LEFT hemisphere ischemic event is stable. No acute intracranial findings. No posttraumatic sequelae are evident.   Electronically Signed   By: Staci Righter M.D.   On: 04/05/2015 17:53   Ct Pelvis Wo Contrast  04/06/2015   CLINICAL DATA:  Altered mental status. Fever. Large ulcer/mass at the base of the penis with foul odor. Renal transplant 9 years prior. Large B-cell lymphoma. Recent pneumonia and urinary tract infection.  EXAM: CT PELVIS WITHOUT CONTRAST  TECHNIQUE: Multidetector CT imaging of the pelvis was performed  following the standard protocol without intravenous contrast.  COMPARISON:  03/03/2015 CT abdomen.  04/15/2010 CT abdomen/pelvis.  FINDINGS: Reproductive: Normal size prostate.  Bladder: Relatively collapsed urinary bladder. Foley catheter is in place within the urinary bladder. Gas within the nondependent bladder lumen is likely due to instrumentation. No definite bladder wall thickening. There is mild fullness of the right lower quadrant renal transplant collecting system without overt right hydronephrosis, stable since 03/03/2015. Tiny focus of gas within the medial transplant collecting system is nonspecific and likely due to the Foley catheter in the bladder. There is mild urothelial thickening throughout the transplant renal collecting system, nonspecific, stable since 03/03/2015. No peritransplant fluid collection.  Bowel: Moderate stool in the rectum. Oral contrast is seen extending to the distal rectum. No dilated small bowel loops in the pelvis. No appreciable bowel wall thickening.  Vascular/Lymphatic: No pathologically enlarged lymph nodes in the pelvis. Atherosclerosis is seen throughout the pelvic arteries.  Other: No pneumoperitoneum, ascites or focal fluid collection. There is nonspecific irregular skin thickening lateral and posterior to the proximal penile shaft (series 2/images 45-51) extending into the  anterior superior scrotal skin. There is no associated gas or fluid collection. No gas, significant hydrocele, mass or fluid collection is seen in the scrotum.  Musculoskeletal: No aggressive appearing focal osseous lesions. Calcification of the posterior annulus at L4-5 and L5-S1, likely representing chronic annular tears.  IMPRESSION: 1. Nonspecific irregular skin thickening lateral and posterior to the proximal penile shaft extending into the anterior superior scrotal skin, without associated gas or fluid collection. Given the history renal transplant, consider post transplant lymphoproliferative disorder (PTLD). Directed skin biopsy may be warranted. 2. Nonspecific gas in the bladder lumen and right renal transplant collecting system, likely due to instrumentation from the Foley catheter, cannot exclude gas producing urinary tract infection. Correlate with urinalysis. 3. Stable nonspecific urothelial thickening in the right renal transplant collecting system, which could indicate infection or rejection.   Electronically Signed   By: Ilona Sorrel M.D.   On: 04/06/2015 10:41   US Renal  04/06/2015   CLINICAL DATA:  Acute kidney injury superimposed on chronic kidney disease, history renal transplant  EXAM: RENAL / URINARY TRACT ULTRASOUND COMPLETE  COMPARISON:  04/06/2015, 02/27/2015  FINDINGS: Right Kidney:  Length: 8.7 cm. Marked increased echogenicity and diffuse atrophy. No hydronephrosis.  Left Kidney:  Length: 9.3 cm. Diffuse increased echogenicity and atrophy, not as severe as the right kidney. Echogenic shadowing lower pole calculus measures 8 mm. No hydronephrosis.  Right lower quadrant transplant kidney: Normal echogenicity and cortical thickness. Nechemia Chiappetta pelviectasis. No hydronephrosis. Transplant kidney measures 10.3 cm.  Bladder:  Not visualized.  Decompressed by Foley catheter.  Additional findings include numerous gallstones within the gallbladder.  IMPRESSION: Marked atrophy of the native kidneys  without obstruction or hydronephrosis  Mild pelviectasis of the transplant kidney  Cholelithiasis   Electronically Signed   By: Jerilynn Mages.  Shick M.D.   On: 04/06/2015 12:45   Dg Chest Port 1 View  04/07/2015   CLINICAL DATA:  Followup exam for pneumonia versus pulmonary edema.  EXAM: PORTABLE CHEST 1 VIEW  COMPARISON:  04/05/2015  FINDINGS: Irregular interstitial densities and bilateral perihilar and lower lung zone airspace opacities have improved since the prior study. There are no new lung opacities. No convincing pleural effusion or pneumothorax. Cardiac silhouette is normal in size and configuration. Normal mediastinal and hilar contours.  IMPRESSION: Improved lung aeration most consistent with improved pulmonary edema. No convincing pneumonia.   Electronically Signed  By: Lajean Manes M.D.   On: 04/07/2015 08:55   Dg Chest Port 1 View  04/05/2015   CLINICAL DATA:  Altered mental status, hypotension, diabetes  EXAM: PORTABLE CHEST 1 VIEW  COMPARISON:  03/01/2015  FINDINGS: Cardiac size exaggerated by kyphotic positioning and low inspiratory effect. Allowing for this is likely upper normal. Vascular pattern appears without pulmonary venous hypertension. There are increased interstitial markings as well as perihilar infiltrates bilaterally. These are new from the prior study.  IMPRESSION: Appearance suggests pulmonary edema is a possibility. Bilateral lower lobe pneumonia could also be considered.   Electronically Signed   By: Skipper Cliche M.D.   On: 04/05/2015 16:48      Anti-infectives    Start     Dose/Rate Route Frequency Ordered Stop   04/06/15 1700  vancomycin (VANCOCIN) IVPB 1000 mg/200 mL premix  Status:  Discontinued     1,000 mg 200 mL/hr over 60 Minutes Intravenous Every 24 hours 04/05/15 1837 04/06/15 0713   04/06/15 1400  vancomycin (VANCOCIN) 1,250 mg in sodium chloride 0.9 % 250 mL IVPB     1,250 mg 166.7 mL/hr over 90 Minutes Intravenous Every 24 hours 04/06/15 0713     04/06/15  1000  cefTAZidime (FORTAZ) 1 g in dextrose 5 % 50 mL IVPB     1 g 100 mL/hr over 30 Minutes Intravenous Every 12 hours 04/06/15 0713     04/05/15 1830  cefTAZidime (FORTAZ) 1 g in dextrose 5 % 50 mL IVPB  Status:  Discontinued     1 g 100 mL/hr over 30 Minutes Intravenous Every 24 hours 04/05/15 1826 04/06/15 0713   04/05/15 1615  piperacillin-tazobactam (ZOSYN) IVPB 3.375 g     3.375 g 100 mL/hr over 30 Minutes Intravenous  Once 04/05/15 1614 04/05/15 1826   04/05/15 1615  vancomycin (VANCOCIN) IVPB 1000 mg/200 mL premix     1,000 mg 200 mL/hr over 60 Minutes Intravenous  Once 04/05/15 1614 04/05/15 1808       ASSESSMENT / PLAN:  PULMONARY BiPAP 04/05/15>>10/4  A: Acute resp failure hypoxemic needing BiPAP. Also some amount of hypercarbia. He has pulmonary infiltrates. BiPAP need is due to encephalopathy and pulmonary infiltrates.  - Improved since admission   P:   PRN BiPAP Treatment infiltrates with anabiotic's for HCAP plus minus diuresis Intubate if he gets worse If intubated consider bronchoscopy due to immunosuppressed status  CARDIOVASCULAR  A: History of chronic systolic heart failure P:  Repeat echocardiogram, grade 1 dysfunction, no acute process.   RENAL  Recent Labs Lab 04/05/15 1650 04/05/15 2244 04/06/15 0430  CREATININE 2.61* 2.28* 2.25*     A:  Status post renal transplant. He is on immunosuppressants. Does not follow with renal. Baseline creatinine is 1.5 mg percent -Currently acute on chronic kidney insufficiency  P:    Renal consulted Transplant medications per primary team  GASTROINTESTINAL A:  Respiratory distress. Nursing reports mass in abd P:   Nothing by mouth Recommend swallow eval when better especially given previous stroke to rule out dysphagia Await CT abdomen  HEMATOLOGIC A:  Nil acute P:  Monitor DVT prophylaxis according to primary team  INFECTIOUS A:  High pretest probability for HCAP along with mild lactic  acidosis that has cleared P:   Await cultures Antibiotic's per primary team   ENDOCRINE A:  Per primary team P:   Per primary team  NEUROLOGIC A:  Acute encephalopathy(resolved). Probably related to sepsis. Did not respond to Narcan.? Slowly improving. At  risk for. Opioid Withdrawal, monitor closely.  Current RASS 1  P:   RASS goal: *0  Use Precedex if needed.  Currently avoid opiates and benzos    FAMILY  - Updates:No Family bedside - Inter-disciplinary family meet or Palliative Care meeting due by:  04/12/2015   TODAY'S SUMMARY:Respiratory improved. NAD at rest.  Richardson Landry Mairen Wallenstein ACNP Maryanna Shape PCCM Pager [phone] till 3 pm If no answer page [phone] 04/07/2015, 9:01 AM

## 2015-04-08 ENCOUNTER — Inpatient Hospital Stay (HOSPITAL_COMMUNITY): Payer: Medicare Other

## 2015-04-08 DIAGNOSIS — N485 Ulcer of penis: Secondary | ICD-10-CM

## 2015-04-08 DIAGNOSIS — Z794 Long term (current) use of insulin: Secondary | ICD-10-CM

## 2015-04-08 DIAGNOSIS — N179 Acute kidney failure, unspecified: Secondary | ICD-10-CM

## 2015-04-08 DIAGNOSIS — N189 Chronic kidney disease, unspecified: Secondary | ICD-10-CM

## 2015-04-08 DIAGNOSIS — N183 Chronic kidney disease, stage 3 (moderate): Secondary | ICD-10-CM

## 2015-04-08 DIAGNOSIS — E1122 Type 2 diabetes mellitus with diabetic chronic kidney disease: Secondary | ICD-10-CM

## 2015-04-08 LAB — BASIC METABOLIC PANEL
Anion gap: 2 — ABNORMAL LOW (ref 5–15)
BUN: 65 mg/dL — AB (ref 6–20)
CALCIUM: 10.2 mg/dL (ref 8.9–10.3)
CO2: 32 mmol/L (ref 22–32)
CREATININE: 1.91 mg/dL — AB (ref 0.61–1.24)
Chloride: 113 mmol/L — ABNORMAL HIGH (ref 101–111)
GFR calc Af Amer: 42 mL/min — ABNORMAL LOW (ref >60)
GFR, EST NON AFRICAN AMERICAN: 36 mL/min — AB (ref >60)
GLUCOSE: 154 mg/dL — AB (ref 65–99)
Potassium: 4.1 mmol/L (ref 3.5–5.1)
Sodium: 147 mmol/L — ABNORMAL HIGH (ref 135–145)

## 2015-04-08 LAB — GLUCOSE, CAPILLARY
GLUCOSE-CAPILLARY: 126 mg/dL — AB (ref 65–99)
GLUCOSE-CAPILLARY: 139 mg/dL — AB (ref 65–99)
GLUCOSE-CAPILLARY: 161 mg/dL — AB (ref 65–99)
GLUCOSE-CAPILLARY: 181 mg/dL — AB (ref 65–99)
GLUCOSE-CAPILLARY: 204 mg/dL — AB (ref 65–99)

## 2015-04-08 MED ORDER — VITAL HIGH PROTEIN PO LIQD: 1000.0000 mL | ORAL | Status: DC

## 2015-04-08 MED ORDER — GLUCERNA 1.2 CAL PO LIQD
1000.0000 mL | ORAL | Status: DC
  Administered 2015-04-08 – 2015-04-10 (×4): 1000 mL
  Filled 2015-04-08 (×4): qty 1000

## 2015-04-08 MED ORDER — SODIUM CHLORIDE 0.9 % IV SOLN
INTRAVENOUS | Status: DC
  Administered 2015-04-08: 1000 mL via INTRAVENOUS
  Administered 2015-04-10: 01:00:00 via INTRAVENOUS

## 2015-04-08 NOTE — Progress Notes (Signed)
Daughter has called to cancel palliative consultation/family meeting. Currently I recommend treatment of medically reversible illness and palliative consultation at SNF for ongoing goals of care. Family at this juncture not accepting of palliative assistance or partnership in caring for this patient. Please re-consult [place] if they have a desire to meet with our team or if the patient's condition changes and there are complex symptom management needs. Will sign off for now.  Lane Hacker, DO Palliative Medicine [phone]

## 2015-04-08 NOTE — Progress Notes (Signed)
[facility] PROGRESS NOTE  Justin [name] ZRA:[id number] DOB: [birth year] DOA: 04/05/2015 PCP: Garwin Brothers, MD  Assessment/Plan: 1. Acute encephalopathy- likely from the acute respiratory failure, healthcare associated pneumonia. Patient admitted to step down unit and started on vancomycin and Ceftazidime. Blood cultures 2 have been negative so far. Patient is slowly improving.  2. Sepsis due to UTI- patient presented with sepsis, urine culture is growing Escherichia coli. Continue IV Ceftazidime. 3. 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 Today creatinine is 2.05 4. Hypernatremia- sodium was 152, likely from dehydration, started on D5W at 50 ml/hr, will check BMP today. 5. History of cadaveric  renal transplant- continue Prograf. 6. Penile/pubic ulcer- patient has large penile/pubic ulcer, no discharge noted. Discussed with patient's daughter who says it has been there for many years. He was seen by dermatology as well as urology as outpatient. Will keep the lesion clean and dry. Follow-up as outpatient. 7. Diabetes mellitus- continue sliding scale insulin with NovoLog. Continue Lantus 10 units daily. 8. Hypothermia- likely from sepsis, continue bear hugger blanket. 9. Hypotension- patient has been having intermittent episodes of hypotension, will hold antihypertensives medications at this time. 10. Nutrition- patient has PEG tube in place, will get dietitian consult to start nutrition. 11. DVT prophylaxis- Lovenox  Code Status: Full code Family Communication: *Discussed with patient's daughter on the phone Disposition Plan: Skilled nursing facility when medically stable   Consultants:  None  Procedures:  None  Antibiotics:  Vancomycin  Ceftazidime  HPI/Subjective: 59 year old male with a history of B-cell lymphoma which was developed after an he received chronic venous suppression due to history of cadaveric renal  transplant. Patient underwent biopsy of the lesion after that he sustained intracranial hemorrhage resulting in right-sided weakness and dysphasia requiring tracheostomy and PEG tube placement. Patient has been at the nursing home facility. Patient's lymphoma was treated with whole brain radiation and was deemed not a candidate for chemotherapy. Patient was brought to the hospital for worsening confusion and hypoxia. He was found to be in sepsis due to UTI and possible pneumonia  This morning patient continues to be nonverbal.  Objective: Filed Vitals:   04/08/15 1258  BP: 88/50  Pulse: 78  Temp: 98.1 F (36.7 C)  Resp: 16    Intake/Output Summary (Last 24 hours) at 04/08/15 1403 Last data filed at 04/08/15 0700  Gross per 24 hour  Intake    250 ml  Output    211 ml  Net     39 ml   Filed Weights   04/05/15 1712  Weight: 81.874 kg (180 lb 8 oz)    Exam:   General:  Appears in no acute distress  Cardiovascular: S1-S2 is regular  Respiratory: Clear to auscultation bilaterally  Abdomen: Soft, nontender, no organomegaly  Musculoskeletal: No edema of the lower extremities noted   Skin- fungating lesion, with chronic sloughing noted on the dorsal aspect of the penis and pubic area  Data Reviewed: Basic Metabolic Panel:  Recent Labs Lab 04/05/15 1650 04/05/15 2244 04/06/15 0430 04/07/15 1020  NA 145 146* 147* 152*  K 5.7* 5.1 4.7 4.6  CL 108 110 111 115*  CO2 34* 28 31 30   GLUCOSE 326* 322* 329* 221*  BUN 73* 71* 66* 63*  CREATININE 2.61* 2.28* 2.25* 2.05*  CALCIUM 10.4* 10.0 9.8 10.6*  MG  --   --  2.4  --   PHOS  --   --  2.7  --  Liver Function Tests:  Recent Labs Lab 04/05/15 1650 04/06/15 0430 04/07/15 1020  AST 55* 39 21  ALT 42 37 27  ALKPHOS 71 75 76  BILITOT 0.9 0.4 0.8  PROT 8.1 7.3 8.0  ALBUMIN 2.8* 2.6* 2.7*   No results for input(s): LIPASE, AMYLASE in the last 168 hours. No results for input(s): AMMONIA in the last 168  hours. CBC:  Recent Labs Lab 04/05/15 1650 04/06/15 0043 04/06/15 0430 04/07/15 0830  WBC 9.0 9.7 8.3 7.6  NEUTROABS 7.0  --   --   --   HGB 14.6 13.0 13.0 14.8  HCT 49.9 45.2 45.0 49.8  MCV 95.2 96.0 93.9 92.4  PLT 174 162 151 174   Cardiac Enzymes:  Recent Labs Lab 04/05/15 2244 04/06/15 0430 04/06/15 1110  TROPONINI 0.23* 0.15* 0.09*   BNP (last 3 results) No results for input(s): BNP in the last 8760 hours.  ProBNP (last 3 results) No results for input(s): PROBNP in the last 8760 hours.  CBG:  Recent Labs Lab 04/07/15 1612 04/07/15 1939 04/07/15 2359 04/08/15 0358 04/08/15 0827  GLUCAP 145* 152* 161* 181* 126*    Recent Results (from the past 240 hour(s))  Blood Culture (routine x 2)     Status: None (Preliminary result)   Collection Time: 04/05/15  4:45 PM  Result Value Ref Range Status   Specimen Description BLOOD RIGHT AC  5 ML IN [facility] BOTTLE  Final   Special Requests NONE  Final   Culture   Final    NO GROWTH 3 DAYS Performed at [facility]    Report Status PENDING  Incomplete  Blood Culture (routine x 2)     Status: None (Preliminary result)   Collection Time: 04/05/15  4:50 PM  Result Value Ref Range Status   Specimen Description BLOOD RIGHT HAND  5 ML IN [facility] BOTTLE  Final   Special Requests NONE  Final   Culture   Final    NO GROWTH 3 DAYS Performed at [facility]    Report Status PENDING  Incomplete  Urine culture     Status: None   Collection Time: 04/05/15  5:00 PM  Result Value Ref Range Status   Specimen Description URINE, CATHETERIZED  Final   Special Requests NONE  Final   Culture   Final    >=100,000 COLONIES/mL ESCHERICHIA COLI Performed at [facility]    Report Status 04/07/2015 FINAL  Final   Organism ID, Bacteria ESCHERICHIA COLI  Final      Susceptibility   Escherichia coli - MIC*    AMPICILLIN >=32 RESISTANT Resistant     CEFAZOLIN <=4 SENSITIVE Sensitive     CEFTRIAXONE <=1 SENSITIVE  Sensitive     CIPROFLOXACIN >=4 RESISTANT Resistant     GENTAMICIN >=16 RESISTANT Resistant     IMIPENEM <=0.25 SENSITIVE Sensitive     NITROFURANTOIN <=16 SENSITIVE Sensitive     TRIMETH/SULFA <=20 SENSITIVE Sensitive     AMPICILLIN/SULBACTAM 16 INTERMEDIATE Intermediate     PIP/TAZO <=4 SENSITIVE Sensitive     * >=100,000 COLONIES/mL ESCHERICHIA COLI  MRSA PCR Screening     Status: None   Collection Time: 04/05/15  9:44 PM  Result Value Ref Range Status   MRSA by PCR NEGATIVE NEGATIVE Final    Comment:        The GeneXpert MRSA Assay (FDA approved for NASAL specimens only), is one component of a comprehensive MRSA colonization surveillance program. It is not intended to  diagnose MRSA infection nor to guide or monitor treatment for MRSA infections.      Studies: Dg Chest Port 1 View  04/08/2015   CLINICAL DATA:  Respiratory failure.  EXAM: PORTABLE CHEST 1 VIEW  COMPARISON:  04/07/2015.  FINDINGS: Mediastinum and hilar structures are unremarkable. Heart size normal. Left lower lobe infiltrate consistent pneumonia. No pleural effusion or pneumothorax. Previously identified gastrostomy tube not definitely visualized. Stable calcific density left upper quadrant.  IMPRESSION: Left lower lobe infiltrate consistent with pneumonia.   Electronically Signed   By: Marcello Moores  Register   On: 04/08/2015 07:08   Dg Chest Port 1 View  04/07/2015   CLINICAL DATA:  Followup exam for pneumonia versus pulmonary edema.  EXAM: PORTABLE CHEST 1 VIEW  COMPARISON:  04/05/2015  FINDINGS: Irregular interstitial densities and bilateral perihilar and lower lung zone airspace opacities have improved since the prior study. There are no new lung opacities. No convincing pleural effusion or pneumothorax. Cardiac silhouette is normal in size and configuration. Normal mediastinal and hilar contours.  IMPRESSION: Improved lung aeration most consistent with improved pulmonary edema. No convincing pneumonia.    Electronically Signed   By: Lajean Manes M.D.   On: 04/07/2015 08:55    Scheduled Meds: . antiseptic oral rinse  7 mL Mouth Rinse q12n4p  . aspirin  81 mg Oral Daily  . cefTAZidime (FORTAZ)  IV  1 g Intravenous Q12H  . chlorhexidine  15 mL Mouth Rinse BID  . enoxaparin (LOVENOX) injection  40 mg Subcutaneous QHS  . feeding supplement (GLUCERNA 1.2 CAL)  1,000 mL Per Tube Q24H  . fentaNYL  25 mcg Transdermal Q72H  . gabapentin  300 mg Oral TID  . Influenza vac split quadrivalent PF  0.5 mL Intramuscular Tomorrow-1000  . insulin aspart  0-15 Units Subcutaneous Q4H  . insulin glargine  10 Units Subcutaneous QHS  . lamoTRIgine  50 mg Per Tube Daily  . predniSONE  5 mg Per Tube Daily  . sodium chloride  3 mL Intravenous Q12H  . sucralfate  1 g Per Tube 3 times per day  . tacrolimus  0.5 mg Oral BID  . tacrolimus  3 mg Oral BID  . vancomycin  1,250 mg Intravenous Q24H   Continuous Infusions: . dextrose 50 mL/hr at 04/07/15 1213    Active Problems:   Diabetes mellitus ([place])   Essential hypertension   UTI (lower urinary tract infection)   Sepsis ([place])   Acute renal failure superimposed on stage 3 chronic kidney disease (HCC)   Pneumonia   S/p cadaver renal transplant   Acute respiratory failure with hypoxia and hypercarbia (HCC)   Chronic systolic CHF (congestive heart failure) (HCC)   ESRD (end stage renal disease) ([place])   Penile ulcer   Acute kidney injury superimposed on CKD ([place])    Time spent: *25 min    [facility]  [facility] Pager [phone]*. If 7PM-7AM, please contact night-coverage at [email or web address], password [facility] 04/08/2015, 2:03 PM  LOS: 3 days

## 2015-04-08 NOTE — Evaluation (Signed)
Clinical/Bedside Swallow Evaluation Patient Details  Name: Justin [name] MRN: [id number] Date of Birth: September 13, 1955  Today's Date: 04/08/2015 Time: SLP Start Time (ACUTE ONLY): 73 SLP Stop Time (ACUTE ONLY): 1150 SLP Time Calculation (min) (ACUTE ONLY): 20 min  Past Medical History:  Past Medical History  Diagnosis Date  . Hypertension   . Dyslipidemia   . Carpal tunnel syndrome   . A-V fistula ([place])   . Anemia   . Hemorrhoids   . Hyperparathyroidism   . Intracranial hemorrhage ([place])   . Hypotension   . Hyperkalemia   . Kidney disease   . Sepsis(995.91)   . GI bleeding   . DVT (deep venous thrombosis) ([place])   . PVD (peripheral vascular disease) ([place])   . DM (diabetes mellitus) ([place])   . Cellulitis   . IV infiltration     Right upper extremity  . PPD positive   . 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 Hypokinesis     EF of 60% with mild anterior hypokinesis, minimal coronary artery disease on catheterization in December of 2007  . Gout   . Renal transplant, status post 10/19/2014   Past Surgical History:  Past Surgical History  Procedure Laterality Date  . Kidney transplant      History of renal transplant maintained on chronic immunosuppressive therapy  . Av fistula placement      Right arm  . Peripherally inserted central catheter insertion      line placement  . Amputation    . Gastrostomy    . 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 Brain biopsy    . #6 shiley cuff      Less trache   HPI:  59 year old male admitted 04/05/15 due to confusion and unresponsiveness. PMH significant for ICH, large B-cell lymphoma. Orders received for BSE.   Assessment / Plan / Recommendation Clinical Impression  Pt presents with right facial weakness, residual from [place]. no anterior leakage or oral residue of thin, nectar, or puree consistencies. Pt had MBS in April 2016, which revealed aspiration of thin liquids. Pt  was placed on puree/nectar thick liquids at that time. Given pt history and communicative impairment, recommend repeat MBS to determine least restrictive diet.    Aspiration Risk  Mild    Diet Recommendation NPO (pending MBS)        Other  Recommendations Oral Care Recommendations: Oral care BID;Staff/trained caregiver to provide oral care Other Recommendations: Have oral suction available   Follow Up Recommendations       Frequency and Duration        Pertinent Vitals/Pain No pain indicated    SLP Swallow Goals  MBS to identify least restrictive diet.   Swallow Study Prior Functional Status   History of dysphagia with PEG tube, aphasic.    General Date of Onset: 04/05/15 Other Pertinent Information: 59 year old male admitted 04/05/15 due to confusion and unresponsiveness. PMH significant for ICH, large B-cell lymphoma. Orders received for BSE. Type of Study: Bedside swallow evaluation Previous Swallow Assessment: MBS 10/21/14 Diet Prior to this Study: NPO;PEG tube Temperature Spikes Noted: No Respiratory Status: Supplemental O2 delivered via (comment) ([place]) History of Recent Intubation: No Behavior/Cognition: Alert;Cooperative;Doesn't follow directions;Distractible;Confused;Pleasant mood Oral Cavity - Dentition: Edentulous Self-Feeding Abilities: Total assist Patient Positioning: Upright in bed Baseline Vocal Quality: Low vocal intensity Volitional Cough: Cognitively unable to elicit Volitional Swallow: Unable to elicit  Oral/Motor/Sensory Function Overall Oral Motor/Sensory Function: Impaired at baseline Labial ROM: Reduced right Labial Symmetry: Abnormal symmetry right Labial Strength: Within Functional Limits Lingual ROM: Reduced right Lingual Strength: Within Functional Limits Facial ROM: Reduced right Facial Symmetry: Right droop Facial Strength: Reduced Mandible: Within Functional Limits   Ice Chips Ice chips: Not tested   Thin Liquid Thin Liquid: Within  functional limits Presentation: Straw    Nectar Thick Nectar Thick Liquid: Within functional limits Presentation: Spoon   Honey Thick Honey Thick Liquid: Not tested   Puree Puree: Within functional limits Presentation: Spoon   Solid   GO    Solid: Not tested       Shonna Chock 04/08/2015,11:59 AM   Enriqueta Shutter. [facility], [place] [phone]

## 2015-04-08 NOTE — Clinical Social Work Note (Signed)
Clinical Social Work Assessment  Patient Details  Name: Justin [name] MRN: [id number] Date of Birth: 1956/03/17  Date of referral:  04/08/15               Reason for consult:  Facility Placement, Discharge Planning                Permission sought to share information with:  Facility Art therapist granted to share information::  Yes, Verbal Permission Granted  Name::        Agency::     Relationship::     Contact Information:     Housing/Transportation Living arrangements for the past 2 months:  [place] of Information:  Adult Children Patient Interpreter Needed:  None Criminal Activity/Legal Involvement Pertinent to Current Situation/Hospitalization:  No - Comment as needed Significant Relationships:  Adult Children Lives with:  Facility Resident Do you feel safe going back to the place where you live?  Yes Need for family participation in patient care:  Yes (Comment)  Care giving concerns: Daughter's cell number provided to SNF liaison to assist with concerns. Daughter reports that she has had some difficulty connecting with the right staff at [facility] when she does have concerns.   Social Worker assessment / plan:  Pt hospitalized on 04/05/15 from [facility] with Sever Sepsis and additional medical issues. Pt is non verbal and disoriented. CSW spoke with pt's daughter, Justin [name], to assist with d/c planning. Daughter plans for pt to return to SNF at d/c. Clinicals sent to SNF for review. D/C plan for pt to return to SNF at d/c has been confirmed.  Employment status:  Disabled (Comment on whether or not currently receiving Disability) Insurance information:  Medicare, Medicaid In [place] PT Recommendations:  Not assessed at this time Information / Referral to community resources:  Other (Comment Required) (SNF liasion contact info)  Patient/Family's Response to care:  Daughter is in agreement with d/c.  Patient/Family's Understanding of and  Emotional Response to Diagnosis, Current Treatment, and Prognosis:  Daughter is aware of pt's medical status. Daughter appreciates assistance connecting with liaison at SNF.  Emotional Assessment Appearance:  Appears stated age Attitude/Demeanor/Rapport:  Other (cooperative) Affect (typically observed):  Calm Orientation:    Alcohol / Substance use:  Not Applicable Psych involvement (Current and /or in the community):  No (Comment)  Discharge Needs  Concerns to be addressed:  No discharge needs identified Readmission within the last 30 days:  No Current discharge risk:  None Barriers to Discharge:  No Barriers Identified   Justin [name], [place] 04/08/2015, 3:01 PM

## 2015-04-08 NOTE — Progress Notes (Signed)
Initial Nutrition Assessment  INTERVENTION:   Initiate Glucerna 1.2 @ 15 ml/hr via PEG and increase by 10 ml every 4 hours to goal rate of 75 ml/hr.    Tube feeding regimen provides 2160 kcal (100% of needs), 108 grams of protein, and 1449 ml of H2O.   RD to continue to monitor  NUTRITION DIAGNOSIS:   Inadequate oral intake related to inability to eat as evidenced by NPO status.  GOAL:   Patient will meet greater than or equal to 90% of their needs  MONITOR:   Labs, Weight trends, TF tolerance, Skin, I & O's  REASON FOR ASSESSMENT:   Consult Enteral/tube feeding initiation and management  ASSESSMENT:   59 y.o. year-old with hx of HTN, HL, DVT, DM2, PVD, GIB , hx fall/ SDH w R hemiparesis/ PEG/ bedridden/ lives in [place]. 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 Patient presented with sent from [facility] where he was nonverbal and recently dx'd with PNA.  Pt is s/p kidney transplant in April per medical chart. Pt had PEG placed in September.  Pt in room with no family present, asleep with bear hugger over him.  SLP evaluated, recommend pt remain NPO until a MBS can be performed.   During previous admission at [facility], pt was started on Glucerna 1.2. RD to order Glucerna 1.2 with goal rate being 75 ml/hr to meet needs.  Per weight history, pt has lost 35 lb since December 2015 (16% weight loss x 10 months, significant for time frame).  Nutrition focused physical exam shows no sign of depletion of muscle mass or body fat.  Labs reviewed: CBGs: 126-181 Elevated Na, BUN & Creatinine  Diet Order:  Diet NPO time specified  Skin:  Wound (see comment) (penile ulcer)  Last BM:  10/5  Height:   Ht Readings from Last 1 Encounters:  04/05/15 6' (1.829 m)    Weight:   Wt Readings from Last 1 Encounters:  04/05/15 180 lb 8 oz (81.874 kg)    Ideal Body Weight:  80.9 kg  BMI:  Body mass index is 24.47 kg/(m^2).  Estimated Nutritional Needs:   Kcal:   2100-2300  Protein:  100-110g  Fluid:  2.2L/day  EDUCATION NEEDS:   No education needs identified at this time  Clayton Bibles, MS, RD, LDN Pager: [phone] After Hours Pager: [phone]

## 2015-04-08 NOTE — Progress Notes (Signed)
MD notified about pts body temp of 94.8 F. Orders for baer hugger were given via phone. Bear hugger applied. Will continue to monitor the pt and reassess temp.

## 2015-04-08 NOTE — Procedures (Signed)
Objective Swallowing Evaluation:  (MBS)  Patient Details  Name: Justin [name] MRN: [id number] Date of Birth: May 12, 1956  Today's Date: 04/08/2015 Time: SLP Start Time (ACUTE ONLY): 1435-SLP Stop Time (ACUTE ONLY): 1450 SLP Time Calculation (min) (ACUTE ONLY): 15 min  Past Medical History:  Past Medical History  Diagnosis Date  . Hypertension   . Dyslipidemia   . Carpal tunnel syndrome   . A-V fistula ([place])   . Anemia   . Hemorrhoids   . Hyperparathyroidism   . Intracranial hemorrhage ([place])   . Hypotension   . Hyperkalemia   . Kidney disease   . Sepsis(995.91)   . GI bleeding   . DVT (deep venous thrombosis) ([place])   . PVD (peripheral vascular disease) ([place])   . DM (diabetes mellitus) ([place])   . Cellulitis   . IV infiltration     Right upper extremity  . PPD positive   . 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 Hypokinesis     EF of 60% with mild anterior hypokinesis, minimal coronary artery disease on catheterization in December of 2007  . Gout   . Renal transplant, status post 10/19/2014   Past Surgical History:  Past Surgical History  Procedure Laterality Date  . Kidney transplant      History of renal transplant maintained on chronic immunosuppressive therapy  . Av fistula placement      Right arm  . Peripherally inserted central catheter insertion      line placement  . Amputation    . Gastrostomy    . 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 Brain biopsy    . #6 shiley cuff      Less trache   HPI:  Other Pertinent Information: 59 year old male admitted 04/05/15 due to confusion and unresponsiveness. PMH significant for ICH, large B-cell lymphoma. Orders received for BSE, which revealed no overt s/s aspiration of any consistency tested. However, pt with history of com/cog deficits and dysphagia, so MBS warranted.  No Data Recorded  Assessment / Plan / Recommendation CHL IP CLINICAL IMPRESSIONS  04/08/2015  Therapy Diagnosis Moderate oral phase dysphagia;Mild pharyngeal phase dysphagia  Clinical Impression Pt presents with moderate oral and mild pharyngeal sensory and motor based dysphagia, characterized as follows: Orally, pt exhibits adequate manipulation of thin and puree consistencies, however, was unable to masticate solid or propel barium tablet posteriorly. Both solid and pill were removed from pt oral cavity. Pharyngeally, Pt demonstrated delayed swallow reflex on puree and thin consistencies, with trigger at the level of the vallecular sinus. Puree consistency was tolerated without penetration, aspiration, or post-swallow residue. Thin liquid via straw was tolerated without penetration, aspiration or post-swallow residue when bolus size and rate were limited to small sips one at a time. Trace silent aspiration did occur during the swallow of large consecutive boluses. Recommend puree diet and thin liquids via straw, limiting bolus size and rate to 1 small sip at a time. Crushed meds, alternating solids and liquids, and regular thorough oral care are also recommended.  ST to follow for diet tolerance.      CHL IP TREATMENT RECOMMENDATION 04/08/2015  Treatment Recommendations Therapy as outlined in treatment plan below     CH[facility] IP DIET RECOMMENDATION 04/08/2015  SLP Diet Recommendations Dysphagia 1 (Puree);Thin  Liquid Administration via (None)  Medication Administration Crushed with puree  Compensations Small sips/bites;Slow rate;Follow solids with liquid  Postural  Changes and/or Swallow Maneuvers (None)     CHL IP OTHER RECOMMENDATIONS 04/08/2015  Recommended Consults (None)  Oral Care Recommendations Oral care BID;Staff/trained caregiver to provide oral care  Other Recommendations Have oral suction available     CHL IP FOLLOW UP RECOMMENDATIONS 03/04/2015  Follow up Recommendations Skilled Nursing facility     [facility] IP FREQUENCY AND DURATION 04/08/2015  Speech Therapy Frequency  (ACUTE ONLY) min 1 x/week  Treatment Duration 1 week     Pertinent Vitals/Pain VSS, no pain evident    SLP Swallow Goals No flowsheet data found.  No flowsheet data found.    CHL IP REASON FOR REFERRAL 04/08/2015  Reason for Referral Objectively evaluate swallowing function     CHL IP ORAL PHASE 04/08/2015  Lips (None)  Tongue (None)  Mucous membranes (None)  Nutritional status (None)  Other (None)  Oxygen therapy (None)  Oral Phase Impaired  Oral - Pudding Teaspoon (None)  Oral - Pudding Cup (None)  Oral - Honey Teaspoon (None)  Oral - Honey Cup (None)  Oral - Honey Syringe (None)  Oral - Nectar Teaspoon (None)  Oral - Nectar Cup (None)  Oral - Nectar Straw (None)  Oral - Nectar Syringe (None)  Oral - Ice Chips (None)  Oral - Thin Teaspoon (None)  Oral - Thin Cup (None)  Oral - Thin Straw (None)  Oral - Thin Syringe (None)  Oral - Puree (None)  Oral - Mechanical Soft (None)  Oral - Regular (None)  Oral - Multi-consistency (None)  Oral - Pill (None)  Oral Phase - Comment (None)      CHL IP PHARYNGEAL PHASE 04/08/2015  Pharyngeal Phase Impaired  Pharyngeal - Pudding Teaspoon (None)  Penetration/Aspiration details (pudding teaspoon) (None)  Pharyngeal - Pudding Cup (None)  Penetration/Aspiration details (pudding cup) (None)  Pharyngeal - Honey Teaspoon (None)  Penetration/Aspiration details (honey teaspoon) (None)  Pharyngeal - Honey Cup (None)  Penetration/Aspiration details (honey cup) (None)  Pharyngeal - Honey Syringe (None)  Penetration/Aspiration details (honey syringe) (None)  Pharyngeal - Nectar Teaspoon (None)  Penetration/Aspiration details (nectar teaspoon) (None)  Pharyngeal - Nectar Cup (None)  Penetration/Aspiration details (nectar cup) (None)  Pharyngeal - Nectar Straw (None)  Penetration/Aspiration details (nectar straw) (None)  Pharyngeal - Nectar Syringe (None)  Penetration/Aspiration details (nectar syringe) (None)  Pharyngeal - Ice Chips  (None)  Penetration/Aspiration details (ice chips) (None)  Pharyngeal - Thin Teaspoon (None)  Penetration/Aspiration details (thin teaspoon) (None)  Pharyngeal - Thin Cup (None)  Penetration/Aspiration details (thin cup) (None)  Pharyngeal - Thin Straw (None)  Penetration/Aspiration details (thin straw) (None)  Pharyngeal - Thin Syringe (None)  Penetration/Aspiration details (thin syringe') (None)  Pharyngeal - Puree (None)  Penetration/Aspiration details (puree) (None)  Pharyngeal - Mechanical Soft (None)  Penetration/Aspiration details (mechanical soft) (None)  Pharyngeal - Regular (None)  Penetration/Aspiration details (regular) (None)  Pharyngeal - Multi-consistency (None)  Penetration/Aspiration details (multi-consistency) (None)  Pharyngeal - Pill (None)  Penetration/Aspiration details (pill) (None)  Pharyngeal Comment (None)      CHL IP CERVICAL ESOPHAGEAL PHASE 04/08/2015  Cervical Esophageal Phase WFL  Pudding Teaspoon (None)  Pudding Cup (None)  Honey Teaspoon (None)  Honey Cup (None)  Honey Straw (None)  Nectar Teaspoon (None)  Nectar Cup (None)  Nectar Straw (None)  Nectar Sippy Cup (None)  Thin Teaspoon (None)  Thin Cup (None)  Thin Straw (None)  Thin Sippy Cup (None)  Cervical Esophageal Comment (None)         Shonna Chock 04/08/2015, 3:03 PM  Tyyonna Soucy B. [facility], [place] [phone]

## 2015-04-09 ENCOUNTER — Inpatient Hospital Stay (HOSPITAL_COMMUNITY): Payer: Medicare Other

## 2015-04-09 DIAGNOSIS — M7989 Other specified soft tissue disorders: Secondary | ICD-10-CM

## 2015-04-09 DIAGNOSIS — N390 Urinary tract infection, site not specified: Secondary | ICD-10-CM

## 2015-04-09 DIAGNOSIS — A419 Sepsis, unspecified organism: Principal | ICD-10-CM

## 2015-04-09 DIAGNOSIS — N186 End stage renal disease: Secondary | ICD-10-CM

## 2015-04-09 LAB — GLUCOSE, CAPILLARY
GLUCOSE-CAPILLARY: 142 mg/dL — AB (ref 65–99)
GLUCOSE-CAPILLARY: 159 mg/dL — AB (ref 65–99)
GLUCOSE-CAPILLARY: 184 mg/dL — AB (ref 65–99)
GLUCOSE-CAPILLARY: 196 mg/dL — AB (ref 65–99)
Glucose-Capillary: 141 mg/dL — ABNORMAL HIGH (ref 65–99)
Glucose-Capillary: 185 mg/dL — ABNORMAL HIGH (ref 65–99)
Glucose-Capillary: 188 mg/dL — ABNORMAL HIGH (ref 65–99)

## 2015-04-09 LAB — BASIC METABOLIC PANEL
Anion gap: 3 — ABNORMAL LOW (ref 5–15)
BUN: 60 mg/dL — AB (ref 6–20)
CHLORIDE: 114 mmol/L — AB (ref 101–111)
CO2: 29 mmol/L (ref 22–32)
Calcium: 10 mg/dL (ref 8.9–10.3)
Creatinine, Ser: 1.87 mg/dL — ABNORMAL HIGH (ref 0.61–1.24)
GFR calc Af Amer: 43 mL/min — ABNORMAL LOW (ref >60)
GFR calc non Af Amer: 37 mL/min — ABNORMAL LOW (ref >60)
Glucose, Bld: 146 mg/dL — ABNORMAL HIGH (ref 65–99)
POTASSIUM: 3.9 mmol/L (ref 3.5–5.1)
SODIUM: 146 mmol/L — AB (ref 135–145)

## 2015-04-09 NOTE — Progress Notes (Signed)
Received paged from RN that the results of upper extremity duplex are in epic. Reviewed the results, which shows acute superficial thrombosis of the cephalic vein at the insertion of the IV site at the ante cubitus. 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 No evidence of DVT. Called the vascular surgeon on call Dr Scot Dock, and discussed the above results. He recommends no anticoagulation for the superficial thrombosis and repeat duplex ultrasound in one week to check for propagation of the thrombus.

## 2015-04-09 NOTE — Progress Notes (Signed)
Nutrition Follow-up  INTERVENTION:   Continue Glucerna 1.2 @ 45 ml/hr via PEG and increase by 10 ml every 4 hours to goal rate of 75 ml/hr.   Tube feeding regimen provides 2160 kcal (100% of needs), 108 grams of protein, and 1449 ml of H2O.   RD to continue to monitor  NUTRITION DIAGNOSIS:   Inadequate oral intake related to inability to eat, dysphagia as evidenced by other (see comment) (modified textured diet and need for enteral nutrition).  Ongoing.  GOAL:   Patient will meet greater than or equal to 90% of their needs  Not meeting yet.  MONITOR:   PO intake, Labs, Weight trends, TF tolerance, Skin, I & O's  ASSESSMENT:   59 y.o. year-old with hx of HTN, HL, DVT, DM2, PVD, GIB , hx fall/ SDH w R hemiparesis/ PEG/ bedridden/ lives in [place]. 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 Patient presented with sent from [facility] where he was nonverbal and recently dx'd with PNA.  Pt in room asleep with no family present. Per RN, pt ate 100% of his dinner last night but he has not had anything today d/t his risk of aspiration. SLP has evaluated the patient and recommends dysphagia 1 and thin liquids.  Glucerna 1.2 is currently running at 45 ml/hr.  Labs reviewed: CBGs: 141-184 Elevated Na, BUN & Creatinine  Diet Order:  DIET - DYS 1 Room service appropriate?: No; Fluid consistency:: Thin  Skin:  Wound (see comment) (penile ulcer)  Last BM:  10/6  Height:   Ht Readings from Last 1 Encounters:  04/05/15 6' (1.829 m)    Weight:   Wt Readings from Last 1 Encounters:  04/09/15 174 lb 13.2 oz (79.3 kg)    Ideal Body Weight:  80.9 kg  BMI:  Body mass index is 23.71 kg/(m^2).  Estimated Nutritional Needs:   Kcal:  2100-2300  Protein:  100-110g  Fluid:  2.2L/day  EDUCATION NEEDS:   No education needs identified at this time  Clayton Bibles, MS, RD, LDN Pager: [phone] After Hours Pager: [phone]

## 2015-04-09 NOTE — Progress Notes (Addendum)
Speech Language Pathology Treatment: Dysphagia  Patient Details Name: Justin [name] MRN: [id number] DOB: February 09, 1956 Today's Date: 04/09/2015 Time: 4975-3005 SLP Time Calculation (min) (ACUTE ONLY): 18 min  Assessment / Plan / Recommendation Clinical Impression  RN reports pt not heard coughing with intake when family fed pt yesterday.  SLP observed pt today noting that pt takes large boluses unless cup/straw pulled away from him.  Overt significant coughing episode noted immediately post=swallow of thin liquids via straw.  Cough resulted in appearance of mild distress with excessive breath holding and cough.  Unfortunately, pt also with cough from cup with thin.    He tolerated nectar thick liquid and pudding well but clearly indicated displeasure in thick juice.  Pharyngeal swallow strong per MBS but trace silent aspiration noted with thin.    To maximize pt safety and QOL, recommend continue thin liquids = via tsp only.  SlP to follow briefly to assure tolerance and determine readiness for restrictions to be lifted.  Thanks.    HPI Other Pertinent Information: 59 year old male admitted 04/05/15 due to confusion and unresponsiveness. PMH significant for ICH, large B-cell lymphoma. Orders received for BSE, which revealed no overt s/s aspiration of any consistency tested. However, pt with history of com/cog deficits and dysphagia, so MBS warranted.   Pertinent Vitals Pain Assessment: Faces Pain Score: 0-No pain  SLP Plan  Continue with current plan of care    Recommendations Diet recommendations: Dysphagia 1 (puree);Thin liquid Liquids provided via: Teaspoon Medication Administration: Via alternative means (RN reports medicine via PEG) Supervision: Full supervision/cueing for compensatory strategies Compensations: Slow rate;Small sips/bites;Follow solids with liquid Postural Changes and/or Swallow Maneuvers: Upright 30-60 min after meal;Seated upright 90 degrees              Oral Care  Recommendations: Oral care QID Follow up Recommendations:  (TBD) Plan: Continue with current plan of care    [place], [place]abella [facility] SLP [phone]   Of note, pt with frequent belching with intake but denied reflux = via head nod.  Recommend reflux precautions be followed.

## 2015-04-09 NOTE — Progress Notes (Signed)
VASCULAR LAB PRELIMINARY  PRELIMINARY  PRELIMINARY  PRELIMINARY  Right upper extremity venous duplex completed.    Preliminary report:  Right:  Acute superficial thrombosis of the cephalic vein at the insertion of the IV at the antecubitus.  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  No evidence of DVT.  Alanni Vader, RVT 04/09/2015, 3:42 PM

## 2015-04-09 NOTE — Progress Notes (Signed)
[facility] PROGRESS NOTE  Justin [name] UXN:[id number] DOB: 09/02/1955 DOA: 04/05/2015 PCP: Garwin Brothers, MD  Assessment/Plan: 1. Acute encephalopathy- likely from the acute respiratory failure, healthcare associated pneumonia. Patient admitted to step down unit and started on vancomycin and Ceftazidime. Blood cultures 2 have been negative so far. Patient is slowly improving. Will discontinue vancomycin at this time. 2. Sepsis due to UTI- patient presented with sepsis, urine culture is growing Escherichia coli. Continue IV Ceftazidime. 3. 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 Today creatinine is 1.87  4. Right upper extremity edema- will check ultrasound of the right upper extremity to rule out DVT. 5. Hypernatremia- sodium was 152, likely from dehydration, started on D5W at 50 ml/hr, today sodium is down to 146. 6. History of cadaveric  renal transplant- continue Prograf. 7. Penile/pubic ulcer- patient has large penile/pubic ulcer, no discharge noted. Discussed with patient's daughter who says it has been there for many years. He was seen by dermatology as well as urology as outpatient. Will keep the lesion clean and dry. Follow-up as outpatient. 8. Diabetes mellitus- continue sliding scale insulin with NovoLog. Continue Lantus 10 units daily. 9. Hypothermia- resolved likely from sepsis, patient required bear hugger blanket. 10. Hypotension- patient has been having intermittent episodes of hypotension, will hold antihypertensives medications at this time. 11. Nutrition- patient has PEG tube in place, patient started on Glucerna tube feedings as per nutrition. 12. 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 Patient underwent biopsy of the lesion after that he sustained intracranial hemorrhage resulting in right-sided weakness and dysphasia requiring tracheostomy and PEG tube placement. Patient has been at the nursing home facility. Patient's lymphoma was treated with whole brain radiation and was deemed not a candidate for chemotherapy. Patient was brought to the hospital for worsening confusion and hypoxia. He was found to be in sepsis due to UTI and possible pneumonia  This morning patient is more alert, tries to communicate. Has been started on tube feedings.  Objective: Filed Vitals:   04/09/15 0900  BP:   Pulse: 67  Temp: 97.9 F (36.6 C)  Resp: 0    Intake/Output Summary (Last 24 hours) at 04/09/15 1127 Last data filed at 04/09/15 0600  Gross per 24 hour  Intake 1096.92 ml  Output    865 ml  Net 231.92 ml   Filed Weights   04/05/15 1712 04/08/15 2100 04/09/15 0453  Weight: 81.874 kg (180 lb 8 oz) 79.9 kg (176 lb 2.4 oz) 79.3 kg (174 lb 13.2 oz)    Exam:   General:  Appears in no acute distress  Cardiovascular: S1-S2 is regular  Respiratory: Clear to auscultation bilaterally  Abdomen: Soft, nontender, no organomegaly  Musculoskeletal: Right upper extremity edema noted  Skin- fungating lesion, with chronic sloughing noted on the dorsal aspect of the penis and pubic area  Data Reviewed: Basic Metabolic Panel:  Recent Labs Lab 04/05/15 2244 04/06/15 0430 04/07/15 1020 04/08/15 1537 04/09/15 0340  NA 146* 147* 152* 147* 146*  K 5.1 4.7 4.6 4.1 3.9  CL 110 111 115* 113* 114*  CO2 28  31 30 32 29  GLUCOSE 322* 329* 221* 154* 146*  BUN 71* 66* 63* 65* 60*  CREATININE 2.28* 2.25*  2.05* 1.91* 1.87*  CALCIUM 10.0 9.8 10.6* 10.2 10.0  MG  --  2.4  --   --   --   PHOS  --  2.7  --   --   --    Liver Function Tests:  Recent Labs Lab 04/05/15 1650 04/06/15 0430 04/07/15 1020  AST 55* 39 21  ALT 42 37 27  ALKPHOS 71 75 76  BILITOT 0.9 0.4 0.8  PROT 8.1 7.3 8.0  ALBUMIN 2.8* 2.6* 2.7*   No results for input(s): LIPASE, AMYLASE in the last 168 hours. No results for input(s): AMMONIA in the last 168 hours. CBC:  Recent Labs Lab 04/05/15 1650 04/06/15 0043 04/06/15 0430 04/07/15 0830  WBC 9.0 9.7 8.3 7.6  NEUTROABS 7.0  --   --   --   HGB 14.6 13.0 13.0 14.8  HCT 49.9 45.2 45.0 49.8  MCV 95.2 96.0 93.9 92.4  PLT 174 162 151 174   Cardiac Enzymes:  Recent Labs Lab 04/05/15 2244 04/06/15 0430 04/06/15 1110  TROPONINI 0.23* 0.15* 0.09*   BNP (last 3 results) No results for input(s): BNP in the last 8760 hours.  ProBNP (last 3 results) No results for input(s): PROBNP in the last 8760 hours.  CBG:  Recent Labs Lab 04/08/15 1607 04/08/15 1957 04/08/15 2351 04/09/15 0437 04/09/15 0749  GLUCAP 139* 204* 184* 142* 141*    Recent Results (from the past 240 hour(s))  Blood Culture (routine x 2)     Status: None (Preliminary result)   Collection Time: 04/05/15  4:45 PM  Result Value Ref Range Status   Specimen Description BLOOD RIGHT AC  5 ML IN [facility] BOTTLE  Final   Special Requests NONE  Final   Culture   Final    NO GROWTH 3 DAYS Performed at [facility]    Report Status PENDING  Incomplete  Blood Culture (routine x 2)     Status: None (Preliminary result)   Collection Time: 04/05/15  4:50 PM  Result Value Ref Range Status   Specimen Description BLOOD RIGHT HAND  5 ML IN [facility] BOTTLE  Final   Special Requests NONE  Final   Culture   Final    NO GROWTH 3 DAYS Performed at [facility]    Report Status PENDING  Incomplete  Urine culture     Status: None   Collection Time: 04/05/15  5:00 PM  Result Value Ref Range  Status   Specimen Description URINE, CATHETERIZED  Final   Special Requests NONE  Final   Culture   Final    >=100,000 COLONIES/mL ESCHERICHIA COLI Performed at [facility]    Report Status 04/07/2015 FINAL  Final   Organism ID, Bacteria ESCHERICHIA COLI  Final      Susceptibility   Escherichia coli - MIC*    AMPICILLIN >=32 RESISTANT Resistant     CEFAZOLIN <=4 SENSITIVE Sensitive     CEFTRIAXONE <=1 SENSITIVE Sensitive     CIPROFLOXACIN >=4 RESISTANT Resistant     GENTAMICIN >=16 RESISTANT Resistant     IMIPENEM <=0.25 SENSITIVE Sensitive     NITROFURANTOIN <=16 SENSITIVE Sensitive     TRIMETH/SULFA <=20 SENSITIVE Sensitive     AMPICILLIN/SULBACTAM 16 INTERMEDIATE Intermediate     PIP/TAZO <=4 SENSITIVE Sensitive     * >=100,000 COLONIES/mL ESCHERICHIA COLI  MRSA PCR Screening  Status: None   Collection Time: 04/05/15  9:44 PM  Result Value Ref Range Status   MRSA by PCR NEGATIVE NEGATIVE Final    Comment:        The GeneXpert MRSA Assay (FDA approved for NASAL specimens only), is one component of a comprehensive MRSA colonization surveillance program. It is not intended to diagnose MRSA infection nor to guide or monitor treatment for MRSA infections.      Studies: Dg Chest Port 1 View  04/08/2015   CLINICAL DATA:  Respiratory failure.  EXAM: PORTABLE CHEST 1 VIEW  COMPARISON:  04/07/2015.  FINDINGS: Mediastinum and hilar structures are unremarkable. Heart size normal. Left lower lobe infiltrate consistent pneumonia. No pleural effusion or pneumothorax. Previously identified gastrostomy tube not definitely visualized. Stable calcific density left upper quadrant.  IMPRESSION: Left lower lobe infiltrate consistent with pneumonia.   Electronically Signed   By: Sutherlin   On: 04/08/2015 07:08   Dg Swallowing Func-speech Pathology  04/08/2015   Lorre Nick, [place]     04/08/2015  3:04 PM  Objective Swallowing Evaluation:  (MBS)  Patient Details  Name:  Justin [name] MRN: [id number] Date of Birth: 01/04/1956  Today's Date: 04/08/2015 Time: SLP Start Time (ACUTE ONLY): 1435-SLP Stop Time (ACUTE  ONLY): 1450 SLP Time Calculation (min) (ACUTE ONLY): 15 min  Past Medical History:  Past Medical History  Diagnosis Date  . Hypertension   . Dyslipidemia   . Carpal tunnel syndrome   . A-V fistula ([place])   . Anemia   . Hemorrhoids   . Hyperparathyroidism   . Intracranial hemorrhage ([place])   . Hypotension   . Hyperkalemia   . Kidney disease   . Sepsis(995.91)   . GI bleeding   . DVT (deep venous thrombosis) ([place])   . PVD (peripheral vascular disease) ([place])   . DM (diabetes mellitus) ([place])   . Cellulitis   . IV infiltration     Right upper extremity  . PPD positive   . 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 Hypokinesis     EF of 60% with mild anterior hypokinesis, minimal coronary  artery disease on catheterization in December of 2007  . Gout   . Renal transplant, status post 10/19/2014   Past Surgical History:  Past Surgical History  Procedure Laterality Date  . Kidney transplant      History of renal transplant maintained on chronic  immunosuppressive therapy  . Av fistula placement      Right arm  . Peripherally inserted central catheter insertion      line placement  . Amputation    . Gastrostomy    . 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 Brain biopsy    . #6 shiley cuff      Less trache   HPI:  Other Pertinent Information: 59 year old male admitted 04/05/15  due to confusion and unresponsiveness. PMH significant for ICH,  large B-cell lymphoma. Orders received for BSE, which revealed no  overt s/s aspiration of any consistency tested. However, pt with  history of com/cog deficits and dysphagia, so MBS warranted.  No Data Recorded  Assessment / Plan / Recommendation CHL IP CLINICAL IMPRESSIONS 04/08/2015  Therapy Diagnosis Moderate oral phase dysphagia;Mild pharyngeal  phase dysphagia  Clinical Impression Pt presents  with moderate oral and mild  pharyngeal sensory and motor based dysphagia, characterized as  follows: Orally, pt  exhibits adequate manipulation of thin and  puree consistencies, however, was unable to masticate solid or  propel barium tablet posteriorly. Both solid and pill were  removed from pt oral cavity. Pharyngeally, Pt demonstrated  delayed swallow reflex on puree and thin consistencies, with  trigger at the level of the vallecular sinus. Puree consistency  was tolerated without penetration, aspiration, or post-swallow  residue. Thin liquid via straw was tolerated without penetration,  aspiration or post-swallow residue when bolus size and rate were  limited to small sips one at a time. Trace silent aspiration did  occur during the swallow of large consecutive boluses. Recommend  puree diet and thin liquids via straw, limiting bolus size and  rate to 1 small sip at a time. Crushed meds, alternating solids  and liquids, and regular thorough oral care are also recommended.   ST to follow for diet tolerance.      CHL IP TREATMENT RECOMMENDATION 04/08/2015  Treatment Recommendations Therapy as outlined in treatment plan  below     CHL IP DIET RECOMMENDATION 04/08/2015  SLP Diet Recommendations Dysphagia 1 (Puree);Thin  Liquid Administration via (None)  Medication Administration Crushed with puree  Compensations Small sips/bites;Slow rate;Follow solids with  liquid  Postural Changes and/or Swallow Maneuvers (None)     CHL IP OTHER RECOMMENDATIONS 04/08/2015  Recommended Consults (None) Oral Care Recommendations Oral care  BID;Staff/trained caregiver to provide oral care  Other Recommendations Have oral suction available     CHL IP FOLLOW UP RECOMMENDATIONS 03/04/2015  Follow up Recommendations Skilled Nursing facility     [facility] IP FREQUENCY AND DURATION 04/08/2015  Speech Therapy Frequency (ACUTE ONLY) min 1 x/week  Treatment Duration 1 week     Pertinent Vitals/Pain VSS, no pain evident    SLP Swallow Goals No flowsheet  data found.  No flowsheet data found.    CHL IP REASON FOR REFERRAL 04/08/2015  Reason for Referral Objectively evaluate swallowing function     CHL IP ORAL PHASE 04/08/2015  Lips (None)  Tongue (None)  Mucous membranes (None) Nutritional status (None)  Other (None)  Oxygen therapy (None)  Oral Phase Impaired  Oral - Pudding Teaspoon (None)  Oral - Pudding Cup (None)  Oral - Honey Teaspoon (None)  Oral - Honey Cup (None)  Oral - Honey Syringe (None)  Oral - Nectar Teaspoon (None)  Oral - Nectar Cup (None)  Oral - Nectar Straw (None)  Oral - Nectar Syringe (None)  Oral - Ice Chips (None)  Oral - Thin Teaspoon (None)  Oral - Thin Cup (None)  Oral - Thin Straw (None)  Oral - Thin Syringe (None)  Oral - Puree (None)  Oral - Mechanical Soft (None)  Oral - Regular (None)  Oral - Multi-consistency (None)  Oral - Pill (None)  Oral Phase - Comment (None)      CHL IP PHARYNGEAL PHASE 04/08/2015  Pharyngeal Phase Impaired  Pharyngeal - Pudding Teaspoon (None)  Penetration/Aspiration details (pudding teaspoon) (None)  Pharyngeal - Pudding Cup (None)  Penetration/Aspiration details (pudding cup) (None)  Pharyngeal - Honey Teaspoon (None)  Penetration/Aspiration details (honey teaspoon) (None)  Pharyngeal - Honey Cup (None)  Penetration/Aspiration details (honey cup) (None)  Pharyngeal - Honey Syringe (None)  Penetration/Aspiration details (honey syringe) (None)  Pharyngeal - Nectar Teaspoon (None)  Penetration/Aspiration details (nectar teaspoon) (None)  Pharyngeal - Nectar Cup (None)  Penetration/Aspiration details (nectar cup) (None)  Pharyngeal - Nectar Straw (None) Penetration/Aspiration details  (nectar straw) (None)  Pharyngeal - Nectar Syringe (None)  Penetration/Aspiration details (  nectar syringe) (None)  Pharyngeal - Ice Chips (None)  Penetration/Aspiration details (ice chips) (None)  Pharyngeal - Thin Teaspoon (None)  Penetration/Aspiration details (thin teaspoon) (None)  Pharyngeal - Thin Cup (None)   Penetration/Aspiration details (thin cup) (None)  Pharyngeal - Thin Straw (None)  Penetration/Aspiration details (thin straw) (None)  Pharyngeal - Thin Syringe (None)  Penetration/Aspiration details (thin syringe') (None)  Pharyngeal - Puree (None)  Penetration/Aspiration details (puree) (None)  Pharyngeal - Mechanical Soft (None)  Penetration/Aspiration details (mechanical soft) (None)  Pharyngeal - Regular (None)  Penetration/Aspiration details (regular) (None)  Pharyngeal - Multi-consistency (None)  Penetration/Aspiration details (multi-consistency) (None)  Pharyngeal - Pill (None)  Penetration/Aspiration details (pill) (None)  Pharyngeal Comment (None)      CHL IP CERVICAL ESOPHAGEAL PHASE 04/08/2015  Cervical Esophageal Phase WFL  Pudding Teaspoon (None)  Pudding Cup (None)  Honey Teaspoon (None)  Honey Cup (None)  Honey Straw (None)  Nectar Teaspoon (None)  Nectar Cup (None)  Nectar Straw (None)  Nectar Sippy Cup (None)  Thin Teaspoon (None)  Thin Cup (None)  Thin Straw (None)  Thin Sippy Cup (None)  Cervical Esophageal Comment (None)         [place], Fredirick Maudlin 04/08/2015, 3:03 PM  Celia B. Quentin Ore [facility], CCC-SLP [phone]   Scheduled Meds: . antiseptic oral rinse  7 mL Mouth Rinse q12n4p  . aspirin  81 mg Oral Daily  . cefTAZidime (FORTAZ)  IV  1 g Intravenous Q12H  . chlorhexidine  15 mL Mouth Rinse BID  . enoxaparin (LOVENOX) injection  40 mg Subcutaneous QHS  . feeding supplement (GLUCERNA 1.2 CAL)  1,000 mL Per Tube Q24H  . fentaNYL  25 mcg Transdermal Q72H  . gabapentin  300 mg Oral TID  . Influenza vac split quadrivalent PF  0.5 mL Intramuscular Tomorrow-1000  . insulin aspart  0-15 Units Subcutaneous Q4H  . insulin glargine  10 Units Subcutaneous QHS  . lamoTRIgine  50 mg Per Tube Daily  . predniSONE  5 mg Per Tube Daily  . sodium chloride  3 mL Intravenous Q12H  . sucralfate  1 g Per Tube 3 times per day  . tacrolimus  0.5 mg Oral BID  . tacrolimus  3 mg Oral BID  .  vancomycin  1,250 mg Intravenous Q24H   Continuous Infusions: . sodium chloride 1,000 mL (04/08/15 2101)    Active Problems:   Diabetes mellitus ([place])   Essential hypertension   UTI (lower urinary tract infection)   Sepsis ([place])   Acute renal failure superimposed on stage 3 chronic kidney disease (HCC)   Pneumonia   S/p cadaver renal transplant   Acute respiratory failure with hypoxia and hypercarbia (HCC)   Chronic systolic CHF (congestive heart failure) (HCC)   ESRD (end stage renal disease) ([place])   Penile ulcer   Acute kidney injury superimposed on CKD ([place])    Time spent: *25 min    [facility]  [facility] Pager [phone]*. If 7PM-7AM, please contact night-coverage at [email or web address], password [facility] 04/09/2015, 11:27 AM  LOS: 4 days

## 2015-04-09 NOTE — Progress Notes (Signed)
Date:  Oct. 06, 2016 U.R. performed for needs and level of care. Will continue to follow for Case Management needs.  Velva Harman, RN, BSN, [place]   [phone]

## 2015-04-09 NOTE — Progress Notes (Signed)
ANTIBIOTIC CONSULT NOTE - FOLLOW UP  Pharmacy Consult for Vancomycin and Ceftazidime Indication: rule out sepsis, pneumonia  Allergies  Allergen Reactions  . Amoxicillin Other (See Comments)    ON MAR  . Ampicillin Other (See Comments)    ON MAR  . Latex Other (See Comments)    ON MAR  . Morphine And Related Other (See Comments)    UNK  . Penicillins Other (See Comments)    ON MAR; tolerated Ceftriaxone 8/27-9/1  . Tape Other (See Comments)    ON MAR    Patient Measurements: Height: 6' (182.9 cm) Weight: 174 lb 13.2 oz (79.3 kg) IBW/kg (Calculated) : 77.6  Vital Signs: Temp: 97.3 F (36.3 C) (10/06 0600) BP: 116/58 mmHg (10/06 0400) Pulse Rate: 60 (10/06 0600) Intake/Output from previous day: 10/05 0701 - 10/06 0700 In: 1146.9 [I.V.:449.2; NG/GT:347.8; IV Piggyback:350] Out: 865 [Urine:865] Intake/Output from this shift:    Labs:  Recent Labs  04/07/15 0830 04/07/15 1020 04/08/15 1537 04/09/15 0340  WBC 7.6  --   --   --   HGB 14.8  --   --   --   PLT 174  --   --   --   CREATININE  --  2.05* 1.91* 1.87*   Estimated Creatinine Clearance: 45.5 mL/min (by C-G formula based on Cr of 1.87). No results for input(s): VANCOTROUGH, VANCOPEAK, VANCORANDOM, GENTTROUGH, GENTPEAK, GENTRANDOM, TOBRATROUGH, TOBRAPEAK, TOBRARND, AMIKACINPEAK, AMIKACINTROU, AMIKACIN in the last 72 hours.   Microbiology: Recent Results (from the past 720 hour(s))  Blood Culture (routine x 2)     Status: None (Preliminary result)   Collection Time: 04/05/15  4:45 PM  Result Value Ref Range Status   Specimen Description BLOOD RIGHT AC  5 ML IN [facility] BOTTLE  Final   Special Requests NONE  Final   Culture   Final    NO GROWTH 3 DAYS Performed at [facility]    Report Status PENDING  Incomplete  Blood Culture (routine x 2)     Status: None (Preliminary result)   Collection Time: 04/05/15  4:50 PM  Result Value Ref Range Status   Specimen Description BLOOD RIGHT HAND  5 ML IN [facility]  BOTTLE  Final   Special Requests NONE  Final   Culture   Final    NO GROWTH 3 DAYS Performed at [facility]    Report Status PENDING  Incomplete  Urine culture     Status: None   Collection Time: 04/05/15  5:00 PM  Result Value Ref Range Status   Specimen Description URINE, CATHETERIZED  Final   Special Requests NONE  Final   Culture   Final    >=100,000 COLONIES/mL ESCHERICHIA COLI Performed at [facility]    Report Status 04/07/2015 FINAL  Final   Organism ID, Bacteria ESCHERICHIA COLI  Final      Susceptibility   Escherichia coli - MIC*    AMPICILLIN >=32 RESISTANT Resistant     CEFAZOLIN <=4 SENSITIVE Sensitive     CEFTRIAXONE <=1 SENSITIVE Sensitive     CIPROFLOXACIN >=4 RESISTANT Resistant     GENTAMICIN >=16 RESISTANT Resistant     IMIPENEM <=0.25 SENSITIVE Sensitive     NITROFURANTOIN <=16 SENSITIVE Sensitive     TRIMETH/SULFA <=20 SENSITIVE Sensitive     AMPICILLIN/SULBACTAM 16 INTERMEDIATE Intermediate     PIP/TAZO <=4 SENSITIVE Sensitive     * >=100,000 COLONIES/mL ESCHERICHIA COLI  MRSA PCR Screening     Status: None  Collection Time: 04/05/15  9:44 PM  Result Value Ref Range Status   MRSA by PCR NEGATIVE NEGATIVE Final    Comment:        The GeneXpert MRSA Assay (FDA approved for NASAL specimens only), is one component of a comprehensive MRSA colonization surveillance program. It is not intended to diagnose MRSA infection nor to guide or monitor treatment for MRSA infections.     Anti-infectives    Start     Dose/Rate Route Frequency Ordered Stop   04/06/15 1700  vancomycin (VANCOCIN) IVPB 1000 mg/200 mL premix  Status:  Discontinued     1,000 mg 200 mL/hr over 60 Minutes Intravenous Every 24 hours 04/05/15 1837 04/06/15 0713   04/06/15 1400  vancomycin (VANCOCIN) 1,250 mg in sodium chloride 0.9 % 250 mL IVPB     1,250 mg 166.7 mL/hr over 90 Minutes Intravenous Every 24 hours 04/06/15 0713     04/06/15 1000  cefTAZidime (FORTAZ) 1  g in dextrose 5 % 50 mL IVPB     1 g 100 mL/hr over 30 Minutes Intravenous Every 12 hours 04/06/15 0713     04/05/15 1830  cefTAZidime (FORTAZ) 1 g in dextrose 5 % 50 mL IVPB  Status:  Discontinued     1 g 100 mL/hr over 30 Minutes Intravenous Every 24 hours 04/05/15 1826 04/06/15 0713   04/05/15 1615  piperacillin-tazobactam (ZOSYN) IVPB 3.375 g     3.375 g 100 mL/hr over 30 Minutes Intravenous  Once 04/05/15 1614 04/05/15 1826   04/05/15 1615  vancomycin (VANCOCIN) IVPB 1000 mg/200 mL premix     1,000 mg 200 mL/hr over 60 Minutes Intravenous  Once 04/05/15 1614 04/05/15 1808      Justin [name] diagnosed with sepsis. Pt has a history of renal transplant on tacrolimus and is noncommunicative intracranial bleeding. Pt also has a ulcer/mass on penis with weeping and odor. Recent admission for sepsis/UTI/PNA.  10/2 Zosyn x 1 10/2 >> Ceftazidime >> 10/2 >> Vancomycin >>  Afebrile WBC WNL SCr improved to 1.87, CrCl 42 ml/min/1.49m2 (normalized)  10/2 blood x 2: ngtd 10/2 urine: >100K E.Coli (S ancef/ceftriaxone/imip/nitro/zosyn/bactrim; R amp/cipro/gent; I unasyn)  Goal of Therapy:  Vancomycin trough level 15-20 mcg/ml  Ceftazidime dose per renal function  Plan:  Day #5 antibiotics  Continue ceftazidime 1g IV q12h  Continue vancomycin to 1250mg  IV q24h (although recommend d/c since no gram positive organisms isolated) Check steady state trough today at 13:00  Follow up renal function & cultures, transition to PO abx when appropriate  Peggyann Juba, PharmD, BCPS Pager: [phone] 04/09/2015,7:08 AM

## 2015-04-10 ENCOUNTER — Inpatient Hospital Stay (HOSPITAL_COMMUNITY): Payer: Medicare Other

## 2015-04-10 DIAGNOSIS — I10 Essential (primary) hypertension: Secondary | ICD-10-CM

## 2015-04-10 LAB — CBC
HEMATOCRIT: 41.7 % (ref 39.0–52.0)
Hemoglobin: 12.9 g/dL — ABNORMAL LOW (ref 13.0–17.0)
MCH: 28.2 pg (ref 26.0–34.0)
MCHC: 30.9 g/dL (ref 30.0–36.0)
MCV: 91 fL (ref 78.0–100.0)
PLATELETS: 172 10*3/uL (ref 150–400)
RBC: 4.58 MIL/uL (ref 4.22–5.81)
RDW: 15 % (ref 11.5–15.5)
WBC: 4.6 10*3/uL (ref 4.0–10.5)

## 2015-04-10 LAB — CULTURE, BLOOD (ROUTINE X 2)
CULTURE: NO GROWTH
Culture: NO GROWTH

## 2015-04-10 LAB — COMPREHENSIVE METABOLIC PANEL
ALT: 21 U/L (ref 17–63)
AST: 24 U/L (ref 15–41)
Albumin: 2.3 g/dL — ABNORMAL LOW (ref 3.5–5.0)
Alkaline Phosphatase: 77 U/L (ref 38–126)
Anion gap: 6 (ref 5–15)
BILIRUBIN TOTAL: 0.4 mg/dL (ref 0.3–1.2)
BUN: 46 mg/dL — ABNORMAL HIGH (ref 6–20)
CHLORIDE: 113 mmol/L — AB (ref 101–111)
CO2: 26 mmol/L (ref 22–32)
CREATININE: 1.54 mg/dL — AB (ref 0.61–1.24)
Calcium: 9.9 mg/dL (ref 8.9–10.3)
GFR, EST AFRICAN AMERICAN: 54 mL/min — AB (ref >60)
GFR, EST NON AFRICAN AMERICAN: 47 mL/min — AB (ref >60)
Glucose, Bld: 235 mg/dL — ABNORMAL HIGH (ref 65–99)
POTASSIUM: 4.5 mmol/L (ref 3.5–5.1)
Sodium: 145 mmol/L (ref 135–145)
TOTAL PROTEIN: 6.2 g/dL — AB (ref 6.5–8.1)

## 2015-04-10 LAB — GLUCOSE, CAPILLARY
GLUCOSE-CAPILLARY: 219 mg/dL — AB (ref 65–99)
GLUCOSE-CAPILLARY: 230 mg/dL — AB (ref 65–99)
GLUCOSE-CAPILLARY: 218 mg/dL — AB (ref 65–99)
Glucose-Capillary: 222 mg/dL — ABNORMAL HIGH (ref 65–99)

## 2015-04-10 MED ORDER — DEXTROSE 5 % IV SOLN
1.0000 g | Freq: Three times a day (TID) | INTRAVENOUS | Status: DC
  Administered 2015-04-10: 1 g via INTRAVENOUS
  Filled 2015-04-10: qty 1

## 2015-04-10 MED ORDER — LORAZEPAM 0.5 MG PO TABS: 0.5000 mg | ORAL_TABLET | Freq: Three times a day (TID) | ORAL | Status: DC | PRN

## 2015-04-10 MED ORDER — SULFAMETHOXAZOLE-TRIMETHOPRIM 800-160 MG PO TABS: 1.0000 | ORAL_TABLET | Freq: Two times a day (BID) | ORAL | Status: AC

## 2015-04-10 MED ORDER — HYDRALAZINE HCL 25 MG PO TABS
25.0000 mg | ORAL_TABLET | Freq: Two times a day (BID) | ORAL | Status: DC
Start: 2015-04-10 — End: 2015-04-10
  Administered 2015-04-10: 25 mg via ORAL
  Filled 2015-04-10: qty 1

## 2015-04-10 MED ORDER — OXYCODONE-ACETAMINOPHEN 7.5-325 MG PO TABS: 1.0000 | ORAL_TABLET | ORAL | Status: DC | PRN

## 2015-04-10 MED ORDER — LEVETIRACETAM 100 MG/ML PO SOLN: 500.0000 mg | Freq: Two times a day (BID) | ORAL | Status: DC

## 2015-04-10 MED ORDER — AMLODIPINE BESYLATE 5 MG PO TABS
5.0000 mg | ORAL_TABLET | Freq: Every day | ORAL | Status: DC
  Administered 2015-04-10: 5 mg via ORAL
  Filled 2015-04-10: qty 1

## 2015-04-10 MED ORDER — CITALOPRAM HYDROBROMIDE 20 MG PO TABS
20.0000 mg | ORAL_TABLET | Freq: Every day | ORAL | Status: DC
  Administered 2015-04-10: 20 mg via ORAL
  Filled 2015-04-10: qty 1

## 2015-04-10 MED ORDER — AMLODIPINE BESYLATE 5 MG PO TABS: 5.0000 mg | ORAL_TABLET | Freq: Every day | ORAL | Status: DC

## 2015-04-10 MED ORDER — LEVETIRACETAM 500 MG PO TABS
500.0000 mg | ORAL_TABLET | Freq: Two times a day (BID) | ORAL | Status: DC
  Administered 2015-04-10: 500 mg via ORAL
  Filled 2015-04-10: qty 1

## 2015-04-10 MED ORDER — ISOSORBIDE DINITRATE 10 MG PO TABS
10.0000 mg | ORAL_TABLET | Freq: Two times a day (BID) | ORAL | Status: DC
  Administered 2015-04-10: 10 mg via ORAL
  Filled 2015-04-10 (×2): qty 1

## 2015-04-10 NOTE — Progress Notes (Signed)
[facility] PROGRESS NOTE  Justin [name] AST:[id number] DOB: 03/16/1956 DOA: 04/05/2015 PCP: Garwin Brothers, MD  Assessment/Plan: 1. Acute encephalopathy- likely from the acute respiratory failure, healthcare associated pneumonia. Patient admitted to step down unit and started on vancomycin and Ceftazidime. Blood cultures 2 have been negative so far. Patient is slowly improving. Vancomycin was discontinued. 2. Sepsis due to UTI-  Improving, patient presented with sepsis, urine culture is growing Escherichia coli. Continue IV Ceftazidime. 3. 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 Today creatinine is 1.54 4. Right upper extremity edema-  ultrasound of the right upper extremity showed acute superficial thrombosis of the cephalic vein at the insertion of the IV site at the ante cubitus. 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 No evidence of DVT. Called the vascular surgeon on call Dr Scot Dock, and discussed the above results. He recommends no anticoagulation for the superficial thrombosis and repeat duplex ultrasound in one week to check for propagation of the thrombus to rule out DVT. 5. Hypernatremia- sodium was 152, likely from dehydration, started on D5W at 50 ml/hr, today sodium is down to 143. IV fluids has been changed to normal saline at 50 ml/hr. 6. History of cadaveric  renal transplant- continue Prograf. 7. Penile/pubic ulcer- patient has large penile/pubic ulcer, no discharge noted. Discussed with patient's daughter who says it has been there for many years. He was seen by dermatology as well as urology as outpatient. Will keep the lesion clean and dry. Follow-up as outpatient. 8. Diabetes mellitus- continue sliding scale insulin with NovoLog. Continue Lantus 10 units daily. 9. Hypothermia- resolved likely from sepsis, patient required bear hugger blanket. 10. Hypotension- resolved, patient was  having intermittent  episodes of hypotension, will hold antihypertensives medications  were held as the BP has is now elevated, will start scheduled Hydralazine 25 mg po BID. 11. Nutrition- patient has PEG tube in place, patient started on Glucerna tube feedings as per nutrition. 12. DVT prophylaxis- Lovenox  Code Status: Full code Family Communication: *Tried to call daughter but unable to contact her. 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 Patient underwent biopsy of the lesion after that he sustained intracranial hemorrhage resulting in right-sided weakness and dysphasia requiring tracheostomy and PEG tube placement. Patient has been at the nursing home facility. Patient's lymphoma was treated with whole brain radiation and was deemed not a candidate for chemotherapy. Patient was brought to the hospital for worsening confusion and hypoxia. He was found to be in sepsis due to UTI and possible pneumonia  This morning patient is more alert and responds to questions but speech not comprehensible.  Objective: Filed Vitals:   04/10/15 0800  BP: 169/72  Pulse: 79  Temp: 98.6 F (37 C)  Resp: 16    Intake/Output Summary (Last 24 hours) at 04/10/15 0923 Last data filed at 04/10/15 0800  Gross per 24 hour  Intake   2584 ml  Output   1475 ml  Net   1109 ml   Filed Weights   04/08/15 2100 04/09/15 0453 04/10/15 0500  Weight: 79.9 kg (176 lb 2.4 oz) 79.3 kg (174 lb 13.2 oz) 80.8 kg (178 lb 2.1 oz)    Exam:  General:  Appears in no acute distress  Cardiovascular: S1-S2 is regular  Respiratory: Clear to auscultation bilaterally  Abdomen: Soft, nontender, no organomegaly  Musculoskeletal: Right upper  extremity edema noted  Skin- fungating lesion, with chronic sloughing noted on the dorsal aspect of the penis and pubic area  Data Reviewed: Basic Metabolic Panel:  Recent Labs Lab 04/06/15 0430 04/07/15 1020 04/08/15 1537 04/09/15 0340 04/10/15 0350  NA 147* 152* 147* 146* 145  K 4.7 4.6 4.1 3.9 4.5  CL 111 115* 113* 114* 113*  CO2 31 30 32 29 26  GLUCOSE 329* 221* 154* 146* 235*  BUN 66* 63* 65* 60* 46*  CREATININE 2.25* 2.05* 1.91* 1.87* 1.54*  CALCIUM 9.8 10.6* 10.2 10.0 9.9  MG 2.4  --   --   --   --   PHOS 2.7  --   --   --   --    Liver Function Tests:  Recent Labs Lab 04/05/15 1650 04/06/15 0430 04/07/15 1020 04/10/15 0350  AST 55* 39 21 24  ALT 42 37 27 21  ALKPHOS 71 75 76 77  BILITOT 0.9 0.4 0.8 0.4  PROT 8.1 7.3 8.0 6.2*  ALBUMIN 2.8* 2.6* 2.7* 2.3*   No results for input(s): LIPASE, AMYLASE in the last 168 hours. No results for input(s): AMMONIA in the last 168 hours. CBC:  Recent Labs Lab 04/05/15 1650 04/06/15 0043 04/06/15 0430 04/07/15 0830 04/10/15 0350  WBC 9.0 9.7 8.3 7.6 4.6  NEUTROABS 7.0  --   --   --   --   HGB 14.6 13.0 13.0 14.8 12.9*  HCT 49.9 45.2 45.0 49.8 41.7  MCV 95.2 96.0 93.9 92.4 91.0  PLT 174 162 151 174 172   Cardiac Enzymes:  Recent Labs Lab 04/05/15 2244 04/06/15 0430 04/06/15 1110  TROPONINI 0.23* 0.15* 0.09*   BNP (last 3 results) No results for input(s): BNP in the last 8760 hours.  ProBNP (last 3 results) No results for input(s): PROBNP in the last 8760 hours.  CBG:  Recent Labs Lab 04/09/15 1227 04/09/15 1603 04/09/15 1932 04/09/15 2256 04/10/15 0359  GLUCAP 196* 185* 188* 219* 230*    Recent Results (from the past 240 hour(s))  Blood Culture (routine x 2)     Status: None (Preliminary result)   Collection Time: 04/05/15  4:45 PM  Result Value Ref Range Status   Specimen Description BLOOD RIGHT AC  5 ML IN [facility] BOTTLE  Final   Special Requests NONE  Final   Culture   Final    NO  GROWTH 4 DAYS Performed at [facility]    Report Status PENDING  Incomplete  Blood Culture (routine x 2)     Status: None (Preliminary result)   Collection Time: 04/05/15  4:50 PM  Result Value Ref Range Status   Specimen Description BLOOD RIGHT HAND  5 ML IN [facility] BOTTLE  Final   Special Requests NONE  Final   Culture   Final    NO GROWTH 4 DAYS Performed at [facility]    Report Status PENDING  Incomplete  Urine culture     Status: None   Collection Time: 04/05/15  5:00 PM  Result Value Ref Range Status   Specimen Description URINE, CATHETERIZED  Final   Special Requests NONE  Final   Culture   Final    >=100,000 COLONIES/mL ESCHERICHIA COLI Performed at [facility]    Report Status 04/07/2015 FINAL  Final  Organism ID, Bacteria ESCHERICHIA COLI  Final      Susceptibility   Escherichia coli - MIC*    AMPICILLIN >=32 RESISTANT Resistant     CEFAZOLIN <=4 SENSITIVE Sensitive     CEFTRIAXONE <=1 SENSITIVE Sensitive     CIPROFLOXACIN >=4 RESISTANT Resistant     GENTAMICIN >=16 RESISTANT Resistant     IMIPENEM <=0.25 SENSITIVE Sensitive     NITROFURANTOIN <=16 SENSITIVE Sensitive     TRIMETH/SULFA <=20 SENSITIVE Sensitive     AMPICILLIN/SULBACTAM 16 INTERMEDIATE Intermediate     PIP/TAZO <=4 SENSITIVE Sensitive     * >=100,000 COLONIES/mL ESCHERICHIA COLI  MRSA PCR Screening     Status: None   Collection Time: 04/05/15  9:44 PM  Result Value Ref Range Status   MRSA by PCR NEGATIVE NEGATIVE Final    Comment:        The GeneXpert MRSA Assay (FDA approved for NASAL specimens only), is one component of a comprehensive MRSA colonization surveillance program. It is not intended to diagnose MRSA infection nor to guide or monitor treatment for MRSA infections.      Studies: Dg Swallowing Func-speech Pathology  04/08/2015   Lorre Nick, [place]     04/08/2015  3:04 PM  Objective Swallowing Evaluation:  (MBS)  Patient Details  Name: Justin [name] MRN: [id number] Date of Birth: Mar 31, 1956  Today's Date: 04/08/2015 Time: SLP Start Time (ACUTE ONLY): 1435-SLP Stop Time (ACUTE  ONLY): 1450 SLP Time Calculation (min) (ACUTE ONLY): 15 min  Past Medical History:  Past Medical History  Diagnosis Date  . Hypertension   . Dyslipidemia   . Carpal tunnel syndrome   . A-V fistula ([place])   . Anemia   . Hemorrhoids   . Hyperparathyroidism   . Intracranial hemorrhage ([place])   . Hypotension   . Hyperkalemia   . Kidney disease   . Sepsis(995.91)   . GI bleeding   . DVT (deep venous thrombosis) ([place])   . PVD (peripheral vascular disease) ([place])   . DM (diabetes mellitus) ([place])   . Cellulitis   . IV infiltration     Right upper extremity  . PPD positive   . 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 Hypokinesis     EF of 60% with mild anterior hypokinesis, minimal coronary  artery disease on catheterization in December of 2007  . Gout   . Renal transplant, status post 10/19/2014   Past Surgical History:  Past Surgical History  Procedure Laterality Date  . Kidney transplant      History of renal transplant maintained on chronic  immunosuppressive therapy  . Av fistula placement      Right arm  . Peripherally inserted central catheter insertion      line placement  . Amputation    . Gastrostomy    . 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 Brain biopsy    . #6 shiley cuff      Less trache   HPI:  Other Pertinent Information: 60 year old male admitted 04/05/15  due to confusion and unresponsiveness. PMH significant for ICH,  large B-cell lymphoma. Orders received for BSE, which revealed no  overt s/s aspiration of any consistency tested. However, pt with  history of com/cog deficits and dysphagia, so MBS warranted.  No Data Recorded  Assessment / Plan / Recommendation CHL IP CLINICAL IMPRESSIONS 04/08/2015  Therapy Diagnosis Moderate oral  phase dysphagia;Mild pharyngeal  phase dysphagia  Clinical Impression Pt presents with  moderate oral and mild  pharyngeal sensory and motor based dysphagia, characterized as  follows: Orally, pt exhibits adequate manipulation of thin and  puree consistencies, however, was unable to masticate solid or  propel barium tablet posteriorly. Both solid and pill were  removed from pt oral cavity. Pharyngeally, Pt demonstrated  delayed swallow reflex on puree and thin consistencies, with  trigger at the level of the vallecular sinus. Puree consistency  was tolerated without penetration, aspiration, or post-swallow  residue. Thin liquid via straw was tolerated without penetration,  aspiration or post-swallow residue when bolus size and rate were  limited to small sips one at a time. Trace silent aspiration did  occur during the swallow of large consecutive boluses. Recommend  puree diet and thin liquids via straw, limiting bolus size and  rate to 1 small sip at a time. Crushed meds, alternating solids  and liquids, and regular thorough oral care are also recommended.   ST to follow for diet tolerance.      CHL IP TREATMENT RECOMMENDATION 04/08/2015  Treatment Recommendations Therapy as outlined in treatment plan  below     CHL IP DIET RECOMMENDATION 04/08/2015  SLP Diet Recommendations Dysphagia 1 (Puree);Thin  Liquid Administration via (None)  Medication Administration Crushed with puree  Compensations Small sips/bites;Slow rate;Follow solids with  liquid  Postural Changes and/or Swallow Maneuvers (None)     CHL IP OTHER RECOMMENDATIONS 04/08/2015  Recommended Consults (None) Oral Care Recommendations Oral care  BID;Staff/trained caregiver to provide oral care  Other Recommendations Have oral suction available     CHL IP FOLLOW UP RECOMMENDATIONS 03/04/2015  Follow up Recommendations Skilled Nursing facility     [facility] IP FREQUENCY AND DURATION 04/08/2015  Speech Therapy Frequency (ACUTE ONLY) min 1 x/week  Treatment Duration 1 week     Pertinent Vitals/Pain VSS, no pain evident    SLP Swallow Goals No flowsheet data  found.  No flowsheet data found.    CHL IP REASON FOR REFERRAL 04/08/2015  Reason for Referral Objectively evaluate swallowing function     CHL IP ORAL PHASE 04/08/2015  Lips (None)  Tongue (None)  Mucous membranes (None) Nutritional status (None)  Other (None)  Oxygen therapy (None)  Oral Phase Impaired  Oral - Pudding Teaspoon (None)  Oral - Pudding Cup (None)  Oral - Honey Teaspoon (None)  Oral - Honey Cup (None)  Oral - Honey Syringe (None)  Oral - Nectar Teaspoon (None)  Oral - Nectar Cup (None)  Oral - Nectar Straw (None)  Oral - Nectar Syringe (None)  Oral - Ice Chips (None)  Oral - Thin Teaspoon (None)  Oral - Thin Cup (None)  Oral - Thin Straw (None)  Oral - Thin Syringe (None)  Oral - Puree (None)  Oral - Mechanical Soft (None)  Oral - Regular (None)  Oral - Multi-consistency (None)  Oral - Pill (None)  Oral Phase - Comment (None)      CHL IP PHARYNGEAL PHASE 04/08/2015  Pharyngeal Phase Impaired  Pharyngeal - Pudding Teaspoon (None)  Penetration/Aspiration details (pudding teaspoon) (None)  Pharyngeal - Pudding Cup (None)  Penetration/Aspiration details (pudding cup) (None)  Pharyngeal - Honey Teaspoon (None)  Penetration/Aspiration details (honey teaspoon) (None)  Pharyngeal - Honey Cup (None)  Penetration/Aspiration details (honey cup) (None)  Pharyngeal - Honey Syringe (None)  Penetration/Aspiration details (honey syringe) (None)  Pharyngeal - Nectar Teaspoon (None)  Penetration/Aspiration details (nectar teaspoon) (None)  Pharyngeal -  Nectar Cup (None)  Penetration/Aspiration details (nectar cup) (None)  Pharyngeal - Nectar Straw (None) Penetration/Aspiration details  (nectar straw) (None)  Pharyngeal - Nectar Syringe (None)  Penetration/Aspiration details (nectar syringe) (None)  Pharyngeal - Ice Chips (None)  Penetration/Aspiration details (ice chips) (None)  Pharyngeal - Thin Teaspoon (None)  Penetration/Aspiration details (thin teaspoon) (None)  Pharyngeal - Thin Cup (None)  Penetration/Aspiration  details (thin cup) (None)  Pharyngeal - Thin Straw (None)  Penetration/Aspiration details (thin straw) (None)  Pharyngeal - Thin Syringe (None)  Penetration/Aspiration details (thin syringe') (None)  Pharyngeal - Puree (None)  Penetration/Aspiration details (puree) (None)  Pharyngeal - Mechanical Soft (None)  Penetration/Aspiration details (mechanical soft) (None)  Pharyngeal - Regular (None)  Penetration/Aspiration details (regular) (None)  Pharyngeal - Multi-consistency (None)  Penetration/Aspiration details (multi-consistency) (None)  Pharyngeal - Pill (None)  Penetration/Aspiration details (pill) (None)  Pharyngeal Comment (None)      CHL IP CERVICAL ESOPHAGEAL PHASE 04/08/2015  Cervical Esophageal Phase WFL  Pudding Teaspoon (None)  Pudding Cup (None)  Honey Teaspoon (None)  Honey Cup (None)  Honey Straw (None)  Nectar Teaspoon (None)  Nectar Cup (None)  Nectar Straw (None)  Nectar Sippy Cup (None)  Thin Teaspoon (None)  Thin Cup (None)  Thin Straw (None)  Thin Sippy Cup (None)  Cervical Esophageal Comment (None)         Bueche, Fredirick [place]lin 04/08/2015, 3:03 PM  Celia B. Quentin Ore [name], CCC-SLP [phone]   Scheduled Meds: . antiseptic oral rinse  7 mL Mouth Rinse q12n4p  . aspirin  81 mg Oral Daily  . cefTAZidime (FORTAZ)  IV  1 g Intravenous Q12H  . chlorhexidine  15 mL Mouth Rinse BID  . citalopram  20 mg Oral Daily  . enoxaparin (LOVENOX) injection  40 mg Subcutaneous QHS  . feeding supplement (GLUCERNA 1.2 CAL)  1,000 mL Per Tube Q24H  . fentaNYL  25 mcg Transdermal Q72H  . gabapentin  300 mg Oral TID  . hydrALAZINE  25 mg Oral BID  . Influenza vac split quadrivalent PF  0.5 mL Intramuscular Tomorrow-1000  . insulin aspart  0-15 Units Subcutaneous Q4H  . insulin glargine  10 Units Subcutaneous QHS  . isosorbide dinitrate  10 mg Oral BID  . lamoTRIgine  50 mg Per Tube Daily  . levETIRAcetam  500 mg Per Tube BID  . predniSONE  5 mg Per Tube Daily  . sodium chloride  3 mL Intravenous  Q12H  . sucralfate  1 g Per Tube 3 times per day  . tacrolimus  0.5 mg Oral BID  . tacrolimus  3 mg Oral BID   Continuous Infusions: . sodium chloride 50 mL/hr at 04/10/15 0055    Active Problems:   Diabetes mellitus ([place])   Essential hypertension   UTI (lower urinary tract infection)   Sepsis ([place])   Acute renal failure superimposed on stage 3 chronic kidney disease (HCC)   Pneumonia   S/p cadaver renal transplant   Acute respiratory failure with hypoxia and hypercarbia (HCC)   Chronic systolic CHF (congestive heart failure) (HCC)   ESRD (end stage renal disease) ([place])   Penile ulcer   Acute kidney injury superimposed on CKD ([place])    Time spent: *25 min    [facility]  [facility] Pager [phone]*. If 7PM-7AM, please contact night-coverage at [email or web address], password [facility] 04/10/2015, 9:23 AM  LOS: 5 days

## 2015-04-10 NOTE — Progress Notes (Signed)
Pt is ready to return to [facility] today. MD spoke with pt's daughter, April, this am to provide medical update. April is in agreement with d/c plan. [facility] transport is required. NSG reviewed d/c summary, script, avs. Scripts included in d/c packet. D/C summary sent to SNF for review prior to d/c.  Werner Lean LCSW [phone]

## 2015-04-10 NOTE — Progress Notes (Signed)
ANTIBIOTIC CONSULT NOTE - FOLLOW UP  Pharmacy Consult for Ceftazidime Indication: rule out sepsis, pneumonia  Allergies  Allergen Reactions  . Amoxicillin Other (See Comments)    ON MAR  . Ampicillin Other (See Comments)    ON MAR  . Latex Other (See Comments)    ON MAR  . Morphine And Related Other (See Comments)    UNK  . Penicillins Other (See Comments)    ON MAR; tolerated Ceftriaxone 8/27-9/1  . Tape Other (See Comments)    ON MAR    Patient Measurements: Height: 6' (182.9 cm) Weight: 178 lb 2.1 oz (80.8 kg) IBW/kg (Calculated) : 77.6  Vital Signs: Temp: 99 F (37.2 C) (10/07 0900) Temp Source: Core (Comment) (10/07 0800) BP: 175/86 mmHg (10/07 0900) Pulse Rate: 81 (10/07 0900) Intake/Output from previous day: 10/06 0701 - 10/07 0700 In: 2609 [I.V.:1225; NL/ZJ:6734; IV Piggyback:100] Out: 1300 [Urine:1300] Intake/Output from this shift: Total I/O In: 125 [I.V.:50; NG/GT:75] Out: 175 [Urine:175]  Labs:  Recent Labs  04/08/15 1537 04/09/15 0340 04/10/15 0350  WBC  --   --  4.6  HGB  --   --  12.9*  PLT  --   --  172  CREATININE 1.91* 1.87* 1.54*   Estimated Creatinine Clearance: 55.3 mL/min (by C-G formula based on Cr of 1.54). No results for input(s): VANCOTROUGH, VANCOPEAK, VANCORANDOM, GENTTROUGH, GENTPEAK, GENTRANDOM, TOBRATROUGH, TOBRAPEAK, TOBRARND, AMIKACINPEAK, AMIKACINTROU, AMIKACIN in the last 72 hours.   Microbiology: Recent Results (from the past 720 hour(s))  Blood Culture (routine x 2)     Status: None (Preliminary result)   Collection Time: 04/05/15  4:45 PM  Result Value Ref Range Status   Specimen Description BLOOD RIGHT AC  5 ML IN [facility] BOTTLE  Final   Special Requests NONE  Final   Culture   Final    NO GROWTH 4 DAYS Performed at [facility]    Report Status PENDING  Incomplete  Blood Culture (routine x 2)     Status: None (Preliminary result)   Collection Time: 04/05/15  4:50 PM  Result Value Ref Range Status   Specimen Description BLOOD RIGHT HAND  5 ML IN [facility] BOTTLE  Final   Special Requests NONE  Final   Culture   Final    NO GROWTH 4 DAYS Performed at [facility]    Report Status PENDING  Incomplete  Urine culture     Status: None   Collection Time: 04/05/15  5:00 PM  Result Value Ref Range Status   Specimen Description URINE, CATHETERIZED  Final   Special Requests NONE  Final   Culture   Final    >=100,000 COLONIES/mL ESCHERICHIA COLI Performed at [facility]    Report Status 04/07/2015 FINAL  Final   Organism ID, Bacteria ESCHERICHIA COLI  Final      Susceptibility   Escherichia coli - MIC*    AMPICILLIN >=32 RESISTANT Resistant     CEFAZOLIN <=4 SENSITIVE Sensitive     CEFTRIAXONE <=1 SENSITIVE Sensitive     CIPROFLOXACIN >=4 RESISTANT Resistant     GENTAMICIN >=16 RESISTANT Resistant     IMIPENEM <=0.25 SENSITIVE Sensitive     NITROFURANTOIN <=16 SENSITIVE Sensitive     TRIMETH/SULFA <=20 SENSITIVE Sensitive     AMPICILLIN/SULBACTAM 16 INTERMEDIATE Intermediate     PIP/TAZO <=4 SENSITIVE Sensitive     * >=100,000 COLONIES/mL ESCHERICHIA COLI  MRSA PCR Screening     Status: None   Collection Time: 04/05/15  9:44 PM  Result Value Ref Range Status   MRSA by PCR NEGATIVE NEGATIVE Final    Comment:        The GeneXpert MRSA Assay (FDA approved for NASAL specimens only), is one component of a comprehensive MRSA colonization surveillance program. It is not intended to diagnose MRSA infection nor to guide or monitor treatment for MRSA infections.     Anti-infectives    Start     Dose/Rate Route Frequency Ordered Stop   04/06/15 1700  vancomycin (VANCOCIN) IVPB 1000 mg/200 mL premix  Status:  Discontinued     1,000 mg 200 mL/hr over 60 Minutes Intravenous Every 24 hours 04/05/15 1837 04/06/15 0713   04/06/15 1400  vancomycin (VANCOCIN) 1,250 mg in sodium chloride 0.9 % 250 mL IVPB  Status:  Discontinued     1,250 mg 166.7 mL/hr over 90 Minutes  Intravenous Every 24 hours 04/06/15 0713 04/09/15 1130   04/06/15 1000  cefTAZidime (FORTAZ) 1 g in dextrose 5 % 50 mL IVPB     1 g 100 mL/hr over 30 Minutes Intravenous Every 12 hours 04/06/15 0713     04/05/15 1830  cefTAZidime (FORTAZ) 1 g in dextrose 5 % 50 mL IVPB  Status:  Discontinued     1 g 100 mL/hr over 30 Minutes Intravenous Every 24 hours 04/05/15 1826 04/06/15 0713   04/05/15 1615  piperacillin-tazobactam (ZOSYN) IVPB 3.375 g     3.375 g 100 mL/hr over 30 Minutes Intravenous  Once 04/05/15 1614 04/05/15 1826   04/05/15 1615  vancomycin (VANCOCIN) IVPB 1000 mg/200 mL premix     1,000 mg 200 mL/hr over 60 Minutes Intravenous  Once 04/05/15 1614 04/05/15 1808      Assessment: Patient's a 59 y.o with renal transplant on prograf PTA.  He's currently on abx for PNA and UTI.  Today, 04/10/2015: - afeb, wbc wnl - scr trending down 1.54 (crcl~55) - 10/5 CXR: Left lower lobe infiltrate consistent with PNA  10/2 Zosyn x 1 10/2 >> Ceftaz >> 10/2 >> Vanc >> 10/6   10/2 blood x 2: ngtd 10/2 urine: >100K E.Coli (S ancef/ceftriaxone/imip/nitro/zosyn/bactrim; R amp/cipro/gent; I unasyn)  Plan:  Day #6 antibiotics - Increase ceftazidime to 1g IV q8h - Follow up renal function & cultures, transition to PO abx when appropriate

## 2015-04-10 NOTE — Discharge Summary (Signed)
Physician Discharge Summary  Justin [name]:[id number] DOB: April 13, 1956 DOA: 04/05/2015  PCP: Garwin Brothers, MD  Admit date: 04/05/2015 Discharge date: 04/10/2015  Time spent: *25 minutes  Recommendations for Outpatient Follow-up:  1. Follow up physician at the skilled facility 2. Will need duplex ultrasound of the right upper extremity in one week to check for clot progression to deep veins.  Discharge Diagnoses:  Active Problems:   Diabetes mellitus ([place])   Essential hypertension   UTI (lower urinary tract infection)   Sepsis ([place])   Acute renal failure superimposed on stage 3 chronic kidney disease (HCC)   Pneumonia   S/p cadaver renal transplant   Acute respiratory failure with hypoxia and hypercarbia (HCC)   Chronic systolic CHF (congestive heart failure) (HCC)   ESRD (end stage renal disease) (HCC)   Penile ulcer   Acute kidney injury superimposed on CKD [facility])   Discharge Condition: Stable  Diet recommendation: dysphagia 1 diet  Filed Weights   04/08/15 2100 04/09/15 0453 04/10/15 0500  Weight: 79.9 kg (176 lb 2.4 oz) 79.3 kg (174 lb 13.2 oz) 80.8 kg (178 lb 2.1 oz)    History of present illness:  59 year old male with a 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 Patient underwent biopsy of the lesion after that he sustained intracranial hemorrhage resulting in right-sided weakness and dysphasia requiring tracheostomy and PEG tube placement. Patient has been at the nursing home facility. Patient's lymphoma was treated with whole brain radiation and was deemed not a candidate for chemotherapy. Patient was brought to the hospital for worsening confusion and hypoxia. He was found to be in sepsis due to UTI and possible pneumonia  Hospital Course:  1. Acute encephalopathy- likely from the acute respiratory failure, healthcare associated pneumonia. Patient admitted to step down  unit and started on vancomycin and Ceftazidime. Blood cultures 2 have been negative so far. Patient is slowly improving. Vancomycin was discontinued. Will discharge on bactrim DS 1 tab po BID for 5 more days for the UTI 2. Sepsis due to UTI- Improving, patient presented with sepsis, urine culture is growing Escherichia coli. Continue IV Ceftazidime and has received five days of antibiotics. As above will be discharged on Bactrim DS 1 tab po BID for 5 more days, stop date 04/15/15 3. 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 Today creatinine is 1.54 4. Right upper extremity edema- ultrasound of the right upper extremity showed acute superficial thrombosis of the cephalic vein at the insertion of the IV site at the ante cubitus. 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 No evidence of DVT. Called the vascular surgeon on call Dr Scot Dock, and discussed the above results. He recommends no anticoagulation for the superficial thrombosis and repeat duplex ultrasound in one week to check for propagation of the thrombus to rule out DVT. 5. Hypernatremia- sodium was 152, likely from dehydration, started on D5W at 50 ml/hr, today sodium is down to 143.  6. History of cadaveric renal transplant- continue Prograf. 7. Penile/pubic ulcer- patient has large penile/pubic ulcer, no discharge noted. Discussed with patient's daughter who says it has been there for many years. He was seen by dermatology as well as urology as outpatient. Will keep the lesion clean and dry. Follow-up as outpatient. 8. Diabetes mellitus- continue sliding scale insulin with NovoLog. Continue Lantus 10 units daily. 9. Hypothermia- resolved, likely from sepsis,  patient required bear hugger blanket. 10. Hypotension- resolved, patient was having intermittent episodes of hypotension,  antihypertensives medications were held as the BP has is now elevated, will start scheduled  Hydralazine 25 mg po BID.As the BP is now elevated, will add Amlodipine 5 mg po daily. 11. Nutrition- patient has PEG tube in place, patient started on Glucerna tube feedings as per nutrition. 12. Elevated troponin- patient had elevated troponin likely from demand ischemia, troponin was elevated to 0.23 on admission and  came down to 0.09.   Procedures: None  Consultations:  None   Discharge Exam: Filed Vitals:   04/10/15 0900  BP: 175/86  Pulse: 81  Temp: 99 F (37.2 C)  Resp: 15    General: Appears in no acute distress Cardiovascular: S1S2 RRR Respiratory: Clear bilaterally  Discharge Instructions   Discharge Instructions    Diet - low sodium heart healthy    Complete by:  As directed      Increase activity slowly    Complete by:  As directed           Current Discharge Medication List    START taking these medications   Details  amLODipine (NORVASC) 5 MG tablet Take 1 tablet (5 mg total) by mouth daily.    sulfamethoxazole-trimethoprim (BACTRIM DS,SEPTRA DS) 800-160 MG tablet Take 1 tablet by mouth 2 (two) times daily. Qty: 10 tablet, Refills: 0      CONTINUE these medications which have CHANGED   Details  LORazepam (ATIVAN) 0.5 MG tablet Take 1 tablet (0.5 mg total) by mouth every 8 (eight) hours as needed. Anxiety Qty: 30 tablet, Refills: 0    oxyCODONE-acetaminophen (PERCOCET) 7.5-325 MG tablet Take 1 tablet by mouth every 4 (four) hours as needed for severe pain. Qty: 30 tablet, Refills: 0      CONTINUE these medications which have NOT CHANGED   Details  aspirin 81 MG tablet Take 81 mg by mouth daily.      atorvastatin (LIPITOR) 10 MG tablet Take 10 mg by mouth daily.    citalopram (CELEXA) 10 MG tablet Take 20 mg by mouth daily.     Cranberry 425 MG CAPS Take 425 mg by mouth daily.    Dextromethorphan-Quinidine 20-10 MG CAPS Take 1 capsule by mouth every 12 (twelve) hours.    fentaNYL (DURAGESIC - DOSED MCG/HR) 25 MCG/HR patch Place 25 mcg  onto the skin every 3 (three) days.    gabapentin (NEURONTIN) 300 MG capsule Take 300 mg by mouth 3 (three) times daily.     hydrALAZINE (APRESOLINE) 25 MG tablet Take 25 mg by mouth 2 (two) times daily.    insulin glargine (LANTUS) 100 UNIT/ML injection Inject 0.1 mLs (10 Units total) into the skin at bedtime. Qty: 10 mL, Refills: 11    insulin lispro (HUMALOG KWIKPEN) 100 UNIT/ML KiwkPen Inject 9 Units into the skin 3 (three) times daily before meals.    isosorbide dinitrate (ISORDIL) 10 MG tablet Take 10 mg by mouth 2 (two) times daily.    lamoTRIgine (LAMICTAL) 25 MG tablet Take 50 mg by mouth daily.    loperamide (IMODIUM A-D) 2 MG tablet Take 2 mg by mouth daily as needed for diarrhea or loose stools. Max 16 mg per day    !! Nutritional Supplements (NUTRITIONAL DRINK PO) Take 30 mLs by mouth daily.    omeprazole (PRILOSEC) 20 MG capsule Take 20 mg by mouth daily.      ondansetron (ZOFRAN) 4 MG tablet Take 4 mg by mouth  every 8 (eight) hours as needed for nausea or vomiting.    potassium & sodium phosphates (PHOS-NAK) 280-160-250 MG PACK Take 1 packet by mouth 4 (four) times daily -  with meals and at bedtime.    predniSONE (DELTASONE) 5 MG tablet Take 5 mg by mouth daily.    promethazine (PHENERGAN) 25 MG/ML injection Inject 25 mg into the vein every 6 (six) hours as needed for nausea or vomiting.    senna-docusate (SENOKOT-S) 8.6-50 MG per tablet Take 1 tablet by mouth daily.    sucralfate (CARAFATE) 1 GM/10ML suspension Take 1 g by mouth every 8 (eight) hours.    !! tacrolimus (PROGRAF) 0.5 MG capsule Take 0.5 mg by mouth 2 (two) times daily.    !! tacrolimus (PROGRAF) 1 MG capsule Take 3 mg by mouth 2 (two) times daily.    tiZANidine (ZANAFLEX) 2 MG tablet Take 2 mg by mouth 2 (two) times daily.     Vitamin D, Ergocalciferol, (DRISDOL) 50000 UNITS CAPS Take 50,000 Units by mouth every 30 (thirty) days.    Water For Irrigation, Sterile (FREE WATER) SOLN Place 200 mLs  into feeding tube every 8 (eight) hours.    allopurinol (ZYLOPRIM) 100 MG tablet Take 100 mg by mouth daily.      levETIRAcetam (KEPPRA) 100 MG/ML solution Place 5 mLs (500 mg total) into feeding tube 2 (two) times daily. Qty: 473 mL, Refills: 12    !! Nutritional Supplements (FEEDING SUPPLEMENT, GLUCERNA 1.2 CAL,) LIQD Place 1,000 mLs into feeding tube continuous.     !! - Potential duplicate medications found. Please discuss with provider.    STOP taking these medications     LORazepam (ATIVAN) 2 MG/ML injection        Allergies  Allergen Reactions  . Amoxicillin Other (See Comments)    ON MAR  . Ampicillin Other (See Comments)    ON MAR  . Latex Other (See Comments)    ON MAR  . Morphine And Related Other (See Comments)    UNK  . Penicillins Other (See Comments)    ON MAR; tolerated Ceftriaxone 8/27-9/1  . Tape Other (See Comments)    ON MAR      The results of significant diagnostics from this hospitalization (including imaging, microbiology, ancillary and laboratory) are listed below for reference.    Significant Diagnostic Studies: Ct Head Wo Contrast  04/05/2015   CLINICAL DATA:  Altered mental status. Patient reported to be altered from baseline. Patient nonverbal. No response of symptoms to or chronic reversal.  EXAM: CT HEAD WITHOUT CONTRAST  TECHNIQUE: Contiguous axial images were obtained from the base of the skull through the vertex without intravenous contrast.  COMPARISON:  03/01/2015.  FINDINGS: Generalized atrophy. Chronic microvascular ischemic change. 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 Compensatory enlargement LEFT lateral ventricle.  No acute stroke or acute hemorrhage. No extra-axial fluid. No mass lesion.  There is evidence for a LEFT frontal burr hole, otherwise the calvarium is intact. There is no acute sinus or mastoid disease. No  change from priors.  IMPRESSION: Remote LEFT hemisphere ischemic event is stable. No acute intracranial findings. No posttraumatic sequelae are evident.   Electronically Signed   By: Staci Righter M.D.   On: 04/05/2015 17:53   Ct Pelvis Wo Contrast  04/06/2015   CLINICAL DATA:  Altered mental status. Fever. Large ulcer/mass at the base of the penis  with foul odor. Renal transplant 9 years prior. Large B-cell lymphoma. Recent pneumonia and urinary tract infection.  EXAM: CT PELVIS WITHOUT CONTRAST  TECHNIQUE: Multidetector CT imaging of the pelvis was performed following the standard protocol without intravenous contrast.  COMPARISON:  03/03/2015 CT abdomen.  04/15/2010 CT abdomen/pelvis.  FINDINGS: Reproductive: Normal size prostate.  Bladder: Relatively collapsed urinary bladder. Foley catheter is in place within the urinary bladder. Gas within the nondependent bladder lumen is likely due to instrumentation. No definite bladder wall thickening. There is mild fullness of the right lower quadrant renal transplant collecting system without overt right hydronephrosis, stable since 03/03/2015. Tiny focus of gas within the medial transplant collecting system is nonspecific and likely due to the Foley catheter in the bladder. There is mild urothelial thickening throughout the transplant renal collecting system, nonspecific, stable since 03/03/2015. No peritransplant fluid collection.  Bowel: Moderate stool in the rectum. Oral contrast is seen extending to the distal rectum. No dilated small bowel loops in the pelvis. No appreciable bowel wall thickening.  Vascular/Lymphatic: No pathologically enlarged lymph nodes in the pelvis. Atherosclerosis is seen throughout the pelvic arteries.  Other: No pneumoperitoneum, ascites or focal fluid collection. There is nonspecific irregular skin thickening lateral and posterior to the proximal penile shaft (series 2/images 45-51) extending into the anterior superior scrotal skin.  There is no associated gas or fluid collection. No gas, significant hydrocele, mass or fluid collection is seen in the scrotum.  Musculoskeletal: No aggressive appearing focal osseous lesions. Calcification of the posterior annulus at L4-5 and L5-S1, likely representing chronic annular tears.  IMPRESSION: 1. Nonspecific irregular skin thickening lateral and posterior to the proximal penile shaft extending into the anterior superior scrotal skin, without associated gas or fluid collection. Given the history renal transplant, consider post transplant lymphoproliferative disorder (PTLD). Directed skin biopsy may be warranted. 2. Nonspecific gas in the bladder lumen and right renal transplant collecting system, likely due to instrumentation from the Foley catheter, cannot exclude gas producing urinary tract infection. Correlate with urinalysis. 3. Stable nonspecific urothelial thickening in the right renal transplant collecting system, which could indicate infection or rejection.   Electronically Signed   By: Ilona Sorrel M.D.   On: 04/06/2015 10:41   US Renal  04/06/2015   CLINICAL DATA:  Acute kidney injury superimposed on chronic kidney disease, history renal transplant  EXAM: RENAL / URINARY TRACT ULTRASOUND COMPLETE  COMPARISON:  04/06/2015, 02/27/2015  FINDINGS: Right Kidney:  Length: 8.7 cm. Marked increased echogenicity and diffuse atrophy. No hydronephrosis.  Left Kidney:  Length: 9.3 cm. Diffuse increased echogenicity and atrophy, not as severe as the right kidney. Echogenic shadowing lower pole calculus measures 8 mm. No hydronephrosis.  Right lower quadrant transplant kidney: Normal echogenicity and cortical thickness. Minor pelviectasis. No hydronephrosis. Transplant kidney measures 10.3 cm.  Bladder:  Not visualized.  Decompressed by Foley catheter.  Additional findings include numerous gallstones within the gallbladder.  IMPRESSION: Marked atrophy of the native kidneys without obstruction or  hydronephrosis  Mild pelviectasis of the transplant kidney  Cholelithiasis   Electronically Signed   By: Jerilynn Mages.  Shick M.D.   On: 04/06/2015 12:45   Dg Chest Port 1 View  04/08/2015   CLINICAL DATA:  Respiratory failure.  EXAM: PORTABLE CHEST 1 VIEW  COMPARISON:  04/07/2015.  FINDINGS: Mediastinum and hilar structures are unremarkable. Heart size normal. Left lower lobe infiltrate consistent pneumonia. No pleural effusion or pneumothorax. Previously identified gastrostomy tube not definitely visualized. Stable calcific density left upper quadrant.  IMPRESSION: Left lower lobe infiltrate consistent with pneumonia.   Electronically Signed   By: Marcello Moores  Register   On: 04/08/2015 07:08   Dg Chest Port 1 View  04/07/2015   CLINICAL DATA:  Followup exam for pneumonia versus pulmonary edema.  EXAM: PORTABLE CHEST 1 VIEW  COMPARISON:  04/05/2015  FINDINGS: Irregular interstitial densities and bilateral perihilar and lower lung zone airspace opacities have improved since the prior study. There are no new lung opacities. No convincing pleural effusion or pneumothorax. Cardiac silhouette is normal in size and configuration. Normal mediastinal and hilar contours.  IMPRESSION: Improved lung aeration most consistent with improved pulmonary edema. No convincing pneumonia.   Electronically Signed   By: Lajean Manes M.D.   On: 04/07/2015 08:55   Dg Chest Port 1 View  04/05/2015   CLINICAL DATA:  Altered mental status, hypotension, diabetes  EXAM: PORTABLE CHEST 1 VIEW  COMPARISON:  03/01/2015  FINDINGS: Cardiac size exaggerated by kyphotic positioning and low inspiratory effect. Allowing for this is likely upper normal. Vascular pattern appears without pulmonary venous hypertension. There are increased interstitial markings as well as perihilar infiltrates bilaterally. These are new from the prior study.  IMPRESSION: Appearance suggests pulmonary edema is a possibility. Bilateral lower lobe pneumonia could also be considered.    Electronically Signed   By: Skipper Cliche M.D.   On: 04/05/2015 16:48   Dg Swallowing Func-speech Pathology  04/08/2015   Lorre Nick, CCC-SLP     04/08/2015  3:04 PM  Objective Swallowing Evaluation:  (MBS)  Patient Details  Name: KACY CONELY MRN: [id number] Date of Birth: 1955/12/04  Today's Date: 04/08/2015 Time: SLP Start Time (ACUTE ONLY): 1435-SLP Stop Time (ACUTE  ONLY): 1450 SLP Time Calculation (min) (ACUTE ONLY): 15 min  Past Medical History:  Past Medical History  Diagnosis Date  . Hypertension   . Dyslipidemia   . Carpal tunnel syndrome   . A-V fistula ([place])   . Anemia   . Hemorrhoids   . Hyperparathyroidism   . Intracranial hemorrhage ([place])   . Hypotension   . Hyperkalemia   . Kidney disease   . Sepsis(995.91)   . GI bleeding   . DVT (deep venous thrombosis) ([place])   . PVD (peripheral vascular disease) ([place])   . DM (diabetes mellitus) ([place])   . Cellulitis   . IV infiltration     Right upper extremity  . PPD positive   . 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 Hypokinesis     EF of 60% with mild anterior hypokinesis, minimal coronary  artery disease on catheterization in December of 2007  . Gout   . Renal transplant, status post 10/19/2014   Past Surgical History:  Past Surgical History  Procedure Laterality Date  . Kidney transplant      History of renal transplant maintained on chronic  immunosuppressive therapy  . Av fistula placement      Right arm  . Peripherally inserted central catheter insertion      line placement  . Amputation    . Gastrostomy    . 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 Brain biopsy    . #6 shiley cuff      Less trache   HPI:  Other Pertinent Information: 59 year old male admitted 04/05/15  due to confusion and unresponsiveness. PMH significant for ICH,  large B-cell lymphoma. Orders received for BSE,  which revealed no  overt s/s aspiration of any consistency tested. However, pt with  history of com/cog deficits  and dysphagia, so MBS warranted.  No Data Recorded  Assessment / Plan / Recommendation CHL IP CLINICAL IMPRESSIONS 04/08/2015  Therapy Diagnosis Moderate oral phase dysphagia;Mild pharyngeal  phase dysphagia  Clinical Impression Pt presents with moderate oral and mild  pharyngeal sensory and motor based dysphagia, characterized as  follows: Orally, pt exhibits adequate manipulation of thin and  puree consistencies, however, was unable to masticate solid or  propel barium tablet posteriorly. Both solid and pill were  removed from pt oral cavity. Pharyngeally, Pt demonstrated  delayed swallow reflex on puree and thin consistencies, with  trigger at the level of the vallecular sinus. Puree consistency  was tolerated without penetration, aspiration, or post-swallow  residue. Thin liquid via straw was tolerated without penetration,  aspiration or post-swallow residue when bolus size and rate were  limited to small sips one at a time. Trace silent aspiration did  occur during the swallow of large consecutive boluses. Recommend  puree diet and thin liquids via straw, limiting bolus size and  rate to 1 small sip at a time. Crushed meds, alternating solids  and liquids, and regular thorough oral care are also recommended.   ST to follow for diet tolerance.      CHL IP TREATMENT RECOMMENDATION 04/08/2015  Treatment Recommendations Therapy as outlined in treatment plan  below     CHL IP DIET RECOMMENDATION 04/08/2015  SLP Diet Recommendations Dysphagia 1 (Puree);Thin  Liquid Administration via (None)  Medication Administration Crushed with puree  Compensations Small sips/bites;Slow rate;Follow solids with  liquid  Postural Changes and/or Swallow Maneuvers (None)     CHL IP OTHER RECOMMENDATIONS 04/08/2015  Recommended Consults (None) Oral Care Recommendations Oral care  BID;Staff/trained caregiver to provide oral care  Other Recommendations Have oral suction available     CHL IP FOLLOW UP RECOMMENDATIONS 03/04/2015  Follow up  Recommendations Skilled Nursing facility     [facility] IP FREQUENCY AND DURATION 04/08/2015  Speech Therapy Frequency (ACUTE ONLY) min 1 x/week  Treatment Duration 1 week     Pertinent Vitals/Pain VSS, no pain evident    SLP Swallow Goals No flowsheet data found.  No flowsheet data found.    CHL IP REASON FOR REFERRAL 04/08/2015  Reason for Referral Objectively evaluate swallowing function     CHL IP ORAL PHASE 04/08/2015  Lips (None)  Tongue (None)  Mucous membranes (None) Nutritional status (None)  Other (None)  Oxygen therapy (None)  Oral Phase Impaired  Oral - Pudding Teaspoon (None)  Oral - Pudding Cup (None)  Oral - Honey Teaspoon (None)  Oral - Honey Cup (None)  Oral - Honey Syringe (None)  Oral - Nectar Teaspoon (None)  Oral - Nectar Cup (None)  Oral - Nectar Straw (None)  Oral - Nectar Syringe (None)  Oral - Ice Chips (None)  Oral - Thin Teaspoon (None)  Oral - Thin Cup (None)  Oral - Thin Straw (None)  Oral - Thin Syringe (None)  Oral - Puree (None)  Oral - Mechanical Soft (None)  Oral - Regular (None)  Oral - Multi-consistency (None)  Oral - Pill (None)  Oral Phase - Comment (None)      CHL IP PHARYNGEAL PHASE 04/08/2015  Pharyngeal Phase Impaired  Pharyngeal - Pudding Teaspoon (None)  Penetration/Aspiration details (pudding teaspoon) (None)  Pharyngeal - Pudding Cup (None)  Penetration/Aspiration details (pudding cup) (None)  Pharyngeal - Honey Teaspoon (None)  Penetration/Aspiration details (honey teaspoon) (None)  Pharyngeal - Honey Cup (None)  Penetration/Aspiration details (honey cup) (None)  Pharyngeal - Honey Syringe (None)  Penetration/Aspiration details (honey syringe) (None)  Pharyngeal - Nectar Teaspoon (None)  Penetration/Aspiration details (nectar teaspoon) (None)  Pharyngeal - Nectar Cup (None)  Penetration/Aspiration details (nectar cup) (None)  Pharyngeal - Nectar Straw (None) Penetration/Aspiration details  (nectar straw) (None)  Pharyngeal - Nectar Syringe (None)  Penetration/Aspiration details  (nectar syringe) (None)  Pharyngeal - Ice Chips (None)  Penetration/Aspiration details (ice chips) (None)  Pharyngeal - Thin Teaspoon (None)  Penetration/Aspiration details (thin teaspoon) (None)  Pharyngeal - Thin Cup (None)  Penetration/Aspiration details (thin cup) (None)  Pharyngeal - Thin Straw (None)  Penetration/Aspiration details (thin straw) (None)  Pharyngeal - Thin Syringe (None)  Penetration/Aspiration details (thin syringe') (None)  Pharyngeal - Puree (None)  Penetration/Aspiration details (puree) (None)  Pharyngeal - Mechanical Soft (None)  Penetration/Aspiration details (mechanical soft) (None)  Pharyngeal - Regular (None)  Penetration/Aspiration details (regular) (None)  Pharyngeal - Multi-consistency (None)  Penetration/Aspiration details (multi-consistency) (None)  Pharyngeal - Pill (None)  Penetration/Aspiration details (pill) (None)  Pharyngeal Comment (None)      CHL IP CERVICAL ESOPHAGEAL PHASE 04/08/2015  Cervical Esophageal Phase WFL  Pudding Teaspoon (None)  Pudding Cup (None)  Honey Teaspoon (None)  Honey Cup (None)  Honey Straw (None)  Nectar Teaspoon (None)  Nectar Cup (None)  Nectar Straw (None)  Nectar Sippy Cup (None)  Thin Teaspoon (None)  Thin Cup (None)  Thin Straw (None)  Thin Sippy Cup (None)  Cervical Esophageal Comment (None)         [place], Fredirick Maudlin 04/08/2015, 3:03 PM  Celia B. Quentin Ore [facility], [place] [phone]   Microbiology: Recent Results (from the past 240 hour(s))  Blood Culture (routine x 2)     Status: None   Collection Time: 04/05/15  4:45 PM  Result Value Ref Range Status   Specimen Description BLOOD RIGHT AC  5 ML IN [facility] BOTTLE  Final   Special Requests NONE  Final   Culture   Final    NO GROWTH 5 DAYS Performed at [facility]    Report Status 04/10/2015 FINAL  Final  Blood Culture (routine x 2)     Status: None   Collection Time: 04/05/15  4:50 PM  Result Value Ref Range Status   Specimen Description BLOOD RIGHT HAND  5 ML IN [facility]  BOTTLE  Final   Special Requests NONE  Final   Culture   Final    NO GROWTH 5 DAYS Performed at [facility]    Report Status 04/10/2015 FINAL  Final  Urine culture     Status: None   Collection Time: 04/05/15  5:00 PM  Result Value Ref Range Status   Specimen Description URINE, CATHETERIZED  Final   Special Requests NONE  Final   Culture   Final    >=100,000 COLONIES/mL ESCHERICHIA COLI Performed at [facility]    Report Status 04/07/2015 FINAL  Final   Organism ID, Bacteria ESCHERICHIA COLI  Final      Susceptibility   Escherichia coli - MIC*    AMPICILLIN >=32 RESISTANT Resistant     CEFAZOLIN <=4 SENSITIVE Sensitive     CEFTRIAXONE <=1 SENSITIVE Sensitive     CIPROFLOXACIN >=4 RESISTANT Resistant     GENTAMICIN >=16 RESISTANT Resistant     IMIPENEM <=0.25 SENSITIVE Sensitive     NITROFURANTOIN <=16 SENSITIVE Sensitive     TRIMETH/SULFA <=20 SENSITIVE  Sensitive     AMPICILLIN/SULBACTAM 16 INTERMEDIATE Intermediate     PIP/TAZO <=4 SENSITIVE Sensitive     * >=100,000 COLONIES/mL ESCHERICHIA COLI  MRSA PCR Screening     Status: None   Collection Time: 04/05/15  9:44 PM  Result Value Ref Range Status   MRSA by PCR NEGATIVE NEGATIVE Final    Comment:        The GeneXpert MRSA Assay (FDA approved for NASAL specimens only), is one component of a comprehensive MRSA colonization surveillance program. It is not intended to diagnose MRSA infection nor to guide or monitor treatment for MRSA infections.      Labs: Basic Metabolic Panel:  Recent Labs Lab 04/06/15 0430 04/07/15 1020 04/08/15 1537 04/09/15 0340 04/10/15 0350  NA 147* 152* 147* 146* 145  K 4.7 4.6 4.1 3.9 4.5  CL 111 115* 113* 114* 113*  CO2 31 30 32 29 26  GLUCOSE 329* 221* 154* 146* 235*  BUN 66* 63* 65* 60* 46*  CREATININE 2.25* 2.05* 1.91* 1.87* 1.54*  CALCIUM 9.8 10.6* 10.2 10.0 9.9  MG 2.4  --   --   --   --   PHOS 2.7  --   --   --   --    Liver Function Tests:  Recent  Labs Lab 04/05/15 1650 04/06/15 0430 04/07/15 1020 04/10/15 0350  AST 55* 39 21 24  ALT 42 37 27 21  ALKPHOS 71 75 76 77  BILITOT 0.9 0.4 0.8 0.4  PROT 8.1 7.3 8.0 6.2*  ALBUMIN 2.8* 2.6* 2.7* 2.3*   No results for input(s): LIPASE, AMYLASE in the last 168 hours. No results for input(s): AMMONIA in the last 168 hours. CBC:  Recent Labs Lab 04/05/15 1650 04/06/15 0043 04/06/15 0430 04/07/15 0830 04/10/15 0350  WBC 9.0 9.7 8.3 7.6 4.6  NEUTROABS 7.0  --   --   --   --   HGB 14.6 13.0 13.0 14.8 12.9*  HCT 49.9 45.2 45.0 49.8 41.7  MCV 95.2 96.0 93.9 92.4 91.0  PLT 174 162 151 174 172   Cardiac Enzymes:  Recent Labs Lab 04/05/15 2244 04/06/15 0430 04/06/15 1110  TROPONINI 0.23* 0.15* 0.09*   BNP: BNP (last 3 results) No results for input(s): BNP in the last 8760 hours.  ProBNP (last 3 results) No results for input(s): PROBNP in the last 8760 hours.  CBG:  Recent Labs Lab 04/09/15 1603 04/09/15 1932 04/09/15 2256 04/10/15 0359 04/10/15 0740  GLUCAP 185* 188* 219* 230* 222*       Signed:  Abdulmalik Darco S  [facility] 04/10/2015, 11:00 AM

## 2015-04-10 NOTE — Progress Notes (Signed)
CSW continues to follow pt from [facility]. Pt / daughter plans for pt to return to SNF at d/c. Updated clinicals have been sent to SNF.  Werner Lean LCSW [phone]

## 2015-06-08 ENCOUNTER — Emergency Department (HOSPITAL_COMMUNITY): Payer: Medicare Other

## 2015-06-08 ENCOUNTER — Encounter (HOSPITAL_COMMUNITY): Payer: Self-pay | Admitting: *Deleted

## 2015-06-08 ENCOUNTER — Inpatient Hospital Stay (HOSPITAL_COMMUNITY)
Admission: EM | Admit: 2015-06-08 | Discharge: 2015-06-23 | DRG: 207 | Disposition: A | Discharge status: Skilled Nursing Facility | Payer: Medicare Other | Attending: Internal Medicine | Admitting: Internal Medicine

## 2015-06-08 DIAGNOSIS — Z885 Allergy status to narcotic agent status: Secondary | ICD-10-CM

## 2015-06-08 DIAGNOSIS — Z79899 Other long term (current) drug therapy: Secondary | ICD-10-CM

## 2015-06-08 DIAGNOSIS — G8191 Hemiplegia, unspecified affecting right dominant side: Secondary | ICD-10-CM | POA: Diagnosis present

## 2015-06-08 DIAGNOSIS — N179 Acute kidney failure, unspecified: Secondary | ICD-10-CM | POA: Diagnosis present

## 2015-06-08 DIAGNOSIS — D899 Disorder involving the immune mechanism, unspecified: Secondary | ICD-10-CM | POA: Diagnosis present

## 2015-06-08 DIAGNOSIS — R579 Shock, unspecified: Secondary | ICD-10-CM | POA: Diagnosis not present

## 2015-06-08 DIAGNOSIS — E785 Hyperlipidemia, unspecified: Secondary | ICD-10-CM | POA: Diagnosis present

## 2015-06-08 DIAGNOSIS — E875 Hyperkalemia: Secondary | ICD-10-CM | POA: Diagnosis present

## 2015-06-08 DIAGNOSIS — D696 Thrombocytopenia, unspecified: Secondary | ICD-10-CM | POA: Diagnosis present

## 2015-06-08 DIAGNOSIS — R569 Unspecified convulsions: Secondary | ICD-10-CM | POA: Diagnosis not present

## 2015-06-08 DIAGNOSIS — Z881 Allergy status to other antibiotic agents status: Secondary | ICD-10-CM | POA: Diagnosis not present

## 2015-06-08 DIAGNOSIS — A6001 Herpesviral infection of penis: Secondary | ICD-10-CM | POA: Diagnosis present

## 2015-06-08 DIAGNOSIS — D649 Anemia, unspecified: Secondary | ICD-10-CM | POA: Diagnosis present

## 2015-06-08 DIAGNOSIS — N5089 Other specified disorders of the male genital organs: Secondary | ICD-10-CM | POA: Diagnosis present

## 2015-06-08 DIAGNOSIS — Z86718 Personal history of other venous thrombosis and embolism: Secondary | ICD-10-CM | POA: Diagnosis not present

## 2015-06-08 DIAGNOSIS — A4101 Sepsis due to Methicillin susceptible Staphylococcus aureus: Secondary | ICD-10-CM | POA: Diagnosis not present

## 2015-06-08 DIAGNOSIS — R402214 Coma scale, best verbal response, none, 24 hours or more after hospital admission: Secondary | ICD-10-CM | POA: Diagnosis present

## 2015-06-08 DIAGNOSIS — Z91048 Other nonmedicinal substance allergy status: Secondary | ICD-10-CM

## 2015-06-08 DIAGNOSIS — Z923 Personal history of irradiation: Secondary | ICD-10-CM

## 2015-06-08 DIAGNOSIS — N186 End stage renal disease: Secondary | ICD-10-CM | POA: Diagnosis present

## 2015-06-08 DIAGNOSIS — R402312 Coma scale, best motor response, none, at arrival to emergency department: Secondary | ICD-10-CM | POA: Diagnosis present

## 2015-06-08 DIAGNOSIS — Z8249 Family history of ischemic heart disease and other diseases of the circulatory system: Secondary | ICD-10-CM | POA: Diagnosis not present

## 2015-06-08 DIAGNOSIS — E860 Dehydration: Secondary | ICD-10-CM | POA: Diagnosis present

## 2015-06-08 DIAGNOSIS — E870 Hyperosmolality and hypernatremia: Secondary | ICD-10-CM | POA: Diagnosis not present

## 2015-06-08 DIAGNOSIS — J155 Pneumonia due to Escherichia coli: Secondary | ICD-10-CM | POA: Diagnosis not present

## 2015-06-08 DIAGNOSIS — T17290A Other foreign object in pharynx causing asphyxiation, initial encounter: Secondary | ICD-10-CM | POA: Diagnosis present

## 2015-06-08 DIAGNOSIS — K219 Gastro-esophageal reflux disease without esophagitis: Secondary | ICD-10-CM | POA: Diagnosis present

## 2015-06-08 DIAGNOSIS — Z930 Tracheostomy status: Secondary | ICD-10-CM

## 2015-06-08 DIAGNOSIS — R1310 Dysphagia, unspecified: Secondary | ICD-10-CM | POA: Diagnosis present

## 2015-06-08 DIAGNOSIS — X58XXXA Exposure to other specified factors, initial encounter: Secondary | ICD-10-CM | POA: Diagnosis present

## 2015-06-08 DIAGNOSIS — I120 Hypertensive chronic kidney disease with stage 5 chronic kidney disease or end stage renal disease: Secondary | ICD-10-CM | POA: Diagnosis present

## 2015-06-08 DIAGNOSIS — Z931 Gastrostomy status: Secondary | ICD-10-CM | POA: Diagnosis not present

## 2015-06-08 DIAGNOSIS — Z789 Other specified health status: Secondary | ICD-10-CM | POA: Diagnosis not present

## 2015-06-08 DIAGNOSIS — E878 Other disorders of electrolyte and fluid balance, not elsewhere classified: Secondary | ICD-10-CM | POA: Diagnosis not present

## 2015-06-08 DIAGNOSIS — I4581 Long QT syndrome: Secondary | ICD-10-CM | POA: Diagnosis present

## 2015-06-08 DIAGNOSIS — Z833 Family history of diabetes mellitus: Secondary | ICD-10-CM | POA: Diagnosis not present

## 2015-06-08 DIAGNOSIS — Z452 Encounter for adjustment and management of vascular access device: Secondary | ICD-10-CM

## 2015-06-08 DIAGNOSIS — R402142 Coma scale, eyes open, spontaneous, at arrival to emergency department: Secondary | ICD-10-CM | POA: Diagnosis present

## 2015-06-08 DIAGNOSIS — E1101 Type 2 diabetes mellitus with hyperosmolarity with coma: Secondary | ICD-10-CM | POA: Diagnosis not present

## 2015-06-08 DIAGNOSIS — Z794 Long term (current) use of insulin: Secondary | ICD-10-CM | POA: Diagnosis not present

## 2015-06-08 DIAGNOSIS — I629 Nontraumatic intracranial hemorrhage, unspecified: Secondary | ICD-10-CM | POA: Diagnosis present

## 2015-06-08 DIAGNOSIS — Z7952 Long term (current) use of systemic steroids: Secondary | ICD-10-CM

## 2015-06-08 DIAGNOSIS — J690 Pneumonitis due to inhalation of food and vomit: Secondary | ICD-10-CM | POA: Diagnosis present

## 2015-06-08 DIAGNOSIS — R451 Restlessness and agitation: Secondary | ICD-10-CM | POA: Diagnosis not present

## 2015-06-08 DIAGNOSIS — E781 Pure hyperglyceridemia: Secondary | ICD-10-CM | POA: Diagnosis present

## 2015-06-08 DIAGNOSIS — F411 Generalized anxiety disorder: Secondary | ICD-10-CM | POA: Diagnosis present

## 2015-06-08 DIAGNOSIS — J9601 Acute respiratory failure with hypoxia: Secondary | ICD-10-CM | POA: Diagnosis present

## 2015-06-08 DIAGNOSIS — E871 Hypo-osmolality and hyponatremia: Secondary | ICD-10-CM | POA: Diagnosis present

## 2015-06-08 DIAGNOSIS — N183 Chronic kidney disease, stage 3 (moderate): Secondary | ICD-10-CM | POA: Diagnosis not present

## 2015-06-08 DIAGNOSIS — R7881 Bacteremia: Secondary | ICD-10-CM | POA: Diagnosis not present

## 2015-06-08 DIAGNOSIS — G40909 Epilepsy, unspecified, not intractable, without status epilepticus: Secondary | ICD-10-CM | POA: Diagnosis present

## 2015-06-08 DIAGNOSIS — B961 Klebsiella pneumoniae [K. pneumoniae] as the cause of diseases classified elsewhere: Secondary | ICD-10-CM | POA: Diagnosis present

## 2015-06-08 DIAGNOSIS — A6000 Herpesviral infection of urogenital system, unspecified: Secondary | ICD-10-CM | POA: Diagnosis present

## 2015-06-08 DIAGNOSIS — I959 Hypotension, unspecified: Secondary | ICD-10-CM | POA: Diagnosis not present

## 2015-06-08 DIAGNOSIS — J189 Pneumonia, unspecified organism: Secondary | ICD-10-CM | POA: Diagnosis not present

## 2015-06-08 DIAGNOSIS — C8510 Unspecified B-cell lymphoma, unspecified site: Secondary | ICD-10-CM | POA: Diagnosis present

## 2015-06-08 DIAGNOSIS — Z880 Allergy status to penicillin: Secondary | ICD-10-CM

## 2015-06-08 DIAGNOSIS — F329 Major depressive disorder, single episode, unspecified: Secondary | ICD-10-CM | POA: Diagnosis present

## 2015-06-08 DIAGNOSIS — N390 Urinary tract infection, site not specified: Secondary | ICD-10-CM | POA: Diagnosis present

## 2015-06-08 DIAGNOSIS — N189 Chronic kidney disease, unspecified: Secondary | ICD-10-CM

## 2015-06-08 DIAGNOSIS — Z87891 Personal history of nicotine dependence: Secondary | ICD-10-CM | POA: Diagnosis not present

## 2015-06-08 DIAGNOSIS — Z940 Kidney transplant status: Secondary | ICD-10-CM | POA: Diagnosis not present

## 2015-06-08 DIAGNOSIS — K529 Noninfective gastroenteritis and colitis, unspecified: Secondary | ICD-10-CM | POA: Diagnosis present

## 2015-06-08 DIAGNOSIS — G9340 Encephalopathy, unspecified: Secondary | ICD-10-CM | POA: Diagnosis present

## 2015-06-08 DIAGNOSIS — I739 Peripheral vascular disease, unspecified: Secondary | ICD-10-CM | POA: Diagnosis present

## 2015-06-08 DIAGNOSIS — J150 Pneumonia due to Klebsiella pneumoniae: Secondary | ICD-10-CM | POA: Diagnosis present

## 2015-06-08 DIAGNOSIS — Z91040 Latex allergy status: Secondary | ICD-10-CM

## 2015-06-08 DIAGNOSIS — M109 Gout, unspecified: Secondary | ICD-10-CM | POA: Diagnosis present

## 2015-06-08 DIAGNOSIS — Z515 Encounter for palliative care: Secondary | ICD-10-CM | POA: Diagnosis present

## 2015-06-08 DIAGNOSIS — B9620 Unspecified Escherichia coli [E. coli] as the cause of diseases classified elsewhere: Secondary | ICD-10-CM | POA: Diagnosis present

## 2015-06-08 DIAGNOSIS — B49 Unspecified mycosis: Secondary | ICD-10-CM | POA: Diagnosis present

## 2015-06-08 DIAGNOSIS — L89152 Pressure ulcer of sacral region, stage 2: Secondary | ICD-10-CM | POA: Diagnosis present

## 2015-06-08 DIAGNOSIS — B957 Other staphylococcus as the cause of diseases classified elsewhere: Secondary | ICD-10-CM | POA: Diagnosis present

## 2015-06-08 DIAGNOSIS — L89159 Pressure ulcer of sacral region, unspecified stage: Secondary | ICD-10-CM | POA: Diagnosis present

## 2015-06-08 DIAGNOSIS — L97909 Non-pressure chronic ulcer of unspecified part of unspecified lower leg with unspecified severity: Secondary | ICD-10-CM | POA: Diagnosis present

## 2015-06-08 DIAGNOSIS — E1165 Type 2 diabetes mellitus with hyperglycemia: Secondary | ICD-10-CM | POA: Diagnosis present

## 2015-06-08 DIAGNOSIS — E1122 Type 2 diabetes mellitus with diabetic chronic kidney disease: Secondary | ICD-10-CM | POA: Diagnosis present

## 2015-06-08 DIAGNOSIS — E213 Hyperparathyroidism, unspecified: Secondary | ICD-10-CM | POA: Diagnosis present

## 2015-06-08 DIAGNOSIS — F482 Pseudobulbar affect: Secondary | ICD-10-CM | POA: Diagnosis present

## 2015-06-08 DIAGNOSIS — R7611 Nonspecific reaction to tuberculin skin test without active tuberculosis: Secondary | ICD-10-CM | POA: Diagnosis present

## 2015-06-08 DIAGNOSIS — R739 Hyperglycemia, unspecified: Secondary | ICD-10-CM | POA: Diagnosis present

## 2015-06-08 DIAGNOSIS — L8990 Pressure ulcer of unspecified site, unspecified stage: Secondary | ICD-10-CM | POA: Diagnosis present

## 2015-06-08 DIAGNOSIS — A498 Other bacterial infections of unspecified site: Secondary | ICD-10-CM | POA: Diagnosis present

## 2015-06-08 DIAGNOSIS — C8300 Small cell B-cell lymphoma, unspecified site: Secondary | ICD-10-CM | POA: Diagnosis present

## 2015-06-08 DIAGNOSIS — Z7982 Long term (current) use of aspirin: Secondary | ICD-10-CM

## 2015-06-08 DIAGNOSIS — E119 Type 2 diabetes mellitus without complications: Secondary | ICD-10-CM | POA: Diagnosis present

## 2015-06-08 DIAGNOSIS — R0603 Acute respiratory distress: Secondary | ICD-10-CM

## 2015-06-08 DIAGNOSIS — B009 Herpesviral infection, unspecified: Secondary | ICD-10-CM | POA: Diagnosis present

## 2015-06-08 DIAGNOSIS — Z978 Presence of other specified devices: Secondary | ICD-10-CM

## 2015-06-08 HISTORY — DX: Pseudobulbar affect: F48.2

## 2015-06-08 HISTORY — DX: Gastro-esophageal reflux disease without esophagitis: K21.9

## 2015-06-08 LAB — GLUCOSE, CAPILLARY: Glucose-Capillary: >600 mg/dL (ref 65–99)

## 2015-06-08 LAB — BASIC METABOLIC PANEL
ANION GAP: 16 — AB (ref 5–15)
BUN: 71 mg/dL — ABNORMAL HIGH (ref 6–20)
CALCIUM: 10.6 mg/dL — AB (ref 8.9–10.3)
CO2: 23 mmol/L (ref 22–32)
Chloride: 109 mmol/L (ref 101–111)
Creatinine, Ser: 3.25 mg/dL — ABNORMAL HIGH (ref 0.61–1.24)
GFR, EST AFRICAN AMERICAN: 22 mL/min — AB (ref >60)
GFR, EST NON AFRICAN AMERICAN: 19 mL/min — AB (ref >60)
Glucose, Bld: 863 mg/dL (ref 65–99)
POTASSIUM: 5.1 mmol/L (ref 3.5–5.1)
Sodium: 148 mmol/L — ABNORMAL HIGH (ref 135–145)

## 2015-06-08 LAB — CBC WITH DIFFERENTIAL/PLATELET
Basophils Absolute: 0 10*3/uL (ref 0.0–0.1)
Basophils Relative: 0 %
EOS ABS: 0 10*3/uL (ref 0.0–0.7)
EOS PCT: 0 %
HCT: 61.7 % — ABNORMAL HIGH (ref 39.0–52.0)
Hemoglobin: 18 g/dL — ABNORMAL HIGH (ref 13.0–17.0)
LYMPHS ABS: 4 10*3/uL (ref 0.7–4.0)
Lymphocytes Relative: 22 %
MCH: 27.7 pg (ref 26.0–34.0)
MCHC: 29.2 g/dL — AB (ref 30.0–36.0)
MCV: 95.1 fL (ref 78.0–100.0)
MONOS PCT: 4 %
Monocytes Absolute: 0.7 10*3/uL (ref 0.1–1.0)
Neutro Abs: 13.3 10*3/uL — ABNORMAL HIGH (ref 1.7–7.7)
Neutrophils Relative %: 74 %
PLATELETS: 220 10*3/uL (ref 150–400)
RBC: 6.49 MIL/uL — ABNORMAL HIGH (ref 4.22–5.81)
RDW: 15 % (ref 11.5–15.5)
WBC: 18 10*3/uL — ABNORMAL HIGH (ref 4.0–10.5)

## 2015-06-08 LAB — COMPREHENSIVE METABOLIC PANEL
ALK PHOS: 91 U/L (ref 38–126)
ALT: 30 U/L (ref 17–63)
ANION GAP: 13 (ref 5–15)
AST: 44 U/L — ABNORMAL HIGH (ref 15–41)
Albumin: 3.2 g/dL — ABNORMAL LOW (ref 3.5–5.0)
BUN: 69 mg/dL — ABNORMAL HIGH (ref 6–20)
CALCIUM: 11.3 mg/dL — AB (ref 8.9–10.3)
CHLORIDE: 107 mmol/L (ref 101–111)
CO2: 30 mmol/L (ref 22–32)
Creatinine, Ser: 3.36 mg/dL — ABNORMAL HIGH (ref 0.61–1.24)
GFR calc non Af Amer: 18 mL/min — ABNORMAL LOW (ref >60)
GFR, EST AFRICAN AMERICAN: 21 mL/min — AB (ref >60)
Glucose, Bld: 867 mg/dL (ref 65–99)
Potassium: 5.9 mmol/L — ABNORMAL HIGH (ref 3.5–5.1)
SODIUM: 150 mmol/L — AB (ref 135–145)
Total Bilirubin: 1.1 mg/dL (ref 0.3–1.2)
Total Protein: 9 g/dL — ABNORMAL HIGH (ref 6.5–8.1)

## 2015-06-08 LAB — I-STAT ARTERIAL BLOOD GAS, ED
Acid-Base Excess: 3 mmol/L — ABNORMAL HIGH (ref 0.0–2.0)
Bicarbonate: 26.2 mEq/L — ABNORMAL HIGH (ref 20.0–24.0)
O2 Saturation: 100 %
PCO2 ART: 36.3 mmHg (ref 35.0–45.0)
PO2 ART: 381 mmHg — AB (ref 80.0–100.0)
Patient temperature: 98.6
TCO2: 27 mmol/L (ref 0–100)
pH, Arterial: 7.466 — ABNORMAL HIGH (ref 7.350–7.450)

## 2015-06-08 LAB — BETA-HYDROXYBUTYRIC ACID: BETA-HYDROXYBUTYRIC ACID: 2.11 mmol/L — AB (ref 0.05–0.27)

## 2015-06-08 LAB — TRIGLYCERIDES: TRIGLYCERIDES: 533 mg/dL — AB (ref <150)

## 2015-06-08 LAB — I-STAT TROPONIN, ED: Troponin i, poc: 0.15 ng/mL (ref 0.00–0.08)

## 2015-06-08 LAB — TSH: TSH: 1.061 u[IU]/mL (ref 0.350–4.500)

## 2015-06-08 LAB — LACTIC ACID, PLASMA: Lactic Acid, Venous: 6.3 mmol/L (ref 0.5–2.0)

## 2015-06-08 LAB — I-STAT CG4 LACTIC ACID, ED: Lactic Acid, Venous: 4.11 mmol/L (ref 0.5–2.0)

## 2015-06-08 LAB — PROCALCITONIN: Procalcitonin: 0.24 ng/mL

## 2015-06-08 MED ORDER — FENTANYL CITRATE (PF) 100 MCG/2ML IJ SOLN
50.0000 ug | Freq: Once | INTRAMUSCULAR | Status: AC
  Administered 2015-06-08: 50 ug via INTRAVENOUS
  Filled 2015-06-08: qty 2

## 2015-06-08 MED ORDER — HYDROCORTISONE NA SUCCINATE PF 100 MG IJ SOLR
50.0000 mg | Freq: Four times a day (QID) | INTRAMUSCULAR | Status: DC
Start: 2015-06-08 — End: 2015-06-09
  Administered 2015-06-08: 50 mg via INTRAVENOUS
  Filled 2015-06-08 (×2): qty 2

## 2015-06-08 MED ORDER — SODIUM CHLORIDE 0.9 % IV SOLN
250.0000 mL | INTRAVENOUS | Status: DC | PRN
  Administered 2015-06-18 – 2015-06-19 (×2): 500 mL via INTRAVENOUS

## 2015-06-08 MED ORDER — SODIUM CHLORIDE 0.9 % IV BOLUS (SEPSIS)
1000.0000 mL | Freq: Once | INTRAVENOUS | Status: AC
  Administered 2015-06-08: 1000 mL via INTRAVENOUS

## 2015-06-08 MED ORDER — PROPOFOL 1000 MG/100ML IV EMUL
INTRAVENOUS | Status: AC
  Filled 2015-06-08: qty 100

## 2015-06-08 MED ORDER — FENTANYL CITRATE (PF) 100 MCG/2ML IJ SOLN
100.0000 ug | INTRAMUSCULAR | Status: DC | PRN
Start: 2015-06-08 — End: 2015-06-09

## 2015-06-08 MED ORDER — MIDAZOLAM HCL 2 MG/2ML IJ SOLN: 2.0000 mg | INTRAMUSCULAR | Status: DC | PRN

## 2015-06-08 MED ORDER — FENTANYL CITRATE (PF) 100 MCG/2ML IJ SOLN: 100.0000 ug | INTRAMUSCULAR | Status: DC | PRN

## 2015-06-08 MED ORDER — FREE WATER
200.0000 mL | Freq: Three times a day (TID) | Status: DC
  Administered 2015-06-08 – 2015-06-10 (×5): 200 mL

## 2015-06-08 MED ORDER — ETOMIDATE 2 MG/ML IV SOLN
INTRAVENOUS | Status: DC | PRN
  Administered 2015-06-08: 20 mg via INTRAVENOUS

## 2015-06-08 MED ORDER — ASPIRIN 81 MG PO TABS: 81.0000 mg | ORAL_TABLET | Freq: Every day | ORAL | Status: DC

## 2015-06-08 MED ORDER — TACROLIMUS 0.5 MG PO CAPS
0.5000 mg | ORAL_CAPSULE | Freq: Two times a day (BID) | ORAL | Status: DC
  Administered 2015-06-08 – 2015-06-15 (×15): 0.5 mg via ORAL
  Filled 2015-06-08 (×17): qty 1

## 2015-06-08 MED ORDER — IPRATROPIUM-ALBUTEROL 0.5-2.5 (3) MG/3ML IN SOLN
3.0000 mL | Freq: Four times a day (QID) | RESPIRATORY_TRACT | Status: DC
  Administered 2015-06-08 – 2015-06-16 (×30): 3 mL via RESPIRATORY_TRACT
  Filled 2015-06-08 (×29): qty 3

## 2015-06-08 MED ORDER — SUCCINYLCHOLINE CHLORIDE 20 MG/ML IJ SOLN
INTRAMUSCULAR | Status: DC | PRN
  Administered 2015-06-08: 150 mg via INTRAVENOUS

## 2015-06-08 MED ORDER — SODIUM CHLORIDE 0.9 % IV SOLN
INTRAVENOUS | Status: DC
  Administered 2015-06-08: 21:00:00 via INTRAVENOUS
  Administered 2015-06-09: 1000 mL via INTRAVENOUS

## 2015-06-08 MED ORDER — PREDNISONE 5 MG PO TABS: 5.0000 mg | ORAL_TABLET | Freq: Every day | ORAL | Status: DC

## 2015-06-08 MED ORDER — HEPARIN SODIUM (PORCINE) 5000 UNIT/ML IJ SOLN
5000.0000 [IU] | Freq: Three times a day (TID) | INTRAMUSCULAR | Status: DC
  Administered 2015-06-08 – 2015-06-23 (×45): 5000 [IU] via SUBCUTANEOUS
  Filled 2015-06-08 (×44): qty 1

## 2015-06-08 MED ORDER — PROPOFOL 1000 MG/100ML IV EMUL: 0.0000 ug/kg/min | INTRAVENOUS | Status: DC

## 2015-06-08 MED ORDER — LAMOTRIGINE 25 MG PO TABS
50.0000 mg | ORAL_TABLET | Freq: Every day | ORAL | Status: DC
  Administered 2015-06-08 – 2015-06-23 (×15): 50 mg
  Filled 2015-06-08 (×16): qty 2

## 2015-06-08 MED ORDER — INSULIN ASPART 100 UNIT/ML ~~LOC~~ SOLN: 2.0000 [IU] | SUBCUTANEOUS | Status: DC

## 2015-06-08 MED ORDER — DEXTROSE 5 % IV SOLN
1.0000 g | Freq: Three times a day (TID) | INTRAVENOUS | Status: DC
  Administered 2015-06-08 – 2015-06-09 (×2): 1 g via INTRAVENOUS
  Filled 2015-06-08 (×3): qty 1

## 2015-06-08 MED ORDER — LEVETIRACETAM 100 MG/ML PO SOLN
500.0000 mg | Freq: Two times a day (BID) | ORAL | Status: DC
  Administered 2015-06-08 – 2015-06-23 (×30): 500 mg
  Filled 2015-06-08 (×31): qty 5

## 2015-06-08 MED ORDER — VANCOMYCIN HCL 10 G IV SOLR
1500.0000 mg | Freq: Once | INTRAVENOUS | Status: AC
  Administered 2015-06-08: 1500 mg via INTRAVENOUS
  Filled 2015-06-08: qty 1500

## 2015-06-08 MED ORDER — PANTOPRAZOLE SODIUM 40 MG IV SOLR: 40.0000 mg | Freq: Every day | INTRAVENOUS | Status: DC

## 2015-06-08 MED ORDER — SUCRALFATE 1 GM/10ML PO SUSP
1.0000 g | Freq: Three times a day (TID) | ORAL | Status: DC
  Administered 2015-06-08 – 2015-06-23 (×45): 1 g
  Filled 2015-06-08 (×45): qty 10

## 2015-06-08 MED ORDER — PANTOPRAZOLE SODIUM 40 MG PO TBEC: 40.0000 mg | DELAYED_RELEASE_TABLET | Freq: Every day | ORAL | Status: DC

## 2015-06-08 MED ORDER — IPRATROPIUM-ALBUTEROL 0.5-2.5 (3) MG/3ML IN SOLN: 3.0000 mL | Freq: Four times a day (QID) | RESPIRATORY_TRACT | Status: DC

## 2015-06-08 MED ORDER — ASPIRIN EC 81 MG PO TBEC
81.0000 mg | DELAYED_RELEASE_TABLET | Freq: Every day | ORAL | Status: DC
  Administered 2015-06-08: 81 mg via ORAL
  Filled 2015-06-08: qty 1

## 2015-06-08 MED ORDER — INSULIN ASPART 100 UNIT/ML ~~LOC~~ SOLN
10.0000 [IU] | Freq: Once | SUBCUTANEOUS | Status: AC
  Administered 2015-06-08: 10 [IU] via INTRAVENOUS
  Filled 2015-06-08: qty 1

## 2015-06-08 MED ORDER — SODIUM CHLORIDE 0.9 % IV SOLN
25.0000 ug/h | INTRAVENOUS | Status: DC
  Filled 2015-06-08: qty 50

## 2015-06-08 MED ORDER — FENTANYL BOLUS VIA INFUSION
50.0000 ug | INTRAVENOUS | Status: DC | PRN
  Filled 2015-06-08: qty 50

## 2015-06-08 MED ORDER — PANTOPRAZOLE SODIUM 40 MG IV SOLR
40.0000 mg | Freq: Every day | INTRAVENOUS | Status: DC
  Administered 2015-06-08 – 2015-06-14 (×7): 40 mg via INTRAVENOUS
  Filled 2015-06-08 (×7): qty 40

## 2015-06-08 MED ORDER — TACROLIMUS 1 MG PO CAPS
3.0000 mg | ORAL_CAPSULE | Freq: Two times a day (BID) | ORAL | Status: DC
  Administered 2015-06-08 – 2015-06-15 (×15): 3 mg via ORAL
  Filled 2015-06-08 (×18): qty 3

## 2015-06-08 MED ORDER — SODIUM CHLORIDE 0.9 % IV SOLN
INTRAVENOUS | Status: DC
  Administered 2015-06-09: 27.8 [IU]/h via INTRAVENOUS
  Administered 2015-06-09: 5.4 [IU]/h via INTRAVENOUS
  Administered 2015-06-10 – 2015-06-11 (×2): via INTRAVENOUS
  Filled 2015-06-08 (×6): qty 2.5

## 2015-06-08 MED ORDER — PROPOFOL 1000 MG/100ML IV EMUL
5.0000 ug/kg/min | Freq: Once | INTRAVENOUS | Status: AC
  Administered 2015-06-08: 5 ug/kg/min via INTRAVENOUS

## 2015-06-08 MED ORDER — ATORVASTATIN CALCIUM 10 MG PO TABS
10.0000 mg | ORAL_TABLET | Freq: Every day | ORAL | Status: DC
  Administered 2015-06-09 – 2015-06-22 (×14): 10 mg
  Filled 2015-06-08 (×14): qty 1

## 2015-06-08 MED ORDER — SODIUM CHLORIDE 0.9 % IV SOLN
1.0000 g | Freq: Once | INTRAVENOUS | Status: AC
  Administered 2015-06-08: 1 g via INTRAVENOUS
  Filled 2015-06-08: qty 10

## 2015-06-08 NOTE — ED Notes (Signed)
Pt has now returned from [facility] without incident.

## 2015-06-08 NOTE — ED Notes (Signed)
PT here from [facility].  Staff called EMS stating that pt had been agonally breathing since 3 pm.  Pt unresponsive to verbal stimuli, agonal breathing

## 2015-06-08 NOTE — ED Notes (Signed)
Resident notified of blood glucose in 800's.

## 2015-06-08 NOTE — Progress Notes (Signed)
ANTIBIOTIC CONSULT NOTE - INITIAL  Pharmacy Consult for vancomycin + aztreonam Indication: empiric  Allergies  Allergen Reactions  . Amoxicillin Other (See Comments)    ON MAR  . Ampicillin Other (See Comments)    ON MAR  . Latex Other (See Comments)    ON MAR  . Morphine And Related Other (See Comments)    UNK  . Penicillins Other (See Comments)    ON MAR; tolerated Ceftriaxone 8/27-9/1  . Tape Other (See Comments)    ON MAR    Patient Measurements: Height: 6' (182.9 cm) Weight: 178 lb 2.1 oz (80.8 kg) IBW/kg (Calculated) : 77.6 Adjusted Body Weight:   Vital Signs: BP: 113/74 mmHg (12/05 1915) Pulse Rate: 116 (12/05 1838) Intake/Output from previous day:   Intake/Output from this shift:    Labs:  Recent Labs  06/08/15 1840  WBC 18.0*  HGB 18.0*  PLT 220  CREATININE 3.36*   Estimated Creatinine Clearance: 25.3 mL/min (by C-G formula based on Cr of 3.36). No results for input(s): VANCOTROUGH, VANCOPEAK, VANCORANDOM, GENTTROUGH, GENTPEAK, GENTRANDOM, TOBRATROUGH, TOBRAPEAK, TOBRARND, AMIKACINPEAK, AMIKACINTROU, AMIKACIN in the last 72 hours.   Microbiology: No results found for this or any previous visit (from the past 720 hour(s)).  Medical History: Past Medical History  Diagnosis Date  . Hypertension   . Dyslipidemia   . Carpal tunnel syndrome   . A-V fistula ([place])   . Anemia   . Hemorrhoids   . Hyperparathyroidism   . Intracranial hemorrhage ([place])   . Hypotension   . Hyperkalemia   . Kidney disease   . Sepsis(995.91)   . GI bleeding   . DVT (deep venous thrombosis) ([place])   . PVD (peripheral vascular disease) ([place])   . DM (diabetes mellitus) ([place])   . Cellulitis   . IV infiltration     Right upper extremity  . PPD positive   . 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 Hypokinesis     EF of 60% with mild anterior hypokinesis, minimal coronary artery disease on catheterization in December of 2007   . Gout   . Renal transplant, status post 10/19/2014    Medications:  Anti-infectives    Start     Dose/Rate Route Frequency Ordered Stop   06/08/15 2000  vancomycin (VANCOCIN) 1,500 mg in sodium chloride 0.9 % 500 mL IVPB     1,500 mg 250 mL/hr over 120 Minutes Intravenous  Once 06/08/15 1958     06/08/15 2000  aztreonam (AZACTAM) 1 g in dextrose 5 % 50 mL IVPB     1 g 100 mL/hr over 30 Minutes Intravenous Every 8 hours 06/08/15 1958       Assessment: 47 yom presented to the ED with respiratory distress requiring intubation. Pt with an elevated WBC is 18. Scr is also elevated at 3.36. Pt has a history of renal transplant. To start empiric vancomycin + aztreonam.   vanc 12/5>> Aztreo 12/5>>  Goal of Therapy:  Vancomycin trough level 15-20 mcg/ml  Plan:  - Vancomycin 1500mg  IV x 1 then f/u Scr trends for further doses - Aztreonam 1gm IV Q8H - F/u renal fxn, C&S, clinical status and trough at [place], Rande Lawman 06/08/2015,8:00 PM

## 2015-06-08 NOTE — ED Provider Notes (Signed)
CSN: [id number]     Arrival date & time 06/08/15  1826 History   First MD Initiated Contact with Patient 06/08/15 [place]     Chief Complaint  Patient presents with  . Respiratory Distress   Patient is a 59 y.o. male presenting with shortness of breath. The history is provided by the EMS personnel.  Shortness of Breath Severity:  Severe Onset quality:  Gradual Duration:  4 hours Timing:  Constant Progression:  Worsening Chronicity:  New Relieved by:  Nothing Ineffective treatments:  None tried   Past Medical History  Diagnosis Date  . Hypertension   . Dyslipidemia   . Carpal tunnel syndrome   . A-V fistula ([place])   . Anemia   . Hemorrhoids   . Hyperparathyroidism   . Intracranial hemorrhage ([place])   . Hypotension   . Hyperkalemia   . Kidney disease   . Sepsis(995.91)   . GI bleeding   . DVT (deep venous thrombosis) ([place])   . PVD (peripheral vascular disease) ([place])   . DM (diabetes mellitus) ([place])   . Cellulitis   . IV infiltration     Right upper extremity  . PPD positive   . 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 Hypokinesis     EF of 60% with mild anterior hypokinesis, minimal coronary artery disease on catheterization in December of 2007  . Gout   . Renal transplant, status post 10/19/2014   Past Surgical History  Procedure Laterality Date  . Kidney transplant      History of renal transplant maintained on chronic immunosuppressive therapy  . Av fistula placement      Right arm  . Peripherally inserted central catheter insertion      line placement  . Amputation    . Gastrostomy    . 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 Brain biopsy    . #6 shiley cuff      Less trache   Family History  Problem Relation Age of Onset  . Heart disease Mother   . CAD Mother   . Diabetes type II Mother   . Hypertension Mother   . Cancer Neg Hx    Social History  Substance Use Topics  . Smoking status: Former  Smoker    Quit date: 01/06/2001  . Smokeless tobacco: None  . Alcohol Use: No    Review of Systems  Unable to perform ROS: Mental status change  Respiratory: Positive for shortness of breath.    Allergies  Amoxicillin; Ampicillin; Latex; Morphine and related; Penicillins; and Tape  Home Medications   Prior to Admission medications   Medication Sig Start Date End Date Taking? Authorizing Provider  allopurinol (ZYLOPRIM) 100 MG tablet Place 100 mg into feeding tube daily.    Yes Historical Provider, MD  amLODipine (NORVASC) 5 MG tablet Take 1 tablet (5 mg total) by mouth daily. Patient taking differently: Place 5 mg into feeding tube daily.  04/10/15  Yes Oswald Hillock, MD  aspirin 81 MG tablet Place 81 mg into feeding tube daily.    Yes Historical Provider, MD  atorvastatin (LIPITOR) 10 MG tablet Place 10 mg into feeding tube daily.    Yes Historical Provider, MD  cefTRIAXone (ROCEPHIN) 1 G injection Inject 1 g into the muscle once. For aspiration   Yes Historical Provider, MD  citalopram (CELEXA) 10 MG tablet Place 20 mg into feeding tube daily.  Yes Historical Provider, MD  Cranberry 425 MG CAPS Place 425 mg into feeding tube daily.    Yes Historical Provider, MD  Dextromethorphan-Quinidine 20-10 MG CAPS 1 capsule by PEG Tube route every 12 (twelve) hours.    Yes Historical Provider, MD  fentaNYL (DURAGESIC - DOSED MCG/HR) 25 MCG/HR patch Place 25 mcg onto the skin every 3 (three) days.   Yes Historical Provider, MD  gabapentin (NEURONTIN) 300 MG capsule Place 300 mg into feeding tube 3 (three) times daily.  05/20/13  Yes Historical Provider, MD  hydrALAZINE (APRESOLINE) 25 MG tablet 25 mg by PEG Tube route 2 (two) times daily.    Yes Historical Provider, MD  insulin glargine (LANTUS) 100 UNIT/ML injection Inject 0.1 mLs (10 Units total) into the skin at bedtime. Patient taking differently: Inject 14 Units into the skin at bedtime.  10/21/14  Yes Florencia Reasons, MD  insulin lispro (HUMALOG  KWIKPEN) 100 UNIT/ML KiwkPen Inject 9 Units into the skin 3 (three) times daily before meals.   Yes Historical Provider, MD  ipratropium-albuterol (DUONEB) 0.5-2.5 (3) MG/3ML SOLN Take 3 mLs by nebulization 4 (four) times daily.   Yes Historical Provider, MD  isosorbide dinitrate (ISORDIL) 10 MG tablet 10 mg by PEG Tube route 2 (two) times daily.    Yes Historical Provider, MD  lamoTRIgine (LAMICTAL) 25 MG tablet Take 50 mg by mouth daily.   Yes Historical Provider, MD  levETIRAcetam (KEPPRA) 100 MG/ML solution Place 5 mLs (500 mg total) into feeding tube 2 (two) times daily. 03/06/15  Yes Charlynne Cousins, MD  loperamide (IMODIUM A-D) 2 MG tablet Take 2 mg by mouth daily as needed for diarrhea or loose stools. Max 16 mg per day   Yes Historical Provider, MD  LORazepam (ATIVAN) 0.5 MG tablet Take 1 tablet (0.5 mg total) by mouth every 8 (eight) hours as needed. Anxiety 04/10/15  Yes Oswald Hillock, MD  LORazepam (ATIVAN) 2 MG/ML injection Inject 1 mg into the muscle every 6 (six) hours as needed for seizure.   Yes Historical Provider, MD  morphine (ROXANOL) 20 MG/ML concentrated solution Take 5 mg by mouth every hour as needed (air hunger).   Yes Historical Provider, MD  Multiple Vitamins-Minerals (MULTIVITAMIN WITH MINERALS) tablet Take 1 tablet by mouth daily.   Yes Historical Provider, MD  Nutritional Supplements (FEEDING SUPPLEMENT, GLUCERNA 1.5 CAL,) LIQD Place 60 mL/hr into feeding tube See admin instructions. 21ml/hr from 6p to 6a   Yes Historical Provider, MD  omeprazole (PRILOSEC) 20 MG capsule Place 20 mg into feeding tube daily.    Yes Historical Provider, MD  ondansetron (ZOFRAN) 4 MG tablet Take 4 mg by mouth every 8 (eight) hours as needed for nausea or vomiting.   Yes Historical Provider, MD  oxyCODONE-acetaminophen (PERCOCET) 7.5-325 MG tablet Take 1 tablet by mouth every 4 (four) hours as needed for severe pain. 04/10/15  Yes Oswald Hillock, MD  potassium & sodium phosphates (PHOS-NAK)  280-160-250 MG PACK Place 1 packet into feeding tube 4 (four) times daily.    Yes Historical Provider, MD  predniSONE (DELTASONE) 5 MG tablet Place 5 mg into feeding tube daily.  03/30/15  Yes Historical Provider, MD  Probiotic Product (PROBIOTIC PO) Take 1 capsule by mouth 2 (two) times daily.   Yes Historical Provider, MD  promethazine (PHENERGAN) 25 MG/ML injection Inject 25 mg into the vein every 6 (six) hours as needed for nausea or vomiting.   Yes Historical Provider, MD  senna-docusate (SENOKOT-S) 8.6-50 MG  per tablet 1 tablet by PEG Tube route daily.    Yes Historical Provider, MD  sucralfate (CARAFATE) 1 GM/10ML suspension Place 1 g into feeding tube every 8 (eight) hours.    Yes Historical Provider, MD  tacrolimus (PROGRAF) 0.5 MG capsule Take 0.5 mg by mouth 2 (two) times daily.   Yes Historical Provider, MD  tacrolimus (PROGRAF) 1 MG capsule 3 mg by PEG Tube route 2 (two) times daily.    Yes Historical Provider, MD  tiZANidine (ZANAFLEX) 2 MG tablet Place 2 mg into feeding tube 2 (two) times daily.  05/26/13  Yes Historical Provider, MD  Vitamin D, Ergocalciferol, (DRISDOL) 50000 UNITS CAPS Place 50,000 Units into feeding tube every 30 (thirty) days.    Yes Historical Provider, MD  Water For Irrigation, Sterile (FREE WATER) SOLN Place 200 mLs into feeding tube every 8 (eight) hours. 03/06/15  Yes Charlynne Cousins, MD  Nutritional Supplements (FEEDING SUPPLEMENT, GLUCERNA 1.2 CAL,) LIQD Place 1,000 mLs into feeding tube continuous. Patient not taking: Reported on 04/05/2015 03/06/15   Charlynne Cousins, MD   BP 113/74 mmHg  Pulse 116  Resp 18  Ht 6' (1.829 m)  Wt 80.8 kg  BMI 24.15 kg/m2  SpO2 100% Physical Exam  Constitutional: He appears distressed.  Agonal respirations, unresponsive  HENT:  Head: Normocephalic and atraumatic.  Eyes:  Anisocoric, L>R, sluggish  Neck: Neck supple. No tracheal deviation present.  Trach scar  Cardiovascular: Normal rate and regular rhythm.    Pulmonary/Chest: He is in respiratory distress.  Abdominal: He exhibits no distension.  Musculoskeletal: He exhibits no edema.  BUE graft scars  Neurological: GCS eye subscore is 4. GCS verbal subscore is 1. GCS motor subscore is 1.  Limited due to acuity of situation    ED Course  .Intubation Date/Time: 06/08/2015 7:45 PM Performed by: Gustavus Bryant Authorized by: Gustavus Bryant Consent: The procedure was performed in an emergent situation. Indications: airway protection Intubation method: direct Patient status: paralyzed (RSI) Sedatives: etomidate Paralytic: succinylcholine Laryngoscope size: Mac 4 Tube size: 7.5 mm Tube type: cuffed Number of attempts: 1 Cricoid pressure: no Cords visualized: yes Post-procedure assessment: chest rise and CO2 detector Breath sounds: equal and absent over the epigastrium Cuff inflated: yes ETT to lip: 23 cm Tube secured with: ETT holder Chest x-ray interpreted by me and radiologist. Chest x-ray findings: endotracheal tube in appropriate position Patient tolerance: Patient tolerated the procedure well with no immediate complications Comments: Large foreign object obstructing airway - appears to be tissue paper - removed digitally once tube secured.     Labs Review Labs Reviewed  CBC WITH DIFFERENTIAL/PLATELET - Abnormal; Notable for the following:    WBC 18.0 (*)    RBC 6.49 (*)    Hemoglobin 18.0 (*)    HCT 61.7 (*)    MCHC 29.2 (*)    Neutro Abs 13.3 (*)    All other components within normal limits  COMPREHENSIVE METABOLIC PANEL - Abnormal; Notable for the following:    Sodium 150 (*)    Potassium 5.9 (*)    Glucose, Bld 867 (*)    BUN 69 (*)    Creatinine, Ser 3.36 (*)    Calcium 11.3 (*)    Total Protein 9.0 (*)    Albumin 3.2 (*)    AST 44 (*)    GFR calc non Af Amer 18 (*)    GFR calc Af Amer 21 (*)    All other components within normal limits  I-STAT TROPOININ,  ED - Abnormal; Notable for the following:    Troponin  i, poc 0.15 (*)    All other components within normal limits  I-STAT CG4 LACTIC ACID, ED - Abnormal; Notable for the following:    Lactic Acid, Venous 4.11 (*)    All other components within normal limits  I-STAT CHEM 8, ED  I-STAT VENOUS BLOOD GAS, ED    Imaging Review Dg Chest Portable 1 View  06/08/2015  CLINICAL DATA:  Unresponsive patient.  Status post intubation. EXAM: PORTABLE CHEST 1 VIEW COMPARISON:  04/08/2015 FINDINGS: There has been an intubation. The endotracheal tube overlies the tracheal air column and terminates at the level of the clavicular heads. Enteric catheter is also seen, tip overlying the expected location of gastric cardia, side hole slightly past the expected location of the gastroesophageal junction. Cardiomediastinal silhouette is normal. Mediastinal contours appear intact. There is no evidence of focal airspace consolidation, pleural effusion or pneumothorax. Lungs are low volume. Osseous structures are without acute abnormality. Soft tissues are grossly normal. IMPRESSION: Status post intubation with satisfactory position radiographically of the endotracheal tube. Enteric catheter with sidehole slightly past the gastroesophageal junction. Low lung volumes. Electronically Signed   By: Fidela Salisbury M.D.   On: 06/08/2015 19:04   I have personally reviewed and evaluated these images and lab results as part of my medical decision-making.  MDM   Final diagnoses:  Respiratory distress  Endotracheally intubated  Hyperglycemia   Patient is a 59 year old African American male who presents in respiratory distress by EMS. Patient is hypoxic to the 80s with a GCS less than 7. Decision was made to intubate for airway protection. On inspection with direct laryngoscopy there is a large tissue-like structure in the patient's posterior oropharynx obstructing his airway. Is able to place the tube through the vocal cords and removed the tissue mass. Fill that this was the  reason for his respiratory failure. Will admit to critical care. Labs, CXR, EKG obtained. ETT in appropriate position.   I independently reviewed and interpreted available labs and imaging studies. Hyperglycemia to 800 with normal gap and normal bicarb. Doubt DKA. Also hypernatremic to 150. Lactate 4. Giving IVFs. Taken to ICU.  Discussed with Dr. Thomasene Lot.   Gustavus Bryant, MD 06/08/15 2159  [place], MD 06/08/15 2224

## 2015-06-08 NOTE — ED Notes (Signed)
Tissue pulled from trachea by MD while intubating pt

## 2015-06-08 NOTE — Progress Notes (Signed)
[facility] Progress Note Patient Name: Justin [name] DOB: 04/28/1956 MRN: [id number]   Date of Service  06/08/2015  HPI/Events of Note  Seizure  eICU Interventions  Increase propofol     Intervention Category Major Interventions: Other:  Diora Bellizzi 06/08/2015, 9:55 PM

## 2015-06-08 NOTE — H&P (Signed)
PULMONARY / CRITICAL CARE MEDICINE   Name: Justin [name] MRN: [id number] DOB: 06/07/1956    ADMISSION DATE:  06/08/2015 CONSULTATION DATE:  06/08/15  REFERRING MD:  EDP  CHIEF COMPLAINT:  Agonal breathing  HISTORY OF PRESENT ILLNESS:  Pt is encephelopathic; therefore, this HPI is obtained from chart review. Justin [name] is a 59 y.o. 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  He was [facility] evening of 12/5 after staff at SNF found him agonally breathing since 3 PM.  In ED, he was completely unresponsive and agonal.  He subsequently required intubation for airway protection.  During intubation, a piece of tissue paper was removed from pt's posterior pharynx.  He had recent admission 04/05/15 through 04/10/15 for urosepsis.   PAST MEDICAL HISTORY :  He  has a past medical history of Hypertension; Dyslipidemia; Carpal tunnel syndrome; A-V fistula ([place]); Anemia; Hemorrhoids; Hyperparathyroidism; Intracranial hemorrhage ([place]); Hypotension; Hyperkalemia; Kidney disease; Sepsis(995.91); GI bleeding; DVT (deep venous thrombosis) (HCC); PVD (peripheral vascular disease) ([place]); DM (diabetes mellitus) ([place]); Cellulitis; IV infiltration; PPD positive; Intracerebral bleed ([place]); Hypokinesis; Gout; and Renal transplant, status post (10/19/2014).  PAST SURGICAL HISTORY: He  has past surgical history that includes Kidney transplant; AV fistula placement; Peripherally inserted central catheter insertion; Amputation; Gastrostomy; IVC filter; Brain Biopsy; and #6 Shiley Cuff.  Allergies  Allergen Reactions  . Amoxicillin Other (See Comments)    ON MAR  . Ampicillin Other (See Comments)    ON MAR  . Latex Other (See Comments)    ON MAR  . Morphine And Related  Other (See Comments)    UNK  . Penicillins Other (See Comments)    ON MAR; tolerated Ceftriaxone 8/27-9/1  . Tape Other (See Comments)    ON MAR    No current facility-administered medications on file prior to encounter.   Current Outpatient Prescriptions on File Prior to Encounter  Medication Sig  . allopurinol (ZYLOPRIM) 100 MG tablet Place 100 mg into feeding tube daily.   Marland Kitchen amLODipine (NORVASC) 5 MG tablet Take 1 tablet (5 mg total) by mouth daily. (Patient taking differently: Place 5 mg into feeding tube daily. )  . aspirin 81 MG tablet Place 81 mg into feeding tube daily.   Marland Kitchen atorvastatin (LIPITOR) 10 MG tablet Place 10 mg into feeding tube daily.   . citalopram (CELEXA) 10 MG tablet Place 20 mg into feeding tube daily.   . Cranberry 425 MG CAPS Place 425 mg into feeding tube daily.   Marland Kitchen Dextromethorphan-Quinidine 20-10 MG CAPS 1 capsule by PEG Tube route every 12 (twelve) hours.   . fentaNYL (DURAGESIC - DOSED MCG/HR) 25 MCG/HR patch Place 25 mcg onto the skin every 3 (three) days.  Marland Kitchen gabapentin (NEURONTIN) 300 MG capsule Place 300 mg into feeding tube 3 (three) times daily.   . hydrALAZINE (APRESOLINE) 25 MG tablet 25 mg by PEG Tube route 2 (two) times daily.   . insulin glargine (LANTUS) 100 UNIT/ML injection Inject 0.1 mLs (10 Units total) into the skin at bedtime. (Patient taking differently: Inject 14 Units into the skin at bedtime. )  . insulin lispro (HUMALOG KWIKPEN) 100 UNIT/ML KiwkPen Inject 9 Units into the skin 3 (three) times daily before meals.  . isosorbide dinitrate (ISORDIL) 10 MG  tablet 10 mg by PEG Tube route 2 (two) times daily.   Marland Kitchen lamoTRIgine (LAMICTAL) 25 MG tablet Take 50 mg by mouth daily.  Marland Kitchen levETIRAcetam (KEPPRA) 100 MG/ML solution Place 5 mLs (500 mg total) into feeding tube 2 (two) times daily.  Marland Kitchen loperamide (IMODIUM A-D) 2 MG tablet Take 2 mg by mouth daily as needed for diarrhea or loose stools. Max 16 mg per day  . LORazepam (ATIVAN) 0.5 MG tablet  Take 1 tablet (0.5 mg total) by mouth every 8 (eight) hours as needed. Anxiety  . omeprazole (PRILOSEC) 20 MG capsule Place 20 mg into feeding tube daily.   . ondansetron (ZOFRAN) 4 MG tablet Take 4 mg by mouth every 8 (eight) hours as needed for nausea or vomiting.  Marland Kitchen oxyCODONE-acetaminophen (PERCOCET) 7.5-325 MG tablet Take 1 tablet by mouth every 4 (four) hours as needed for severe pain.  . potassium & sodium phosphates (PHOS-NAK) 280-160-250 MG PACK Place 1 packet into feeding tube 4 (four) times daily.   . predniSONE (DELTASONE) 5 MG tablet Place 5 mg into feeding tube daily.   . promethazine (PHENERGAN) 25 MG/ML injection Inject 25 mg into the vein every 6 (six) hours as needed for nausea or vomiting.  . senna-docusate (SENOKOT-S) 8.6-50 MG per tablet 1 tablet by PEG Tube route daily.   . sucralfate (CARAFATE) 1 GM/10ML suspension Place 1 g into feeding tube every 8 (eight) hours.   . tacrolimus (PROGRAF) 0.5 MG capsule Take 0.5 mg by mouth 2 (two) times daily.  . tacrolimus (PROGRAF) 1 MG capsule 3 mg by PEG Tube route 2 (two) times daily.   Marland Kitchen tiZANidine (ZANAFLEX) 2 MG tablet Place 2 mg into feeding tube 2 (two) times daily.   . Vitamin D, Ergocalciferol, (DRISDOL) 50000 UNITS CAPS Place 50,000 Units into feeding tube every 30 (thirty) days.   . Water For Irrigation, Sterile (FREE WATER) SOLN Place 200 mLs into feeding tube every 8 (eight) hours.  . Nutritional Supplements (FEEDING SUPPLEMENT, GLUCERNA 1.2 CAL,) LIQD Place 1,000 mLs into feeding tube continuous. (Patient not taking: Reported on 04/05/2015)    FAMILY HISTORY:  His indicated that his mother is deceased. He indicated that his father is deceased.   SOCIAL HISTORY: He  reports that he quit smoking about 14 years ago. He does not have any smokeless tobacco history on file. He reports that he uses illicit drugs. He reports that he does not drink alcohol.  REVIEW OF SYSTEMS:   Unable to obtain as pt is  encephalopathic.  SUBJECTIVE:  On vent  VITAL SIGNS: BP 113/74 mmHg  Pulse 116  Resp 18  Ht 6' (1.829 m)  Wt 80.8 kg (178 lb 2.1 oz)  BMI 24.15 kg/m2  SpO2 100%  HEMODYNAMICS:    VENTILATOR SETTINGS: Vent Mode:  [-] PRVC FiO2 (%):  [100 %] 100 % Set Rate:  [18 bmp] 18 bmp Vt Set:  RW:[id number] mL] 620 mL PEEP:  [5 cmH20] 5 cmH20 Plateau Pressure:  [26 cmH20] 26 cmH20  INTAKE / OUTPUT:    PHYSICAL EXAMINATION: General: Chronically ill appearing male, in NAD. Neuro: Sedated.  Does not follow commands.  HEENT: [place]/AT. PERRL. Cardiovascular: RRR, no M/R/G.  Lungs: Respirations even and unlabored.  Coarse bilaterally. Abdomen: PEG in place.  BS x 4, soft, NT/ND.  Musculoskeletal: No gross deformities, no edema.  Skin: Intact, warm, no rashes. Bilateral UE AV fistulas with no bruit or thrill .   LABS:  BMET  Recent Labs Lab 06/08/15 1840  NA 150*  K 5.9*  CL 107  CO2 30  BUN 69*  CREATININE 3.36*  GLUCOSE 867*    Electrolytes  Recent Labs Lab 06/08/15 1840  CALCIUM 11.3*    CBC  Recent Labs Lab 06/08/15 1840  WBC 18.0*  HGB 18.0*  HCT 61.7*  PLT 220    Coag's No results for input(s): APTT, INR in the last 168 hours.  Sepsis Markers  Recent Labs Lab 06/08/15 1913  LATICACIDVEN 4.11*    ABG No results for input(s): PHART, PCO2ART, PO2ART in the last 168 hours.  Liver Enzymes  Recent Labs Lab 06/08/15 1840  AST 44*  ALT 30  ALKPHOS 91  BILITOT 1.1  ALBUMIN 3.2*    Cardiac Enzymes No results for input(s): TROPONINI, PROBNP in the last 168 hours.  Glucose No results for input(s): GLUCAP in the last 168 hours.  Imaging Dg Chest Portable 1 View  06/08/2015  CLINICAL DATA:  Unresponsive patient.  Status post intubation. EXAM: PORTABLE CHEST 1 VIEW COMPARISON:  04/08/2015 FINDINGS: There has been an intubation. The endotracheal tube overlies the tracheal air column and terminates at the level of the clavicular heads. Enteric catheter  is also seen, tip overlying the expected location of gastric cardia, side hole slightly past the expected location of the gastroesophageal junction. Cardiomediastinal silhouette is normal. Mediastinal contours appear intact. There is no evidence of focal airspace consolidation, pleural effusion or pneumothorax. Lungs are low volume. Osseous structures are without acute abnormality. Soft tissues are grossly normal. IMPRESSION: Status post intubation with satisfactory position radiographically of the endotracheal tube. Enteric catheter with sidehole slightly past the gastroesophageal junction. Low lung volumes. Electronically Signed   By: Fidela Salisbury M.D.   On: 06/08/2015 19:04     STUDIES:  CXR 12/5 > Low volumes. CT head 12/5 >  CULTURES: Blood 12/5 > Sputum 12/5 > Urine 12/5 >  ANTIBIOTICS: Vanc 12/5 > Aztreonam 12/5 > 12/6 Imipenem>>>  SIGNIFICANT EVENTS: 121/5 > admitted after agonal breathing, found to have tissue paper in pharynx.  Intubated.  LINES/TUBES: ETT 12/5 > PEG (chronic) 12/5 >  DISCUSSION: 59 year old male, SNF resident with complicated medical hx, presented to [facility] ED 12/5 with AMS after several hours of "agonal breathing". Suspect there could possibly be an element of aspiration as he has a history of dysphagia and was discharged to SNF with a diet. A tissue was also found in posterior pharynx during intubation. However, I think that this is more likely dehydration based on AKI, hemoconcentration, hypernatremia, and hyperglycemia. 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 Will hydrate somewhat aggressively, treat empirically for infection, and initiate insulin gtt. Will also hold sedating outpatient meds, of which he takes several. Trend troponin and repeat EKG due to elevated troponin in ED.   ASSESSMENT / PLAN:  PULMONARY A: Acute hypoxemic respiratory failure secondary to suspected aspiration pneumonitis   P:   Full vent support CXR in AM Follow ABG Vent  bundle  CARDIOVASCULAR A:  Elevated troponin - likely demand H/o HTN  P:  Telemetry Hold home hydralazine, isosorbide, amlodipine Continue home ASA, atorvastatin  Repeat EKG Trend troponin  RENAL A:   Acute on CKD status post cadaveric renal transplant, not-on HD.  Dehydration Hyperkalemia Hypernatremia  P:   Calcium, insulin Free water flushes 164mL q 6 hours.  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 Likely multifactorial in setting resp failure, hyperglycemia, sedating meds.  H/o ICH Depression Anxiety Pseudobulbar affect H/o unspecified convusions  P:   RASS goal: 0 Propofol, wean as able WUA in AM Holding home, lamictal, gabapentin, fentanyl patch, tizanidine, nuedexta, celexa Continue home Keppra    FAMILY  - Updates: no family available  - Inter-disciplinary family meet or Palliative Care meeting due by:  12/12  APP Critical Care time: 87 mins  Georgann Housekeeper, AGACNP-BC [place] [facility]/Critical Care Pager [phone] or [phone]  06/08/2015 9:06 PM   STAFF NOTE: Linwood Dibbles, MD FACP have personally reviewed patient's available data, including medical history, events of note, physical examination and test results as part of my evaluation. I  have discussed with resident/NP and other care providers such as pharmacist, RN and RRT. In addition, I personally evaluated patient and elicited key findings of: immunocompromised state, concern will be blossoming infiltrate with tissue in airway, coarse BS, LActic is finally coming down, for eeg, i am concerned on using just aztreonam, risk benefit ratio favors Imipenem over aztreonam , allergies unclear, ABG reviewed, reduce MV, rate, Tv by 1 cc.kg, change fluids to 1/2 NS, he is horribly hypovolemic, maintain vanc, will need renal to see in am for management prograff, may need stress roids, insulin drip required, with h/o lym,phoma, and lactic noted, with normal LFT, may need CT abdo/pelvis, would assess follow up LA with volume, need pall discussions, aggressive measures would be futile The patient is critically ill with multiple organ systems failure and requires high complexity decision making for assessment and support, frequent evaluation and titration of therapies, application of advanced monitoring technologies and extensive interpretation of multiple databases.   Critical Care Time devoted to patient care services described in this note is 30 Minutes. This time reflects time of care of this signee: Merrie Roof, MD FACP. This critical care time does not reflect procedure time, or teaching time or supervisory time of PA/NP/Med student/Med Resident etc but could involve care discussion time. Rest per NP/medical resident whose note is outlined above and that I agree with   Lavon Paganini. Titus Mould, MD, [place] Pgr: [place] [facility] 06/09/2015 5:55 AM

## 2015-06-08 NOTE — ED Notes (Signed)
PA Shearon Stalls states to leave propofol drip running

## 2015-06-08 NOTE — Progress Notes (Signed)
[facility] Progress Note Patient Name: Justin [name] DOB: 1956/01/08 MRN: [id number]   Date of Service  06/08/2015  HPI/Events of Note  Shock> rising lactic acid, has only received ~1.5 L Hyperglycemia hypertriglyceridemia  eICU Interventions  Check amylase, lipase Caution with propofol, monitor tryglycerides Bolus saline 1 L now, repeat lactic acid     Intervention Category Major Interventions: Shock - evaluation and management;Hyperglycemia - active titration of insulin therapy Intermediate Interventions: Diagnostic test evaluation  Simonne Maffucci 06/08/2015, 11:46 PM

## 2015-06-08 NOTE — ED Notes (Signed)
This RN called over to CT to determine when patient can be transported to CT. They state pt is next to be taken to CT.

## 2015-06-08 NOTE — ED Notes (Addendum)
PA Desai at bedside.

## 2015-06-09 ENCOUNTER — Inpatient Hospital Stay (HOSPITAL_COMMUNITY): Payer: Medicare Other

## 2015-06-09 DIAGNOSIS — N189 Chronic kidney disease, unspecified: Secondary | ICD-10-CM | POA: Diagnosis present

## 2015-06-09 DIAGNOSIS — Z978 Presence of other specified devices: Secondary | ICD-10-CM | POA: Diagnosis present

## 2015-06-09 DIAGNOSIS — J9601 Acute respiratory failure with hypoxia: Secondary | ICD-10-CM | POA: Diagnosis present

## 2015-06-09 DIAGNOSIS — R739 Hyperglycemia, unspecified: Secondary | ICD-10-CM | POA: Diagnosis present

## 2015-06-09 DIAGNOSIS — R569 Unspecified convulsions: Secondary | ICD-10-CM | POA: Diagnosis present

## 2015-06-09 DIAGNOSIS — G9340 Encephalopathy, unspecified: Secondary | ICD-10-CM | POA: Diagnosis present

## 2015-06-09 DIAGNOSIS — E1101 Type 2 diabetes mellitus with hyperosmolarity with coma: Secondary | ICD-10-CM | POA: Diagnosis present

## 2015-06-09 LAB — GLUCOSE, CAPILLARY
GLUCOSE-CAPILLARY: 100 mg/dL — AB (ref 65–99)
GLUCOSE-CAPILLARY: 108 mg/dL — AB (ref 65–99)
GLUCOSE-CAPILLARY: 138 mg/dL — AB (ref 65–99)
GLUCOSE-CAPILLARY: 143 mg/dL — AB (ref 65–99)
GLUCOSE-CAPILLARY: 215 mg/dL — AB (ref 65–99)
GLUCOSE-CAPILLARY: 229 mg/dL — AB (ref 65–99)
GLUCOSE-CAPILLARY: 276 mg/dL — AB (ref 65–99)
GLUCOSE-CAPILLARY: 314 mg/dL — AB (ref 65–99)
GLUCOSE-CAPILLARY: 395 mg/dL — AB (ref 65–99)
GLUCOSE-CAPILLARY: 408 mg/dL — AB (ref 65–99)
GLUCOSE-CAPILLARY: 460 mg/dL — AB (ref 65–99)
GLUCOSE-CAPILLARY: 490 mg/dL — AB (ref 65–99)
GLUCOSE-CAPILLARY: 490 mg/dL — AB (ref 65–99)
Glucose-Capillary: 115 mg/dL — ABNORMAL HIGH (ref 65–99)
Glucose-Capillary: 414 mg/dL — ABNORMAL HIGH (ref 65–99)
Glucose-Capillary: 536 mg/dL — ABNORMAL HIGH (ref 65–99)
Glucose-Capillary: 92 mg/dL (ref 65–99)
Glucose-Capillary: 93 mg/dL (ref 65–99)
Glucose-Capillary: 94 mg/dL (ref 65–99)
Glucose-Capillary: 95 mg/dL (ref 65–99)
Glucose-Capillary: 96 mg/dL (ref 65–99)
Glucose-Capillary: 96 mg/dL (ref 65–99)
Glucose-Capillary: >600 mg/dL (ref 65–99)
Glucose-Capillary: >600 mg/dL (ref 65–99)

## 2015-06-09 LAB — CBC
HEMATOCRIT: 50.2 % (ref 39.0–52.0)
HEMOGLOBIN: 15 g/dL (ref 13.0–17.0)
MCH: 27 pg (ref 26.0–34.0)
MCHC: 29.9 g/dL — ABNORMAL LOW (ref 30.0–36.0)
MCV: 90.5 fL (ref 78.0–100.0)
Platelets: 147 10*3/uL — ABNORMAL LOW (ref 150–400)
RBC: 5.55 MIL/uL (ref 4.22–5.81)
RDW: 14.5 % (ref 11.5–15.5)
WBC: 11.5 10*3/uL — ABNORMAL HIGH (ref 4.0–10.5)

## 2015-06-09 LAB — BASIC METABOLIC PANEL
ANION GAP: 13 (ref 5–15)
ANION GAP: 12 (ref 5–15)
Anion gap: 17 — ABNORMAL HIGH (ref 5–15)
Anion gap: 13 (ref 5–15)
Anion gap: 14 (ref 5–15)
BUN: 59 mg/dL — ABNORMAL HIGH (ref 6–20)
BUN: 64 mg/dL — AB (ref 6–20)
BUN: 66 mg/dL — AB (ref 6–20)
BUN: 66 mg/dL — AB (ref 6–20)
BUN: 72 mg/dL — AB (ref 6–20)
CALCIUM: 10.2 mg/dL (ref 8.9–10.3)
CALCIUM: 10.1 mg/dL (ref 8.9–10.3)
CALCIUM: 10.2 mg/dL (ref 8.9–10.3)
CHLORIDE: 111 mmol/L (ref 101–111)
CHLORIDE: 116 mmol/L — AB (ref 101–111)
CHLORIDE: 118 mmol/L — AB (ref 101–111)
CO2: 24 mmol/L (ref 22–32)
CO2: 18 mmol/L — AB (ref 22–32)
CO2: 19 mmol/L — AB (ref 22–32)
CO2: 20 mmol/L — AB (ref 22–32)
CO2: 21 mmol/L — AB (ref 22–32)
CREATININE: 2.51 mg/dL — AB (ref 0.61–1.24)
CREATININE: 2.98 mg/dL — AB (ref 0.61–1.24)
CREATININE: 3.1 mg/dL — AB (ref 0.61–1.24)
CREATININE: 3.18 mg/dL — AB (ref 0.61–1.24)
Calcium: 10.2 mg/dL (ref 8.9–10.3)
Calcium: 10 mg/dL (ref 8.9–10.3)
Chloride: 119 mmol/L — ABNORMAL HIGH (ref 101–111)
Chloride: 120 mmol/L — ABNORMAL HIGH (ref 101–111)
Creatinine, Ser: 2.83 mg/dL — ABNORMAL HIGH (ref 0.61–1.24)
GFR calc Af Amer: 23 mL/min — ABNORMAL LOW (ref >60)
GFR calc Af Amer: 23 mL/min — ABNORMAL LOW (ref >60)
GFR calc Af Amer: 26 mL/min — ABNORMAL LOW (ref >60)
GFR calc Af Amer: 30 mL/min — ABNORMAL LOW (ref >60)
GFR calc non Af Amer: 20 mL/min — ABNORMAL LOW (ref >60)
GFR calc non Af Amer: 20 mL/min — ABNORMAL LOW (ref >60)
GFR calc non Af Amer: 21 mL/min — ABNORMAL LOW (ref >60)
GFR calc non Af Amer: 23 mL/min — ABNORMAL LOW (ref >60)
GFR, EST AFRICAN AMERICAN: 25 mL/min — AB (ref >60)
GFR, EST NON AFRICAN AMERICAN: 26 mL/min — AB (ref >60)
GLUCOSE: 620 mg/dL — AB (ref 65–99)
GLUCOSE: 497 mg/dL — AB (ref 65–99)
GLUCOSE: 84 mg/dL (ref 65–99)
Glucose, Bld: 876 mg/dL (ref 65–99)
Glucose, Bld: 764 mg/dL (ref 65–99)
POTASSIUM: 3.8 mmol/L (ref 3.5–5.1)
POTASSIUM: 3.8 mmol/L (ref 3.5–5.1)
POTASSIUM: 5 mmol/L (ref 3.5–5.1)
Potassium: 3.5 mmol/L (ref 3.5–5.1)
Potassium: 3.1 mmol/L — ABNORMAL LOW (ref 3.5–5.1)
SODIUM: 150 mmol/L — AB (ref 135–145)
Sodium: 149 mmol/L — ABNORMAL HIGH (ref 135–145)
Sodium: 149 mmol/L — ABNORMAL HIGH (ref 135–145)
Sodium: 151 mmol/L — ABNORMAL HIGH (ref 135–145)
Sodium: 156 mmol/L — ABNORMAL HIGH (ref 135–145)

## 2015-06-09 LAB — BLOOD GAS, ARTERIAL
ACID-BASE DEFICIT: 3.7 mmol/L — AB (ref 0.0–2.0)
Bicarbonate: 18.5 mEq/L — ABNORMAL LOW (ref 20.0–24.0)
DRAWN BY: 345601
FIO2: 0.6
O2 SAT: 99.4 %
PEEP/CPAP: 5 cmH2O
PH ART: 7.547 — AB (ref 7.350–7.450)
Patient temperature: 98.6
RATE: 15 resp/min
TCO2: 19.2 mmol/L (ref 0–100)
VT: 620 mL
pCO2 arterial: 21.3 mmHg — ABNORMAL LOW (ref 35.0–45.0)
pO2, Arterial: 160 mmHg — ABNORMAL HIGH (ref 80.0–100.0)

## 2015-06-09 LAB — LACTIC ACID, PLASMA
Lactic Acid, Venous: 6.2 mmol/L (ref 0.5–2.0)
Lactic Acid, Venous: 5.5 mmol/L (ref 0.5–2.0)
Lactic Acid, Venous: 4.1 mmol/L (ref 0.5–2.0)
Lactic Acid, Venous: 3.8 mmol/L (ref 0.5–2.0)

## 2015-06-09 LAB — PROCALCITONIN: PROCALCITONIN: 0.7 ng/mL

## 2015-06-09 LAB — CREATININE, URINE, RANDOM: Creatinine, Urine: 98.36 mg/dL

## 2015-06-09 LAB — SODIUM, URINE, RANDOM: Sodium, Ur: 10 mmol/L

## 2015-06-09 LAB — TROPONIN I
TROPONIN I: 0.13 ng/mL — AB (ref <0.031)
TROPONIN I: 0.14 ng/mL — AB (ref <0.031)

## 2015-06-09 LAB — MAGNESIUM: MAGNESIUM: 2.5 mg/dL — AB (ref 1.7–2.4)

## 2015-06-09 LAB — PHOSPHORUS: Phosphorus: 1.1 mg/dL — ABNORMAL LOW (ref 2.5–4.6)

## 2015-06-09 LAB — AMMONIA: Ammonia: 30 umol/L (ref 9–35)

## 2015-06-09 LAB — TRIGLYCERIDES: TRIGLYCERIDES: 171 mg/dL — AB (ref <150)

## 2015-06-09 LAB — OSMOLALITY: Osmolality: 393 mOsm/kg (ref 275–295)

## 2015-06-09 LAB — MRSA PCR SCREENING: MRSA by PCR: NEGATIVE

## 2015-06-09 LAB — AMYLASE: Amylase: 142 U/L — ABNORMAL HIGH (ref 28–100)

## 2015-06-09 LAB — LIPASE, BLOOD: Lipase: 52 U/L — ABNORMAL HIGH (ref 11–51)

## 2015-06-09 MED ORDER — GABAPENTIN 300 MG PO CAPS
300.0000 mg | ORAL_CAPSULE | Freq: Three times a day (TID) | ORAL | Status: DC
  Administered 2015-06-09 – 2015-06-11 (×6): 300 mg
  Filled 2015-06-09 (×6): qty 1

## 2015-06-09 MED ORDER — CHLORHEXIDINE GLUCONATE 0.12% ORAL RINSE (MEDLINE KIT)
15.0000 mL | Freq: Two times a day (BID) | OROMUCOSAL | Status: DC
  Administered 2015-06-09 – 2015-06-16 (×16): 15 mL via OROMUCOSAL

## 2015-06-09 MED ORDER — VANCOMYCIN HCL IN DEXTROSE 750-5 MG/150ML-% IV SOLN
750.0000 mg | INTRAVENOUS | Status: DC
  Administered 2015-06-09 – 2015-06-10 (×2): 750 mg via INTRAVENOUS
  Filled 2015-06-09 (×2): qty 150

## 2015-06-09 MED ORDER — PROPOFOL 1000 MG/100ML IV EMUL
0.0000 ug/kg/min | INTRAVENOUS | Status: DC
  Administered 2015-06-09: 5 ug/kg/min via INTRAVENOUS
  Administered 2015-06-09: 15 ug/kg/min via INTRAVENOUS
  Administered 2015-06-10: 40 ug/kg/min via INTRAVENOUS
  Administered 2015-06-10: 30 ug/kg/min via INTRAVENOUS
  Administered 2015-06-10 (×2): 50 ug/kg/min via INTRAVENOUS
  Administered 2015-06-11 – 2015-06-12 (×6): 40 ug/kg/min via INTRAVENOUS
  Filled 2015-06-09 (×12): qty 100

## 2015-06-09 MED ORDER — SODIUM CHLORIDE 0.9 % IV SOLN
25.0000 ug/h | INTRAVENOUS | Status: DC
  Administered 2015-06-09 (×2): 100 ug/h via INTRAVENOUS
  Filled 2015-06-09 (×2): qty 50

## 2015-06-09 MED ORDER — MIDAZOLAM HCL 2 MG/2ML IJ SOLN
INTRAMUSCULAR | Status: AC
  Administered 2015-06-09: 2 mg
  Filled 2015-06-09: qty 2

## 2015-06-09 MED ORDER — MIDAZOLAM HCL 2 MG/2ML IJ SOLN
1.0000 mg | INTRAMUSCULAR | Status: DC | PRN
  Administered 2015-06-09 – 2015-06-19 (×2): 2 mg via INTRAVENOUS
  Filled 2015-06-09 (×3): qty 2

## 2015-06-09 MED ORDER — PRO-STAT SUGAR FREE PO LIQD
30.0000 mL | Freq: Two times a day (BID) | ORAL | Status: DC
  Administered 2015-06-09 – 2015-06-23 (×29): 30 mL
  Filled 2015-06-09 (×31): qty 30

## 2015-06-09 MED ORDER — ASPIRIN 81 MG PO CHEW
81.0000 mg | CHEWABLE_TABLET | Freq: Every day | ORAL | Status: DC
  Administered 2015-06-09 – 2015-06-23 (×15): 81 mg
  Filled 2015-06-09 (×15): qty 1

## 2015-06-09 MED ORDER — POTASSIUM PHOSPHATES 15 MMOLE/5ML IV SOLN
10.0000 mmol | Freq: Once | INTRAVENOUS | Status: AC
  Administered 2015-06-09: 10 mmol via INTRAVENOUS
  Filled 2015-06-09: qty 3.33

## 2015-06-09 MED ORDER — VITAL HIGH PROTEIN PO LIQD: 1000.0000 mL | ORAL | Status: DC

## 2015-06-09 MED ORDER — MIDAZOLAM HCL 2 MG/2ML IJ SOLN
INTRAMUSCULAR | Status: AC
  Administered 2015-06-09: 2 mg via INTRAVENOUS
  Filled 2015-06-09: qty 2

## 2015-06-09 MED ORDER — SODIUM CHLORIDE 0.9 % IV SOLN
250.0000 mg | Freq: Four times a day (QID) | INTRAVENOUS | Status: DC
  Administered 2015-06-09 – 2015-06-10 (×5): 250 mg via INTRAVENOUS
  Filled 2015-06-09 (×8): qty 250

## 2015-06-09 MED ORDER — MIDAZOLAM HCL 10 MG/2ML IJ SOLN
1.0000 mg | INTRAMUSCULAR | Status: DC | PRN
  Administered 2015-06-09: 2 mg via INTRAVENOUS
  Filled 2015-06-09: qty 0.4

## 2015-06-09 MED ORDER — GLUCERNA 1.2 CAL PO LIQD
1000.0000 mL | ORAL | Status: DC
  Administered 2015-06-09 – 2015-06-23 (×13): 1000 mL
  Filled 2015-06-09 (×29): qty 1000

## 2015-06-09 MED ORDER — SODIUM CHLORIDE 0.45 % IV SOLN
INTRAVENOUS | Status: DC
  Administered 2015-06-09: 1000 mL via INTRAVENOUS
  Administered 2015-06-09 – 2015-06-11 (×4): via INTRAVENOUS

## 2015-06-09 MED ORDER — FENTANYL CITRATE (PF) 100 MCG/2ML IJ SOLN
50.0000 ug | INTRAMUSCULAR | Status: DC | PRN
  Administered 2015-06-09 – 2015-06-12 (×12): 100 ug via INTRAVENOUS
  Administered 2015-06-14: 50 ug via INTRAVENOUS
  Administered 2015-06-15 – 2015-06-16 (×5): 100 ug via INTRAVENOUS
  Filled 2015-06-09 (×3): qty 2
  Filled 2015-06-09: qty 4
  Filled 2015-06-09 (×9): qty 2
  Filled 2015-06-09: qty 4
  Filled 2015-06-09 (×3): qty 2
  Filled 2015-06-09: qty 4

## 2015-06-09 MED ORDER — ANTISEPTIC ORAL RINSE SOLUTION (CORINZ)
7.0000 mL | OROMUCOSAL | Status: DC
  Administered 2015-06-09 – 2015-06-16 (×76): 7 mL via OROMUCOSAL

## 2015-06-09 NOTE — Progress Notes (Signed)
abg collected  

## 2015-06-09 NOTE — Progress Notes (Signed)
eLink Physician-Brief Progress Note Patient Name: KALUP KLIMAS DOB: 10-Mar-1956 MRN: [id number]   Date of Service  06/09/2015  HPI/Events of Note  Seizure disorder, but no witnessed seizures other than several hours ago when propofol was increased Lactic acid not changing despite normal BP and normal UOP, suspect lactic acid is medication related (propofol)  eICU Interventions  Stop propofol Repeat lactic acid EEG for AM Start fentanyl gtt     Intervention Category Major Interventions: Seizures - evaluation and management Intermediate Interventions: Diagnostic test evaluation  Simonne Maffucci 06/09/2015, 1:22 AM

## 2015-06-09 NOTE — Progress Notes (Signed)
ANTIBIOTIC CONSULT NOTE - INITIAL  Pharmacy Consult for Vancocin and Primaxin Indication: HCAP vs aspiration  Allergies  Allergen Reactions  . Amoxicillin Other (See Comments)    ON MAR  . Ampicillin Other (See Comments)    ON MAR  . Latex Other (See Comments)    ON MAR  . Morphine And Related Other (See Comments)    UNK  . Penicillins Other (See Comments)    ON MAR; tolerated Ceftriaxone 8/27-9/1  . Tape Other (See Comments)    ON MAR    Patient Measurements: Height: 6' (182.9 cm) Weight: 164 lb 10.9 oz (74.7 kg) IBW/kg (Calculated) : 77.6  Vital Signs: Temp: 97.5 F (36.4 C) (12/06 0409) Temp Source: Axillary (12/06 0409) BP: 126/61 mmHg (12/06 0400) Pulse Rate: 83 (12/06 0400)  Labs:  Recent Labs  06/08/15 1840  06/09/15 0030 06/09/15 0214 06/09/15 0436  WBC 18.0*  --   --   --  11.5*  HGB 18.0*  --   --   --  15.0  PLT 220  --   --   --  147*  CREATININE 3.36*  < > 3.18* 3.10* 2.98*  < > = values in this interval not displayed. Estimated Creatinine Clearance: 27.5 mL/min (by C-G formula based on Cr of 2.98).   Microbiology: Recent Results (from the past 720 hour(s))  Culture, blood (routine x 2)     Status: None (Preliminary result)   Collection Time: 06/08/15  6:40 PM  Result Value Ref Range Status   Specimen Description BLOOD RIGHT HAND  Final   Special Requests BOTTLES DRAWN AEROBIC ONLY [place]  Final   Culture PENDING  Incomplete   Report Status PENDING  Incomplete  MRSA PCR Screening     Status: None   Collection Time: 06/09/15  4:41 AM  Result Value Ref Range Status   MRSA by PCR NEGATIVE NEGATIVE Final    Comment:        The GeneXpert MRSA Assay (FDA approved for NASAL specimens only), is one component of a comprehensive MRSA colonization surveillance program. It is not intended to diagnose MRSA infection nor to guide or monitor treatment for MRSA infections.     Medical History: Past Medical History  Diagnosis Date  . Hypertension    . Dyslipidemia   . Carpal tunnel syndrome   . A-V fistula ([place])   . Anemia   . Hemorrhoids   . Hyperparathyroidism   . Intracranial hemorrhage ([place])   . Hypotension   . Hyperkalemia   . Kidney disease   . Sepsis(995.91)   . GI bleeding   . DVT (deep venous thrombosis) ([place])   . PVD (peripheral vascular disease) ([place])   . DM (diabetes mellitus) ([place])   . Cellulitis   . IV infiltration     Right upper extremity  . PPD positive   . 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 Hypokinesis     EF of 60% with mild anterior hypokinesis, minimal coronary artery disease on catheterization in December of 2007  . Gout   . Renal transplant, status post 10/19/2014  . Pseudobulbar affect   . GERD (gastroesophageal reflux disease)     Medications:  Prescriptions prior to admission  Medication Sig Dispense Refill Last Dose  . allopurinol (ZYLOPRIM) 100 MG tablet Place 100 mg into feeding tube daily.    06/08/2015 at Unknown time  . amLODipine (NORVASC) 5 MG tablet Take 1 tablet (  5 mg total) by mouth daily. (Patient taking differently: Place 5 mg into feeding tube daily. )   06/08/2015 at Unknown time  . aspirin 81 MG tablet Place 81 mg into feeding tube daily.    06/08/2015 at Unknown time  . atorvastatin (LIPITOR) 10 MG tablet Place 10 mg into feeding tube daily.    06/08/2015 at Unknown time  . cefTRIAXone (ROCEPHIN) 1 G injection Inject 1 g into the muscle once. For aspiration   06/08/2015 at 1648  . citalopram (CELEXA) 10 MG tablet Place 20 mg into feeding tube daily.    06/08/2015 at Unknown time  . Cranberry 425 MG CAPS Place 425 mg into feeding tube daily.    06/08/2015 at Unknown time  . Dextromethorphan-Quinidine 20-10 MG CAPS 1 capsule by PEG Tube route every 12 (twelve) hours.    06/08/2015 at Unknown time  . fentaNYL (DURAGESIC - DOSED MCG/HR) 25 MCG/HR patch Place 25 mcg onto the skin every 3 (three) days.   06/08/2015 at Unknown time  .  gabapentin (NEURONTIN) 300 MG capsule Place 300 mg into feeding tube 3 (three) times daily.    06/08/2015 at Unknown time  . hydrALAZINE (APRESOLINE) 25 MG tablet 25 mg by PEG Tube route 2 (two) times daily.    06/08/2015 at Unknown time  . insulin glargine (LANTUS) 100 UNIT/ML injection Inject 0.1 mLs (10 Units total) into the skin at bedtime. (Patient taking differently: Inject 14 Units into the skin at bedtime. ) 10 mL 11 06/07/2015 at Unknown time  . insulin lispro (HUMALOG KWIKPEN) 100 UNIT/ML KiwkPen Inject 9 Units into the skin 3 (three) times daily before meals.   06/08/2015 at Unknown time  . ipratropium-albuterol (DUONEB) 0.5-2.5 (3) MG/3ML SOLN Take 3 mLs by nebulization 4 (four) times daily.   unk  . isosorbide dinitrate (ISORDIL) 10 MG tablet 10 mg by PEG Tube route 2 (two) times daily.    06/08/2015 at Unknown time  . lamoTRIgine (LAMICTAL) 25 MG tablet Take 50 mg by mouth daily.   06/08/2015 at Unknown time  . levETIRAcetam (KEPPRA) 100 MG/ML solution Place 5 mLs (500 mg total) into feeding tube 2 (two) times daily. 473 mL 12 06/08/2015 at Unknown time  . loperamide (IMODIUM A-D) 2 MG tablet Take 2 mg by mouth daily as needed for diarrhea or loose stools. Max 16 mg per day   unk  . LORazepam (ATIVAN) 0.5 MG tablet Take 1 tablet (0.5 mg total) by mouth every 8 (eight) hours as needed. Anxiety 30 tablet 0 unk  . LORazepam (ATIVAN) 2 MG/ML injection Inject 1 mg into the muscle every 6 (six) hours as needed for seizure.   unk  . morphine (ROXANOL) 20 MG/ML concentrated solution Take 5 mg by mouth every hour as needed (air hunger).   unk  . Multiple Vitamins-Minerals (MULTIVITAMIN WITH MINERALS) tablet Take 1 tablet by mouth daily.   06/08/2015 at Unknown time  . Nutritional Supplements (FEEDING SUPPLEMENT, GLUCERNA 1.5 CAL,) LIQD Place 60 mL/hr into feeding tube See admin instructions. 67ml/hr from 6p to 6a   06/07/2015 at Unknown time  . omeprazole (PRILOSEC) 20 MG capsule Place 20 mg into feeding  tube daily.    06/08/2015 at Unknown time  . ondansetron (ZOFRAN) 4 MG tablet Take 4 mg by mouth every 8 (eight) hours as needed for nausea or vomiting.   unk  . oxyCODONE-acetaminophen (PERCOCET) 7.5-325 MG tablet Take 1 tablet by mouth every 4 (four) hours as needed for severe pain. [place]  tablet 0 unk  . potassium & sodium phosphates (PHOS-NAK) 280-160-250 MG PACK Place 1 packet into feeding tube 4 (four) times daily.    06/08/2015 at Unknown time  . predniSONE (DELTASONE) 5 MG tablet Place 5 mg into feeding tube daily.    06/08/2015 at Unknown time  . Probiotic Product (PROBIOTIC PO) Take 1 capsule by mouth 2 (two) times daily.   06/08/2015 at Unknown time  . promethazine (PHENERGAN) 25 MG/ML injection Inject 25 mg into the vein every 6 (six) hours as needed for nausea or vomiting.   unk  . senna-docusate (SENOKOT-S) 8.6-50 MG per tablet 1 tablet by PEG Tube route daily.    06/08/2015 at Unknown time  . sucralfate (CARAFATE) 1 GM/10ML suspension Place 1 g into feeding tube every 8 (eight) hours.    06/08/2015 at Unknown time  . tacrolimus (PROGRAF) 0.5 MG capsule Take 0.5 mg by mouth 2 (two) times daily.   06/08/2015 at Unknown time  . tacrolimus (PROGRAF) 1 MG capsule 3 mg by PEG Tube route 2 (two) times daily.    06/08/2015 at Unknown time  . tiZANidine (ZANAFLEX) 2 MG tablet Place 2 mg into feeding tube 2 (two) times daily.    06/08/2015 at Unknown time  . Vitamin D, Ergocalciferol, (DRISDOL) 50000 UNITS CAPS Place 50,000 Units into feeding tube every 30 (thirty) days.    Past Month at Unknown time  . Water For Irrigation, Sterile (FREE WATER) SOLN Place 200 mLs into feeding tube every 8 (eight) hours.   06/08/2015 at Unknown time  . Nutritional Supplements (FEEDING SUPPLEMENT, GLUCERNA 1.2 CAL,) LIQD Place 1,000 mLs into feeding tube continuous. (Patient not taking: Reported on 04/05/2015)   Not Taking at Unknown time   Scheduled:  . antiseptic oral rinse  7 mL Mouth Rinse 10 times per day  . aspirin EC   81 mg Oral Daily  . atorvastatin  10 mg Per Tube q1800  . chlorhexidine gluconate  15 mL Mouth Rinse BID  . free water  200 mL Per Tube 3 times per day  . heparin  5,000 Units Subcutaneous 3 times per day  . ipratropium-albuterol  3 mL Nebulization QID  . lamoTRIgine  50 mg Per Tube Daily  . levETIRAcetam  500 mg Per Tube BID  . pantoprazole (PROTONIX) IV  40 mg Intravenous QHS  . sucralfate  1 g Per Tube Q8H  . tacrolimus  0.5 mg Oral BID  . tacrolimus  3 mg Oral BID   Infusions:  . sodium chloride    . fentaNYL infusion INTRAVENOUS 100 mcg/hr (06/09/15 0447)  . insulin (NOVOLIN-R) infusion 28 Units/hr (06/09/15 [id number])    Assessment: 59yo male admitted yesterday for encephalopathy, now w/ concern for HCAP vs aspiration, to begin IV ABX; had started on ABX yesterday but were d/c'd.  Goal of Therapy:  Vancomycin trough level 15-20 mcg/ml  Plan:  Rec'd vanc 1500mg  IV last pm; will continue with vancomycin 750mg  IV Q24H as well as Primaxin 250mg  IV Q6H and monitor CBC, SCr, Cx, levels prn.  Wynona Neat, PharmD, BCPS  06/09/2015,6:05 AM

## 2015-06-09 NOTE — Progress Notes (Signed)
SLP Cancellation Note  Patient Details Name: ZAVIER BIRCKHEAD MRN: [id number] DOB: Apr 25, 1956   Cancelled treatment:       Reason Eval/Treat Not Completed: Medical issues which prohibited therapy. Pt intubated. Will follow for readiness.    Elycia Woodside, Katherene Ponto 06/09/2015, 8:03 AM

## 2015-06-09 NOTE — Consult Note (Addendum)
WOC wound consult note Pt familiar to [place] team from previous admission. WOC consult requested for "warts on penis." Pt has chronic condition with multiple red raised vesicles, some are dry crusted and yellow. Patchy areas of full thickness tissue loss, mod amt tan drainage with odor. Shaft of penis is red and macerated. Appearance is consistent with probable genital herpes; this is beyond [facility] scope of practice. Please consult ID for further plan of care if desired. Pt had previous Urology consult for affected areas on 03/04/15; please refer to their progress note.  No topical treatment was recommended at that time and they recommended a dermatology consult.Xeroform gauze to penis shaft; this will not promote healing, but may assist with minimizing discomfort.     Please re-consult if further assistance is needed.  Thank-you,  Julien Girt MSN, [place], [place], [place], [place]

## 2015-06-09 NOTE — Progress Notes (Signed)
eLink Physician-Brief Progress Note Patient Name: Justin [name] DOB: 07-May-1956 MRN: [id number]   Date of Service  06/09/2015  HPI/Events of Note  Called for RN for facial twitch. Has subtle rhythmic movement of tongue. Instructed RN to adminster drops of saline to eyes: he blinked, withdrew.  Therefore not status epilepticus, though could be myoclonus.    eICU Interventions  EEG in AM Versed prn seizures     Intervention Category Major Interventions: Seizures - evaluation and management  Simonne Maffucci 06/09/2015, 4:04 AM

## 2015-06-09 NOTE — Progress Notes (Signed)
06/09/2015 1650 Dr. Nelda Marseille contacted and made aware of pt. Without any urine output in last hour. MD also made aware SBP lower 80-100 on Propofol. MAP in 60's. No new orders at this time. Will continue to closely monitor patient.  Justin [name], Arville Lime

## 2015-06-09 NOTE — Progress Notes (Signed)
06/09/2015 1330 After d/c fentanyl gtt and prn fentanyl given, pt. Remains agitated, unable to maintain RASS goal of -1. Dr. Chase Caller paged and made aware. Orders received to initiate propofol gtt per protocol. Orders enacted. Will continue to closely monitor patient.  Wille Aubuchon, Arville Lime

## 2015-06-09 NOTE — Progress Notes (Signed)
Initial Nutrition Assessment  DOCUMENTATION CODES:   Not applicable  INTERVENTION:   Initiate Glucerna 1.2 formula at 20 ml/hr and increase by 10 ml every 4 hours to goal rate of 60 ml/hr   Prostat 30 ml BID via tube  Total TF regimen to provide 1928 kcals, 116 gm protein, 1159 ml of free water  NUTRITION DIAGNOSIS:   Inadequate oral intake related to inability to eat as evidenced by NPO status  GOAL:   Patient will meet greater than or equal to 90% of their needs  MONITOR:   TF tolerance, Vent status, Labs, Weight trends, I & O's  REASON FOR ASSESSMENT:   Consult Enteral/tube feeding initiation and management  ASSESSMENT:   59 y.o. 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 He was brought to [facility] evening of 12/5 after staff at SNF found him agonally breathing since 3 PM.  Patient [facility].  Patient is currently intubated on ventilator support MV: 10.7 L/min Temp (24hrs), Avg:97.5 F (36.4 C), Min:96.8 F (36 C), Max:98.8 F (37.1 C)   RD unable to determine TF regimen via PEG tube at SNF.  Previously on Glucerna 1.2 formula during hospital admissions ([facility] and [facility]).  Receiving free water flushes at 200 ml every 8 hours.  RD unable to complete Nutrition Focused Physical Exam at this time.  Diet Order:  Diet NPO time specified  Skin:  Reviewed, no issues  Last BM:  12/6  Height:   Ht Readings from Last 1 Encounters:  06/09/15 6' (1.829 m)    Weight:   Wt Readings from Last 1 Encounters:  06/09/15 169 lb 8.5 oz (76.9 kg)    Ideal Body Weight:  81 kg  BMI:  Body mass index is 22.99 kg/(m^2).  Estimated Nutritional Needs:   Kcal:  1862  Protein:  115-125 gm  Fluid:   per MD  EDUCATION NEEDS:   No education needs identified at this time  Arthur Holms, RD, LDN Pager #: [phone] After-Hours Pager #: [phone]

## 2015-06-09 NOTE — Consult Note (Signed)
NEURO HOSPITALIST CONSULT NOTE   Requestig physician: Dr. Chase Caller   Reason for Consult: AMS and possible seizure activity  HPI:                                                                                                                                          Justin [name] is an 59 y.o. male  SNF resident with complicated medical hx, presented to [facility] ED 12/5 with AMS after several hours of "agonal breathing". Suspect there could possibly be an element of aspiration as he has a history of dysphagia and was discharged to SNF with a diet. A tissue was also found in posterior pharynx during intubation. Patient has had a ICH in the past leaving him plegic on the right and is currently on Keppra and Lamictal for history of seizures.  Per night nurse there was some"abnormal tongue movements over night" which the nurse thought were seizures.   Past Medical History  Diagnosis Date  . Hypertension   . Dyslipidemia   . Carpal tunnel syndrome   . A-V fistula ([place])   . Anemia   . Hemorrhoids   . Hyperparathyroidism   . Intracranial hemorrhage ([place])   . Hypotension   . Hyperkalemia   . Kidney disease   . Sepsis(995.91)   . GI bleeding   . DVT (deep venous thrombosis) ([place])   . PVD (peripheral vascular disease) ([place])   . DM (diabetes mellitus) ([place])   . Cellulitis   . IV infiltration     Right upper extremity  . PPD positive   . 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 Hypokinesis     EF of 60% with mild anterior hypokinesis, minimal coronary artery disease on catheterization in December of 2007  . Gout   . Renal transplant, status post 10/19/2014  . Pseudobulbar affect   . GERD (gastroesophageal reflux disease)     Past Surgical History  Procedure Laterality Date  . Kidney transplant      History of renal transplant maintained on chronic immunosuppressive therapy  . Av fistula placement      Right arm  .  Peripherally inserted central catheter insertion      line placement  . Amputation    . Gastrostomy    . 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 Brain biopsy    . #6 shiley cuff      Less trache    Family History  Problem Relation Age of Onset  . Heart disease Mother   . CAD Mother   . Diabetes type II Mother   . Hypertension Mother   . Cancer Neg Hx       Social  History:  reports that he quit smoking about 14 years ago. He does not have any smokeless tobacco history on file. He reports that he uses illicit drugs. He reports that he does not drink alcohol.  Allergies  Allergen Reactions  . Amoxicillin Other (See Comments)    ON MAR  . Ampicillin Other (See Comments)    ON MAR  . Latex Other (See Comments)    ON MAR  . Morphine And Related Other (See Comments)    UNK  . Penicillins Other (See Comments)    ON MAR; tolerated Ceftriaxone 8/27-9/1  . Tape Other (See Comments)    ON MAR    MEDICATIONS:                                                                                                                     Scheduled: . antiseptic oral rinse  7 mL Mouth Rinse 10 times per day  . aspirin  81 mg Per Tube Daily  . atorvastatin  10 mg Per Tube q1800  . chlorhexidine gluconate  15 mL Mouth Rinse BID  . feeding supplement (PRO-STAT SUGAR FREE 64)  30 mL Per Tube BID  . free water  200 mL Per Tube 3 times per day  . heparin  5,000 Units Subcutaneous 3 times per day  . imipenem-cilastatin  250 mg Intravenous 4 times per day  . ipratropium-albuterol  3 mL Nebulization QID  . lamoTRIgine  50 mg Per Tube Daily  . levETIRAcetam  500 mg Per Tube BID  . pantoprazole (PROTONIX) IV  40 mg Intravenous QHS  . potassium phosphate IVPB (mmol)  10 mmol Intravenous Once  . sucralfate  1 g Per Tube Q8H  . tacrolimus  0.5 mg Oral BID  . tacrolimus  3 mg Oral BID  . [START ON 06/10/2015] vancomycin  750 mg Intravenous Q24H   Continuous: . sodium chloride 1,000 mL  (06/09/15 0621)  . feeding supplement (GLUCERNA 1.2 CAL)    . insulin (NOVOLIN-R) infusion 23.8 mL/hr at 06/09/15 1200     ROS:                                                                                                                                       History obtained from unobtainable from patient due to intubated   Blood pressure 95/61, pulse 72, temperature 96.8 F (36 C), temperature source Axillary, resp. rate  12, height 6' (1.829 m), weight 76.9 kg (169 lb 8.5 oz), SpO2 100 %.   Neurologic Examination:                                                                                                      HEENT-  Normocephalic, no lesions, without obvious abnormality.  Normal external eye and conjunctiva.  Normal TM's bilaterally.  Normal auditory canals and external ears. Normal external nose, mucus membranes and septum.  Normal pharynx. Cardiovascular- S1, S2 normal, pulses palpable throughout   Lungs- chest clear, no wheezing, rales, normal symmetric air entry Abdomen- normal findings: bowel sounds normal Extremities- no edema Lymph-no adenopathy palpable Musculoskeletal-no joint tenderness, deformity or swelling Skin-warm and dry, no hyperpigmentation, vitiligo, or suspicious lesions  Neurological Examination Mental Status: Alert, intubated, tracks my movements and follows simple commands on the left.  Cranial Nerves: II: Discs flat bilaterally; blinks to threat on the left no blink to threat on the right, pupils equal, round, reactive to light and accommodation III,IV, VI: ptosis not present, extra-ocular motions do not fully cross midline to the left V,VII: smile symmetric, no wincing to noxious stimuli on the right VIII: hearing normal bilaterally [id number] but tongue noted to be quivering Motor: Hemiplegic on the right with spontaneous antigravity movement on the left and follows simple commands Sensory: noxious response intact on the left Deep Tendon  Reflexes: 2+ and symmetric throughout Plantars: mute Cerebellar: Unable to obtain Gait: not tested      Lab Results: Basic Metabolic Panel:  Recent Labs Lab 06/08/15 2230 06/09/15 0030 06/09/15 0214 06/09/15 0436 06/09/15 0640  NA 148* 149* 149* 150* 151*  K 5.1 5.0 3.8 3.8 3.5  CL 109 111 116* 118* 119*  CO2 23 21* 20* 18* 19*  GLUCOSE 863* 876* 764* 620* 497*  BUN 71* 72* 66* 66* 64*  CREATININE 3.25* 3.18* 3.10* 2.98* 2.83*  CALCIUM 10.6* 10.2 10.0 10.2 10.1  MG  --   --   --  2.5*  --   PHOS  --   --   --  1.1*  --     Liver Function Tests:  Recent Labs Lab 06/08/15 1840  AST 44*  ALT 30  ALKPHOS 91  BILITOT 1.1  PROT 9.0*  ALBUMIN 3.2*    Recent Labs Lab 06/09/15 0030  LIPASE 52*  AMYLASE 142*   No results for input(s): AMMONIA in the last 168 hours.  CBC:  Recent Labs Lab 06/08/15 1840 06/09/15 0436  WBC 18.0* 11.5*  NEUTROABS 13.3*  --   HGB 18.0* 15.0  HCT 61.7* 50.2  MCV 95.1 90.5  PLT 220 147*    Cardiac Enzymes:  Recent Labs Lab 06/09/15 0030 06/09/15 0640  TROPONINI 0.14* 0.13*    Lipid Panel:  Recent Labs Lab 06/08/15 2134  TRIG 533*    CBG:  Recent Labs Lab 06/09/15 0654 06/09/15 0809 06/09/15 0915 06/09/15 1018 06/09/15 1111  GLUCAP 408* 414* 314* 395* 229*    Microbiology: Results for orders placed or performed during the hospital encounter of 06/08/15  Culture, blood (  routine x 2)     Status: None (Preliminary result)   Collection Time: 06/08/15  6:40 PM  Result Value Ref Range Status   Specimen Description BLOOD RIGHT HAND  Final   Special Requests BOTTLES DRAWN AEROBIC ONLY 8CC  Final   Culture NO GROWTH < 24 HOURS  Final   Report Status PENDING  Incomplete  Culture, blood (routine x 2)     Status: None (Preliminary result)   Collection Time: 06/08/15  9:18 PM  Result Value Ref Range Status   Specimen Description BLOOD LEFT HAND  Final   Special Requests BOTTLES DRAWN AEROBIC AND ANAEROBIC  5CC  Final   Culture NO GROWTH < 24 HOURS  Final   Report Status PENDING  Incomplete  MRSA PCR Screening     Status: None   Collection Time: 06/09/15  4:41 AM  Result Value Ref Range Status   MRSA by PCR NEGATIVE NEGATIVE Final    Comment:        The GeneXpert MRSA Assay (FDA approved for NASAL specimens only), is one component of a comprehensive MRSA colonization surveillance program. It is not intended to diagnose MRSA infection nor to guide or monitor treatment for MRSA infections.     Coagulation Studies: No results for input(s): LABPROT, INR in the last 72 hours.  Imaging: Ct Head Wo Contrast  06/08/2015  CLINICAL DATA:  Patient found unresponsive.  Initial encounter. EXAM: CT HEAD WITHOUT CONTRAST TECHNIQUE: Contiguous axial images were obtained from the base of the skull through the vertex without intravenous contrast. COMPARISON:  CT of the head performed 04/05/2015 FINDINGS: There is no evidence of acute infarction, mass lesion, or intra- or extra-axial hemorrhage on CT. Prominence of the ventricles and sulci reflects moderate cortical volume loss. There is significant chronic encephalomalacia involving the left cerebral hemisphere, with ex vacuo dilatation of the left lateral ventricle and third ventricle. Scattered periventricular white matter change likely reflects small vessel ischemic microangiopathy. Cerebellar atrophy is noted. The brainstem and fourth ventricle are within normal limits. The basal ganglia are unremarkable in appearance. The cerebral hemispheres demonstrate grossly normal gray-white differentiation. No mass effect or midline shift is seen. There is no evidence of fracture; a calvarial defect is noted at the high left parietal calvarium. The visualized portions of the orbits are within normal limits. The paranasal sinuses and mastoid air cells are well-aerated. No significant soft tissue abnormalities are seen. IMPRESSION: 1. No acute intracranial pathology  seen on CT. 2. Significant chronic encephalomalacia involving the left cerebral hemisphere, with ex vacuo dilatation of the left lateral ventricle and third ventricle. 3. Moderate cortical volume loss and scattered small vessel ischemic microangiopathy. Electronically Signed   By: Garald Balding M.D.   On: 06/08/2015 20:16   Dg Chest Port 1 View  06/09/2015  CLINICAL DATA:  Respiratory failure EXAM: PORTABLE CHEST 1 VIEW COMPARISON:  Chest radiograph from one day prior. FINDINGS: Endotracheal tube tip is 3.5 cm above the carina. Enteric tube terminates in proximal stomach. Stable cardiomediastinal silhouette with normal heart size. No pneumothorax. No pleural effusion. Clear lungs, with no focal lung consolidation and no pulmonary edema. IMPRESSION: Well-positioned endotracheal and enteric tubes.  Lungs appear clear. Electronically Signed   By: Ilona Sorrel M.D.   On: 06/09/2015 07:48   Dg Chest Portable 1 View  06/08/2015  CLINICAL DATA:  Unresponsive patient.  Status post intubation. EXAM: PORTABLE CHEST 1 VIEW COMPARISON:  04/08/2015 FINDINGS: There has been an intubation. The endotracheal tube overlies the  tracheal air column and terminates at the level of the clavicular heads. Enteric catheter is also seen, tip overlying the expected location of gastric cardia, side hole slightly past the expected location of the gastroesophageal junction. Cardiomediastinal silhouette is normal. Mediastinal contours appear intact. There is no evidence of focal airspace consolidation, pleural effusion or pneumothorax. Lungs are low volume. Osseous structures are without acute abnormality. Soft tissues are grossly normal. IMPRESSION: Status post intubation with satisfactory position radiographically of the endotracheal tube. Enteric catheter with sidehole slightly past the gastroesophageal junction. Low lung volumes. Electronically Signed   By: Fidela Salisbury M.D.   On: 06/08/2015 19:04       Assessment and plan  per attending neurologist  Etta Quill PA-C [facility] [phone]  06/09/2015, 12:59 PM   Assessment/Plan: 59 YO male admitted to hospital due to agonal breathing and altered emtnal status. Currently he has multiple metabolic issues and HONK state could certainly explain his symptoms. He has seizure history and there was thought he may have been showing abnormal movements with his tongue but unclear if acutal seizures. EEG shows no active epileptiform activity and I suspect the findings there were medication effect rather than actual injury.   Recommend: 1) Treat underlying metabolic issues 2) Place back on his home dose of AED 3) obtain levels of Neurontin, Lamictal and Keppra.    Roland Rack, MD [facility] [phone]  If 7pm- 7am, please page neurology on call as listed in [place].

## 2015-06-09 NOTE — Progress Notes (Signed)
CRITICAL VALUE ALERT  Critical value received:  Positive blood cultures   Date of notification:  06/09/2015  Time of notification:  [id number]  Critical value read back:Yes.    Nurse who received alert:  Lily Kocher  MD notified (1st page):  Dr Nelda Marseille, ccm  Time of first page:  1514  Responding MD:  Dr Nelda Marseille, ccm  Time MD responded:  [id number]

## 2015-06-09 NOTE — Procedures (Signed)
ELECTROENCEPHALOGRAM REPORT  Patient: Justin [name]       Room #: [id number] EEG No. ID: [id number] Age: 59 y.o.        Sex: male Referring Physician: Titus Mould, D Report Date:  06/09/2015        Interpreting Physician: Anthony Sar  History: Justin [name] is an 59 y.o. male with multiple medical problems including B-cell lymphoma, status post renal transplant, status post brain radiation and intracranial hemorrhage, admitted on 06/08/2015 after being found with agonal respirations at the SNF where he resides. He was intubated and placed on mechanical ventilation, and has remained unresponsive.  Indications for study:  Assess severity of encephalopathy; rule out seizure activity.  Technique: This is an 18 channel routine scalp EEG performed at the bedside with bipolar and monopolar montages arranged in accordance to the international 10/20 system of electrode placement.   Description: Patient was intubated and unresponsive to external stimuli during the EEG recording. He was also sedated with fentanyl at one point due to agitation. Predominant activity consisted of low amplitude irregular diffuse delta activity with intermittent brief occurrences of low to moderate amplitude theta activity diffusely, with a pattern indicative of early manifestations of burst suppression. Photic stimulation was not performed. No epileptiform discharges were recorded.  Interpretation: This EEG is abnormal with severe generalized slowing of cerebral activity with a pattern of slowing indicative of early manifestations of burst suppression. This pattern of slowing is most often seen with hypoxic brain injury, but can be seen with metabolic as well as toxic encephalopathies. No evidence of epileptic activity was demonstrated.   Rush Farmer M.D. [facility] [phone]

## 2015-06-09 NOTE — Progress Notes (Signed)
06/09/2015 1630 150 ml Fentanyl wasted in sink. Witnessed by Burt Ek RN. Carnie Bruemmer, Arville Lime

## 2015-06-09 NOTE — H&P (Signed)
PULMONARY / CRITICAL CARE MEDICINE   Name: Justin [name] MRN: [id number] DOB: 1955-10-16    ADMISSION DATE:  06/08/2015 CONSULTATION DATE:  06/08/15  REFERRING MD:  EDP  CHIEF COMPLAINT:  Agonal breathing  BRIEF Justin [name] is a 59 y.o. 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  He was brought to [facility] evening of 12/5 after staff at SNF found him agonally breathing since 3 PM.  In ED, he was completely unresponsive and agonal.  He subsequently required intubation for airway protection.  During intubation, a piece of tissue paper was removed from pt's posterior pharynx.  He had recent admission 04/05/15 through 04/10/15 for urosepsis.   SUBJECTIVE:  06/09/15: on fent 136mcg -> did do some commands on left side (old stroke - right side weak). Intermitent oral seizures noticed by RN. On Keppra. Other seizure meds on hold. RNs ays he was found agonal with tissue paper in mouth. Making good urine with drown trend increat   VITAL SIGNS: BP 106/69 mmHg  Pulse 84  Temp(Src) 97.3 F (36.3 C) (Axillary)  Resp 15  Ht 6' (1.829 m)  Wt 76.9 kg (169 lb 8.5 oz)  BMI 22.99 kg/m2  SpO2 100%  HEMODYNAMICS:    VENTILATOR SETTINGS: Vent Mode:  [-] PRVC FiO2 (%):  [60 %-100 %] 60 % Set Rate:  [18 bmp] 18 bmp Vt Set:  [620 mL] 620 mL PEEP:  [5 cmH20] 5 cmH20 Plateau Pressure:  [21 cmH20-26 cmH20] 21 cmH20  INTAKE / OUTPUT: I/O last 3 completed shifts: In: 4574.2 [I.V.:2414.2; Other:60; NG/GT:400; IV Piggyback:1700] Out: 1095 [Urine:1095]  PHYSICAL EXAMINATION: General: Chronically ill appearing male, in NAD. Neuro: Currently RASS -2/-3 on fent gtt HEENT: [place]/AT. PERRL. Cardiovascular: RRR, no M/R/G.  Lungs: Respirations even and unlabored.  Coarse  bilaterally. Abdomen: PEG in place.  BS x 4, soft, NT/ND.  Musculoskeletal: No gross deformities, no edema.  Skin: Intact, warm, no rashes. Bilateral UE AV fistulas with no bruit or thrill .   LABS: PULMONARY  Recent Labs Lab 06/08/15 2021 06/09/15 0428  PHART 7.466* 7.547*  PCO2ART 36.3 21.3*  PO2ART 381.0* 160*  HCO3 26.2* 18.5*  TCO2 27 19.2  O2SAT 100.0 99.4    CBC  Recent Labs Lab 06/08/15 1840 06/09/15 0436  HGB 18.0* 15.0  HCT 61.7* 50.2  WBC 18.0* 11.5*  PLT 220 147*    COAGULATION No results for input(s): INR in the last 168 hours.  CARDIAC   Recent Labs Lab 06/09/15 0030 06/09/15 0640  TROPONINI 0.14* 0.13*   No results for input(s): PROBNP in the last 168 hours.   CHEMISTRY  Recent Labs Lab 06/08/15 2230 06/09/15 0030 06/09/15 0214 06/09/15 0436 06/09/15 0640  NA 148* 149* 149* 150* 151*  K 5.1 5.0 3.8 3.8 3.5  CL 109 111 116* 118* 119*  CO2 23 21* 20* 18* 19*  GLUCOSE 863* 876* 764* 620* 497*  BUN 71* 72* 66* 66* 64*  CREATININE 3.25* 3.18* 3.10* 2.98* 2.83*  CALCIUM 10.6* 10.2 10.0 10.2 10.1  MG  --   --   --  2.5*  --   PHOS  --   --   --  1.1*  --    Estimated Creatinine Clearance: 29.8 mL/min (by C-G formula based  on Cr of 2.83).   LIVER  Recent Labs Lab 06/08/15 1840  AST 44*  ALT 30  ALKPHOS 91  BILITOT 1.1  PROT 9.0*  ALBUMIN 3.2*     INFECTIOUS  Recent Labs Lab 06/08/15 2134  06/09/15 0030 06/09/15 0436 06/09/15 0855  LATICACIDVEN  --   < > 6.2* 5.5* 4.1*  PROCALCITON 0.24  --   --  0.70  --   < > = values in this interval not displayed.   ENDOCRINE CBG (last 3)   Recent Labs  06/09/15 0503 06/09/15 0559 06/09/15 0654  GLUCAP 490* 460* 408*         IMAGING x48h  - image(Ct Head Wo Contrast  06/08/2015  CLINICAL DATA:  Patient found unresponsive.  Initial encounter. EXAM: CT HEAD WITHOUT CONTRAST TECHNIQUE: Contiguous axial images were obtained from the base of the skull through the  vertex without intravenous contrast. COMPARISON:  CT of the head performed 04/05/2015 FINDINGS: There is no evidence of acute infarction, mass lesion, or intra- or extra-axial hemorrhage on CT. Prominence of the ventricles and sulci reflects moderate cortical volume loss. There is significant chronic encephalomalacia involving the left cerebral hemisphere, with ex vacuo dilatation of the left lateral ventricle and third ventricle. Scattered periventricular white matter change likely reflects small vessel ischemic microangiopathy. Cerebellar atrophy is noted. The brainstem and fourth ventricle are within normal limits. The basal ganglia are unremarkable in appearance. The cerebral hemispheres demonstrate grossly normal gray-white differentiation. No mass effect or midline shift is seen. There is no evidence of fracture; a calvarial defect is noted at the high left parietal calvarium. The visualized portions of the orbits are within normal limits. The paranasal sinuses and mastoid air cells are well-aerated. No significant soft tissue abnormalities are seen. IMPRESSION: 1. No acute intracranial pathology seen on CT. 2. Significant chronic encephalomalacia involving the left cerebral hemisphere, with ex vacuo dilatation of the left lateral ventricle and third ventricle. 3. Moderate cortical volume loss and scattered small vessel ischemic microangiopathy. Electronically Signed   By: Garald Balding M.D.   On: 06/08/2015 20:16   Dg Chest Port 1 View  06/09/2015  CLINICAL DATA:  Respiratory failure EXAM: PORTABLE CHEST 1 VIEW COMPARISON:  Chest radiograph from one day prior. FINDINGS: Endotracheal tube tip is 3.5 cm above the carina. Enteric tube terminates in proximal stomach. Stable cardiomediastinal silhouette with normal heart size. No pneumothorax. No pleural effusion. Clear lungs, with no focal lung consolidation and no pulmonary edema. IMPRESSION: Well-positioned endotracheal and enteric tubes.  Lungs appear  clear. Electronically Signed   By: Ilona Sorrel M.D.   On: 06/09/2015 07:48   Dg Chest Portable 1 View  06/08/2015  CLINICAL DATA:  Unresponsive patient.  Status post intubation. EXAM: PORTABLE CHEST 1 VIEW COMPARISON:  04/08/2015 FINDINGS: There has been an intubation. The endotracheal tube overlies the tracheal air column and terminates at the level of the clavicular heads. Enteric catheter is also seen, tip overlying the expected location of gastric cardia, side hole slightly past the expected location of the gastroesophageal junction. Cardiomediastinal silhouette is normal. Mediastinal contours appear intact. There is no evidence of focal airspace consolidation, pleural effusion or pneumothorax. Lungs are low volume. Osseous structures are without acute abnormality. Soft tissues are grossly normal. IMPRESSION: Status post intubation with satisfactory position radiographically of the endotracheal tube. Enteric catheter with sidehole slightly past the gastroesophageal junction. Low lung volumes. Electronically Signed   By: Fidela Salisbury M.D.   On: 06/08/2015 19:04  STUDIES:  CXR 12/5 > Low volumes. CT head 12/5 >  CULTURES: Blood 12/5 > Sputum 12/5 > Urine 12/5 >  ANTIBIOTICS: Vanc 12/5 > Aztreonam 12/5 > 12/6 Imipenem>>>  SIGNIFICANT EVENTS: 121/5 > admitted after agonal breathing, found to have tissue paper in pharynx.  Intubated.  LINES/TUBES: ETT 12/5 > PEG (chronic) 12/5 >  DISCUSSION: 59 year old male, SNF resident with complicated medical hx, presented [facility] ED 12/5 with AMS after several hours of "agonal breathing". Suspect there could possibly be an element of aspiration as he has a history of dysphagia and was discharged to SNF with a diet. A tissue was also found in posterior pharynx during intubation. However, I think that this is more likely dehydration based on AKI, hemoconcentration, hypernatremia, and hyperglycemia. 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 Will hydrate somewhat  aggressively, treat empirically for infection, and initiate insulin gtt. Will also hold sedating outpatient meds, of which he takes several. Trend troponin and repeat EKG due to elevated troponin in ED.   ASSESSMENT / PLAN:  PULMONARY A: Acute hypoxemic respiratory failure secondary to suspected aspiration pneumonitis   - does not meet SBT criteria due to Acute enceph  P:   Full vent support CXR in AM Follow ABG Vent bundle  CARDIOVASCULAR A:  Elevated troponin - likely demand H/o HTN   P:  Telemetry Hold home hydralazine, isosorbide, amlodipine Continue home ASA, atorvastatin  Repeat EKG Trend troponin  RENAL A:   Acute on CKD status post cadaveric renal transplant, not-on HD.    - improving with fluids. Hypo-phos P:   Replete 10 mml K phos Calcium, insulin Free water flushes 130mL q 6 hours.  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 Likely multifactorial in setting resp failure, hyperglycemia, sedating meds.  H/o ICH Depression Anxiety Pseudobulbar affect H/o unspecified convusions  - several; CNS meds on hold  P:   RASS goal: 0 Propofol if needed Holding home, lamictal, gabapentin, fentanyl patch, tizanidine, nuedexta, celexa Continue home  Keppra NEURO COnsult called DC fent gtt Use fent and versed prn   FAMILY  - Updates: no family available  - Inter-disciplinary family meet or Palliative Care meeting due by:  12/12      The patient is critically ill with multiple organ systems failure and requires high complexity decision making for assessment and support, frequent evaluation and titration of therapies, application of advanced monitoring technologies and extensive interpretation of multiple databases.   Critical Care Time devoted to patient care services described in this note is  30  Minutes. This time reflects time of care of this signee Dr Brand Males. This critical care time does not reflect procedure time, or teaching time or supervisory time of PA/NP/Med student/Med Resident etc but could  involve care discussion time    Dr. Brand Males, M.D., [facility].C.P Pulmonary and Critical Care Medicine Staff Physician [place] [facility] Pager: [phone], If no answer or between  15:00h - 7:00h: call [phone]  06/09/2015 11:31 AM

## 2015-06-09 NOTE — Progress Notes (Signed)
06/09/2015 5:53 PM Nursing note Dr. Nelda Marseille paged and made aware of pt. Continuing to reach for ETT and become restless at times despite Propofol gtt and prn fentanyl. Unable to increase Propofol gtt due to BP. Orders received for soft wrist restraint LUE. Family at bedside and educated on restraint use. Questions and concerns addressed.  Justin [name], Arville Lime

## 2015-06-09 NOTE — Progress Notes (Signed)
Received call from Lily Kocher, RN, regarding critical lab value GPC in clusters to aerobic blood culture bottle and notified eMD Dr. Nelda Marseille.

## 2015-06-09 NOTE — Progress Notes (Signed)
EEG Completed; Results Pending  

## 2015-06-10 ENCOUNTER — Inpatient Hospital Stay (HOSPITAL_COMMUNITY): Payer: Medicare Other

## 2015-06-10 DIAGNOSIS — J9601 Acute respiratory failure with hypoxia: Secondary | ICD-10-CM

## 2015-06-10 LAB — LACTIC ACID, PLASMA: Lactic Acid, Venous: 2.3 mmol/L (ref 0.5–2.0)

## 2015-06-10 LAB — GLUCOSE, CAPILLARY
GLUCOSE-CAPILLARY: 205 mg/dL — AB (ref 65–99)
GLUCOSE-CAPILLARY: 241 mg/dL — AB (ref 65–99)
GLUCOSE-CAPILLARY: 241 mg/dL — AB (ref 65–99)
GLUCOSE-CAPILLARY: 191 mg/dL — AB (ref 65–99)
GLUCOSE-CAPILLARY: 185 mg/dL — AB (ref 65–99)
GLUCOSE-CAPILLARY: 124 mg/dL — AB (ref 65–99)
GLUCOSE-CAPILLARY: 141 mg/dL — AB (ref 65–99)
GLUCOSE-CAPILLARY: 139 mg/dL — AB (ref 65–99)
GLUCOSE-CAPILLARY: 140 mg/dL — AB (ref 65–99)
GLUCOSE-CAPILLARY: 155 mg/dL — AB (ref 65–99)
GLUCOSE-CAPILLARY: 186 mg/dL — AB (ref 65–99)
GLUCOSE-CAPILLARY: 170 mg/dL — AB (ref 65–99)
GLUCOSE-CAPILLARY: 172 mg/dL — AB (ref 65–99)
GLUCOSE-CAPILLARY: 161 mg/dL — AB (ref 65–99)
Glucose-Capillary: 218 mg/dL — ABNORMAL HIGH (ref 65–99)
Glucose-Capillary: 132 mg/dL — ABNORMAL HIGH (ref 65–99)
Glucose-Capillary: 137 mg/dL — ABNORMAL HIGH (ref 65–99)
Glucose-Capillary: 144 mg/dL — ABNORMAL HIGH (ref 65–99)
Glucose-Capillary: 157 mg/dL — ABNORMAL HIGH (ref 65–99)
Glucose-Capillary: 172 mg/dL — ABNORMAL HIGH (ref 65–99)
Glucose-Capillary: 118 mg/dL — ABNORMAL HIGH (ref 65–99)
Glucose-Capillary: 127 mg/dL — ABNORMAL HIGH (ref 65–99)

## 2015-06-10 LAB — CBC WITH DIFFERENTIAL/PLATELET
BASOS ABS: 0 10*3/uL (ref 0.0–0.1)
BASOS PCT: 0 %
Eosinophils Absolute: 0 10*3/uL (ref 0.0–0.7)
Eosinophils Relative: 0 %
HEMATOCRIT: 43.2 % (ref 39.0–52.0)
HEMOGLOBIN: 13.2 g/dL (ref 13.0–17.0)
Lymphocytes Relative: 15 %
Lymphs Abs: 1.5 10*3/uL (ref 0.7–4.0)
MCH: 26.8 pg (ref 26.0–34.0)
MCHC: 30.6 g/dL (ref 30.0–36.0)
MCV: 87.8 fL (ref 78.0–100.0)
Monocytes Absolute: 0.4 10*3/uL (ref 0.1–1.0)
Monocytes Relative: 4 %
NEUTROS ABS: 8.1 10*3/uL — AB (ref 1.7–7.7)
NEUTROS PCT: 81 %
Platelets: 129 10*3/uL — ABNORMAL LOW (ref 150–400)
RBC: 4.92 MIL/uL (ref 4.22–5.81)
RDW: 14.5 % (ref 11.5–15.5)
WBC: 10 10*3/uL (ref 4.0–10.5)

## 2015-06-10 LAB — BASIC METABOLIC PANEL
ANION GAP: 11 (ref 5–15)
BUN: 62 mg/dL — AB (ref 6–20)
CO2: 22 mmol/L (ref 22–32)
Calcium: 9.5 mg/dL (ref 8.9–10.3)
Chloride: 116 mmol/L — ABNORMAL HIGH (ref 101–111)
Creatinine, Ser: 2.51 mg/dL — ABNORMAL HIGH (ref 0.61–1.24)
GFR calc Af Amer: 30 mL/min — ABNORMAL LOW (ref >60)
GFR calc non Af Amer: 26 mL/min — ABNORMAL LOW (ref >60)
GLUCOSE: 246 mg/dL — AB (ref 65–99)
POTASSIUM: 3.6 mmol/L (ref 3.5–5.1)
Sodium: 149 mmol/L — ABNORMAL HIGH (ref 135–145)

## 2015-06-10 LAB — URINE CULTURE

## 2015-06-10 LAB — MAGNESIUM: Magnesium: 2.2 mg/dL (ref 1.7–2.4)

## 2015-06-10 LAB — PROCALCITONIN: PROCALCITONIN: 0.83 ng/mL

## 2015-06-10 LAB — TRIGLYCERIDES: Triglycerides: 387 mg/dL — ABNORMAL HIGH (ref <150)

## 2015-06-10 LAB — PHOSPHORUS: Phosphorus: 2.1 mg/dL — ABNORMAL LOW (ref 2.5–4.6)

## 2015-06-10 LAB — CK TOTAL AND CKMB (NOT AT ARMC)
CK TOTAL: 338 U/L (ref 49–397)
CK, MB: 5.3 ng/mL — AB (ref 0.5–5.0)
RELATIVE INDEX: 1.6 (ref 0.0–2.5)

## 2015-06-10 LAB — LAMOTRIGINE LEVEL: Lamotrigine Lvl: NOT DETECTED ug/mL (ref 2.0–20.0)

## 2015-06-10 MED ORDER — POTASSIUM PHOSPHATES 15 MMOLE/5ML IV SOLN
10.0000 mmol | Freq: Once | INTRAVENOUS | Status: AC
  Administered 2015-06-10: 10 mmol via INTRAVENOUS
  Filled 2015-06-10: qty 3.33

## 2015-06-10 MED ORDER — FREE WATER
200.0000 mL | Freq: Four times a day (QID) | Status: DC
  Administered 2015-06-10 – 2015-06-14 (×17): 200 mL

## 2015-06-10 MED ORDER — SODIUM CHLORIDE 0.9 % IV SOLN
1.0000 g | Freq: Two times a day (BID) | INTRAVENOUS | Status: DC
  Administered 2015-06-10 – 2015-06-13 (×7): 1 g via INTRAVENOUS
  Filled 2015-06-10 (×8): qty 1

## 2015-06-10 NOTE — Progress Notes (Signed)
ANTIBIOTIC CONSULT NOTE - FOLLOW UP  Pharmacy Consult for Vancomycin and Meropenem Indication: HCAP vs aspiration PNA  Allergies  Allergen Reactions  . Amoxicillin Other (See Comments)    ON MAR  . Ampicillin Other (See Comments)    ON MAR  . Latex Other (See Comments)    ON MAR  . Morphine And Related Other (See Comments)    UNK  . Penicillins Other (See Comments)    ON MAR; tolerated Ceftriaxone 8/27-9/1  . Tape Other (See Comments)    ON MAR    Patient Measurements: Height: 6' (182.9 cm) Weight: 182 lb 8.7 oz (82.8 kg) IBW/kg (Calculated) : 77.6  Vital Signs: Temp: 96.1 F (35.6 C) (12/07 0906) Temp Source: Axillary (12/07 0906) BP: 97/51 mmHg (12/07 1241) Pulse Rate: 71 (12/07 1241) Intake/Output from previous day: 12/06 0701 - 12/07 0700 In: 5705.8 [I.V.:3852.8; NG/GT:1080; IV Piggyback:703] Out: 680 [Urine:680] Intake/Output from this shift: Total I/O In: 1669 [I.V.:826; Other:60; NG/GT:530; IV Piggyback:253] Out: 300 [Urine:300]  Labs:  Recent Labs  06/08/15 1840  06/09/15 0436 06/09/15 0640 06/09/15 0712 06/09/15 1448 06/10/15 0044  WBC 18.0*  --  11.5*  --   --   --  10.0  HGB 18.0*  --  15.0  --   --   --  13.2  PLT 220  --  147*  --   --   --  129*  LABCREA  --   --   --   --  98.36  --   --   CREATININE 3.36*  < > 2.98* 2.83*  --  2.51* 2.51*  < > = values in this interval not displayed. Estimated Creatinine Clearance: 33.9 mL/min (by C-G formula based on Cr of 2.51). No results for input(s): VANCOTROUGH, VANCOPEAK, VANCORANDOM, GENTTROUGH, GENTPEAK, GENTRANDOM, TOBRATROUGH, TOBRAPEAK, TOBRARND, AMIKACINPEAK, AMIKACINTROU, AMIKACIN in the last 72 hours.   Microbiology: Recent Results (from the past 720 hour(s))  Culture, blood (routine x 2)     Status: None (Preliminary result)   Collection Time: 06/08/15  6:40 PM  Result Value Ref Range Status   Specimen Description BLOOD RIGHT HAND  Final   Special Requests BOTTLES DRAWN AEROBIC ONLY 8CC   Final   Culture  Setup Time   Final    GRAM POSITIVE COCCI IN CLUSTERS AEROBIC BOTTLE ONLY CRITICAL RESULT CALLED TO, READ BACK BY AND VERIFIED WITH: S Reading 06/09/15 @ 39 M VESTAL    Culture GRAM POSITIVE COCCI  Final   Report Status PENDING  Incomplete  Culture, blood (routine x 2)     Status: None (Preliminary result)   Collection Time: 06/08/15  9:18 PM  Result Value Ref Range Status   Specimen Description BLOOD LEFT HAND  Final   Special Requests BOTTLES DRAWN AEROBIC AND ANAEROBIC 5CC  Final   Culture NO GROWTH 2 DAYS  Final   Report Status PENDING  Incomplete  MRSA PCR Screening     Status: None   Collection Time: 06/09/15  4:41 AM  Result Value Ref Range Status   MRSA by PCR NEGATIVE NEGATIVE Final    Comment:        The GeneXpert MRSA Assay (FDA approved for NASAL specimens only), is one component of a comprehensive MRSA colonization surveillance program. It is not intended to diagnose MRSA infection nor to guide or monitor treatment for MRSA infections.   Urine culture     Status: None   Collection Time: 06/09/15  7:11 AM  Result Value Ref Range  Status   Specimen Description URINE, CATHETERIZED  Final   Special Requests NONE  Final   Culture >=100,000 COLONIES/mL YEAST  Final   Report Status 06/10/2015 FINAL  Final  Culture, respiratory (tracheal aspirate)     Status: None (Preliminary result)   Collection Time: 06/09/15  4:35 PM  Result Value Ref Range Status   Specimen Description TRACHEAL ASPIRATE  Final   Special Requests NONE  Final   Gram Stain   Final    ABUNDANT WBC PRESENT, PREDOMINANTLY PMN NO SQUAMOUS EPITHELIAL CELLS SEEN ABUNDANT GRAM NEGATIVE RODS FEW GRAM POSITIVE COCCI IN PAIRS FEW YEAST    Culture   Final    Culture reincubated for better growth Performed at [facility]    Report Status PENDING  Incomplete    Anti-infectives    Start     Dose/Rate Route Frequency Ordered Stop   06/10/15 1230  meropenem (MERREM) 1 g in  sodium chloride 0.9 % 100 mL IVPB     1 g 200 mL/hr over 30 Minutes Intravenous Every 12 hours 06/10/15 1209     06/10/15 0000  vancomycin (VANCOCIN) IVPB 750 mg/150 ml premix     750 mg 150 mL/hr over 60 Minutes Intravenous Every 24 hours 06/09/15 0611     06/09/15 0615  imipenem-cilastatin (PRIMAXIN) 250 mg in sodium chloride 0.9 % 100 mL IVPB  Status:  Discontinued     250 mg 200 mL/hr over 30 Minutes Intravenous 4 times per day 06/09/15 0611 06/10/15 1056   06/08/15 2000  vancomycin (VANCOCIN) 1,500 mg in sodium chloride 0.9 % 500 mL IVPB     1,500 mg 250 mL/hr over 120 Minutes Intravenous  Once 06/08/15 1958 06/08/15 2239   06/08/15 2000  aztreonam (AZACTAM) 1 g in dextrose 5 % 50 mL IVPB  Status:  Discontinued     1 g 100 mL/hr over 30 Minutes Intravenous Every 8 hours 06/08/15 1958 06/09/15 0559      Assessment: 59 yo M presented on 12/5 with AMS and agonal breathing.  Pt started on empiric antibiotics for HCAP vs aspiration pna coverage.  Pt has a hx of seizure and therefore will be changed from Imipenem to Meropenem.  Vanc 12/5 > Aztreonam 12/5 > 12/6 Primax 12/6 > 12/7 Meropenem 12/7 >  12/5 blood x 2 >> 1/2 GPC clusters 12/6 urine 12/6 MRSA neg 12/6 TA >> abundant GNR, few GPC, few yeast  Goal of Therapy:  Vancomycin trough level 15-20 mcg/ml  Renal dose adjustment of antibiotics  Plan:  Continue Vancomycin 750mg  IV Q24H  Meropenem 1gm IV q12h Monitor CBC, SCr, Cx, levels prn.  Monitor triglycerides with propofol - currently elevated at [address] Pharm.D., BCPS Clinical Pharmacist Pager [phone] 06/10/2015 1:14 PM

## 2015-06-10 NOTE — Progress Notes (Signed)
PULMONARY / CRITICAL CARE MEDICINE   Name: Justin [name] MRN: [id number] DOB: 24-Jul-1955    ADMISSION DATE:  06/08/2015 CONSULTATION DATE:  06/08/15  REFERRING MD:  EDP  CHIEF COMPLAINT:  Agonal breathing  BRIEF Justin [name] is a 59 y.o. 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  He was brought to [facility] evening of 12/5 after staff at SNF found him agonally breathing since 3 PM.  In ED, he was completely unresponsive and agonal.  He subsequently required intubation for airway protection.  During intubation, a piece of tissue paper was removed from pt's posterior pharynx.  He had recent admission 04/05/15 through 04/10/15 for urosepsis.  06/09/15: on fent 132mcg -> did do some commands on left side (old stroke - right side weak). Intermitent oral seizures noticed by RN. On Keppra. Other seizure meds on hold. RNs ays he was found agonal with tissue paper in mouth. Making good urine with drown trend increat   SUBJECTIVE/OVERNIGHT/INTERVAL HX 06/10/15 - not on pressors. LEft side weak. Failed SBT. Neuro thinks is baseline. Neuro recs change imipenem due to seizure. No further seizures noted. RN requesting CVL. GPC in blood   VITAL SIGNS: BP 126/78 mmHg  Pulse 74  Temp(Src) 96.1 F (35.6 C) (Axillary)  Resp 21  Ht 6' (1.829 m)  Wt 182 lb 8.7 oz (82.8 kg)  BMI 24.75 kg/m2  SpO2 100%  HEMODYNAMICS:    VENTILATOR SETTINGS: Vent Mode:  [-] PRVC FiO2 (%):  [40 %-60 %] 40 % Set Rate:  [12 bmp] 12 bmp Vt Set:  RW:212346 mL] 620 mL PEEP:  [5 cmH20] 5 cmH20 Plateau Pressure:  [17 cmH20-24 cmH20] 24 cmH20  INTAKE / OUTPUT: I/O last 3 completed shifts: In: [id number] [I.V.:6267; Other:130; NG/GT:1480; IV [id number] Out: [id number]  PHYSICAL  EXAMINATION: General: Chronically ill appearing male, in NAD. Neuro: Currently RASS -1/-2 on diprivan. Left sid e eak HEENT: [place]/AT. PERRL. Cardiovascular: RRR, no M/R/G.  Lungs: Respirations even and unlabored.  Coarse bilaterally. Abdomen: PEG in place.  BS x 4, soft, NT/ND.  Musculoskeletal: No gross deformities, no edema.  Skin: Intact, warm, no rashes. Bilateral UE AV fistulas with no bruit or thrill .   LABS: PULMONARY  Recent Labs Lab 06/08/15 2021 06/09/15 0428  PHART 7.466* 7.547*  PCO2ART 36.3 21.3*  PO2ART 381.0* 160*  HCO3 26.2* 18.5*  TCO2 27 19.2  O2SAT 100.0 99.4    CBC  Recent Labs Lab 06/08/15 1840 06/09/15 0436 06/10/15 0044  HGB 18.0* 15.0 13.2  HCT 61.7* 50.2 43.2  WBC 18.0* 11.5* 10.0  PLT 220 147* 129*    COAGULATION No results for input(s): INR in the last 168 hours.  CARDIAC    Recent Labs Lab 06/09/15 0030 06/09/15 0640  TROPONINI 0.14* 0.13*   No results for input(s): PROBNP in the last 168 hours.   CHEMISTRY  Recent Labs Lab 06/09/15 0214 06/09/15 0436 06/09/15 0640 06/09/15 1448 06/10/15 0044  NA 149* 150* 151* 156* 149*  K 3.8 3.8 3.5 3.1* 3.6  CL 116* 118* 119* 120* 116*  CO2 20* 18* 19* 24 22  GLUCOSE 764* 620* 497* 84 246*  BUN 66* 66* 64* 59* 62*  CREATININE 3.10* 2.98* 2.83* 2.51* 2.51*  CALCIUM 10.0 10.2 10.1 10.2  9.5  MG  --  2.5*  --   --  2.2  PHOS  --  1.1*  --   --  2.1*   Estimated Creatinine Clearance: 33.9 mL/min (by C-G formula based on Cr of 2.51).   LIVER  Recent Labs Lab 06/08/15 1840  AST 44*  ALT 30  ALKPHOS 91  BILITOT 1.1  PROT 9.0*  ALBUMIN 3.2*     INFECTIOUS  Recent Labs Lab 06/08/15 2134  06/09/15 0436 06/09/15 0855 06/09/15 1140 06/10/15 0044  LATICACIDVEN  --   < > 5.5* 4.1* 3.8* 2.3*  PROCALCITON 0.24  --  0.70  --   --  0.83  < > = values in this interval not displayed.   ENDOCRINE CBG (last 3)   Recent Labs  06/10/15 0155 06/10/15 0258  06/10/15 0329  GLUCAP 205* 241* 241*         IMAGING x48h  - image(Ct Head Wo Contrast  06/08/2015  CLINICAL DATA:  Patient found unresponsive.  Initial encounter. EXAM: CT HEAD WITHOUT CONTRAST TECHNIQUE: Contiguous axial images were obtained from the base of the skull through the vertex without intravenous contrast. COMPARISON:  CT of the head performed 04/05/2015 FINDINGS: There is no evidence of acute infarction, mass lesion, or intra- or extra-axial hemorrhage on CT. Prominence of the ventricles and sulci reflects moderate cortical volume loss. There is significant chronic encephalomalacia involving the left cerebral hemisphere, with ex vacuo dilatation of the left lateral ventricle and third ventricle. Scattered periventricular white matter change likely reflects small vessel ischemic microangiopathy. Cerebellar atrophy is noted. The brainstem and fourth ventricle are within normal limits. The basal ganglia are unremarkable in appearance. The cerebral hemispheres demonstrate grossly normal gray-white differentiation. No mass effect or midline shift is seen. There is no evidence of fracture; a calvarial defect is noted at the high left parietal calvarium. The visualized portions of the orbits are within normal limits. The paranasal sinuses and mastoid air cells are well-aerated. No significant soft tissue abnormalities are seen. IMPRESSION: 1. No acute intracranial pathology seen on CT. 2. Significant chronic encephalomalacia involving the left cerebral hemisphere, with ex vacuo dilatation of the left lateral ventricle and third ventricle. 3. Moderate cortical volume loss and scattered small vessel ischemic microangiopathy. Electronically Signed   By: Garald Balding M.D.   On: 06/08/2015 20:16   Dg Chest Port 1 View  06/10/2015  CLINICAL DATA:  Intubation. EXAM: PORTABLE CHEST 1 VIEW COMPARISON:  06/09/2015. FINDINGS: Endotracheal tube in stable position. The NG tube is been withdrawn, its tip is  at the gastroesophageal junction. Advancement suggested. Mild cardiomegaly. Bibasilar atelectasis and infiltrates particular prominent left lung base. These findings are noted. Small left pleural effusion cannot be excluded. Right costophrenic angle not imaged. No pneumothorax. IMPRESSION: 1. Endotracheal tube in stable position. 2. Interim partial withdrawal of NG tube, its tip is at the gastroesophageal junction. Further advancement of approximately 10 cm suggested. 3. Bibasilar atelectasis and infiltrates, particular prominent in the left lung base . Small left pleural effusion cannot be exclude. These are new findings. Critical Value/emergent results were called by telephone at the time of interpretation on 06/10/2015 at 7:32 am to nurse Kathlee Nations, who verbally acknowledged these results. Electronically Signed   By: Marcello Moores  Register   On: 06/10/2015 07:34   Dg Chest Port 1 View  06/09/2015  CLINICAL DATA:  Respiratory failure EXAM: PORTABLE CHEST 1 VIEW COMPARISON:  Chest radiograph from one day prior. FINDINGS: Endotracheal tube tip is 3.5 cm  above the carina. Enteric tube terminates in proximal stomach. Stable cardiomediastinal silhouette with normal heart size. No pneumothorax. No pleural effusion. Clear lungs, with no focal lung consolidation and no pulmonary edema. IMPRESSION: Well-positioned endotracheal and enteric tubes.  Lungs appear clear. Electronically Signed   By: Ilona Sorrel M.D.   On: 06/09/2015 07:48   Dg Chest Portable 1 View  06/08/2015  CLINICAL DATA:  Unresponsive patient.  Status post intubation. EXAM: PORTABLE CHEST 1 VIEW COMPARISON:  04/08/2015 FINDINGS: There has been an intubation. The endotracheal tube overlies the tracheal air column and terminates at the level of the clavicular heads. Enteric catheter is also seen, tip overlying the expected location of gastric cardia, side hole slightly past the expected location of the gastroesophageal junction. Cardiomediastinal silhouette is  normal. Mediastinal contours appear intact. There is no evidence of focal airspace consolidation, pleural effusion or pneumothorax. Lungs are low volume. Osseous structures are without acute abnormality. Soft tissues are grossly normal. IMPRESSION: Status post intubation with satisfactory position radiographically of the endotracheal tube. Enteric catheter with sidehole slightly past the gastroesophageal junction. Low lung volumes. Electronically Signed   By: Fidela Salisbury M.D.   On: 06/08/2015 19:04   Results for orders placed or performed during the hospital encounter of 06/08/15  Culture, blood (routine x 2)     Status: None (Preliminary result)   Collection Time: 06/08/15  6:40 PM  Result Value Ref Range Status   Specimen Description BLOOD RIGHT HAND  Final   Special Requests BOTTLES DRAWN AEROBIC ONLY 8CC  Final   Culture  Setup Time   Final    GRAM POSITIVE COCCI IN CLUSTERS AEROBIC BOTTLE ONLY CRITICAL RESULT CALLED TO, READ BACK BY AND VERIFIED WITH: S Lyden 06/09/15 @ 60 M VESTAL    Culture GRAM POSITIVE COCCI  Final   Report Status PENDING  Incomplete  Culture, blood (routine x 2)     Status: None (Preliminary result)   Collection Time: 06/08/15  9:18 PM  Result Value Ref Range Status   Specimen Description BLOOD LEFT HAND  Final   Special Requests BOTTLES DRAWN AEROBIC AND ANAEROBIC 5CC  Final   Culture NO GROWTH < 24 HOURS  Final   Report Status PENDING  Incomplete  MRSA PCR Screening     Status: None   Collection Time: 06/09/15  4:41 AM  Result Value Ref Range Status   MRSA by PCR NEGATIVE NEGATIVE Final    Comment:        The GeneXpert MRSA Assay (FDA approved for NASAL specimens only), is one component of a comprehensive MRSA colonization surveillance program. It is not intended to diagnose MRSA infection nor to guide or monitor treatment for MRSA infections.   Culture, respiratory (tracheal aspirate)     Status: None (Preliminary result)   Collection Time:  06/09/15  4:35 PM  Result Value Ref Range Status   Specimen Description TRACHEAL ASPIRATE  Final   Special Requests NONE  Final   Gram Stain   Final    ABUNDANT WBC PRESENT, PREDOMINANTLY PMN NO SQUAMOUS EPITHELIAL CELLS SEEN ABUNDANT GRAM NEGATIVE RODS FEW GRAM POSITIVE COCCI IN PAIRS FEW YEAST    Culture   Final    Culture reincubated for better growth Performed at [facility]    Report Status PENDING  Incomplete      DISCUSSION: 59 year old male, SNF resident with complicated medical hx, presented to [facility] ED 12/5 with AMS after several hours of "agonal breathing". Suspect there  could possibly be an element of aspiration as he has a history of dysphagia and was discharged to SNF with a diet. A tissue was also found in posterior pharynx during intubation. However, I think that this is more likely dehydration based on AKI, hemoconcentration, hypernatremia, and hyperglycemia. 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 Will hydrate somewhat aggressively, treat empirically for infection, and initiate insulin gtt. Will also hold sedating outpatient meds, of which he takes several. Trend troponin and repeat EKG due to elevated troponin in ED.   ASSESSMENT / PLAN:  PULMONARY A: Acute hypoxemic respiratory failure secondary to suspected aspiration pneumonitis   - does not meet SBT criteria due to Acute enceph  P:   Full vent support CXR in AM Follow ABG Vent bundle  CARDIOVASCULAR A:  Elevated troponin - likely demand H/o HTN   P:  Telemetry Hold home hydralazine, isosorbide, amlodipine Continue home ASA, atorvastatin  Repeat EKG Trend troponin  RENAL A:   Acute on CKD status post cadaveric renal transplant, not-on HD.    - improving with fluids. Hypo-phos. Hypernatermic too  P:   Replete 10 mml K phos Free water flushes 218mL q6h increase Follow Bmet Hydrate  GASTROINTESTINAL A:   GERD PEG status  - toelrating PEG feeds  P:   NPO besides meds via PEG  And TF via peg PPI  as per home regimen Carafate per tube  HEMATOLOGIC A:   Suspect hemoconcentration H/o B cell lymphoma s/p radiation  P:  Hydrate Follow CBC Heparin for VTE ppx  INFECTIOUS  A:   CULTURES: Blood 12/5 > Sputum 12/5 > Urine 12/5 >   Concern for aspiration PNA vs HCAP Immunosuppressed after renal tx, on tacrolimus   P:   ANTIBIOTICS: Vanc 12/5 > Aztreonam 12/5 > 12/6 Imipenem>>>12/7, Merrem (change due to seiz) 12/7 >>    ABX as above Follow culture data PCT algorithm Trend WBC and fever curve  ENDOCRINE A:   Profound hyperglycemia with history of DM, 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 Likely multifactorial in setting resp failure, hyperglycemia, sedating meds.  H/o ICH Depression Anxiety Pseudobulbar affect H/o unspecified convusions  - acute enceph continues. S/p eizures 06/09/15 CNS meds on hold  P:   RASS goal: 0 Propofol gtt Holding home, lamictal, gabapentin, fentanyl patch, tizanidine, nuedexta, celexa Continue home Keppra PEr neuri DC fent gtt Use fent and versed prn   FAMILY  - Updates: no family available 06/09/15 and 06/20/15. Will place CVL  - Inter-disciplinary family meet or Palliative Care meeting due by:  12/12      The patient is critically ill with multiple organ systems failure and requires high complexity decision making for assessment and support, frequent evaluation and titration of therapies, application of advanced monitoring technologies and extensive interpretation of multiple databases.   Critical Care Time devoted to patient care services described in this note is  30  Minutes. This time reflects time of care of this signee Dr Brand Males. This critical care time does not reflect procedure time, or teaching time or supervisory time of  PA/NP/Med student/Med Resident etc but could involve care discussion time    Dr. Brand Males, M.D., [facility].C.P Pulmonary and Critical Care Medicine Staff Physician [place] [facility] Pager: [phone], If no answer or between  15:00h - 7:00h: call  [phone]  [id number]  06/10/2015 10:57 AM

## 2015-06-10 NOTE — Progress Notes (Signed)
CRITICAL VALUE ALERT  Critical value received:  Lactic Acid 2.3  Date of notification:  06/10/15  Time of notification:  [id number]   Critical value read back:Yes.    Nurse who received alert:  Wyline Beady, RN  MD notified (1st page):  Elink RN to relay value to MD. (value is trending down)

## 2015-06-10 NOTE — Progress Notes (Signed)
SLP Cancellation Note  Patient Details Name: Justin [name] MRN: [id number] DOB: April 12, 1956   Cancelled treatment:       Reason Eval/Treat Not Completed: Patient not medically ready. Pt remains intubated - will follow for readiness.   Germain Osgood, M.A. CCC-SLP [phone]  Germain Osgood 06/10/2015, 8:49 AM

## 2015-06-10 NOTE — Progress Notes (Signed)
Subjective: No seizures noted. I disucssed with his health care facility and he has significant language difficulty, requiring physical demonstration to follow commands even at baseline.   Exam: Filed Vitals:   06/10/15 0940 06/10/15 1000  BP:  120/69  Pulse: 79 80  Temp:    Resp: 16 18   Gen: In bed, NAD Resp: non-labored breathing, no acute distress Abd: soft, nt  Neuro: MS: awake, does not follow commands.  [id number], appears to have right gaze preference but does fixate and track.  Motor: right hemipareis Sensory:responds to sitm on the left.    Impression: 59 yo M with baseline deficits due to previous CNS ICH as a comlication of biopsy, cns B-cell lymphoma, rain irradiation, who had a breakthrough seizure in the setting of imipenem use.   Recommendations: 1) I would favor avoiding imipenem.  2) Continue home dose AEDs.  3) Please call with any further questions or concerns.   Roland Rack, MD [facility] [phone]  If 7pm- 7am, please page neurology on call as listed in [place].

## 2015-06-10 NOTE — Procedures (Signed)
Central Venous Catheter Insertion Procedure Note Justin [name] [id number] 07-Jul-1955  Procedure: Insertion of Central Venous Catheter Indications: Assessment of intravascular volume, Drug and/or fluid administration and Frequent blood sampling  Procedure Details Consent: Unable to obtain consent because of altered level of consciousness. Time Out: Verified patient identification, verified procedure, site/side was marked, verified correct patient position, special equipment/implants available, medications/allergies/relevent history reviewed, required imaging and test results available.  Performed  Maximum sterile technique was used including antiseptics, cap, gloves, gown, hand hygiene, mask and sheet. Skin prep: Chlorhexidine; local anesthetic administered A antimicrobial bonded/coated triple lumen catheter was placed in the left internal jugular vein using the Seldinger technique.  Evaluation Blood flow good Complications: No apparent complications Patient did tolerate procedure well. Chest X-ray ordered to verify placement.  CXR: pending.  Procedure performed under direct ultrasound guidance for real time vessel cannulation.      Montey Hora, [place] [facility] Pager: [phone]  or [phone] 06/10/2015, 12:13 PM

## 2015-06-11 LAB — GABAPENTIN LEVEL: GABAPENTIN LVL: 2.6 ug/mL

## 2015-06-11 LAB — BASIC METABOLIC PANEL
ANION GAP: 7 (ref 5–15)
BUN: 47 mg/dL — ABNORMAL HIGH (ref 6–20)
CO2: 23 mmol/L (ref 22–32)
Calcium: 9.2 mg/dL (ref 8.9–10.3)
Chloride: 117 mmol/L — ABNORMAL HIGH (ref 101–111)
Creatinine, Ser: 1.93 mg/dL — ABNORMAL HIGH (ref 0.61–1.24)
GFR calc Af Amer: 41 mL/min — ABNORMAL LOW (ref >60)
GFR calc non Af Amer: 36 mL/min — ABNORMAL LOW (ref >60)
GLUCOSE: 204 mg/dL — AB (ref 65–99)
POTASSIUM: 3.3 mmol/L — AB (ref 3.5–5.1)
Sodium: 147 mmol/L — ABNORMAL HIGH (ref 135–145)

## 2015-06-11 LAB — GLUCOSE, CAPILLARY
GLUCOSE-CAPILLARY: 111 mg/dL — AB (ref 65–99)
GLUCOSE-CAPILLARY: 125 mg/dL — AB (ref 65–99)
GLUCOSE-CAPILLARY: 126 mg/dL — AB (ref 65–99)
GLUCOSE-CAPILLARY: 127 mg/dL — AB (ref 65–99)
GLUCOSE-CAPILLARY: 156 mg/dL — AB (ref 65–99)
GLUCOSE-CAPILLARY: 158 mg/dL — AB (ref 65–99)
GLUCOSE-CAPILLARY: 166 mg/dL — AB (ref 65–99)
GLUCOSE-CAPILLARY: 177 mg/dL — AB (ref 65–99)
GLUCOSE-CAPILLARY: 179 mg/dL — AB (ref 65–99)
GLUCOSE-CAPILLARY: 189 mg/dL — AB (ref 65–99)
GLUCOSE-CAPILLARY: 190 mg/dL — AB (ref 65–99)
GLUCOSE-CAPILLARY: 190 mg/dL — AB (ref 65–99)
GLUCOSE-CAPILLARY: 229 mg/dL — AB (ref 65–99)
GLUCOSE-CAPILLARY: 231 mg/dL — AB (ref 65–99)
Glucose-Capillary: 116 mg/dL — ABNORMAL HIGH (ref 65–99)
Glucose-Capillary: 140 mg/dL — ABNORMAL HIGH (ref 65–99)
Glucose-Capillary: 143 mg/dL — ABNORMAL HIGH (ref 65–99)
Glucose-Capillary: 143 mg/dL — ABNORMAL HIGH (ref 65–99)
Glucose-Capillary: 145 mg/dL — ABNORMAL HIGH (ref 65–99)
Glucose-Capillary: 156 mg/dL — ABNORMAL HIGH (ref 65–99)
Glucose-Capillary: 163 mg/dL — ABNORMAL HIGH (ref 65–99)
Glucose-Capillary: 169 mg/dL — ABNORMAL HIGH (ref 65–99)
Glucose-Capillary: 175 mg/dL — ABNORMAL HIGH (ref 65–99)
Glucose-Capillary: 176 mg/dL — ABNORMAL HIGH (ref 65–99)
Glucose-Capillary: 221 mg/dL — ABNORMAL HIGH (ref 65–99)

## 2015-06-11 LAB — CULTURE, BLOOD (ROUTINE X 2)

## 2015-06-11 LAB — CBC WITH DIFFERENTIAL/PLATELET
BASOS ABS: 0 10*3/uL (ref 0.0–0.1)
Basophils Relative: 0 %
Eosinophils Absolute: 0.1 10*3/uL (ref 0.0–0.7)
Eosinophils Relative: 1 %
HEMATOCRIT: 40.8 % (ref 39.0–52.0)
Hemoglobin: 12.8 g/dL — ABNORMAL LOW (ref 13.0–17.0)
LYMPHS PCT: 17 %
Lymphs Abs: 1.3 10*3/uL (ref 0.7–4.0)
MCH: 27.5 pg (ref 26.0–34.0)
MCHC: 31.4 g/dL (ref 30.0–36.0)
MCV: 87.7 fL (ref 78.0–100.0)
MONO ABS: 0.2 10*3/uL (ref 0.1–1.0)
Monocytes Relative: 3 %
NEUTROS ABS: 6.1 10*3/uL (ref 1.7–7.7)
Neutrophils Relative %: 80 %
Platelets: 102 10*3/uL — ABNORMAL LOW (ref 150–400)
RBC: 4.65 MIL/uL (ref 4.22–5.81)
RDW: 14.8 % (ref 11.5–15.5)
WBC: 7.7 10*3/uL (ref 4.0–10.5)

## 2015-06-11 LAB — PHOSPHORUS: Phosphorus: 2.4 mg/dL — ABNORMAL LOW (ref 2.5–4.6)

## 2015-06-11 LAB — MAGNESIUM: Magnesium: 2.4 mg/dL (ref 1.7–2.4)

## 2015-06-11 LAB — LEVETIRACETAM LEVEL: Levetiracetam Lvl: 20 ug/mL (ref 10.0–40.0)

## 2015-06-11 LAB — LACTIC ACID, PLASMA: LACTIC ACID, VENOUS: 1 mmol/L (ref 0.5–2.0)

## 2015-06-11 MED ORDER — FUROSEMIDE 10 MG/ML IJ SOLN
20.0000 mg | Freq: Once | INTRAMUSCULAR | Status: AC
  Administered 2015-06-11: 20 mg via INTRAVENOUS
  Filled 2015-06-11: qty 2

## 2015-06-11 MED ORDER — SODIUM CHLORIDE 0.9 % IV SOLN
INTRAVENOUS | Status: DC
  Administered 2015-06-11 – 2015-06-12 (×2): via INTRAVENOUS
  Administered 2015-06-14: 10 mL/h via INTRAVENOUS
  Administered 2015-06-14 – 2015-06-19 (×4): via INTRAVENOUS
  Administered 2015-06-19 (×2): 500 mL via INTRAVENOUS

## 2015-06-11 MED ORDER — GABAPENTIN 100 MG PO CAPS: 100.0000 mg | ORAL_CAPSULE | Freq: Three times a day (TID) | ORAL | Status: DC

## 2015-06-11 MED ORDER — DEXTROSE-NACL 5-0.45 % IV SOLN
INTRAVENOUS | Status: DC
  Administered 2015-06-11: 1000 mL via INTRAVENOUS

## 2015-06-11 MED ORDER — INSULIN REGULAR HUMAN 100 UNIT/ML IJ SOLN: INTRAMUSCULAR | Status: DC

## 2015-06-11 MED ORDER — FLUCONAZOLE IN SODIUM CHLORIDE 100-0.9 MG/50ML-% IV SOLN
100.0000 mg | INTRAVENOUS | Status: DC
  Administered 2015-06-12: 100 mg via INTRAVENOUS
  Filled 2015-06-11 (×5): qty 50

## 2015-06-11 MED ORDER — INSULIN ASPART 100 UNIT/ML ~~LOC~~ SOLN: 0.0000 [IU] | Freq: Three times a day (TID) | SUBCUTANEOUS | Status: DC

## 2015-06-11 MED ORDER — INSULIN GLARGINE 100 UNIT/ML ~~LOC~~ SOLN
6.0000 [IU] | Freq: Two times a day (BID) | SUBCUTANEOUS | Status: DC
  Filled 2015-06-11: qty 0.06

## 2015-06-11 MED ORDER — INSULIN ASPART 100 UNIT/ML ~~LOC~~ SOLN
0.0000 [IU] | SUBCUTANEOUS | Status: DC
  Administered 2015-06-11: 2 [IU] via SUBCUTANEOUS
  Administered 2015-06-12: 7 [IU] via SUBCUTANEOUS
  Administered 2015-06-12 (×2): 3 [IU] via SUBCUTANEOUS
  Administered 2015-06-12 (×2): 5 [IU] via SUBCUTANEOUS
  Administered 2015-06-13 (×2): 9 [IU] via SUBCUTANEOUS
  Administered 2015-06-13 (×2): 3 [IU] via SUBCUTANEOUS
  Administered 2015-06-13: 9 [IU] via SUBCUTANEOUS
  Administered 2015-06-13 – 2015-06-14 (×2): 3 [IU] via SUBCUTANEOUS
  Administered 2015-06-14: 9 [IU] via SUBCUTANEOUS
  Administered 2015-06-14: 7 [IU] via SUBCUTANEOUS
  Administered 2015-06-14: 3 [IU] via SUBCUTANEOUS
  Administered 2015-06-14: 2 [IU] via SUBCUTANEOUS

## 2015-06-11 MED ORDER — GERHARDT'S BUTT CREAM
TOPICAL_CREAM | Freq: Two times a day (BID) | CUTANEOUS | Status: DC
  Administered 2015-06-11: 22:00:00 via TOPICAL
  Administered 2015-06-12 (×2): 1 via TOPICAL
  Administered 2015-06-13 – 2015-06-23 (×21): via TOPICAL
  Filled 2015-06-11 (×2): qty 1

## 2015-06-11 MED ORDER — INSULIN GLARGINE 100 UNIT/ML ~~LOC~~ SOLN
6.0000 [IU] | Freq: Two times a day (BID) | SUBCUTANEOUS | Status: DC
  Administered 2015-06-11 – 2015-06-13 (×4): 6 [IU] via SUBCUTANEOUS
  Filled 2015-06-11 (×5): qty 0.06

## 2015-06-11 MED ORDER — INSULIN GLARGINE 100 UNIT/ML ~~LOC~~ SOLN
5.0000 [IU] | Freq: Every day | SUBCUTANEOUS | Status: DC
  Filled 2015-06-11: qty 0.05

## 2015-06-11 MED ORDER — FLUCONAZOLE IN SODIUM CHLORIDE 200-0.9 MG/100ML-% IV SOLN
200.0000 mg | Freq: Once | INTRAVENOUS | Status: AC
  Administered 2015-06-11: 200 mg via INTRAVENOUS
  Filled 2015-06-11: qty 100

## 2015-06-11 MED ORDER — GABAPENTIN 250 MG/5ML PO SOLN
100.0000 mg | Freq: Three times a day (TID) | ORAL | Status: DC
  Administered 2015-06-11 – 2015-06-23 (×34): 100 mg
  Filled 2015-06-11 (×41): qty 2

## 2015-06-11 MED ORDER — POTASSIUM CHLORIDE 20 MEQ/15ML (10%) PO SOLN
40.0000 meq | Freq: Two times a day (BID) | ORAL | Status: AC
  Administered 2015-06-11 (×2): 40 meq
  Filled 2015-06-11 (×2): qty 30

## 2015-06-11 NOTE — Progress Notes (Signed)
eLink Physician-Brief Progress Note Patient Name: GLENDEL DONDIEGO DOB: [birth year] MRN: [id number]   Date of Service  06/11/2015  HPI/Events of Note  Sugars steady at 140-160 on insulin drip  eICU Interventions  Will go ahead and give lantus bid and ssi and stop the drip      Intervention Category Major Interventions: Hyperglycemia - active titration of insulin therapy  Azlan Augusto 06/11/2015, 8:09 PM

## 2015-06-11 NOTE — Progress Notes (Signed)
RT called to pt's room due to increased agitation. Pt placed back on full vent support. RT will continue to monitor.

## 2015-06-11 NOTE — Progress Notes (Addendum)
PULMONARY / CRITICAL CARE MEDICINE   Name: Justin [name] MRN: [id number] DOB: 04-26-1956    ADMISSION DATE:  06/08/2015 CONSULTATION DATE:  06/08/15  REFERRING MD:  EDP  CHIEF COMPLAINT:  Agonal breathing  BRIEF Justin [name] is a 59 y.o. 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  He was brought to [facility] evening of 12/5 after staff at SNF found him agonally breathing since 3 PM.  In ED, he was completely unresponsive and agonal.  He subsequently required intubation for airway protection.  During intubation, a piece of tissue paper was removed from pt's posterior pharynx.  He had recent admission 04/05/15 through 04/10/15 for urosepsis.  06/09/15: on fent 111mcg -> did do some commands on left side (old stroke - right side weak). Intermitent oral seizures noticed by RN. On Keppra. Other seizure meds on hold. RNs ays he was found agonal with tissue paper in mouth. Making good urine with drown trend increat  06/10/15 - not on pressors. Failed SBT. Neuro thinks is baseline. Neuro recs change imipenem due to seizure. No further seizures noted. RN requesting CVL. GPC in blood    SUBJECTIVE/OVERNIGHT/INTERVAL HX 06/11/15: not on pressors. Doing SBT but this is on diprivan gtt. Non-verbal with right weakness neuro says is baseline (at SNF needed physical demo to follow simple commands) . RN says a daughter visited last night. Improving creat  VITAL SIGNS: BP 113/68 mmHg  Pulse 93  Temp(Src) 97.8 F (36.6 C) (Oral)  Resp 15  Ht 6' (1.829 m)  Wt 83.3 kg (183 lb 10.3 oz)  BMI 24.90 kg/m2  SpO2 100%  HEMODYNAMICS:    VENTILATOR SETTINGS: Vent Mode:  [-] PSV;CPAP FiO2 (%):  [40 %] 40 % Set Rate:  [12 bmp] 12 bmp Vt Set:  [620 mL] 620 mL PEEP:  [5  cmH20] 5 cmH20 Pressure Support:  [10 cmH20] 10 cmH20 Plateau Pressure:  [14 cmH20-22 cmH20] 14 cmH20  INTAKE / OUTPUT: I/O last 3 completed shifts: In: [id number] [I.V.:6031; Other:240; NG/GT:3240; IV Piggyback:952] Out: 3365 [Urine:3365]  PHYSICAL EXAMINATION: General: Chronically ill appearing male, in NAD. Neuro: Currently Awake and stares but no follow commands. RASS 0 to -1 equivalent on diprivan gtt. Right side weak (mistaknely charted 06/10/15 as left weakness) HEENT: [place]/AT. PERRL. Cardiovascular: RRR, no M/R/G.  Lungs: Respirations even and unlabored.  Coarse bilaterally. Abdomen: PEG in place.  BS x 4, soft, NT/ND.  Musculoskeletal: No gross deformities, no edema.  Skin: Intact, warm, no rashes. Bilateral UE AV fistulas with no bruit or thrill .   LABS: PULMONARY  Recent Labs Lab 06/08/15 2021 06/09/15 0428  PHART 7.466* 7.547*  PCO2ART 36.3 21.3*  PO2ART 381.0* 160*  HCO3 26.2* 18.5*  TCO2 27 19.2  O2SAT 100.0 99.4    CBC  Recent Labs Lab 06/09/15 0436 06/10/15 0044 06/11/15 0407  HGB 15.0 13.2 12.8*  HCT 50.2 43.2 40.8  WBC 11.5* 10.0 7.7  PLT 147* 129* 102*    COAGULATION No results for input(s): INR in the last 168 hours.  CARDIAC    Recent Labs Lab 06/09/15 0030 06/09/15 0640  TROPONINI 0.14* 0.13*   No results for input(s): PROBNP in the last 168 hours.   CHEMISTRY  Recent Labs Lab 06/09/15 [phone] 06/09/15 0640 06/09/15 1448  06/10/15 0044 06/11/15 0407  NA 150* 151* 156* 149* 147*  K 3.8 3.5 3.1* 3.6 3.3*  CL 118* 119* 120* 116* 117*  CO2 18* 19* 24 22 23   GLUCOSE 620* 497* 84 246* 204*  BUN 66* 64* 59* 62* 47*  CREATININE 2.98* 2.83* 2.51* 2.51* 1.93*  CALCIUM 10.2 10.1 10.2 9.5 9.2  MG 2.5*  --   --  2.2 2.4  PHOS 1.1*  --   --  2.1* 2.4*   Estimated Creatinine Clearance: 44.1 mL/min (by C-G formula based on Cr of 1.93).   LIVER  Recent Labs Lab 06/08/15 1840  AST 44*  ALT 30  ALKPHOS 91  BILITOT 1.1  PROT 9.0*   ALBUMIN 3.2*     INFECTIOUS  Recent Labs Lab 06/08/15 2134  06/09/15 0436  06/09/15 1140 06/10/15 0044 06/11/15  LATICACIDVEN  --   < > 5.5*  < > 3.8* 2.3* 1.0  PROCALCITON 0.24  --  0.70  --   --  0.83  --   < > = values in this interval not displayed.   ENDOCRINE CBG (last 3)   Recent Labs  06/11/15 0400 06/11/15 0501 06/11/15 0556  GLUCAP 190* 158* 163*         IMAGING x48h  - image(Dg Chest Port 1 View  06/10/2015  CLINICAL DATA:  Post central line placement EXAM: PORTABLE CHEST 1 VIEW COMPARISON:  06/10/2015 FINDINGS: Left central line tip is in the SVC. No pneumothorax. NG tube tip is in the distal esophagus. Endotracheal tube is 3 cm above the carina. Left lower lobe atelectasis or infiltrate, not significantly changed since prior study. Right lung is clear. Low lung volumes. IMPRESSION: Left central line tip in the SVC. No pneumothorax. NG tube tip is in the distal esophagus. Continued left lower lobe atelectasis or infiltrate, not significantly changed. Electronically Signed   By: Rolm Baptise M.D.   On: 06/10/2015 12:52   Dg Chest Port 1 View  06/10/2015  CLINICAL DATA:  Intubation. EXAM: PORTABLE CHEST 1 VIEW COMPARISON:  06/09/2015. FINDINGS: Endotracheal tube in stable position. The NG tube is been withdrawn, its tip is at the gastroesophageal junction. Advancement suggested. Mild cardiomegaly. Bibasilar atelectasis and infiltrates particular prominent left lung base. These findings are noted. Small left pleural effusion cannot be excluded. Right costophrenic angle not imaged. No pneumothorax. IMPRESSION: 1. Endotracheal tube in stable position. 2. Interim partial withdrawal of NG tube, its tip is at the gastroesophageal junction. Further advancement of approximately 10 cm suggested. 3. Bibasilar atelectasis and infiltrates, particular prominent in the left lung base . Small left pleural effusion cannot be exclude. These are new findings. Critical Value/emergent  results were called by telephone at the time of interpretation on 06/10/2015 at 7:32 am to nurse Kathlee Nations, who verbally acknowledged these results. Electronically Signed   By: Marcello Moores  Register   On: 06/10/2015 07:34   Results for orders placed or performed during the hospital encounter of 06/08/15  Culture, blood (routine x 2)     Status: None   Collection Time: 06/08/15  6:40 PM  Result Value Ref Range Status   Specimen Description BLOOD RIGHT HAND  Final   Special Requests BOTTLES DRAWN AEROBIC ONLY 8CC  Final   Culture  Setup Time   Final    GRAM POSITIVE COCCI IN CLUSTERS AEROBIC BOTTLE ONLY CRITICAL RESULT CALLED TO, READ BACK BY AND VERIFIED WITH: S [facility] 06/09/15 @ [place]   Final  STAPHYLOCOCCUS SPECIES (COAGULASE NEGATIVE) THE SIGNIFICANCE OF ISOLATING THIS ORGANISM FROM A SINGLE SET OF BLOOD CULTURES WHEN MULTIPLE SETS ARE DRAWN IS UNCERTAIN. PLEASE NOTIFY THE MICROBIOLOGY DEPARTMENT WITHIN ONE WEEK IF SPECIATION AND SENSITIVITIES ARE REQUIRED.    Report Status 06/11/2015 FINAL  Final  Culture, blood (routine x 2)     Status: None (Preliminary result)   Collection Time: 06/08/15  9:18 PM  Result Value Ref Range Status   Specimen Description BLOOD LEFT HAND  Final   Special Requests BOTTLES DRAWN AEROBIC AND ANAEROBIC 5CC  Final   Culture NO GROWTH 2 DAYS  Final   Report Status PENDING  Incomplete  MRSA PCR Screening     Status: None   Collection Time: 06/09/15  4:41 AM  Result Value Ref Range Status   MRSA by PCR NEGATIVE NEGATIVE Final    Comment:        The GeneXpert MRSA Assay (FDA approved for NASAL specimens only), is one component of a comprehensive MRSA colonization surveillance program. It is not intended to diagnose MRSA infection nor to guide or monitor treatment for MRSA infections.   Urine culture     Status: None   Collection Time: 06/09/15  7:11 AM  Result Value Ref Range Status   Specimen Description URINE, CATHETERIZED  Final   Special  Requests NONE  Final   Culture >=100,000 COLONIES/mL YEAST  Final   Report Status 06/10/2015 FINAL  Final  Culture, respiratory (tracheal aspirate)     Status: None (Preliminary result)   Collection Time: 06/09/15  4:35 PM  Result Value Ref Range Status   Specimen Description TRACHEAL ASPIRATE  Final   Special Requests NONE  Final   Gram Stain   Final    ABUNDANT WBC PRESENT, PREDOMINANTLY PMN NO SQUAMOUS EPITHELIAL CELLS SEEN ABUNDANT GRAM NEGATIVE RODS FEW GRAM POSITIVE COCCI IN PAIRS FEW YEAST    Culture   Final    Culture reincubated for better growth Performed at [facility]    Report Status PENDING  Incomplete      DISCUSSION: 59 year old male, SNF resident with complicated medical hx, presented to [facility] ED 12/5 with AMS after several hours of "agonal breathing". Suspect there could possibly be an element of aspiration as he has a history of dysphagia and was discharged to SNF with a diet. A tissue was also found in posterior pharynx during intubation. However, I think that this is more likely dehydration based on AKI, hemoconcentration, hypernatremia, and hyperglycemia. 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 Will hydrate somewhat aggressively, treat empirically for infection, and initiate insulin gtt. Will also hold sedating outpatient meds, of which he takes several. Trend troponin and repeat EKG due to elevated troponin in ED.   ASSESSMENT / PLAN:  PULMONARY A: Acute hypoxemic respiratory failure secondary to suspected aspiration pneumonitis   - doing SBT but this on diprivan gtt  P:   Full vent support CXR in AM Follow ABG Vent bundle  CARDIOVASCULAR A:  Hx of HTN Mild Elevated troponin - likely demand (normal echo oct 2016)   P:  Telemetry Hold home hydralazine, isosorbide, amlodipine Continue home ASA, atorvastatin    RENAL A:   Acute on CKD status post cadaveric renal transplant, not-on HD.    - improved/improving - Now looks a bit volume overloaded extravascular  but intra-vas deplete (Na 147) Hypo-kalemia + P:   Cut down fluid rate to KVO from 125cc/h Cotninue free flushes 2101mL q6h increase LASIX x 1 Replete K Follow Bmet  Continue tacrolimun/stress steroids   GASTROINTESTINAL A:   GERD PEG status  - toelrating PEG feeds but has loose stool due to TF  P:   NPO besides meds via PEG  And TF via peg PPI as per home regimen Carafate per tube OG to gravity Flexi0seal  HEMATOLOGIC A:   H/o B cell lymphoma s/p radiation  P:  Follow CBC - PRBC for hgb < 7gm% Heparin for VTE ppx  INFECTIOUS  A:   CULTURES: Blood 12/5 > Sputum 12/5 > Urine 12/5 > YEAST 100K    Concern for aspiration PNA vs HCAP Immunosuppressed after renal tx, on tacrolimus /chronic prd  P:   ANTIBIOTICS: Vanc 12/5 > Aztreonam 12/5 > 12/6 Imipenem>>>12/7, Merrem (change due to seiz) 12/7 >> Diflucan 12/8 (yeast in Urine) >>    ABX as above Follow culture data PCT algorithm Trend WBC and fever curve  ENDOCRINE A:   Profound hyperglycemia with history of DM, 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 Likely multifactorial in setting resp failure, hyperglycemia, sedating meds.  H/o ICH Depression Anxiety Pseudobulbar affect H/o unspecified convusions  - acute enceph continues. S/p eizures 06/09/15 CNS meds on hold  P:   RASS goal: 0 Propofol gtt - hold 06/11/2015 to aim for extubation assessment Holding home, lamictal, gabapentin, fentanyl patch, tizanidine, nuedexta, celexa Continue home Keppra PEr neuri DC fent gtt Use fent and versed prn   FAMILY  - Updates: no family available 06/09/15 and 06/20/15 and 06/11/15 . Apparently daughter visited late on 06/10/15. On 06/11/15 - Dr MR called April [place] daughter [place] daughter [phone] - Faith is HCPOA - she denied any questions. I asked her goal -> she said goal is for full recovery and back to SNF. She refused to entertain any other conversation  - Inter-disciplinary family meet or Palliative Care meeting due by:  12/12      The patient is critically ill with multiple organ systems failure and requires high complexity decision making for assessment and support, frequent evaluation and titration of therapies, application of advanced monitoring technologies and extensive interpretation of multiple databases.   Critical Care Time devoted to patient care services described in this note is  30  Minutes. This time reflects time of care of this signee Dr Brand Males. This critical care time does not reflect procedure time, or teaching time or supervisory time of PA/NP/Med student/Med Resident etc but could involve care discussion time    Dr. Brand Males, M.D., [facility].C.P Pulmonary and Critical Care Medicine Staff Physician [place] [facility] Pager: [phone], If no answer or between  15:00h - 7:00h: call [phone]  06/11/2015 9:16 AM

## 2015-06-11 NOTE — Progress Notes (Signed)
ANTIBIOTIC CONSULT NOTE - FOLLOW UP  Pharmacy Consult for Vancomycin, Meropenem, fluconazole Indication: HCAP vs aspiration PNA, fungal UTI  Allergies  Allergen Reactions  . Amoxicillin Other (See Comments)    ON MAR  . Ampicillin Other (See Comments)    ON MAR  . Latex Other (See Comments)    ON MAR  . Morphine And Related Other (See Comments)    UNK  . Penicillins Other (See Comments)    ON MAR; tolerated Ceftriaxone 8/27-9/1  . Tape Other (See Comments)    ON MAR    Patient Measurements: Height: 6' (182.9 cm) Weight: 183 lb 10.3 oz (83.3 kg) IBW/kg (Calculated) : 77.6   Vital Signs: Temp: 97.8 F (36.6 C) (12/08 0805) Temp Source: Oral (12/08 0805) BP: 119/85 mmHg (12/08 1000) Pulse Rate: 96 (12/08 1000) Intake/Output from previous day: 12/07 0701 - 12/08 0700 In: 7223.5 [I.V.:4111.5; NG/GT:2300; IV Piggyback:602] Out: 3025 [Urine:3025] Intake/Output from this shift: Total I/O In: 486.1 [I.V.:336.1; NG/GT:150] Out: 675 [Urine:475; Emesis/NG output:200]  Labs:  Recent Labs  06/09/15 0436  06/09/15 0712 06/09/15 1448 06/10/15 0044 06/11/15 0407  WBC 11.5*  --   --   --  10.0 7.7  HGB 15.0  --   --   --  13.2 12.8*  PLT 147*  --   --   --  129* 102*  LABCREA  --   --  98.36  --   --   --   CREATININE 2.98*  < >  --  2.51* 2.51* 1.93*  < > = values in this interval not displayed. Estimated Creatinine Clearance: 44.1 mL/min (by C-G formula based on Cr of 1.93). No results for input(s): VANCOTROUGH, VANCOPEAK, VANCORANDOM, GENTTROUGH, GENTPEAK, GENTRANDOM, TOBRATROUGH, TOBRAPEAK, TOBRARND, AMIKACINPEAK, AMIKACINTROU, AMIKACIN in the last 72 hours.   Microbiology: Recent Results (from the past 720 hour(s))  Culture, blood (routine x 2)     Status: None   Collection Time: 06/08/15  6:40 PM  Result Value Ref Range Status   Specimen Description BLOOD RIGHT HAND  Final   Special Requests BOTTLES DRAWN AEROBIC ONLY 8CC  Final   Culture  Setup Time   Final   GRAM POSITIVE COCCI IN CLUSTERS AEROBIC BOTTLE ONLY CRITICAL RESULT CALLED TO, READ BACK BY AND VERIFIED WITH: S [facility] 06/09/15 @ 52 M [place]    Culture   Final    STAPHYLOCOCCUS SPECIES (COAGULASE NEGATIVE) THE SIGNIFICANCE OF ISOLATING THIS ORGANISM FROM A SINGLE SET OF BLOOD CULTURES WHEN MULTIPLE SETS ARE DRAWN IS UNCERTAIN. PLEASE NOTIFY THE MICROBIOLOGY DEPARTMENT WITHIN ONE WEEK IF SPECIATION AND SENSITIVITIES ARE REQUIRED.    Report Status 06/11/2015 FINAL  Final  Culture, blood (routine x 2)     Status: None (Preliminary result)   Collection Time: 06/08/15  9:18 PM  Result Value Ref Range Status   Specimen Description BLOOD LEFT HAND  Final   Special Requests BOTTLES DRAWN AEROBIC AND ANAEROBIC 5CC  Final   Culture NO GROWTH 2 DAYS  Final   Report Status PENDING  Incomplete  MRSA PCR Screening     Status: None   Collection Time: 06/09/15  4:41 AM  Result Value Ref Range Status   MRSA by PCR NEGATIVE NEGATIVE Final    Comment:        The GeneXpert MRSA Assay (FDA approved for NASAL specimens only), is one component of a comprehensive MRSA colonization surveillance program. It is not intended to diagnose MRSA infection nor to guide or monitor treatment for MRSA  infections.   Urine culture     Status: None   Collection Time: 06/09/15  7:11 AM  Result Value Ref Range Status   Specimen Description URINE, CATHETERIZED  Final   Special Requests NONE  Final   Culture >=100,000 COLONIES/mL YEAST  Final   Report Status 06/10/2015 FINAL  Final  Culture, respiratory (tracheal aspirate)     Status: None (Preliminary result)   Collection Time: 06/09/15  4:35 PM  Result Value Ref Range Status   Specimen Description TRACHEAL ASPIRATE  Final   Special Requests NONE  Final   Gram Stain   Final    ABUNDANT WBC PRESENT, PREDOMINANTLY PMN NO SQUAMOUS EPITHELIAL CELLS SEEN ABUNDANT GRAM NEGATIVE RODS FEW GRAM POSITIVE COCCI IN PAIRS FEW YEAST    Culture   Final    Culture  reincubated for better growth Performed at [facility]    Report Status PENDING  Incomplete    Anti-infectives    Start     Dose/Rate Route Frequency Ordered Stop   06/10/15 1230  meropenem (MERREM) 1 g in sodium chloride 0.9 % 100 mL IVPB     1 g 200 mL/hr over 30 Minutes Intravenous Every 12 hours 06/10/15 1209     06/10/15 0000  vancomycin (VANCOCIN) IVPB 750 mg/150 ml premix     750 mg 150 mL/hr over 60 Minutes Intravenous Every 24 hours 06/09/15 0611     06/09/15 0615  imipenem-cilastatin (PRIMAXIN) 250 mg in sodium chloride 0.9 % 100 mL IVPB  Status:  Discontinued     250 mg 200 mL/hr over 30 Minutes Intravenous 4 times per day 06/09/15 0611 06/10/15 1056   06/08/15 2000  vancomycin (VANCOCIN) 1,500 mg in sodium chloride 0.9 % 500 mL IVPB     1,500 mg 250 mL/hr over 120 Minutes Intravenous  Once 06/08/15 1958 06/08/15 2239   06/08/15 2000  aztreonam (AZACTAM) 1 g in dextrose 5 % 50 mL IVPB  Status:  Discontinued     1 g 100 mL/hr over 30 Minutes Intravenous Every 8 hours 06/08/15 1958 06/09/15 0559      Assessment: 59 yo M presented on 12/5 with AMS and agonal breathing. Pt started on empiric antibiotics for HCAP vs aspiration pna coverage. WBC= 7.7, afebrile, SCr= 1.93 (trend down) and CrCl ~ 44 -adding fluconazole for fungal UTI  Vanc 12/5 > Aztreonam 12/5 > 12/6 Primax 12/6 > 12/7 Meropenem 12/7 >  12/5 blood x 2 >> 1/2 CONS 12/6 urine: yeast > 100K 12/6 MRSA neg 12/6 TA >> abundant GNR, few GPC, few yeast  Goal of Therapy:  Vancomycin trough level 15-20 mcg/ml  Plan:  -With renal function will given fluconazole 200mg  IV x1 followed by 100mg  IV q24hr -No meropenem adjustments now -Will follow renal function, cultures and clinical progress  Hildred Laser, Pharm D 06/11/2015 10:27 AM

## 2015-06-11 NOTE — Progress Notes (Signed)
SLP Cancellation Note  Patient Details Name: Justin [name] MRN: [id number] DOB: July 08, 1955   Cancelled treatment:       Reason Eval/Treat Not Completed: Patient not medically ready. Intubated, will sign off at this time. Please reorder when ready.    Caitlyn Buchanan, Katherene Ponto 06/11/2015, 7:37 AM

## 2015-06-12 ENCOUNTER — Inpatient Hospital Stay (HOSPITAL_COMMUNITY): Payer: Medicare Other

## 2015-06-12 LAB — GLUCOSE, CAPILLARY
GLUCOSE-CAPILLARY: 229 mg/dL — AB (ref 65–99)
GLUCOSE-CAPILLARY: 269 mg/dL — AB (ref 65–99)
Glucose-Capillary: 240 mg/dL — ABNORMAL HIGH (ref 65–99)
Glucose-Capillary: 259 mg/dL — ABNORMAL HIGH (ref 65–99)
Glucose-Capillary: 330 mg/dL — ABNORMAL HIGH (ref 65–99)

## 2015-06-12 LAB — BASIC METABOLIC PANEL
ANION GAP: 5 (ref 5–15)
BUN: 34 mg/dL — ABNORMAL HIGH (ref 6–20)
CALCIUM: 9.7 mg/dL (ref 8.9–10.3)
CO2: 24 mmol/L (ref 22–32)
Chloride: 118 mmol/L — ABNORMAL HIGH (ref 101–111)
Creatinine, Ser: 1.83 mg/dL — ABNORMAL HIGH (ref 0.61–1.24)
GFR calc Af Amer: 44 mL/min — ABNORMAL LOW (ref >60)
GFR, EST NON AFRICAN AMERICAN: 38 mL/min — AB (ref >60)
GLUCOSE: 281 mg/dL — AB (ref 65–99)
Potassium: 4.5 mmol/L (ref 3.5–5.1)
SODIUM: 147 mmol/L — AB (ref 135–145)

## 2015-06-12 LAB — CBC WITH DIFFERENTIAL/PLATELET
BASOS ABS: 0 10*3/uL (ref 0.0–0.1)
BASOS PCT: 0 %
EOS ABS: 0.1 10*3/uL (ref 0.0–0.7)
EOS PCT: 1 %
HCT: 40.8 % (ref 39.0–52.0)
Hemoglobin: 12.9 g/dL — ABNORMAL LOW (ref 13.0–17.0)
LYMPHS PCT: 16 %
Lymphs Abs: 1.3 10*3/uL (ref 0.7–4.0)
MCH: 27.8 pg (ref 26.0–34.0)
MCHC: 31.6 g/dL (ref 30.0–36.0)
MCV: 87.9 fL (ref 78.0–100.0)
MONO ABS: 0.3 10*3/uL (ref 0.1–1.0)
Monocytes Relative: 4 %
Neutro Abs: 6.1 10*3/uL (ref 1.7–7.7)
Neutrophils Relative %: 79 %
PLATELETS: 101 10*3/uL — AB (ref 150–400)
RBC: 4.64 MIL/uL (ref 4.22–5.81)
RDW: 14.8 % (ref 11.5–15.5)
WBC: 7.7 10*3/uL (ref 4.0–10.5)

## 2015-06-12 LAB — MAGNESIUM: MAGNESIUM: 2.4 mg/dL (ref 1.7–2.4)

## 2015-06-12 LAB — PHOSPHORUS: Phosphorus: 1.8 mg/dL — ABNORMAL LOW (ref 2.5–4.6)

## 2015-06-12 MED ORDER — DEXMEDETOMIDINE HCL IN NACL 400 MCG/100ML IV SOLN
0.4000 ug/kg/h | INTRAVENOUS | Status: DC
  Administered 2015-06-12 (×3): 0.7 ug/kg/h via INTRAVENOUS
  Administered 2015-06-12: 0.5 ug/kg/h via INTRAVENOUS
  Administered 2015-06-12 – 2015-06-13 (×6): 0.7 ug/kg/h via INTRAVENOUS
  Administered 2015-06-14: 0.5 ug/kg/h via INTRAVENOUS
  Filled 2015-06-12 (×2): qty 50
  Filled 2015-06-12: qty 100
  Filled 2015-06-12 (×4): qty 50
  Filled 2015-06-12: qty 100
  Filled 2015-06-12 (×3): qty 50
  Filled 2015-06-12: qty 100

## 2015-06-12 MED ORDER — SODIUM CHLORIDE 0.9 % IJ SOLN: 10.0000 mL | INTRAMUSCULAR | Status: DC | PRN

## 2015-06-12 MED ORDER — SODIUM CHLORIDE 0.9 % IJ SOLN
10.0000 mL | Freq: Two times a day (BID) | INTRAMUSCULAR | Status: DC
  Administered 2015-06-12 – 2015-06-18 (×12): 10 mL
  Administered 2015-06-19: 30 mL
  Administered 2015-06-20 – 2015-06-22 (×3): 10 mL

## 2015-06-12 MED ORDER — POTASSIUM PHOSPHATES 15 MMOLE/5ML IV SOLN
24.0000 mmol | Freq: Once | INTRAVENOUS | Status: AC
  Administered 2015-06-12: 24 mmol via INTRAVENOUS
  Filled 2015-06-12: qty 8

## 2015-06-12 MED ORDER — FUROSEMIDE 10 MG/ML IJ SOLN
40.0000 mg | Freq: Two times a day (BID) | INTRAMUSCULAR | Status: DC
  Administered 2015-06-12 – 2015-06-13 (×3): 40 mg via INTRAVENOUS
  Filled 2015-06-12 (×3): qty 4

## 2015-06-12 MED ORDER — METOCLOPRAMIDE HCL 5 MG/ML IJ SOLN
5.0000 mg | Freq: Three times a day (TID) | INTRAMUSCULAR | Status: DC
  Administered 2015-06-12 – 2015-06-13 (×2): 5 mg via INTRAVENOUS
  Filled 2015-06-12 (×2): qty 2

## 2015-06-12 NOTE — Progress Notes (Signed)
Patient taken off wean to rest for the night.  Patient tolerating well.  RT will continue to follow.

## 2015-06-12 NOTE — Progress Notes (Signed)
[place] Progress Note Patient Name: Justin [name] DOB: September 16, 1955 MRN: [id number]   Date of Service  06/12/2015  HPI/Events of Note  Agitation - request to renew restraint order.  eICU Interventions  Will renew restraint order.     Intervention Category Minor Interventions: Agitation / anxiety - evaluation and management  Justin [name] 06/12/2015, 7:10 PM

## 2015-06-12 NOTE — Progress Notes (Signed)
[place] Progress Note Patient Name: [name] DOB: 01-Feb-1956 MRN: [id number]   Date of Service  06/12/2015  HPI/Events of Note  Gastric emptying issue - residual feeds post 2 hours = 120 mL which is exactly the rate of enteral nutrition. QTc interval = 0.37 seconds.   eICU Interventions  Will order: 1. Reglan 5 mg IV now and Q 8 hours. 2. Monitor QTc interval Q 6 hours.      Intervention Category Intermediate Interventions: Other:  Lysle Dingwall 06/12/2015, 10:52 PM

## 2015-06-12 NOTE — Progress Notes (Signed)
RT increased PS to 12 due to low volumes and decreased RR with pt. Pt seems more comfortable now. RN notified. Rt will continue to monitor.

## 2015-06-12 NOTE — Progress Notes (Addendum)
Nutrition Follow-up  DOCUMENTATION CODES:   Not applicable  INTERVENTION:    Continue Glucerna 1.2 formula at 60 ml/hr with Prostat liquid protein 30 ml BID via tube  Total TF regimen providing 1928 kcals, 116 gm protein, 1159 ml of free water  NUTRITION DIAGNOSIS:   Inadequate oral intake related to inability to eat as evidenced by NPO status, ongoing  GOAL:   Patient will meet greater than or equal to 90% of their needs, met  MONITOR:   Vent status, TF tolerance, Weight trends, Labs, I & O's  ASSESSMENT:   59 y.o. 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 He was brought to [facility] evening of 12/5 after staff at SNF found him agonally breathing since 3 PM.  Patient from [facility].  Patient is currently intubated on ventilator support MV: 7.7 L/min Temp (24hrs), Avg:98.2 F (36.8 C), Min:97.3 F (36.3 C), Max:98.9 F (37.2 C)   Glucerna 1.2 formula currently infusing at goal rate of 60 ml/hr via PEG tube.  Also receiving Prostat liquid protein 30 ml BID via tube.  CCM note reviewed.  Did SBT 12/8 but could not extubate due to encephalopathy.  Diet Order:  Diet NPO time specified  Skin:  Reviewed, no issues  Last BM:  12/9  Height:   Ht Readings from Last 1 Encounters:  06/12/15 6' (1.829 m)    Weight:   Wt Readings from Last 1 Encounters:  06/12/15 180 lb 8.9 oz (81.9 kg)    Ideal Body Weight:  81 kg  BMI:  Body mass index is 24.48 kg/(m^2).  Estimated Nutritional Needs:   Kcal:  6147  Protein:  115-125 gm  Fluid:  per MD  EDUCATION NEEDS:   No education needs identified at this time  Arthur Holms, RD, LDN Pager #: [phone] After-Hours Pager #: [phone]

## 2015-06-12 NOTE — Progress Notes (Signed)
PULMONARY / CRITICAL CARE MEDICINE   Name: Justin [name] MRN: [id number] DOB: 10/12/1955    ADMISSION DATE:  06/08/2015 CONSULTATION DATE:  06/08/15  REFERRING MD:  EDP  CHIEF COMPLAINT:  Agonal breathing  BRIEF Justin [name] is a 59 y.o. 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  Justin [name] was brought to [facility] evening of 12/5 after staff at SNF found him agonally breathing since 3 PM.  In ED, Justin [name] was completely unresponsive and agonal.  Justin [name] subsequently required intubation for airway protection.  During intubation, a piece of tissue paper was removed from pt's posterior pharynx.  Justin [name] had recent admission 04/05/15 through 04/10/15 for urosepsis.  06/09/15: on fent 172mcg -> did do some commands on left side (old stroke - right side weak). Intermitent oral seizures noticed by RN. On Keppra. Other seizure meds on hold. RNs ays Justin [name] was found agonal with tissue paper in mouth. Making good urine with drown trend increat  06/10/15 - not on pressors. Failed SBT. Neuro thinks is baseline. Neuro recs change imipenem due to seizure. No further seizures noted. RN requesting CVL. GPC in blood   06/11/15: not on pressors. Doing SBT but this is on diprivan gtt. Non-verbal with right weakness neuro says is baseline (at SNF needed physical demo to follow simple commands) . RN says a daughter visited last night. Improving creat   SUBJECTIVE/OVERNIGHT/INTERVAL HX 06/12/15: Diprivan gtt -> gets agitated. Uanble to do sbt because of this  VITAL SIGNS: BP 154/87 mmHg  Pulse 81  Temp(Src) 98.2 F (36.8 C) (Axillary)  Resp 12  Ht 6' (1.829 m)  Wt 81.9 kg (180 lb 8.9 oz)  BMI 24.48 kg/m2  SpO2 100%  HEMODYNAMICS:    VENTILATOR SETTINGS: Vent Mode:  [-] PRVC FiO2 (%):  [40  %] 40 % Set Rate:  [12 bmp] 12 bmp Vt Set:  RW:212346 mL] 620 mL PEEP:  [5 cmH20] 5 cmH20 Plateau Pressure:  [19 cmH20-22 cmH20] 21 cmH20  INTAKE / OUTPUT: I/O last 3 completed shifts: In: 7263.4 [I.V.:3043.4; Other:180; NG/GT:3490; IV [id number] Out: [id number] [Urine:6305; Emesis/NG output:950]  PHYSICAL EXAMINATION: General: Chronically ill appearing male, in NAD. Neuro: Currently Awake and stares but no follow commands. RASS 0 to -1 equivalent on diprivan gtt. Right side weak (mistaknely charted 06/10/15 as left weakness) HEENT: [place]/AT. PERRL. Cardiovascular: RRR, no M/R/G.  Lungs: Respirations even and unlabored.  Coarse bilaterally. Abdomen: PEG in place.  BS x 4, soft, NT/ND.  Musculoskeletal: No gross deformities, no edema.  Skin: Intact, warm, no rashes. Bilateral UE AV fistulas with no bruit or thrill .   LABS: PULMONARY  Recent Labs Lab 06/08/15 2021 06/09/15 0428  PHART 7.466* 7.547*  PCO2ART 36.3 21.3*  PO2ART 381.0* 160*  HCO3 26.2* 18.5*  TCO2 27 19.2  O2SAT 100.0 99.4    CBC  Recent Labs Lab 06/10/15 0044 06/11/15 0407 06/12/15 0442  HGB 13.2 12.8* 12.9*  HCT 43.2 40.8 40.8  WBC 10.0 7.7 7.7  PLT 129* 102* 101*    COAGULATION No results for input(s): INR in the last 168 hours.  CARDIAC    Recent Labs Lab 06/09/15 0030 06/09/15 0640  TROPONINI 0.14* 0.13*   No results for input(s): PROBNP in the last 168 hours.   CHEMISTRY  Recent Labs Lab 06/09/15 0436 06/09/15 0640 06/09/15 1448 06/10/15 0044 06/11/15 0407 06/12/15 0442  NA 150* 151* 156* 149* 147* 147*  K 3.8 3.5 3.1* 3.6 3.3* 4.5  CL 118* 119* 120* 116* 117* 118*  CO2 18* 19* 24 22 23 24   GLUCOSE 620* 497* 84 246* 204* 281*  BUN 66* 64* 59* 62* 47* 34*  CREATININE 2.98* 2.83* 2.51* 2.51* 1.93* 1.83*  CALCIUM 10.2 10.1 10.2 9.5 9.2 9.7  MG 2.5*  --   --  2.2 2.4 2.4  PHOS 1.1*  --   --  2.1* 2.4* 1.8*   Estimated Creatinine Clearance: 46.5 mL/min (by C-G formula based on  Cr of 1.83).   LIVER  Recent Labs Lab 06/08/15 1840  AST 44*  ALT 30  ALKPHOS 91  BILITOT 1.1  PROT 9.0*  ALBUMIN 3.2*     INFECTIOUS  Recent Labs Lab 06/08/15 2134  06/09/15 0436  06/09/15 1140 06/10/15 0044 06/11/15  LATICACIDVEN  --   < > 5.5*  < > 3.8* 2.3* 1.0  PROCALCITON 0.24  --  0.70  --   --  0.83  --   < > = values in this interval not displayed.   ENDOCRINE CBG (last 3)   Recent Labs  06/11/15 2339 06/12/15 0339 06/12/15 0745  GLUCAP 175* 240* 229*         IMAGING x48h  - image(Dg Chest Port 1 View  06/12/2015  CLINICAL DATA:  Endotracheal tube EXAM: PORTABLE CHEST 1 VIEW COMPARISON:  Two days ago FINDINGS: EKG leads create artifact over the chest. Endotracheal tube tip just below the clavicular heads. There is a left IJ central line, tip at the SVC level. Orogastric tube reaches the stomach at least. Worsening aeration with streaky opacities at the bases, left more than right. The left diaphragm is elevated. No definitive effusion. No edema or air leak. IMPRESSION: 1. Unchanged positioning of tubes and central line. 2. Increased basilar atelectasis. Electronically Signed   By: Monte Fantasia M.D.   On: 06/12/2015 08:14   Dg Chest Port 1 View  06/10/2015  CLINICAL DATA:  Post central line placement EXAM: PORTABLE CHEST 1 VIEW COMPARISON:  06/10/2015 FINDINGS: Left central line tip is in the SVC. No pneumothorax. NG tube tip is in the distal esophagus. Endotracheal tube is 3 cm above the carina. Left lower lobe atelectasis or infiltrate, not significantly changed since prior study. Right lung is clear. Low lung volumes. IMPRESSION: Left central line tip in the SVC. No pneumothorax. NG tube tip is in the distal esophagus. Continued left lower lobe atelectasis or infiltrate, not significantly changed. Electronically Signed   By: Rolm Baptise M.D.   On: 06/10/2015 12:52       DISCUSSION: 59 year old male, SNF resident with complicated medical hx,  presented to [facility] ED 12/5 with AMS after several hours of "agonal breathing". Suspect there could possibly be an element of aspiration as Justin [name] has a history of dysphagia and was discharged to SNF with a diet. A tissue was also found in posterior pharynx during intubation. However, I think that this is more likely dehydration based on AKI, hemoconcentration, hypernatremia, and hyperglycemia. 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 Will hydrate somewhat aggressively, treat empirically for infection, and initiate insulin gtt. Will also hold sedating outpatient meds, of which Justin [name] takes several. Trend troponin and repeat EKG due to elevated troponin in ED.   ASSESSMENT / PLAN:  PULMONARY A: Acute hypoxemic respiratory failure secondary to suspected aspiration pneumonitis .  Intubated on 06/08/2015   -  Did sBT 128/16 but could not extubate duet o encephalopathy. Doing sBT 06/12/15 but anticipate inability to extubate   P:   Full vent support CXR in AM Follow ABG Vent bundle  CARDIOVASCULAR A:  Hx of HTN Mild Elevated troponin - likely demand (normal echo oct 2016)   P:  Telemetry Hold home hydralazine, isosorbide, amlodipine Continue home ASA, atorvastatin    RENAL A:   Acute on CKD status post cadaveric renal transplant, not-on HD.    - improved/improving esp with lasix since 06/11/15. Has hypo-phosatemia    P:   Fluids at kov Cotninue free flushes 267mL q6h  LASIX  Since 06/11/15 - increase 06/12/15 Replete K-phos Follow Bmet Continue tacrolimun/stress steroids   GASTROINTESTINAL A:   GERD PEG status  - toelrating PEG feeds but has loose stool due to TF  P:   NPO besides meds via PEG  And TF via peg PPI as per home regimen Carafate per tube OG to gravity Flexi0seal  HEMATOLOGIC A:   H/o B cell lymphoma s/p radiation  P:  Follow CBC - PRBC for hgb < 7gm% Heparin for VTE ppx  INFECTIOUS  A:   CULTURES: Blood 12/5 > Sputum 12/5 > Urine 12/5 > YEAST 100K   Concern for aspiration PNA  vs HCAP Immunosuppressed after renal tx, on tacrolimus /chronic prd  P:   ANTIBIOTICS: Vanc 12/5 >12/8 Aztreonam 12/5 > 12/6 Imipenem>>>12/7, Merrem (change due to seiz) 12/7 >> Diflucan 12/8 (yeast in Urine) >>    ABX as above Follow culture data PCT algorithm Trend WBC and fever curve  ENDOCRINE A:   Profound hyperglycemia with history of DM, 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 Likely multifactorial in setting resp failure, hyperglycemia, sedating meds.  H/o ICH Depression Anxiety Pseudobulbar affect H/o unspecified convusions  - acute enceph continues. S/p eizures 06/09/15 CNS meds on hold  P:   RASS goal: 0 [place] Propofol gtt to precedex gtt on 12/916 Holding home, lamictal, gabapentin, fentanyl patch, tizanidine, nuedexta, celexa Continue home Keppra Apperciate neuro consult DC fent gtt Use fent and versed prn   FAMILY  - Updates: no family available 06/09/15 and 06/20/15 and 06/11/15 . Apparently daughter visited late on 06/10/15. On 06/11/15 - Dr MR called April Salzwedel daughter [place] daughter [phone] - Faith is HCPOA - she denied any questions. I asked her goal -> she said goal is for full recovery and back to SNF. She refused to entertain any other conversation. None at bedside 06/12/15  - Inter-disciplinary family meet or Palliative Care meeting due by:  12/12      The patient is critically ill with multiple organ systems failure and requires high complexity decision making for assessment and support, frequent evaluation and titration of therapies, application of advanced monitoring technologies and extensive interpretation of multiple databases.   Critical Care Time devoted to patient care services described  in this note is  30  Minutes. This time reflects time of care of this signee Dr Brand Males. This critical care time does not reflect procedure time, or teaching time or supervisory time of PA/NP/Med student/Med Resident etc but could involve care discussion time  Dr. Brand Males, M.D., [facility].C.P Pulmonary and Critical Care Medicine Staff Physician [facility] Pager: [phone], If no answer or between  15:00h - 7:00h: call [phone]  06/12/2015 9:20 AM

## 2015-06-12 NOTE — Care Management Important Message (Signed)
Important Message  Patient Details  Name: Justin [name] MRN: [id number] Date of Birth: Jun 13, 1956   Medicare Important Message Given:  Yes    Alicia Seib P Cabell Lazenby 06/12/2015, 1:34 PM

## 2015-06-13 ENCOUNTER — Inpatient Hospital Stay (HOSPITAL_COMMUNITY): Payer: Medicare Other

## 2015-06-13 DIAGNOSIS — Z789 Other specified health status: Secondary | ICD-10-CM

## 2015-06-13 LAB — CBC WITH DIFFERENTIAL/PLATELET
Basophils Absolute: 0 10*3/uL (ref 0.0–0.1)
Basophils Relative: 0 %
Eosinophils Absolute: 0.1 10*3/uL (ref 0.0–0.7)
Eosinophils Relative: 2 %
HEMATOCRIT: 45 % (ref 39.0–52.0)
HEMOGLOBIN: 14.4 g/dL (ref 13.0–17.0)
LYMPHS ABS: 1.3 10*3/uL (ref 0.7–4.0)
LYMPHS PCT: 21 %
MCH: 27.7 pg (ref 26.0–34.0)
MCHC: 32 g/dL (ref 30.0–36.0)
MCV: 86.5 fL (ref 78.0–100.0)
Monocytes Absolute: 0.3 10*3/uL (ref 0.1–1.0)
Monocytes Relative: 5 %
NEUTROS PCT: 72 %
Neutro Abs: 4.6 10*3/uL (ref 1.7–7.7)
Platelets: 135 10*3/uL — ABNORMAL LOW (ref 150–400)
RBC: 5.2 MIL/uL (ref 4.22–5.81)
RDW: 14.7 % (ref 11.5–15.5)
WBC: 6.3 10*3/uL (ref 4.0–10.5)

## 2015-06-13 LAB — PROTIME-INR
INR: 1.12 (ref 0.00–1.49)
Prothrombin Time: 14.6 seconds (ref 11.6–15.2)

## 2015-06-13 LAB — CULTURE, BLOOD (ROUTINE X 2): Culture: NO GROWTH

## 2015-06-13 LAB — BASIC METABOLIC PANEL
Anion gap: 7 (ref 5–15)
BUN: 30 mg/dL — AB (ref 6–20)
CHLORIDE: 116 mmol/L — AB (ref 101–111)
CO2: 24 mmol/L (ref 22–32)
Calcium: 10 mg/dL (ref 8.9–10.3)
Creatinine, Ser: 1.84 mg/dL — ABNORMAL HIGH (ref 0.61–1.24)
GFR calc Af Amer: 44 mL/min — ABNORMAL LOW (ref >60)
GFR calc non Af Amer: 38 mL/min — ABNORMAL LOW (ref >60)
GLUCOSE: 296 mg/dL — AB (ref 65–99)
POTASSIUM: 4.5 mmol/L (ref 3.5–5.1)
Sodium: 147 mmol/L — ABNORMAL HIGH (ref 135–145)

## 2015-06-13 LAB — GLUCOSE, CAPILLARY
GLUCOSE-CAPILLARY: 378 mg/dL — AB (ref 65–99)
GLUCOSE-CAPILLARY: 361 mg/dL — AB (ref 65–99)
Glucose-Capillary: 221 mg/dL — ABNORMAL HIGH (ref 65–99)
Glucose-Capillary: 247 mg/dL — ABNORMAL HIGH (ref 65–99)
Glucose-Capillary: 247 mg/dL — ABNORMAL HIGH (ref 65–99)

## 2015-06-13 LAB — CULTURE, RESPIRATORY W GRAM STAIN

## 2015-06-13 LAB — CULTURE, RESPIRATORY

## 2015-06-13 LAB — PHOSPHORUS: Phosphorus: 3.1 mg/dL (ref 2.5–4.6)

## 2015-06-13 LAB — TROPONIN I: TROPONIN I: 0.09 ng/mL — AB (ref <0.031)

## 2015-06-13 LAB — MAGNESIUM: MAGNESIUM: 2.1 mg/dL (ref 1.7–2.4)

## 2015-06-13 MED ORDER — FUROSEMIDE 40 MG PO TABS
40.0000 mg | ORAL_TABLET | Freq: Every day | ORAL | Status: DC
  Administered 2015-06-14: 40 mg
  Filled 2015-06-13: qty 1

## 2015-06-13 MED ORDER — INSULIN GLARGINE 100 UNIT/ML ~~LOC~~ SOLN
15.0000 [IU] | Freq: Two times a day (BID) | SUBCUTANEOUS | Status: DC
  Administered 2015-06-13 – 2015-06-14 (×3): 15 [IU] via SUBCUTANEOUS
  Filled 2015-06-13 (×3): qty 0.15

## 2015-06-13 MED ORDER — FUROSEMIDE 8 MG/ML PO SOLN
40.0000 mg | Freq: Every day | ORAL | Status: DC
  Filled 2015-06-13: qty 5

## 2015-06-13 NOTE — Progress Notes (Signed)
PULMONARY / CRITICAL CARE MEDICINE   Name: Justin [name] MRN: [id number] DOB: 19-Jul-1955    ADMISSION DATE:  06/08/2015 CONSULTATION DATE:  06/08/15  REFERRING MD:  EDP  CHIEF COMPLAINT:  Agonal breathing  BRIEF Justin [name] is a 59 y.o. 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  He was brought to [facility] evening of 12/5 after staff at SNF found him agonally breathing since 3 PM, presumed aspiration event as paper product found in airway.   SUBJECTIVE/OVERNIGHT/INTERVAL HX Started on reglan overnight for low volume tube feed residual  VITAL SIGNS: BP 107/76 mmHg  Pulse 91  Temp(Src) 97.8 F (36.6 C) (Axillary)  Resp 29  Ht 6' (1.829 m)  Wt 78 kg (171 lb 15.3 oz)  BMI 23.32 kg/m2  SpO2 100%  HEMODYNAMICS:    VENTILATOR SETTINGS: Vent Mode:  [-] PSV;CPAP FiO2 (%):  [40 %] 40 % Set Rate:  [12 bmp] 12 bmp Vt Set:  [620 mL] 620 mL PEEP:  [5 cmH20] 5 cmH20 Pressure Support:  [10 cmH20-12 cmH20] 10 cmH20 Plateau Pressure:  [15 cmH20-24 cmH20] 19 cmH20  INTAKE / OUTPUT: I/O last 3 completed shifts: In: 4497.7 [I.V.:1039.7; Other:30; NG/GT:2570; IV Piggyback:858] Out: 7305 Justin.[name]; Emesis/NG output:450]  PHYSICAL EXAMINATION: General: chronically ill on vent HENT: NCAT ETT in place PULM: CTA B, vent supported breaths CV: RRR, no mgr GI: BS+, soft, nontender MSK: diminished bulk, tone Neuro: intermittently will wake up, doesn't follow commands   LABS: PULMONARY  Recent Labs Lab 06/08/15 2021 06/09/15 0428  PHART 7.466* 7.547*  PCO2ART 36.3 21.3*  PO2ART 381.0* 160*  HCO3 26.2* 18.5*  TCO2 27 19.2  O2SAT 100.0 99.4    CBC  Recent Labs Lab 06/11/15 0407 06/12/15 0442 06/13/15 0400  HGB 12.8* 12.9* 14.4  HCT  40.8 40.8 45.0  WBC 7.7 7.7 6.3  PLT 102* 101* 135*    COAGULATION  Recent Labs Lab 06/13/15 0400  INR 1.12    CARDIAC    Recent Labs Lab 06/09/15 0030 06/09/15 0640 06/13/15 0400  TROPONINI 0.14* 0.13* 0.09*   No results for input(s): PROBNP in the last 168 hours.   CHEMISTRY  Recent Labs Lab 06/09/15 0436  06/09/15 1448 06/10/15 0044 06/11/15 0407 06/12/15 0442 06/13/15 0400  NA 150*  < > 156* 149* 147* 147* 147*  K 3.8  < > 3.1* 3.6 3.3* 4.5 4.5  CL 118*  < > 120* 116* 117* 118* 116*  CO2 18*  < > 24 22 23 24 24   GLUCOSE 620*  < > 84 246* 204* 281* 296*  BUN 66*  < > 59* 62* 47* 34* 30*  CREATININE 2.98*  < > 2.51* 2.51* 1.93* 1.83* 1.84*  CALCIUM 10.2  < > 10.2 9.5 9.2 9.7 10.0  MG 2.5*  --   --  2.2 2.4 2.4 2.1  PHOS 1.1*  --   --  2.1* 2.4* 1.8* 3.1  < > = values in this interval not displayed. Estimated Creatinine Clearance: 46.3 mL/min (by C-G formula based on Cr of 1.84).   LIVER  Recent Labs Lab 06/08/15 1840 06/13/15 0400  AST 44*  --   ALT 30  --   ALKPHOS 91  --   BILITOT 1.1  --  PROT 9.0*  --   ALBUMIN 3.2*  --   INR  --  1.12     INFECTIOUS  Recent Labs Lab 06/08/15 2134  06/09/15 0436  06/09/15 1140 06/10/15 0044 06/11/15  LATICACIDVEN  --   < > 5.5*  < > 3.8* 2.3* 1.0  PROCALCITON 0.24  --  0.70  --   --  0.83  --   < > = values in this interval not displayed.   ENDOCRINE CBG (last 3)   Recent Labs  06/12/15 1925 06/12/15 2356 06/13/15 0736  GLUCAP 269* 247* 221*        12/10 CXR images personally reviewed: ETT in place, lungs clear  DISCUSSION: 59 year old male, SNF resident with complicated medical hx, presented to [facility] ED 12/5 with AMS after several hours of "agonal breathing".  Has persistent encephalopathy.  Has remained on vent since admission, still tachypneic on 12/10 with essentially clear lungs on CXR.  Has HCAP but seems to have cleared up radiographically.   Had a seizure this admission on  imipenem.   ASSESSMENT / PLAN:  PULMONARY A: Acute hypoxemic respiratory failure secondary to suspected aspiration pneumonitis . Intubated on 06/08/2015  Tachypnea on SBT today   P:   Full vent support PSV daily CXR in AM Follow ABG Vent bundle  CARDIOVASCULAR A:  Hx of HTN Mild Elevated troponin - likely demand (normal echo oct 2016) Prolonged QT on 12/6 EKG  P:  Telemetry Hold home hydralazine, isosorbide, amlodipine Continue home ASA, atorvastatin  Hold reglan  RENAL A:   Acute on CKD status post cadaveric renal transplant, not-on HD.  Hypophosphatemia> improved P:   Fluids at kvo Cotninue free flushes 213mL q6h  LASIX  Since 06/11/15 - change to daily dose today with tachycardia Replete K-phos Follow Bmet Continue tacrolimus/stress steroids   GASTROINTESTINAL A:   GERD PEG status Started on reglan 12/9 for ?delayed gastric emptying. Checking residuals has not been shown to be associated with improved outcomes. Concern  P:   D/c reglan NPO besides meds via PEG  And TF via peg PPI as per home regimen Carafate per tube OG to gravity Flexiseal  HEMATOLOGIC A:   H/o B cell lymphoma s/p radiation  P:  Follow CBC - PRBC for hgb < 7gm% Heparin for VTE ppx  INFECTIOUS  A:   CULTURES: Blood 12/5 > coag neg staph Sputum 12/5 > Urine 12/5 > YEAST 100K  Resp 12/6 > abundant GNR  Aspiration PNA vs HCAP > improved Immunosuppressed after renal tx, on tacrolimus /chronic prd  P:   ANTIBIOTICS: Vanc 12/5 >12/8 Aztreonam 12/5 > 12/6 Imipenem>>>12/7, Merrem (change due to seiz) 12/7 >>12/10 Diflucan 12/8 (yeast in Urine) >> 12/10  Stop antibiotics Monitor for fever  ENDOCRINE A:   Profound hyperglycemia with history of DM, 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 Likely multifactorial in setting  resp failure, hyperglycemia, sedating meds.  H/o ICH Depression Anxiety Pseudobulbar affect H/o unspecified convusions Seizure 12/6 on imipenem   P:   RASS goal: 0 Maintain precedex for vent asynchrony Daily WUA Holding home, lamictal, gabapentin, fentanyl patch, tizanidine, nuedexta, celexa Continue home Keppra Use fent prn Versed only if seizure   FAMILY  - Updates: Per Dr. Golden Pop note on 12/10 "no family available 06/09/15 and 06/20/15 and 06/11/15 . Apparently daughter visited  late on 06/10/15. On 06/11/15 - Dr MR called April Fosdick daughter [place] daughter [phone] - Faith is HCPOA - she denied any questions. I asked her goal -> she said goal is for full recovery and back to SNF. She refused to entertain any other conversation." None at bedside 06/13/15  - Inter-disciplinary family meet or Palliative Care meeting due by:  12/12   My cc time 35 minutes  Roselie Awkward, MD [place] [facility] Pager: [phone] Cell: [phone] After 3pm or if no response, call [phone]  06/13/2015 10:45 AM

## 2015-06-13 NOTE — Progress Notes (Signed)
CRITICAL VALUE ALERT  Critical value received:  Tracheal aspirate 12/6 positive for Kleb Pneumo, E.Coli, ESBL  Date of notification:  06/13/2015  Time of notification:  1330  Critical value read back:Yes.    Nurse who received alert:  Achille Rich, RN  MD notified (1st page):  [place]  Time of first page:  [id number]  Responding MD:  [place]  Time MD responded:  [phone]

## 2015-06-14 ENCOUNTER — Inpatient Hospital Stay (HOSPITAL_COMMUNITY): Payer: Medicare Other

## 2015-06-14 DIAGNOSIS — L8990 Pressure ulcer of unspecified site, unspecified stage: Secondary | ICD-10-CM | POA: Diagnosis present

## 2015-06-14 DIAGNOSIS — N183 Chronic kidney disease, stage 3 (moderate): Secondary | ICD-10-CM

## 2015-06-14 DIAGNOSIS — E870 Hyperosmolality and hypernatremia: Secondary | ICD-10-CM

## 2015-06-14 LAB — GLUCOSE, CAPILLARY
GLUCOSE-CAPILLARY: 313 mg/dL — AB (ref 65–99)
GLUCOSE-CAPILLARY: 190 mg/dL — AB (ref 65–99)
GLUCOSE-CAPILLARY: 244 mg/dL — AB (ref 65–99)
GLUCOSE-CAPILLARY: 224 mg/dL — AB (ref 65–99)
GLUCOSE-CAPILLARY: 430 mg/dL — AB (ref 65–99)
Glucose-Capillary: 380 mg/dL — ABNORMAL HIGH (ref 65–99)
Glucose-Capillary: 389 mg/dL — ABNORMAL HIGH (ref 65–99)

## 2015-06-14 LAB — CBC WITH DIFFERENTIAL/PLATELET
BASOS ABS: 0 10*3/uL (ref 0.0–0.1)
Basophils Relative: 0 %
Eosinophils Absolute: 0 10*3/uL (ref 0.0–0.7)
Eosinophils Relative: 1 %
HEMATOCRIT: 48.3 % (ref 39.0–52.0)
HEMOGLOBIN: 14.5 g/dL (ref 13.0–17.0)
LYMPHS ABS: 2.4 10*3/uL (ref 0.7–4.0)
LYMPHS PCT: 40 %
MCH: 26.7 pg (ref 26.0–34.0)
MCHC: 30 g/dL (ref 30.0–36.0)
MCV: 89 fL (ref 78.0–100.0)
Monocytes Absolute: 0.6 10*3/uL (ref 0.1–1.0)
Monocytes Relative: 9 %
NEUTROS ABS: 3.1 10*3/uL (ref 1.7–7.7)
Neutrophils Relative %: 50 %
Platelets: 156 10*3/uL (ref 150–400)
RBC: 5.43 MIL/uL (ref 4.22–5.81)
RDW: 14.7 % (ref 11.5–15.5)
WBC: 6.2 10*3/uL (ref 4.0–10.5)

## 2015-06-14 LAB — BASIC METABOLIC PANEL
ANION GAP: 8 (ref 5–15)
BUN: 43 mg/dL — ABNORMAL HIGH (ref 6–20)
CHLORIDE: 118 mmol/L — AB (ref 101–111)
CO2: 24 mmol/L (ref 22–32)
Calcium: 10.5 mg/dL — ABNORMAL HIGH (ref 8.9–10.3)
Creatinine, Ser: 2.41 mg/dL — ABNORMAL HIGH (ref 0.61–1.24)
GFR calc Af Amer: 32 mL/min — ABNORMAL LOW (ref >60)
GFR, EST NON AFRICAN AMERICAN: 27 mL/min — AB (ref >60)
Glucose, Bld: 263 mg/dL — ABNORMAL HIGH (ref 65–99)
POTASSIUM: 4.3 mmol/L (ref 3.5–5.1)
SODIUM: 150 mmol/L — AB (ref 135–145)

## 2015-06-14 LAB — GLUCOSE, RANDOM: GLUCOSE: 442 mg/dL — AB (ref 65–99)

## 2015-06-14 MED ORDER — INSULIN REGULAR NICU BOLUS VIA INFUSION: 5.0000 [IU] | Freq: Once | INTRAVENOUS | Status: DC

## 2015-06-14 MED ORDER — ACETAMINOPHEN 160 MG/5ML PO SOLN
650.0000 mg | Freq: Four times a day (QID) | ORAL | Status: DC | PRN
  Administered 2015-06-14: 650 mg
  Filled 2015-06-14: qty 20.3

## 2015-06-14 MED ORDER — INSULIN ASPART 100 UNIT/ML ~~LOC~~ SOLN: 0.0000 [IU] | Freq: Three times a day (TID) | SUBCUTANEOUS | Status: DC

## 2015-06-14 MED ORDER — FREE WATER
200.0000 mL | Status: DC
  Administered 2015-06-14 – 2015-06-17 (×16): 200 mL

## 2015-06-14 MED ORDER — SODIUM CHLORIDE 0.9 % IV BOLUS (SEPSIS)
1000.0000 mL | Freq: Once | INTRAVENOUS | Status: AC
  Administered 2015-06-14: 1000 mL via INTRAVENOUS

## 2015-06-14 MED ORDER — SODIUM CHLORIDE 0.9 % IV SOLN
INTRAVENOUS | Status: AC
  Administered 2015-06-14: 15:00:00 via INTRAVENOUS
  Administered 2015-06-15: 75 mL/h via INTRAVENOUS

## 2015-06-14 MED ORDER — INSULIN GLARGINE 100 UNIT/ML ~~LOC~~ SOLN: 10.0000 [IU] | Freq: Every day | SUBCUTANEOUS | Status: DC

## 2015-06-14 MED ORDER — INSULIN ASPART 100 UNIT/ML ~~LOC~~ SOLN
5.0000 [IU] | Freq: Once | SUBCUTANEOUS | Status: AC
  Administered 2015-06-14: 5 [IU] via INTRAVENOUS

## 2015-06-14 NOTE — Progress Notes (Signed)
[place] Progress Note Patient Name: Justin [name] DOB: 18-Feb-1956 MRN: [id number]   Date of Service  06/14/2015  HPI/Events of Note  cbg's over 400 on SSI and tube feeding  eICU Interventions  Regular insulin 5 units iv now Increase ssi to resistant scale  Continue lantus 10 units q pm      Intervention Category Major Interventions: Hyperglycemia - active titration of insulin therapy  Jadrian Murphy 06/14/2015, 9:41 PM

## 2015-06-14 NOTE — NC FL2 (Signed)
[place] LEVEL OF CARE SCREENING TOOL     IDENTIFICATION  Patient Name: Justin [name] Birthdate: 1956/02/01 Sex: male Admission Date (Current Location): 06/08/2015  [facility] and [place] Number: Herbalist and Address:  [facility], [place] [address] [place], [place] [address]      Provider Number: [id number]  Attending Physician Name and Address:  Brand Males, MD  Relative Name and Phone Number:       Current Level of Care: Hospital Recommended Level of Care: [place] Prior Approval Number:    Date Approved/Denied:   PASRR Number:    Discharge Plan: SNF    Current Diagnoses: Patient Active Problem List   Diagnosis Date Noted  . Pressure ulcer 06/14/2015  . Acute respiratory failure with hypoxia ([place]) 06/09/2015  . Hyperglycemia   . Hyperglycemic hyperosmolar nonketotic coma ([place])   . CKD (chronic kidney disease)   . Seizure ([place])   . Encephalopathy acute   . Endotracheally intubated   . Aspiration pneumonia ([place]) 06/08/2015  . Acute kidney injury superimposed on CKD ([place])   . Pneumonia 04/05/2015  . S/p cadaver renal transplant 04/05/2015  . Acute respiratory failure with hypoxia and hypercarbia ([place]) 04/05/2015  . Chronic systolic CHF (congestive heart failure) ([place]) 04/05/2015  . ESRD (end stage renal disease) ([place]) 04/05/2015  . Penile ulcer 04/05/2015  . Sepsis ([place]) 02/26/2015  . Acute renal failure superimposed on stage 3 chronic kidney disease ([place]) 02/26/2015  . GERD (gastroesophageal reflux disease) 02/26/2015  . Depression 02/26/2015  . Gout   . Hyperkalemia 10/19/2014  . AKI (acute kidney injury) ([place]) 10/19/2014  . Elevated troponin 10/19/2014  . Renal transplant, status post 10/19/2014  . Failure to thrive in adult 10/19/2014  . UTI (lower urinary tract infection)   . Secondary diabetes with peripheral vascular disease ([place]) 06/16/2014  . Foley catheter in place 05/29/2013  .  CARDIOMYOPATHY OTHER DISEASES CLASSIFIED ELSW 07/14/2010  . Septicemia ([place]) 05/03/2010  . Diabetes mellitus ([place]) 05/03/2010  . Hyperlipidemia 05/03/2010  . HYPERKALEMIA 05/03/2010  . ANEMIA 05/03/2010  . Essential hypertension 05/03/2010  . Intracranial hemorrhage ([place]) 05/03/2010  . Peripheral vascular disease ([place]) 05/03/2010  . AV FISTULA 05/03/2010  . DVT 05/03/2010  . HEMORRHOIDS 05/03/2010  . HYPOTENSION 05/03/2010  . GI BLEEDING 05/03/2010  . KIDNEY DISEASE 05/03/2010  . CELLULITIS 05/03/2010  . HYPERPARATHYROIDISM, HX OF 05/03/2010  . CARPAL TUNNEL SYNDROME, HX OF 05/03/2010    Orientation RESPIRATION BLADDER Height & Weight    Self  Vent Incontinent, Indwelling catheter   164 lbs.  BEHAVIORAL SYMPTOMS/MOOD NEUROLOGICAL BOWEL NUTRITION STATUS         (NPO)  AMBULATORY STATUS COMMUNICATION OF NEEDS Skin   Total Care Non-Verbally PU Stage and Appropriate Care   PU Stage 2 Dressing: Daily                   Personal Care Assistance Level of Assistance  Total care       Total Care Assistance: Maximum assistance   Functional Limitations Info             SPECIAL CARE FACTORS FREQUENCY                       Contractures      Additional Factors Info  Allergies, Isolation Precautions   Allergies Info: amoxicillin, AMPICILLIN, latex, PENICILLINS, morphine and related, tape  Current Medications (06/14/2015):  This is the current hospital active medication list Current Facility-Administered Medications  Medication Dose Route Frequency Provider Last Rate Last Dose  . 0.9 %  sodium chloride infusion  250 mL Intravenous PRN Rahul P Desai, PA-C      . 0.9 %  sodium chloride infusion   Intravenous Continuous Brand Males, MD 10 mL/hr at 06/14/15 0650    . 0.9 %  sodium chloride infusion   Intravenous Continuous Juanito Doom, MD 75 mL/hr at 06/14/15 1500    . antiseptic oral rinse solution (CORINZ)  7 mL Mouth Rinse 10 times per  day Raylene Miyamoto, MD   7 mL at 06/14/15 1456  . aspirin chewable tablet 81 mg  81 mg Per Tube Daily Raylene Miyamoto, MD   81 mg at 06/14/15 0953  . atorvastatin (LIPITOR) tablet 10 mg  10 mg Per Tube q1800 Rahul P Desai, PA-C   10 mg at 06/13/15 1616  . chlorhexidine gluconate (PERIDEX) 0.12 % solution 15 mL  15 mL Mouth Rinse BID Raylene Miyamoto, MD   15 mL at 06/14/15 0800  . dexmedetomidine (PRECEDEX) 400 MCG/100ML (4 mcg/mL) infusion  0.4-1.2 mcg/kg/hr Intravenous Titrated Romona Curls, RPH 10.2 mL/hr at 06/14/15 1300 0.498 mcg/kg/hr at 06/14/15 1300  . feeding supplement (GLUCERNA 1.2 CAL) liquid 1,000 mL  1,000 mL Per Tube Continuous Rogue Bussing, RD 60 mL/hr at 06/14/15 1500 1,000 mL at 06/14/15 1500  . feeding supplement (PRO-STAT SUGAR FREE 64) liquid 30 mL  30 mL Per Tube BID Rogue Bussing, RD   30 mL at 06/14/15 0953  . fentaNYL (SUBLIMAZE) injection 50-200 mcg  50-200 mcg Intravenous Q2H PRN Brand Males, MD   100 mcg at 06/12/15 0958  . free water 200 mL  200 mL Per Tube Q4H Juanito Doom, MD      . gabapentin (NEURONTIN) 250 MG/5ML solution 100 mg  100 mg Per Tube 3 times per day Brand Males, MD   100 mg at 06/14/15 1455  . Gerhardt's butt cream   Topical BID Brand Males, MD      . heparin injection 5,000 Units  5,000 Units Subcutaneous 3 times per day Rahul Dianna Rossetti, PA-C   5,000 Units at 06/14/15 1455  . insulin aspart (novoLOG) injection 0-9 Units  0-9 Units Subcutaneous 6 times per day Chesley Mires, MD   3 Units at 06/14/15 1204  . insulin glargine (LANTUS) injection 15 Units  15 Units Subcutaneous BID Juanito Doom, MD   15 Units at 06/14/15 [phone]  . ipratropium-albuterol (DUONEB) 0.5-2.5 (3) MG/3ML nebulizer solution 3 mL  3 mL Nebulization QID Courteney Lyn Mackuen, MD   3 mL at 06/14/15 1538  . lamoTRIgine (LAMICTAL) tablet 50 mg  50 mg Per Tube Daily Rahul P Desai, PA-C   50 mg at 06/14/15 0954  . levETIRAcetam (KEPPRA) 100 MG/ML  solution 500 mg  500 mg Per Tube BID Rahul P Desai, PA-C   500 mg at 06/14/15 0955  . midazolam (VERSED) injection 1-2 mg  1-2 mg Intravenous Q2H PRN Raylene Miyamoto, MD   2 mg at 06/09/15 1030  . pantoprazole (PROTONIX) injection 40 mg  40 mg Intravenous QHS Rahul P Desai, PA-C   40 mg at 06/13/15 2113  . sodium chloride 0.9 % injection 10-40 mL  10-40 mL Intracatheter Q12H Brand Males, MD   10 mL at 06/14/15 1001  . sodium chloride 0.9 % injection 10-40 mL  10-40 mL Intracatheter PRN Brand Males, MD      . sucralfate (CARAFATE) 1 GM/10ML suspension 1 g  1 g Per Tube Q8H Rahul P Desai, PA-C   1 g at 06/14/15 1204  . tacrolimus (PROGRAF) capsule 0.5 mg  0.5 mg Oral BID Corey Harold, NP   0.5 mg at 06/14/15 0955  . tacrolimus (PROGRAF) capsule 3 mg  3 mg Oral BID Corey Harold, NP   3 mg at 06/14/15 [id number]     Discharge Medications: Please see discharge summary for a list of discharge medications.  Relevant Imaging Results:  Relevant Lab Results:   Additional Information    Moshe Cipro Berneice Heinrich, LCSW

## 2015-06-14 NOTE — Progress Notes (Signed)
[place] Progress Note Patient Name: Justin [name] DOB: 08/13/1955 MRN: [id number]   Date of Service  06/14/2015  HPI/Events of Note  Low BP.  eICU Interventions  Will give fluid bolus.     Intervention Category Major Interventions: Other:  Rashiya Lofland 06/14/2015, 5:40 AM

## 2015-06-14 NOTE — Clinical Social Work Placement (Signed)
   CLINICAL SOCIAL WORK PLACEMENT  NOTE  Date:  06/14/2015  Patient Details  Name: Justin [name] MRN: [id number] Date of Birth: 09/21/1955  Clinical Social Work is seeking post-discharge placement for this patient at the   level of care (*CSW will initial, date and re-position this form in  chart as items are completed):  Yes   Patient/family provided with [facility] Work Department's list of facilities offering this level of care within the geographic area requested by the patient (or if unable, by the patient's family).  Yes   Patient/family informed of their freedom to choose among providers that offer the needed level of care, that participate in Medicare, Medicaid or managed care program needed by the patient, have an available bed and are willing to accept the patient.  Yes   Patient/family informed of [place] [facility]'s ownership interest in [facility] and [facility], as well as of the fact that they are under no obligation to receive care at these facilities.  PASRR submitted to EDS on       PASRR number received on       Existing PASRR number confirmed on       FL2 transmitted to all facilities in geographic area requested by pt/family on 06/14/15     FL2 transmitted to all facilities within larger geographic area on       Patient informed that his/her managed care company has contracts with or will negotiate with certain facilities, including the following:            Patient/family informed of bed offers received.  Patient chooses bed at       Physician recommends and patient chooses bed at      Patient to be transferred to   on  .  Patient to be transferred to facility by       Patient family notified on   of transfer.  Name of family member notified:        PHYSICIAN       Additional Comment:    _______________________________________________ Matilde Bash, [place] 06/14/2015, 2:47 PM

## 2015-06-14 NOTE — Clinical Social Work Note (Signed)
Clinical Social Work Assessment  Patient Details  Name: Justin [name] MRN: [id number] Date of Birth: 12/13/1955  Date of referral:  06/14/15               Reason for consult:  Facility Placement                Permission sought to share information with:  Facility Sport and exercise psychologist, Family Supports Permission granted to share information::     Name::     Justin [name]  Agency::  [facility]  Relationship::  daughter  Contact Information:  [phone]  Housing/Transportation Living arrangements for the past 2 months:  [place] of Information:  Adult Children Patient Interpreter Needed:  None Criminal Activity/Legal Involvement Pertinent to Current Situation/Hospitalization:  No - Comment as needed Significant Relationships:    Lives with:  Adult Children Do you feel safe going back to the place where you live?  Yes Need for family participation in patient care:  Yes (Comment)  Care giving concerns:  None, at this time.   Social Worker assessment / plan:  Met with Pt's daughter, Justin [name], outside Pt's room.  Pt's daughter stated that Pt has been a resident of [facility] for a significant amount of time.  She stated that he suffered complication from a kidney transplant 6 or so years ago.    Justin [name] stated that she is interested in exploring other SNF options, as it feels that member's basic health needs aren't being met like they should be.  She is just curious to know what else it out there and is fine with keeping at [facility], if need be.  SW provided Justin [name] with a [facility] list.  SW thanked Justin [name] for her time.  Employment status:  Disabled (Comment on whether or not currently receiving Disability) Insurance information:  Managed Medicare PT Recommendations:  Not assessed at this time Information / Referral to community resources:   SNF list  Patient/Family's Response to care:  Pt's daughter is happy with the care that  Pt is receiving at [facility].  Patient/Family's Understanding of and Emotional Response to Diagnosis, Current Treatment, and Prognosis:  Pt's daughter stated that she is unhappy with the care that her father is receiving at [facility] care and that she'd like to explore other area facilities.  She understands, however, that Pt's needs are complex and that not just any facility will accept Pt.  If there are no other options, she is ok with her father staying at [facility].  Emotional Assessment Appearance:  Appears stated age Attitude/Demeanor/Rapport:  Unable to Assess Affect (typically observed):  Unable to Assess Orientation:  Fluctuating Orientation (Suspected and/or reported Sundowners) Alcohol / Substance use:  Never Used Psych involvement (Current and /or in the community):  No (Comment)  Discharge Needs  Concerns to be addressed:  No discharge needs identified Readmission within the last 30 days:  No Current discharge risk:  None Barriers to Discharge:  No Barriers Identified   Matilde Bash, [place] 06/14/2015, 2:24 PM

## 2015-06-14 NOTE — Progress Notes (Signed)
PULMONARY / CRITICAL CARE MEDICINE   Name: TAMMY SANTIS MRN: [id number] DOB: 1955/10/26    ADMISSION DATE:  06/08/2015 CONSULTATION DATE:  06/08/15  REFERRING MD:  EDP  CHIEF COMPLAINT:  Agonal breathing  BRIEF Quantarius Swider is a 59 y.o. 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  He was brought to [facility] evening of 12/5 after staff at SNF found him agonally breathing since 3 PM, presumed aspiration event as paper product found in airway.   SUBJECTIVE/OVERNIGHT/INTERVAL HX Hypotensive overnight Apneic on SBT this morning Fever this morning   VITAL SIGNS: BP 127/80 mmHg  Pulse 99  Temp(Src) 101.8 F (38.8 C) (Axillary)  Resp 17  Ht 6' (1.829 m)  Wt 74.7 kg (164 lb 10.9 oz)  BMI 22.33 kg/m2  SpO2 100%  HEMODYNAMICS:    VENTILATOR SETTINGS: Vent Mode:  [-] PRVC FiO2 (%):  [40 %] 40 % Set Rate:  [12 bmp] 12 bmp Vt Set:  [id number] mL] 620 mL PEEP:  [5 cmH20] 5 cmH20 Plateau Pressure:  [19 cmH20-22 cmH20] 20 cmH20  INTAKE / OUTPUT: I/O last 3 completed shifts: In: 5241.9 [I.V.:1171.9; Other:210; NG/GT:2710; IV Piggyback:1150] Out: [id number] [Urine:4470; Stool:75]  PHYSICAL EXAMINATION: General: chronically ill on vent HENT: NCAT ETT in place PULM: CTA B, vent supported breaths CV: RRR, no mgr GI: BS+, soft, nontender MSK: diminished bulk, tone Neuro: sonorous on my exam today on vent   LABS: PULMONARY  Recent Labs Lab 06/08/15 2021 06/09/15 0428  PHART 7.466* 7.547*  PCO2ART 36.3 21.3*  PO2ART 381.0* 160*  HCO3 26.2* 18.5*  TCO2 27 19.2  O2SAT 100.0 99.4    CBC  Recent Labs Lab 06/12/15 0442 06/13/15 0400 06/14/15 0425  HGB 12.9* 14.4 14.5  HCT 40.8 45.0 48.3  WBC 7.7 6.3 6.2  PLT 101* 135* 156     COAGULATION  Recent Labs Lab 06/13/15 0400  INR 1.12    CARDIAC    Recent Labs Lab 06/09/15 0030 06/09/15 0640 06/13/15 0400  TROPONINI 0.14* 0.13* 0.09*   No results for input(s): PROBNP in the last 168 hours.   CHEMISTRY  Recent Labs Lab 06/09/15 0436  06/10/15 0044 06/11/15 0407 06/12/15 0442 06/13/15 0400 06/14/15 0425  NA 150*  < > 149* 147* 147* 147* 150*  K 3.8  < > 3.6 3.3* 4.5 4.5 4.3  CL 118*  < > 116* 117* 118* 116* 118*  CO2 18*  < > 22 23 24 24 24   GLUCOSE 620*  < > 246* 204* 281* 296* 263*  BUN 66*  < > 62* 47* 34* 30* 43*  CREATININE 2.98*  < > 2.51* 1.93* 1.83* 1.84* 2.41*  CALCIUM 10.2  < > 9.5 9.2 9.7 10.0 10.5*  MG 2.5*  --  2.2 2.4 2.4 2.1  --   PHOS 1.1*  --  2.1* 2.4* 1.8* 3.1  --   < > = values in this interval not displayed. Estimated Creatinine Clearance: 34 mL/min (by C-G formula based on Cr of 2.41).   LIVER  Recent Labs Lab 06/08/15 1840 06/13/15 0400  AST 44*  --   ALT 30  --   ALKPHOS 91  --   BILITOT 1.1  --   PROT 9.0*  --   ALBUMIN 3.2*  --  INR  --  1.12     INFECTIOUS  Recent Labs Lab 06/08/15 2134  06/09/15 0436  06/09/15 1140 06/10/15 0044 06/11/15  LATICACIDVEN  --   < > 5.5*  < > 3.8* 2.3* 1.0  PROCALCITON 0.24  --  0.70  --   --  0.83  --   < > = values in this interval not displayed.   ENDOCRINE CBG (last 3)   Recent Labs  06/14/15 0338 06/14/15 0740 06/14/15 1146  GLUCAP 313* 190* 244*    12/11 CXR images personally reviewed: ETT in place, lungs clear  DISCUSSION: 59 year old male, SNF resident with complicated medical hx, presented to [facility] ED 12/5 with AMS after several hours of "agonal breathing".  Has persistent encephalopathy.  Has remained on vent since admission, still tachypneic on 12/10 with essentially clear lungs on CXR.  Has HCAP but seems to have cleared up radiographically.   Had a seizure this admission on imipenem.   ASSESSMENT / PLAN:  PULMONARY A: Acute  hypoxemic respiratory failure secondary to suspected aspiration pneumonitis> resolving but not awake enough for extubation 06/14/2015    P:   Full vent support Decrease sedation  Repeat SBT SBT daily CXR in AM Follow ABG Vent bundle  CARDIOVASCULAR A:  Hx of HTN Mild Elevated troponin - likely demand (normal echo oct 2016) Prolonged QT on 12/6 EKG Mild hypotension 12/11 from diuresis P:  Telemetry Hold home hydralazine, isosorbide, amlodipine Continue home ASA, atorvastatin  Hold reglan Hold lasix  RENAL A:   Acute on CKD status post cadaveric renal transplant, not-on HD.  Renal function worse 12/11 Hypophosphatemia> improved Hypernatremia P:   Fluids at kvo [place] free flushes 254mL q4h (increase) LASIX  Since 06/11/15 - change to daily dose today with tachycardia Follow Bmet Continue tacrolimus/stress steroids Gentle IVF today  GASTROINTESTINAL A:   GERD PEG status  P:   NPO besides meds via PEG  And TF via peg PPI as per home regimen Carafate per tube OG to gravity Flexiseal  HEMATOLOGIC A:   H/o B cell lymphoma s/p radiation  P:  Follow CBC - PRBC for hgb < 7gm% Heparin for VTE ppx  INFECTIOUS  A:   CULTURES: Blood 12/5 > coag neg staph Sputum 12/5 > Urine 12/5 > YEAST 100K  Resp 12/6 > abundant GNR Blood 12/ 11 >   Aspiration PNA vs HCAP > improved Immunosuppressed after renal tx, on tacrolimus /chronic prd Fever 12/11, CXR clear, no clear source of infection P:   ANTIBIOTICS: Vanc 12/5 >12/8 Aztreonam 12/5 > 12/6 Imipenem>>>12/7, Merrem (change due to seiz) 12/7 >>12/10 Diflucan 12/8 (yeast in Urine) >> 12/10  Monitor off of antibiotics  Blood culture today   ENDOCRINE A:   Profound hyperglycemia with history of DM, 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 Likely  multifactorial in setting resp failure, hyperglycemia, sedating meds.  H/o ICH Depression Anxiety Pseudobulbar affect H/o unspecified convusions Seizure 12/6 on imipenem   P:   RASS goal: 0 Maintain precedex for vent asynchrony > instructed to wean off for SBT today Daily WUA Holding home, lamictal, gabapentin, fentanyl patch, tizanidine, nuedexta, celexa Continue home Keppra Use fent prn Versed only if seizure   FAMILY  - Updates: Per Dr. Golden Pop note on 12/10 "no family available 06/09/15 and 06/20/15 and 06/11/15 . Apparently  daughter visited late on 06/10/15. On 06/11/15 - Dr MR called April Maciolek daughter [place] daughter [phone] - Faith is HCPOA - she denied any questions. I asked her goal -> she said goal is for full recovery and back to SNF. She refused to entertain any other conversation." None at bedside 06/13/15  - Inter-disciplinary family meet or Palliative Care meeting due by:  12/12   My cc time 36 minutes  Roselie Awkward, MD [place] PCCM Pager: [phone] Cell: [phone] After 3pm or if no response, call [phone]  06/14/2015 2:12 PM

## 2015-06-15 DIAGNOSIS — J189 Pneumonia, unspecified organism: Secondary | ICD-10-CM

## 2015-06-15 DIAGNOSIS — N179 Acute kidney failure, unspecified: Secondary | ICD-10-CM

## 2015-06-15 LAB — CBC WITH DIFFERENTIAL/PLATELET
BASOS ABS: 0 10*3/uL (ref 0.0–0.1)
Basophils Relative: 0 %
EOS ABS: 0 10*3/uL (ref 0.0–0.7)
EOS PCT: 0 %
HCT: 43.3 % (ref 39.0–52.0)
Hemoglobin: 12.7 g/dL — ABNORMAL LOW (ref 13.0–17.0)
Lymphocytes Relative: 31 %
Lymphs Abs: 2.4 10*3/uL (ref 0.7–4.0)
MCH: 26.5 pg (ref 26.0–34.0)
MCHC: 29.3 g/dL — ABNORMAL LOW (ref 30.0–36.0)
MCV: 90.2 fL (ref 78.0–100.0)
Monocytes Absolute: 0.6 10*3/uL (ref 0.1–1.0)
Monocytes Relative: 8 %
Neutro Abs: 4.7 10*3/uL (ref 1.7–7.7)
Neutrophils Relative %: 61 %
Platelets: 152 10*3/uL (ref 150–400)
RBC: 4.8 MIL/uL (ref 4.22–5.81)
RDW: 14.7 % (ref 11.5–15.5)
WBC: 7.7 10*3/uL (ref 4.0–10.5)

## 2015-06-15 LAB — GLUCOSE, CAPILLARY
GLUCOSE-CAPILLARY: 105 mg/dL — AB (ref 65–99)
GLUCOSE-CAPILLARY: 119 mg/dL — AB (ref 65–99)
GLUCOSE-CAPILLARY: 159 mg/dL — AB (ref 65–99)
GLUCOSE-CAPILLARY: 166 mg/dL — AB (ref 65–99)
GLUCOSE-CAPILLARY: 172 mg/dL — AB (ref 65–99)
GLUCOSE-CAPILLARY: 197 mg/dL — AB (ref 65–99)
GLUCOSE-CAPILLARY: 238 mg/dL — AB (ref 65–99)
GLUCOSE-CAPILLARY: 408 mg/dL — AB (ref 65–99)
Glucose-Capillary: 376 mg/dL — ABNORMAL HIGH (ref 65–99)
Glucose-Capillary: 312 mg/dL — ABNORMAL HIGH (ref 65–99)
Glucose-Capillary: 269 mg/dL — ABNORMAL HIGH (ref 65–99)
Glucose-Capillary: 182 mg/dL — ABNORMAL HIGH (ref 65–99)
Glucose-Capillary: 150 mg/dL — ABNORMAL HIGH (ref 65–99)
Glucose-Capillary: 83 mg/dL (ref 65–99)
Glucose-Capillary: 179 mg/dL — ABNORMAL HIGH (ref 65–99)
Glucose-Capillary: 138 mg/dL — ABNORMAL HIGH (ref 65–99)
Glucose-Capillary: 181 mg/dL — ABNORMAL HIGH (ref 65–99)

## 2015-06-15 LAB — BASIC METABOLIC PANEL
Anion gap: 7 (ref 5–15)
BUN: 50 mg/dL — AB (ref 6–20)
CALCIUM: 9.7 mg/dL (ref 8.9–10.3)
CO2: 23 mmol/L (ref 22–32)
CREATININE: 2.33 mg/dL — AB (ref 0.61–1.24)
Chloride: 118 mmol/L — ABNORMAL HIGH (ref 101–111)
GFR calc Af Amer: 33 mL/min — ABNORMAL LOW (ref >60)
GFR, EST NON AFRICAN AMERICAN: 29 mL/min — AB (ref >60)
Glucose, Bld: 374 mg/dL — ABNORMAL HIGH (ref 65–99)
POTASSIUM: 4.1 mmol/L (ref 3.5–5.1)
SODIUM: 148 mmol/L — AB (ref 135–145)

## 2015-06-15 MED ORDER — VANCOMYCIN HCL 10 G IV SOLR
1500.0000 mg | INTRAVENOUS | Status: AC
  Administered 2015-06-15: 1500 mg via INTRAVENOUS
  Filled 2015-06-15: qty 1500

## 2015-06-15 MED ORDER — PANTOPRAZOLE SODIUM 40 MG PO PACK
40.0000 mg | PACK | Freq: Every day | ORAL | Status: DC
  Administered 2015-06-15 – 2015-06-22 (×8): 40 mg
  Filled 2015-06-15 (×9): qty 20

## 2015-06-15 MED ORDER — INSULIN ASPART 100 UNIT/ML ~~LOC~~ SOLN: 1.0000 [IU] | SUBCUTANEOUS | Status: DC

## 2015-06-15 MED ORDER — SODIUM CHLORIDE 0.9 % IV SOLN
INTRAVENOUS | Status: DC
  Administered 2015-06-15: 12:00:00 via INTRAVENOUS
  Administered 2015-06-15: 3.5 [IU]/h via INTRAVENOUS
  Filled 2015-06-15 (×3): qty 2.5

## 2015-06-15 MED ORDER — VANCOMYCIN HCL IN DEXTROSE 1-5 GM/200ML-% IV SOLN
1000.0000 mg | INTRAVENOUS | Status: DC
  Administered 2015-06-16 – 2015-06-17 (×2): 1000 mg via INTRAVENOUS
  Filled 2015-06-15 (×2): qty 200

## 2015-06-15 MED ORDER — PREDNISONE 5 MG/5ML PO SOLN
5.0000 mg | Freq: Every day | ORAL | Status: DC
  Administered 2015-06-15 – 2015-06-23 (×9): 5 mg
  Filled 2015-06-15 (×9): qty 5

## 2015-06-15 MED ORDER — CITALOPRAM HYDROBROMIDE 10 MG/5ML PO SOLN
20.0000 mg | Freq: Every day | ORAL | Status: DC
  Administered 2015-06-15 – 2015-06-23 (×9): 20 mg
  Filled 2015-06-15 (×9): qty 10

## 2015-06-15 NOTE — Progress Notes (Signed)
Utilization review completed.  

## 2015-06-15 NOTE — Progress Notes (Signed)
ANTIBIOTIC CONSULT NOTE - INITIAL  Pharmacy Consult for vancomycin Indication: GPC bacteremia  Allergies  Allergen Reactions  . Amoxicillin Other (See Comments)    ON MAR  . Ampicillin Other (See Comments)    ON MAR  . Latex Other (See Comments)    ON MAR  . Morphine And Related Other (See Comments)    UNK  . Penicillins Other (See Comments)    ON MAR; tolerated Ceftriaxone 8/27-9/1  . Tape Other (See Comments)    ON MAR    Patient Measurements: Height: 6' (182.9 cm) Weight: 170 lb 13.7 oz (77.5 kg) IBW/kg (Calculated) : 77.6  Vital Signs: Temp: 98 F (36.7 C) (12/12 0800) Temp Source: Axillary (12/12 0800) BP: 106/56 mmHg (12/12 0900) Pulse Rate: 97 (12/12 0900) Intake/Output from previous day: 12/11 0701 - 12/12 0700 In: 2086.5 [I.V.:1566.5; NG/GT:390] Out: 2030 [Urine:1555; Stool:475] Intake/Output from this shift: Total I/O In: 718 [I.V.:178; NG/GT:540] Out: 120 [Urine:120]  Labs:  Recent Labs  06/13/15 0400 06/14/15 0425 06/15/15 0325  WBC 6.3 6.2 7.7  HGB 14.4 14.5 12.7*  PLT 135* 156 152  CREATININE 1.84* 2.41* 2.33*   Estimated Creatinine Clearance: 36.5 mL/min (by C-G formula based on Cr of 2.33). No results for input(s): VANCOTROUGH, VANCOPEAK, VANCORANDOM, GENTTROUGH, GENTPEAK, GENTRANDOM, TOBRATROUGH, TOBRAPEAK, TOBRARND, AMIKACINPEAK, AMIKACINTROU, AMIKACIN in the last 72 hours.   Microbiology: Recent Results (from the past 720 hour(s))  Culture, blood (routine x 2)     Status: None   Collection Time: 06/08/15  6:40 PM  Result Value Ref Range Status   Specimen Description BLOOD RIGHT HAND  Final   Special Requests BOTTLES DRAWN AEROBIC ONLY 8CC  Final   Culture  Setup Time   Final    GRAM POSITIVE COCCI IN CLUSTERS AEROBIC BOTTLE ONLY CRITICAL RESULT CALLED TO, READ BACK BY AND VERIFIED WITH: [facility] 06/09/15 @ 10 M VESTAL    Culture   Final    STAPHYLOCOCCUS SPECIES (COAGULASE NEGATIVE) THE SIGNIFICANCE OF ISOLATING THIS ORGANISM  FROM A SINGLE SET OF BLOOD CULTURES WHEN MULTIPLE SETS ARE DRAWN IS UNCERTAIN. PLEASE NOTIFY THE MICROBIOLOGY DEPARTMENT WITHIN ONE WEEK IF SPECIATION AND SENSITIVITIES ARE REQUIRED.    Report Status 06/11/2015 FINAL  Final  Culture, blood (routine x 2)     Status: None   Collection Time: 06/08/15  9:18 PM  Result Value Ref Range Status   Specimen Description BLOOD LEFT HAND  Final   Special Requests BOTTLES DRAWN AEROBIC AND ANAEROBIC 5CC  Final   Culture NO GROWTH 5 DAYS  Final   Report Status 06/13/2015 FINAL  Final  MRSA PCR Screening     Status: None   Collection Time: 06/09/15  4:41 AM  Result Value Ref Range Status   MRSA by PCR NEGATIVE NEGATIVE Final    Comment:        The GeneXpert MRSA Assay (FDA approved for NASAL specimens only), is one component of a comprehensive MRSA colonization surveillance program. It is not intended to diagnose MRSA infection nor to guide or monitor treatment for MRSA infections.   Urine culture     Status: None   Collection Time: 06/09/15  7:11 AM  Result Value Ref Range Status   Specimen Description URINE, CATHETERIZED  Final   Special Requests NONE  Final   Culture >=100,000 COLONIES/mL YEAST  Final   Report Status 06/10/2015 FINAL  Final  Culture, respiratory (tracheal aspirate)     Status: None   Collection Time: 06/09/15  4:35 PM  Result Value Ref Range Status   Specimen Description TRACHEAL ASPIRATE  Final   Special Requests NONE  Final   Gram Stain   Final    ABUNDANT WBC PRESENT, PREDOMINANTLY PMN NO SQUAMOUS EPITHELIAL CELLS SEEN ABUNDANT GRAM NEGATIVE RODS FEW GRAM POSITIVE COCCI IN PAIRS FEW YEAST    Culture   Final    ABUNDANT KLEBSIELLA PNEUMONIAE Note: Confirmed Extended Spectrum Beta-Lactamase Producer (ESBL) CRITICAL RESULT CALLED TO, READ BACK BY AND VERIFIED WITH: L VERNON RN 130PM 06/13/15 GUSTK ABUNDANT ESCHERICHIA COLI Note: Confirmed Extended Spectrum Beta-Lactamase Producer (ESBL) Performed at [facility]    Report Status 06/13/2015 FINAL  Final   Organism ID, Bacteria KLEBSIELLA PNEUMONIAE  Final   Organism ID, Bacteria ESCHERICHIA COLI  Final      Susceptibility   Escherichia coli - MIC*    AMPICILLIN >=32 RESISTANT Resistant     AMPICILLIN/SULBACTAM 16 INTERMEDIATE Intermediate     CEFAZOLIN >=64 RESISTANT Resistant     CEFEPIME RESISTANT      CEFTAZIDIME RESISTANT      CEFTRIAXONE >=64 RESISTANT Resistant     CIPROFLOXACIN >=4 RESISTANT Resistant     GENTAMICIN <=1 SENSITIVE Sensitive     IMIPENEM <=0.25 SENSITIVE Sensitive     PIP/TAZO <=4 SENSITIVE Sensitive     TOBRAMYCIN <=1 SENSITIVE Sensitive     TRIMETH/SULFA Value in next row Sensitive      <=20 SENSITIVE(NOTE)    * ABUNDANT ESCHERICHIA COLI   Klebsiella pneumoniae - MIC*    AMPICILLIN Value in next row Resistant      <=20 SENSITIVE(NOTE)    AMPICILLIN/SULBACTAM Value in next row Resistant      <=20 SENSITIVE(NOTE)    CEFAZOLIN Value in next row Resistant      <=20 SENSITIVE(NOTE)    CEFEPIME Value in next row Resistant      <=20 SENSITIVE(NOTE)    CEFTAZIDIME Value in next row Resistant      <=20 SENSITIVE(NOTE)    CEFTRIAXONE Value in next row Resistant      <=20 SENSITIVE(NOTE)    CIPROFLOXACIN Value in next row Sensitive      <=20 SENSITIVE(NOTE)    GENTAMICIN Value in next row Sensitive      <=20 SENSITIVE(NOTE)    IMIPENEM Value in next row Sensitive      <=20 SENSITIVE(NOTE)    PIP/TAZO Value in next row Sensitive      <=20 SENSITIVE(NOTE)    TOBRAMYCIN Value in next row Sensitive      <=20 SENSITIVE(NOTE)    TRIMETH/SULFA Value in next row Resistant      >=320 RESISTANT(NOTE)    * ABUNDANT KLEBSIELLA PNEUMONIAE  Culture, blood (routine x 2)     Status: None (Preliminary result)   Collection Time: 06/14/15  3:25 PM  Result Value Ref Range Status   Specimen Description BLOOD LEFT HAND  Final   Special Requests IN PEDIATRIC BOTTLE 3CC  Final   Culture  Setup Time   Final    GRAM POSITIVE  COCCI IN CLUSTERS AEROBIC BOTTLE ONLY CRITICAL RESULT CALLED TO, READ BACK BY AND VERIFIED WITH: M MILLS 06/15/15 @ 0936 M VESTAL    Culture GRAM POSITIVE COCCI  Final   Report Status PENDING  Incomplete    Medical History: Past Medical History  Diagnosis Date  . Hypertension   . Dyslipidemia   . Carpal tunnel syndrome   . A-V fistula ([place])   . Anemia   . Hemorrhoids   . Hyperparathyroidism   .  Intracranial hemorrhage ([place])   . Hypotension   . Hyperkalemia   . Kidney disease   . Sepsis(995.91)   . GI bleeding   . DVT (deep venous thrombosis) ([place])   . PVD (peripheral vascular disease) ([place])   . DM (diabetes mellitus) ([place])   . Cellulitis   . IV infiltration     Right upper extremity  . PPD positive   . 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 Hypokinesis     EF of 60% with mild anterior hypokinesis, minimal coronary artery disease on catheterization in December of 2007  . Gout   . Renal transplant, status post 10/19/2014  . Pseudobulbar affect   . GERD (gastroesophageal reflux disease)     Assessment: 59 y/o male with multiple medical problems admitted 12/5 with AMS and agonal breathing. He was already treated with antibiotics for HCAP/aspiration PNA this admit. Pharmacy consulted to resume vancomycin for 1 of 2 blood cultures with GPC growing. He is febrile with Tmax 102.4 and WBC are normal. He has a hx of renal transplant and SCr has trneded back up to 2.33, est CrCl ~36 ml/min.  Fluconazole 12/8 >>12/10  Vanc 12/5 > 12/7, 12/12>> Aztreonam 12/5 > 12/6  Primax 12/6 > 12/7  Meropenem 12/7 >12/10   12/5 blood x 2 >> 1/2 CONS  12/6 urine- yeast > 100K  12/6 MRSA neg  12/6 TA >> ecoli esbl (S-gent, imipenem, zosyn, bactrim,tobra), kleb pneumo (S-cipro,gent,imi,zosyn,tobra)  12/11 BCx2>> 1/2 GPC  Goal of Therapy:  Vancomycin trough level 15-20 mcg/ml  Plan:  - Vancomycin 1500 mg IV now then 1000 mg IV q24h -  Monitor renal function, clinical progress, and microdata - Trough at steady-state if continued  [employer], [place].D., BCPS Clinical Pharmacist Pager: [phone] 06/15/2015 10:56 AM

## 2015-06-15 NOTE — Progress Notes (Signed)
[facility] Progress Note Patient Name: JULIA DEC DOB: [birth year] MRN: [id number]   Date of Service  06/15/2015  HPI/Events of Note  suagar 400 despite insluin ssi and lantus   eICU Interventions  Move t o phase 2 icu hyperglycemia protocol     Intervention Category Intermediate Interventions: Hyperglycemia - evaluation and treatment  Aldona Bryner 06/15/2015, 12:38 AM

## 2015-06-15 NOTE — Progress Notes (Signed)
PULMONARY / CRITICAL CARE MEDICINE   Name: Justin [name] MRN: [id number] DOB: 1955-10-09    ADMISSION DATE:  06/08/2015 CONSULTATION DATE:  06/08/15  REFERRING MD:  EDP  CHIEF COMPLAINT:  Agonal breathing  BRIEF Mataeo [name] is a 59 y.o. 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  He was brought to [facility] evening of 12/5 after staff at SNF found him agonally breathing since 3 PM, presumed aspiration event as paper product found in airway.  STUDIES: TTE 10/3:  Mild LVH w/ EF 55-60%. Normal regional wall motion. Grade 1 diastolic dysfunction. RV normal in size & function. EEG 12/6:  Severe generalized slowing on Fentanyl. Pattern consistent w/ hypoxic injury or toxic encephalopathy. No evidence of seizure activity. Port CXR 12/11:  Lordotic view. ETT 4cm above carina. Left IJ w/ tip in SVC. No opacity.  ANTIBIOTICS: Vanc 12/5 - 12/8 Aztreonam 12/5 - 12/6  Imipenem 12/7 Merrem (change due to [place]) 12/7 - 12/10 Diflucan 12/8 (yeast in Urine) - 12/10  MICROBIOLOGY: Blood x2 12/11>>GPC in Clusters Trach Asp 12/6:  E coli (sensitive Gent/Imipenem/Tobra/Zosyn/Bactrim) & Klebsiella pneumoniae (sensitive Cipro/Gent/Imipenem/Zosyn/Tobra) Urine 12/6:  Yeast 100K Blood 12/5:  Coag Negative Staph   LINES/TUBES: OETT 7.5 12/5 by EDP>> L IJ CVL 12/7>> OGT 12/5>> FOLEY 12/6>> PEG>>>  SUBJECTIVE/OVERNIGHT/INTERVAL HX:  Patient transitioned to insulin gtt overnight. More awake this morning. Taken off Precedex gtt. Received Fentanyl IVP.  REVIEW OF SYSTEMS:  Unobtainable given intubation & altered mental status.  VITAL SIGNS: BP 107/60 mmHg  Pulse 93  Temp(Src) 98 F (36.7 C) (Axillary)  Resp 15  Ht 6' (1.829 m)  Wt 170 lb 13.7 oz (77.5 kg)  BMI  23.17 kg/m2  SpO2 100%  HEMODYNAMICS:    VENTILATOR SETTINGS: Vent Mode:  [-] PSV;CPAP FiO2 (%):  [40 %] 40 % Set Rate:  [12 bmp] 12 bmp Vt Set:  [620 mL] 620 mL PEEP:  [5 cmH20] 5 cmH20 Pressure Support:  [10 cmH20-14 cmH20] 10 cmH20 Plateau Pressure:  [11 cmH20-20 cmH20] 19 cmH20  INTAKE / OUTPUT: I/O last 3 completed shifts: In: 4205.6 [I.V.:1935.6; Other:250; NG/GT:1020; IV Piggyback:1000] Out: 2890 [Urine:2340; Stool:550]  PHYSICAL EXAMINATION: General:  Eyes open. No acute distress. No family at bedside.  Integument:  Warm & dry. No rash on exposed skin.  HEENT:  No scleral injection or icterus. Endotracheal tube in place. Cardiovascular:  Regular rate. Mild edema. No appreciable JVD.  Pulmonary:  Coarse breath sounds on auscultation bilaterally. Symmetric chest wall rise on ventilator. Abdomen: Soft. Normal bowel sounds. Nondistended. PEG tube in place. Neurological: Eyes are open. Patient does not track. Patient not following commands. Spontaneously moves left upper & lower extremities.  LABS: PULMONARY  Recent Labs Lab 06/08/15 2021 06/09/15 0428  PHART 7.466* 7.547*  PCO2ART 36.3 21.3*  PO2ART 381.0* 160*  HCO3 26.2* 18.5*  TCO2 27 19.2  O2SAT 100.0 99.4    CBC  Recent Labs Lab 06/13/15 0400 06/14/15 0425 06/15/15 0325  HGB 14.4 14.5 12.7*  HCT 45.0 48.3 43.3  WBC 6.3 6.2 7.7  PLT 135* 156 152    COAGULATION  Recent Labs Lab 06/13/15 0400  INR 1.12    CARDIAC    Recent Labs Lab 06/09/15 0030 06/09/15 0640 06/13/15 0400  TROPONINI 0.14*  0.13* 0.09*   No results for input(s): PROBNP in the last 168 hours.   CHEMISTRY  Recent Labs Lab 06/09/15 0436  06/10/15 0044 06/11/15 0407 06/12/15 0442 06/13/15 0400 06/14/15 0425 06/14/15 2043 06/15/15 0325  NA 150*  < > 149* 147* 147* 147* 150*  --  148*  K 3.8  < > 3.6 3.3* 4.5 4.5 4.3  --  4.1  CL 118*  < > 116* 117* 118* 116* 118*  --  118*  CO2 18*  < > 22 23 24 24 24   --   23  GLUCOSE 620*  < > 246* 204* 281* 296* 263* 442* 374*  BUN 66*  < > 62* 47* 34* 30* 43*  --  50*  CREATININE 2.98*  < > 2.51* 1.93* 1.83* 1.84* 2.41*  --  2.33*  CALCIUM 10.2  < > 9.5 9.2 9.7 10.0 10.5*  --  9.7  MG 2.5*  --  2.2 2.4 2.4 2.1  --   --   --   PHOS 1.1*  --  2.1* 2.4* 1.8* 3.1  --   --   --   < > = values in this interval not displayed. Estimated Creatinine Clearance: 36.5 mL/min (by C-G formula based on Cr of 2.33).   LIVER  Recent Labs Lab 06/08/15 1840 06/13/15 0400  AST 44*  --   ALT 30  --   ALKPHOS 91  --   BILITOT 1.1  --   PROT 9.0*  --   ALBUMIN 3.2*  --   INR  --  1.12     INFECTIOUS  Recent Labs Lab 06/08/15 2134  06/09/15 0436  06/09/15 1140 06/10/15 0044 06/11/15  LATICACIDVEN  --   < > 5.5*  < > 3.8* 2.3* 1.0  PROCALCITON 0.24  --  0.70  --   --  0.83  --   < > = values in this interval not displayed.   ENDOCRINE CBG (last 3)   Recent Labs  06/15/15 0522 06/15/15 0634 06/15/15 0731  GLUCAP 238* 182* 150*    ASSESSMENT / PLAN:  PULMONARY A: Acute Hypoxic Respiratory Failure - Secondary to HCAP/Aspiration Pneumonia.  P:   Full vent support SBT daily Vent bundle Duoneb qid  CARDIOVASCULAR A:  Elevated Troponin I - Likely demand ischemia w/o wall motion on 04/2015 TTE. Prolonged QTc - <571ms. (Sinus w/ PVC & LAFB on 12/6 EKG) H/O HTN  P:  Monitor on Telemetry EKG AM 12/13 Hold home hydralazine, isosorbide, amlodipine Continue home ASA 81mg   Continue Lipitor 10mg  qhs Hold reglan Hold lasix  RENAL A:   Acute on Chronic Renal Failure - Stable UOP with mild improvement. Hypernatremia - Mild. Improving. Hyperchloremia - Mild. Stable. Hypophosphatemia - Resolved. H/O Renal Transplant - Chronic Prograf & Prednisone.  P:   NS @ 75cc/hr Free Water 200cc q4hr via tube Holding Lasix Given Hypernatremia Monitoring UOP Trending Renal Function w/ daily BUN/Creatinine Monitoring electrolytes daily Prograf 3.5mg   bid Restart Prednisone 5mg  VT daily  GASTROINTESTINAL A:   H/O GERD PEG status  P:   NPO besides meds via PEG  And TF via peg Protonix VT daily Carafate per tube OG to gravity Flexiseal  HEMATOLOGIC A:   H/o B cell lymphoma - S/P radiation.  P:  Monitoring cell counts daily w/ CBC Heparin [place] q8hr SCDs  INFECTIOUS  A:   HCAP - Possible Aspiration. E coli & Klebsiella in 12/6 ctx s/p treatment. Chronic Immunosuppression - S/P Renal Transplant. Prograf &  Prednisone. Possible Bacteremia - Fever 12/11 - Repeat Blood Ctx 12/11 w/ GPC in clusters.  P:  Start Vancomycin per pharmacy consult Plan to reculture for any further fever  ENDOCRINE A:   DM - Uncontrolled Blood Glucose. Initiated Insulin gtt 12/12 AM. Chronic Prednisone - Post Renal Transplant. No steroids since 12/6.   P:   Resume Prednisone 5mg  Vt daily Continuing Insulin gtt per protocol  NEUROLOGIC A:   Acute Encephalopathy - Baseline mental statuspoor. Likely multifactorial in setting resp failure, hyperglycemia, sedating meds.  Seizure - 12/6 while on Imipenem H/o ICH H/O Depression/Anxiety H/O Unspecified Convulsions H/O Pseudobulbar Affect  P:   RASS goal: 0 Precedex gtt for vent synchrony Daily Wake Up Assessment Gabapentin 100mg  VT q8hr Lamictal 50mg  VT daily Keppra 500mg  VT bid Start Celexa 20mg  VT daily Fentanyl IV PRN Versed IV PRN for seizure activity Holding home, fentanyl patch, tizanidine, nuedexta   FAMILY  - Updates: Per Dr. Golden Pop note on 12/10 "no family available 06/09/15 and 06/20/15 and 06/11/15 . Apparently daughter visited late on 06/10/15. On 06/11/15 - Dr MR called April Nylen daughter [place] daughter [phone] - Faith is HCPOA - she denied any questions. I asked her goal -> she said goal is for full recovery and back to SNF. She refused to entertain any other conversation." No family at bedside this morning for update.  - Inter-disciplinary  family meet or Palliative Care meeting due by:  12/12   TODAY'S SUMMARY:  59 year old male, SNF resident with complicated medical hx, presented to [facility] ED 12/5 with AMS after several hours of "agonal breathing".  Has persistent encephalopathy.  Has remained on vent since admission. Mental status is main barrier to extubation. Patient's repeat blood cultures with fever on 12/11 suggestive of bacteremia. Restarting vancomycin today. Continuing patient on insulin drip given hyperglycemia. Restarting patient on basal prednisone today as well.  I have spent a total of 47 minutes of critical care time today caring for the patient & reviewing the patient's electronic medical record.  Sonia Baller Ashok Cordia, M.D. [facility] Pulmonary & Critical Care Pager:  [phone] After 3pm or if no response, call [phone] 06/15/2015 8:51 AM

## 2015-06-16 LAB — GLUCOSE, CAPILLARY
GLUCOSE-CAPILLARY: 127 mg/dL — AB (ref 65–99)
GLUCOSE-CAPILLARY: 136 mg/dL — AB (ref 65–99)
GLUCOSE-CAPILLARY: 140 mg/dL — AB (ref 65–99)
GLUCOSE-CAPILLARY: 141 mg/dL — AB (ref 65–99)
GLUCOSE-CAPILLARY: 170 mg/dL — AB (ref 65–99)
GLUCOSE-CAPILLARY: 177 mg/dL — AB (ref 65–99)
GLUCOSE-CAPILLARY: 186 mg/dL — AB (ref 65–99)
GLUCOSE-CAPILLARY: 187 mg/dL — AB (ref 65–99)
GLUCOSE-CAPILLARY: 198 mg/dL — AB (ref 65–99)
Glucose-Capillary: 183 mg/dL — ABNORMAL HIGH (ref 65–99)
Glucose-Capillary: 188 mg/dL — ABNORMAL HIGH (ref 65–99)
Glucose-Capillary: 147 mg/dL — ABNORMAL HIGH (ref 65–99)
Glucose-Capillary: 123 mg/dL — ABNORMAL HIGH (ref 65–99)
Glucose-Capillary: 108 mg/dL — ABNORMAL HIGH (ref 65–99)
Glucose-Capillary: 140 mg/dL — ABNORMAL HIGH (ref 65–99)
Glucose-Capillary: 153 mg/dL — ABNORMAL HIGH (ref 65–99)
Glucose-Capillary: 160 mg/dL — ABNORMAL HIGH (ref 65–99)
Glucose-Capillary: 172 mg/dL — ABNORMAL HIGH (ref 65–99)
Glucose-Capillary: 184 mg/dL — ABNORMAL HIGH (ref 65–99)
Glucose-Capillary: 217 mg/dL — ABNORMAL HIGH (ref 65–99)
Glucose-Capillary: 186 mg/dL — ABNORMAL HIGH (ref 65–99)

## 2015-06-16 LAB — MAGNESIUM: MAGNESIUM: 2.4 mg/dL (ref 1.7–2.4)

## 2015-06-16 LAB — CBC WITH DIFFERENTIAL/PLATELET
BASOS ABS: 0 10*3/uL (ref 0.0–0.1)
Basophils Relative: 0 %
EOS ABS: 0.1 10*3/uL (ref 0.0–0.7)
EOS PCT: 2 %
HCT: 39.4 % (ref 39.0–52.0)
HEMOGLOBIN: 11.6 g/dL — AB (ref 13.0–17.0)
LYMPHS PCT: 22 %
Lymphs Abs: 1.6 10*3/uL (ref 0.7–4.0)
MCH: 26.4 pg (ref 26.0–34.0)
MCHC: 29.4 g/dL — ABNORMAL LOW (ref 30.0–36.0)
MCV: 89.7 fL (ref 78.0–100.0)
Monocytes Absolute: 0.4 10*3/uL (ref 0.1–1.0)
Monocytes Relative: 5 %
NEUTROS PCT: 71 %
Neutro Abs: 5.1 10*3/uL (ref 1.7–7.7)
PLATELETS: 139 10*3/uL — AB (ref 150–400)
RBC: 4.39 MIL/uL (ref 4.22–5.81)
RDW: 14.5 % (ref 11.5–15.5)
WBC: 7.1 10*3/uL (ref 4.0–10.5)

## 2015-06-16 LAB — RENAL FUNCTION PANEL
ANION GAP: 6 (ref 5–15)
Albumin: 2 g/dL — ABNORMAL LOW (ref 3.5–5.0)
BUN: 43 mg/dL — ABNORMAL HIGH (ref 6–20)
CALCIUM: 9.7 mg/dL (ref 8.9–10.3)
CO2: 26 mmol/L (ref 22–32)
CREATININE: 1.69 mg/dL — AB (ref 0.61–1.24)
Chloride: 114 mmol/L — ABNORMAL HIGH (ref 101–111)
GFR, EST AFRICAN AMERICAN: 49 mL/min — AB (ref >60)
GFR, EST NON AFRICAN AMERICAN: 42 mL/min — AB (ref >60)
Glucose, Bld: 145 mg/dL — ABNORMAL HIGH (ref 65–99)
PHOSPHORUS: 2.3 mg/dL — AB (ref 2.5–4.6)
Potassium: 3.8 mmol/L (ref 3.5–5.1)
SODIUM: 146 mmol/L — AB (ref 135–145)

## 2015-06-16 MED ORDER — CETYLPYRIDINIUM CHLORIDE 0.05 % MT LIQD
7.0000 mL | Freq: Two times a day (BID) | OROMUCOSAL | Status: DC
  Administered 2015-06-16 – 2015-06-23 (×14): 7 mL via OROMUCOSAL

## 2015-06-16 MED ORDER — INSULIN NPH (HUMAN) (ISOPHANE) 100 UNIT/ML ~~LOC~~ SUSP
15.0000 [IU] | Freq: Four times a day (QID) | SUBCUTANEOUS | Status: DC
  Administered 2015-06-16 – 2015-06-17 (×3): 15 [IU] via SUBCUTANEOUS
  Filled 2015-06-16: qty 10

## 2015-06-16 MED ORDER — INSULIN NPH (HUMAN) (ISOPHANE) 100 UNIT/ML ~~LOC~~ SUSP
10.0000 [IU] | Freq: Four times a day (QID) | SUBCUTANEOUS | Status: DC
  Administered 2015-06-16: 10 [IU] via SUBCUTANEOUS
  Filled 2015-06-16: qty 10

## 2015-06-16 MED ORDER — INSULIN ASPART 100 UNIT/ML ~~LOC~~ SOLN
0.0000 [IU] | SUBCUTANEOUS | Status: DC
  Administered 2015-06-16: 2 [IU] via SUBCUTANEOUS
  Administered 2015-06-16: 3 [IU] via SUBCUTANEOUS
  Administered 2015-06-16 – 2015-06-17 (×3): 2 [IU] via SUBCUTANEOUS

## 2015-06-16 MED ORDER — IPRATROPIUM-ALBUTEROL 0.5-2.5 (3) MG/3ML IN SOLN: 3.0000 mL | RESPIRATORY_TRACT | Status: DC | PRN

## 2015-06-16 MED ORDER — TACROLIMUS 1 MG/ML ORAL SUSPENSION
3.5000 mg | Freq: Two times a day (BID) | ORAL | Status: DC
  Administered 2015-06-16 – 2015-06-23 (×15): 3.5 mg
  Filled 2015-06-16 (×21): qty 3.5

## 2015-06-16 MED ORDER — BIOGAIA PROBIOTIC PO LIQD
5.0000 mL | Freq: Two times a day (BID) | ORAL | Status: DC
  Administered 2015-06-16 – 2015-06-23 (×15): 5 mL
  Filled 2015-06-16 (×17): qty 5

## 2015-06-16 MED ORDER — BIOGAIA PROBIOTIC PO LIQD
5.0000 mL | Freq: Two times a day (BID) | ORAL | Status: DC
  Filled 2015-06-16 (×2): qty 5

## 2015-06-16 NOTE — Care Management Note (Addendum)
Case Management Note  Patient Details  Name: Justin [name] MRN: [id number] Date of Birth: 28-Dec-1955  Subjective/Objective:   Acute Hypoxic Respiratory Failure - Secondary to HCAP/Aspiration Pneumonia                 Action/Plan: Chart reviewed. CSW following for SNF placement. Dtr, April #[phone]  interested in exploring different SNF options. Pt was at [facility]. No additional NCM needs identified.   Expected Discharge Date:                  Expected Discharge Plan:  Skilled Nursing Facility  In-House Referral:  Clinical Social Work  Discharge planning Services  CM Consult  Post Acute Care Choice:  NA Choice offered to:     DME Arranged:  N/A DME Agency:     HH Arranged:  NA HH Agency:  NA  Status of Service:  Completed, signed off  Medicare Important Message Given:  Yes Date Medicare IM Given:    Medicare IM give by:    Date Additional Medicare IM Given:    Additional Medicare Important Message give by:     If discussed at [place] of Stay Meetings, dates discussed:    Additional Comments:  Erenest Rasher, RN 06/16/2015, 2:36 PM

## 2015-06-16 NOTE — Care Management Important Message (Signed)
Important Message  Patient Details  Name: Justin [name] MRN: [id number] Date of Birth: February 13, 1956   Medicare Important Message Given:  Yes    Erenest Rasher, RN 06/16/2015, 11:46 AM

## 2015-06-16 NOTE — Procedures (Signed)
Extubation Procedure Note  Patient Details:   Name: Justin [name] DOB: 02-15-1956 MRN: [id number]   Airway Documentation:  Airway 7.5 mm (Active)  Secured at (cm) 24 cm 06/16/2015  7:47 AM  Measured From Lips 06/16/2015  7:47 AM  [place] 06/16/2015  7:47 AM  Secured By [employer] 06/16/2015  7:47 AM  Tube Holder Repositioned Yes 06/16/2015  7:47 AM  Cuff Pressure (cm H2O) 25 cm H2O 06/15/2015  7:23 PM  Site Condition Dry 06/15/2015  4:57 PM    Evaluation  O2 sats: stable throughout Complications: No apparent complications Patient did tolerate procedure well. Bilateral Breath Sounds: Clear, Diminished Suctioning: Oral, Airway Yes   Positive cuff leak, extubated to 2L [place], RN at bedside.  [place] R Brissa Asante 06/16/2015, 10:00 AM

## 2015-06-16 NOTE — Progress Notes (Signed)
PULMONARY / CRITICAL CARE MEDICINE   Name: Justin [name] MRN: [id number] DOB: [birth year]    ADMISSION DATE:  06/08/2015 CONSULTATION DATE:  06/08/15  REFERRING MD:  EDP  CHIEF COMPLAINT:  Agonal breathing  BRIEF Justin [name] is a 59 y.o. 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  He was brought to [facility] evening of 12/5 after staff at SNF found him agonally breathing since 3 PM, presumed aspiration event as paper product found in airway.  STUDIES: TTE 10/3:  Mild LVH w/ EF 55-60%. Normal regional wall motion. Grade 1 diastolic dysfunction. RV normal in size & function. EEG 12/6:  Severe generalized slowing on Fentanyl. Pattern consistent w/ hypoxic injury or toxic encephalopathy. No evidence of seizure activity. Port CXR 12/11:  Lordotic view. ETT 4cm above carina. Left IJ w/ tip in SVC. No opacity. EKG 12/13:  QTc 469ms. Normal sinus rhythm.  ANTIBIOTICS: Vanc 12/5 - 12/8; 12/12>>>  Aztreonam 12/5 - 12/6  Imipenem 12/7 Merrem (change due to [place]) 12/7 - 12/10 Diflucan 12/8 (yeast in Urine) - 12/10  MICROBIOLOGY: Blood x2 12/11>>GPC in Clusters Trach Asp 12/6:  E coli (sensitive Gent/Imipenem/Tobra/Zosyn/Bactrim) & Klebsiella pneumoniae (sensitive Cipro/Gent/Imipenem/Zosyn/Tobra) Urine 12/6:  Yeast 100K Blood 12/5:  Coag Negative Staph   LINES/TUBES: OETT 7.5 12/5 by EDP>> L IJ CVL 12/7>> OGT 12/5>> FOLEY 12/6>> PEG>>>  SUBJECTIVE/OVERNIGHT/INTERVAL HX:  No acute events overnight. Patient is more awake this morning but still not following commands. Blood glucose better controlled on insulin drip.  REVIEW OF SYSTEMS:  Unobtainable given intubation & altered mental status.  VITAL SIGNS: BP 137/74 mmHg  Pulse 79  Temp(Src) 96.8 F  (36 C) (Axillary)  Resp 12  Ht 6' (1.829 m)  Wt 176 lb 2.4 oz (79.9 kg)  BMI 23.88 kg/m2  SpO2 100%  HEMODYNAMICS:    VENTILATOR SETTINGS: Vent Mode:  [-] PRVC FiO2 (%):  [40 %] 40 % Set Rate:  [12 bmp] 12 bmp Vt Set:  RW:212346 mL] 620 mL PEEP:  [5 cmH20] 5 cmH20 Pressure Support:  [10 cmH20] 10 cmH20 Plateau Pressure:  [16 cmH20-19 cmH20] 18 cmH20  INTAKE / OUTPUT: I/O last 3 completed shifts: In: 5676.6 [I.V.:1926.6; Other:300; NG/GT:3450] Out: 2983 [Urine:2208; Stool:775]  PHYSICAL EXAMINATION: General:  Eyes open. TV on. No distress. No family at bedside.  Integument:  Warm & dry. No rash on exposed skin.  HEENT:  No scleral injection. Endotracheal tube in place. Cardiovascular:  Regular rate. Mild edema unchanged. No appreciable JVD.  Pulmonary:  Good aeration bilaterally on auscultation. Normal work of breathing on PS 10/5 FiO2 0.4. Symmetric chest wall rise on ventilator. Abdomen: Soft. Normal bowel sounds. Nondistended. PEG tube in place. Neurological: Eyes are open. Patient does track. Patient not following commands. Spontaneously moves left upper & lower extremities.  LABS: PULMONARY No results for input(s): PHART, PCO2ART, PO2ART, HCO3, TCO2, O2SAT in the last 168 hours.  Invalid input(s): PCO2, PO2  CBC  Recent Labs Lab 06/14/15 0425 06/15/15 0325 06/16/15 0500  HGB 14.5 12.7* 11.6*  HCT 48.3 43.3 39.4  WBC 6.2 7.7 7.1  PLT 156 152 139*    COAGULATION  Recent Labs Lab 06/13/15 0400  INR 1.12    CARDIAC    Recent Labs Lab 06/13/15 0400  TROPONINI  0.09*   No results for input(s): PROBNP in the last 168 hours.   CHEMISTRY  Recent Labs Lab 06/10/15 0044 06/11/15 0407 06/12/15 0442 06/13/15 0400 06/14/15 0425 06/14/15 2043 06/15/15 0325 06/16/15 0500  NA 149* 147* 147* 147* 150*  --  148* 146*  K 3.6 3.3* 4.5 4.5 4.3  --  4.1 3.8  CL 116* 117* 118* 116* 118*  --  118* 114*  CO2 22 23 24 24 24   --  23 26  GLUCOSE 246* 204* 281*  296* 263* 442* 374* 145*  BUN 62* 47* 34* 30* 43*  --  50* 43*  CREATININE 2.51* 1.93* 1.83* 1.84* 2.41*  --  2.33* 1.69*  CALCIUM 9.5 9.2 9.7 10.0 10.5*  --  9.7 9.7  MG 2.2 2.4 2.4 2.1  --   --   --  2.4  PHOS 2.1* 2.4* 1.8* 3.1  --   --   --  2.3*   Estimated Creatinine Clearance: 50.4 mL/min (by C-G formula based on Cr of 1.69).   LIVER  Recent Labs Lab 06/13/15 0400 06/16/15 0500  ALBUMIN  --  2.0*  INR 1.12  --      INFECTIOUS  Recent Labs Lab 06/09/15 1140 06/10/15 0044 06/11/15  LATICACIDVEN 3.8* 2.3* 1.0  PROCALCITON  --  0.83  --      ENDOCRINE CBG (last 3)   Recent Labs  06/15/15 2208 06/15/15 2301 06/16/15 0005  GLUCAP 147* 140* 141*    ASSESSMENT / PLAN:  PULMONARY A: Acute Hypoxic Respiratory Failure - Secondary to HCAP/Aspiration Pneumonia.  P:   Full vent support SBT this morning on PS 0/5 FiO2 0.3 with possible extubation Vent bundle Duoneb qid  CARDIOVASCULAR A:  Elevated Troponin I - Likely demand ischemia w/o wall motion on 04/2015 TTE. Prolonged QTc - 430ms on EKG today. (Sinus w/ PVC & LAFB on 12/6 EKG) H/O HTN  P:  Monitor on Telemetry EKG AM 12/13 ASA 81mg   Lipitor 10mg  qhs Hold Lasix Hold home hydralazine, isosorbide, amlodipine  RENAL A:   Acute on Chronic Renal Failure - Stable UOP. Improving. Hypernatremia - Mild. Improving. Hyperchloremia - Mild. Improving. Hypophosphatemia - Mild. H/O Renal Transplant - Chronic Prograf & Prednisone.  P:   Free Water 200cc q4hr via tube Holding Lasix Given Hypernatremia Monitoring UOP Trending Renal Function w/ daily BUN/Creatinine Monitoring electrolytes daily Prograf 3.5mg  bid Prednisone 5mg  VT daily Prograf level this morning before AM dose Hold Reglan  GASTROINTESTINAL A:   H/O GERD PEG status  P:   NPO  Meds via PEG  & TF via peg Carafate q8hr VT Protonix VT daily Probiotic 52mL bid OG to clamp Flexiseal  HEMATOLOGIC A:   H/o B cell lymphoma - S/P  radiation. Anemia - Mild. No signs of active bleeding Thrombocytopenia - Mild.  P:  Monitoring cell counts daily w/ CBC Heparin [place] q8hr SCDs  INFECTIOUS  A:   Possible Bacteremia - Fever 12/11 - Repeat Blood Ctx 12/11 w/ GPC in clusters. HCAP - Possible Aspiration. E coli & Klebsiella in 12/6 ctx s/p treatment. Chronic Immunosuppression - S/P Renal Transplant. Prograf & Prednisone.  P:  Vancomycin per pharmacy consult Day #2 Plan to reculture for any further fever Awaiting finalization of blood cultures  ENDOCRINE A:   DM - Controlled Blood Glucose. Initiated Insulin gtt 12/12 AM. Chronic Prednisone - Post Renal Transplant. Steroids resumed 12/12.   P:   Prednisone 5mg  VT daily Tapering off Insulin gtt  NPH 10u [place]  q6hr Accu-Checks transitioning to q4hr off the drip Low Dose SSI per algorithm  NEUROLOGIC A:   Acute Encephalopathy - Baseline mental status poor. Likely multifactorial in setting resp failure, hyperglycemia, sedating meds.  Seizure - 12/6 while on Imipenem H/o ICH H/O Depression/Anxiety H/O Unspecified Convulsions H/O Pseudobulbar Affect  P:   RASS goal: 0 Daily Wake Up Assessment Gabapentin 100mg  VT q8hr Lamictal 50mg  VT daily Keppra 500mg  VT bid Celexa 20mg  VT daily Fentanyl IV PRN Versed IV PRN for seizure activity Holding home fentanyl patch, tizanidine, nuedexta   FAMILY  - Updates: Per Dr. Golden Pop note on 12/10 "no family available 06/09/15 and 06/20/15 and 06/11/15 . Apparently daughter visited late on 06/10/15. On 06/11/15 - Dr MR called April Atayde daughter [place] daughter [phone] - Faith is HCPOA - she denied any questions. I asked her goal -> she said goal is for full recovery and back to SNF. She refused to entertain any other conversation." No family at bedside this morning for update 12/13.  TODAY'S SUMMARY:  59 year old male, SNF resident with complicated medical hx, presented to [facility] ED 12/5 with AMS  after several hours of "agonal breathing".  Patient is more awake this morning. He attempts to be interactive but I suspect there is an element of delirium from previous sedating medications. Transitioning patient from insulin drip to subcutaneous insulin with close monitoring. Attempting spontaneous breathing trial this morning with possible extubation.  I have spent a total of 42 minutes of critical care time today caring for the patient, discussing the plan of care with nurse at bedside, & reviewing the patient's electronic medical record.  Sonia Baller Ashok Cordia, M.D. [facility] Pulmonary & Critical Care Pager:  [phone] After 3pm or if no response, call [phone] 06/16/2015 9:01 AM

## 2015-06-17 DIAGNOSIS — I10 Essential (primary) hypertension: Secondary | ICD-10-CM

## 2015-06-17 LAB — RENAL FUNCTION PANEL
ANION GAP: 4 — AB (ref 5–15)
Albumin: 2 g/dL — ABNORMAL LOW (ref 3.5–5.0)
BUN: 35 mg/dL — ABNORMAL HIGH (ref 6–20)
CHLORIDE: 111 mmol/L (ref 101–111)
CO2: 28 mmol/L (ref 22–32)
Calcium: 9.9 mg/dL (ref 8.9–10.3)
Creatinine, Ser: 1.44 mg/dL — ABNORMAL HIGH (ref 0.61–1.24)
GFR, EST AFRICAN AMERICAN: 59 mL/min — AB (ref >60)
GFR, EST NON AFRICAN AMERICAN: 51 mL/min — AB (ref >60)
Glucose, Bld: 221 mg/dL — ABNORMAL HIGH (ref 65–99)
POTASSIUM: 4.3 mmol/L (ref 3.5–5.1)
Phosphorus: 2.3 mg/dL — ABNORMAL LOW (ref 2.5–4.6)
Sodium: 143 mmol/L (ref 135–145)

## 2015-06-17 LAB — GLUCOSE, CAPILLARY
GLUCOSE-CAPILLARY: 197 mg/dL — AB (ref 65–99)
GLUCOSE-CAPILLARY: 151 mg/dL — AB (ref 65–99)
GLUCOSE-CAPILLARY: 135 mg/dL — AB (ref 65–99)
Glucose-Capillary: 127 mg/dL — ABNORMAL HIGH (ref 65–99)
Glucose-Capillary: 118 mg/dL — ABNORMAL HIGH (ref 65–99)

## 2015-06-17 LAB — CBC WITH DIFFERENTIAL/PLATELET
Basophils Absolute: 0 10*3/uL (ref 0.0–0.1)
Basophils Relative: 0 %
EOS ABS: 0.1 10*3/uL (ref 0.0–0.7)
Eosinophils Relative: 1 %
HCT: 41.4 % (ref 39.0–52.0)
HEMOGLOBIN: 12 g/dL — AB (ref 13.0–17.0)
LYMPHS ABS: 1.4 10*3/uL (ref 0.7–4.0)
Lymphocytes Relative: 19 %
MCH: 26.1 pg (ref 26.0–34.0)
MCHC: 29 g/dL — AB (ref 30.0–36.0)
MCV: 90.2 fL (ref 78.0–100.0)
MONOS PCT: 4 %
Monocytes Absolute: 0.3 10*3/uL (ref 0.1–1.0)
NEUTROS PCT: 76 %
Neutro Abs: 5.6 10*3/uL (ref 1.7–7.7)
Platelets: 157 10*3/uL (ref 150–400)
RBC: 4.59 MIL/uL (ref 4.22–5.81)
RDW: 14.3 % (ref 11.5–15.5)
WBC: 7.4 10*3/uL (ref 4.0–10.5)

## 2015-06-17 LAB — MAGNESIUM: Magnesium: 2.3 mg/dL (ref 1.7–2.4)

## 2015-06-17 MED ORDER — INSULIN NPH (HUMAN) (ISOPHANE) 100 UNIT/ML ~~LOC~~ SUSP
15.0000 [IU] | Freq: Four times a day (QID) | SUBCUTANEOUS | Status: DC
  Administered 2015-06-17 – 2015-06-23 (×23): 15 [IU] via SUBCUTANEOUS

## 2015-06-17 MED ORDER — INSULIN NPH (HUMAN) (ISOPHANE) 100 UNIT/ML ~~LOC~~ SUSP: 20.0000 [IU] | Freq: Four times a day (QID) | SUBCUTANEOUS | Status: DC

## 2015-06-17 MED ORDER — FREE WATER
200.0000 mL | Freq: Four times a day (QID) | Status: DC
  Administered 2015-06-17 – 2015-06-23 (×25): 200 mL

## 2015-06-17 MED ORDER — HYDRALAZINE HCL 25 MG PO TABS
25.0000 mg | ORAL_TABLET | Freq: Two times a day (BID) | ORAL | Status: DC
  Administered 2015-06-17 – 2015-06-23 (×12): 25 mg
  Filled 2015-06-17 (×14): qty 1

## 2015-06-17 MED ORDER — VANCOMYCIN HCL IN DEXTROSE 750-5 MG/150ML-% IV SOLN
750.0000 mg | Freq: Two times a day (BID) | INTRAVENOUS | Status: DC
  Administered 2015-06-17 – 2015-06-20 (×6): 750 mg via INTRAVENOUS
  Filled 2015-06-17 (×7): qty 150

## 2015-06-17 MED ORDER — INSULIN ASPART 100 UNIT/ML ~~LOC~~ SOLN
0.0000 [IU] | SUBCUTANEOUS | Status: DC
  Administered 2015-06-17 (×2): 2 [IU] via SUBCUTANEOUS
  Administered 2015-06-18 (×5): 3 [IU] via SUBCUTANEOUS
  Administered 2015-06-19: 0 [IU] via SUBCUTANEOUS
  Administered 2015-06-19: 2 [IU] via SUBCUTANEOUS
  Administered 2015-06-19 (×2): 3 [IU] via SUBCUTANEOUS
  Administered 2015-06-20: 0 [IU] via SUBCUTANEOUS
  Administered 2015-06-20: 2 [IU] via SUBCUTANEOUS
  Administered 2015-06-20 (×2): 0 [IU] via SUBCUTANEOUS
  Administered 2015-06-20: 3 [IU] via SUBCUTANEOUS
  Administered 2015-06-20: 2 [IU] via SUBCUTANEOUS
  Administered 2015-06-21 (×2): 0 [IU] via SUBCUTANEOUS
  Administered 2015-06-21 (×2): 3 [IU] via SUBCUTANEOUS
  Administered 2015-06-22: 2 [IU] via SUBCUTANEOUS
  Administered 2015-06-22 (×3): 3 [IU] via SUBCUTANEOUS
  Administered 2015-06-23: 2 [IU] via SUBCUTANEOUS
  Administered 2015-06-23: 3 [IU] via SUBCUTANEOUS

## 2015-06-17 NOTE — Progress Notes (Signed)
Pharmacy Antibiotic Follow-up Note  Justin [name] is a 60 y.o. year-old male admitted on 06/08/2015.  The patient is currently on day 3 of Vancomycin for Coag neg staph bacteremia.  Assessment/Plan: The dose of Vancomycin will be adjusted to 750mg  IV q12h based on improved renal function.  Will check Vancomycin trough at steady state.  Temp (24hrs), Avg:98.1 F (36.7 C), Min:97.6 F (36.4 C), Max:98.4 F (36.9 C)   Recent Labs Lab 06/13/15 0400 06/14/15 0425 06/15/15 0325 06/16/15 0500 06/17/15 0555  WBC 6.3 6.2 7.7 7.1 7.4    Recent Labs Lab 06/13/15 0400 06/14/15 0425 06/15/15 0325 06/16/15 0500 06/17/15 0555  CREATININE 1.84* 2.41* 2.33* 1.69* 1.44*   Estimated Creatinine Clearance: 59.1 mL/min (by C-G formula based on Cr of 1.44).    Allergies  Allergen Reactions  . Amoxicillin Other (See Comments)    ON MAR  . Ampicillin Other (See Comments)    ON MAR  . Latex Other (See Comments)    ON MAR  . Morphine And Related Other (See Comments)    UNK  . Penicillins Other (See Comments)    ON MAR; tolerated Ceftriaxone 8/27-9/1  . Tape Other (See Comments)    ON MAR    Antimicrobials this admission: Fluconazole 12/8 >>12/10 Vanc 12/5 > 12/7,  Restart 12/12>> Aztreonam 12/5 > 12/6 Primax 12/6 > 12/7 Meropenem 12/7 >12/10  Levels/dose changes this admission:   Microbiology results: 12/5 blood x 2 >> 1/2 CONS 12/6 urine- yeast > 100K 12/6 MRSA neg 12/6 TA >> ecoli esbl (S-gent, imipenem, zosyn, bactrim,tobra), kleb pneumo (S-cipro,gent,imi,zosyn,tobra) 12/11 BCx2>> 2/2 CoNS   Thank you for allowing pharmacy to be a part of this patient's care.  [employer], Pharm.D., BCPS Clinical Pharmacist Pager [phone] 06/17/2015 3:48 PM

## 2015-06-17 NOTE — Progress Notes (Signed)
PULMONARY / CRITICAL CARE MEDICINE   Name: Justin [name] MRN: [id number] DOB: 17-Dec-1955    ADMISSION DATE:  06/08/2015 CONSULTATION DATE:  06/08/15  REFERRING MD:  EDP  CHIEF COMPLAINT:  Agonal breathing  BRIEF Justin [name] is a 59 y.o. 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  He was brought to [facility] evening of 12/5 after staff at SNF found him agonally breathing since 3 PM, presumed aspiration event as paper product found in airway.  STUDIES: TTE 10/3:  Mild LVH w/ EF 55-60%. Normal regional wall motion. Grade 1 diastolic dysfunction. RV normal in size & function. CT HEAD 12/5: No acute intracranial pathology. Chronic encephalomalacia of left cerebral hemisphere. Moderate cortical volume loss with small vessel ischemic microangiopathy. EEG 12/6:  Severe generalized slowing on Fentanyl. Pattern consistent w/ hypoxic injury or toxic encephalopathy. No evidence of seizure activity. Port CXR 12/11:  Lordotic view. ETT 4cm above carina. Left IJ w/ tip in SVC. No opacity. EKG 12/13:  QTc 4104ms. Normal sinus rhythm.  ANTIBIOTICS: Vanc 12/5 - 12/8; 12/12>>>  Aztreonam 12/5 - 12/6  Imipenem 12/7 Merrem (change due to [place]) 12/7 - 12/10 Diflucan 12/8 (yeast in Urine) - 12/09  MICROBIOLOGY: Blood x2 12/11>>GPC in Clusters 2/2 bottles Trach Asp 12/6:  E coli (sensitive Gent/Imipenem/Tobra/Zosyn/Bactrim) & Klebsiella pneumoniae (sensitive Cipro/Gent/Imipenem/Zosyn/Tobra) Urine 12/6:  Yeast 100K Blood 12/5:  Coag Negative Staph   LINES/TUBES: L IJ CVL 12/7>>> FOLEY 12/6>>> PEG>>> OGT 12/5 - 12/13 OETT 7.5 12/5 - 12/13  SUBJECTIVE/OVERNIGHT/INTERVAL HX:  Extubated to nasal cannula & insulin drip discontinued yesterday (12/13). Unable to  understand the patient and still with some altered mental status.  REVIEW OF SYSTEMS:  Unable to obtain given patient still with some altered mentation but more awake today.  VITAL SIGNS: BP 146/74 mmHg  Pulse 80  Temp(Src) 98.1 F (36.7 C) (Oral)  Resp 21  Ht 6' (1.829 m)  Wt 179 lb 3.7 oz (81.3 kg)  BMI 24.30 kg/m2  SpO2 100%  HEMODYNAMICS:    VENTILATOR SETTINGS:    INTAKE / OUTPUT: I/O last 3 completed shifts: In: 4545.1 [P.O.:60; I.V.:295.1; Other:440; NG/GT:3550; IV Piggyback:200] Out: 2345 [Urine:1970; Stool:375]  PHYSICAL EXAMINATION: General:  Eyes open. Awake. No family at bedside.  Integument:  Warm & dry. No rash on exposed skin. Skin excoriation around penis. HEENT:  No scleral injection. Moist mucus membranes. Cardiovascular:  Regular rate. Normal S1 & S2. No edema in lower extremities. Pulmonary:  Clear to auscultation bilaterally. Normal work of breathing on room air. Abdomen: Soft. Normal bowel sounds. Nondistended. PEG present. Neurological: Eyes are open. Patient tracks to voice. Does not follow commands. Spontaneously moving left upper & lower extremities.  LABS: PULMONARY No results for input(s): PHART, PCO2ART, PO2ART, HCO3, TCO2, O2SAT in the last 168 hours.  Invalid input(s): PCO2, PO2  CBC  Recent Labs Lab 06/15/15 0325 06/16/15 0500 06/17/15 0555  HGB 12.7* 11.6* 12.0*  HCT 43.3 39.4 41.4  WBC 7.7 7.1 7.4  PLT 152 139* 157    COAGULATION  Recent Labs Lab 06/13/15 0400  INR 1.12    CARDIAC    Recent Labs Lab 06/13/15 0400  TROPONINI 0.09*   No results for input(s): PROBNP in the last 168 hours.   CHEMISTRY  Recent  Labs Lab 06/11/15 0407 06/12/15 0442 06/13/15 0400 06/14/15 0425 06/14/15 2043 06/15/15 0325 06/16/15 0500 06/17/15 0555  NA 147* 147* 147* 150*  --  148* 146* 143  K 3.3* 4.5 4.5 4.3  --  4.1 3.8 4.3  CL 117* 118* 116* 118*  --  118* 114* 111  CO2 23 24 24 24   --  23 26 28   GLUCOSE 204* 281*  296* 263* 442* 374* 145* 221*  BUN 47* 34* 30* 43*  --  50* 43* 35*  CREATININE 1.93* 1.83* 1.84* 2.41*  --  2.33* 1.69* 1.44*  CALCIUM 9.2 9.7 10.0 10.5*  --  9.7 9.7 9.9  MG 2.4 2.4 2.1  --   --   --  2.4 2.3  PHOS 2.4* 1.8* 3.1  --   --   --  2.3* 2.3*   Estimated Creatinine Clearance: 59.1 mL/min (by C-G formula based on Cr of 1.44).   LIVER  Recent Labs Lab 06/13/15 0400 06/16/15 0500 06/17/15 0555  ALBUMIN  --  2.0* 2.0*  INR 1.12  --   --      INFECTIOUS  Recent Labs Lab 06/11/15  LATICACIDVEN 1.0     ENDOCRINE CBG (last 3)   Recent Labs  06/16/15 1906 06/16/15 2328 06/17/15 0343  GLUCAP 187* 186* 197*    ASSESSMENT / PLAN:  59 year old male SNF resident with complicated medical hx, presented to [facility] ED 12/5 with AMS after several hours of "agonal breathing" with intubation in the emergency department. Patient was treated for Escherichia coli & Klebsiella pneumonia. He was successfully extubated on 12/13 and has been weaned to room air. I spoke with the patient's daughter this morning regarding my concerns before his mental status understanding and she reports that he is difficult to understand at times. But usually he follows commands. Patient developed a fever on 12/11 and had repeat blood cultures performed which has shown gram-positive cocci in clusters in 2 out of 2 bottles. Vancomycin was restarted at that same time. We have cautiously restarted some of his home medications at this time. He was also titrated off of insulin drip yesterday to subcutaneous insulin. He seems to be tolerating tube feeds well. Given his continued clinical improvement I will transition the patient to a stepdown bed. I did notify his daughter of this plan of transition.  1. Acute hypoxic respiratory failure: Secondary to healthcare associated pneumonia/aspiration pneumonia. Resolved. Status post extubation 12/13. 2. Acute encephalopathy: Improving. Known h/o Pseudobulbar affect.  Baseline mental status is poor. Multifactorial given sedating medications as well as prior hypoxia. Continue to monitor closely and stepdown unit. 3. Gram-positive cocci bacteremia: Awaiting finalization of blood cultures. Day #3 of vancomycin. 4. Diabetes mellitus: Improved blood glucose control off of insulin drip. Continuing NPH 15 units subcutaneous every 6 hours and increasing to moderate dose sliding scale algorithm with Accu-Cheks every 4 hours. 5. Seizure: Occurred on 12/6 while on imipenem. Known h/o ICH. Reported prior history of "convulsions". No seizure activity since. EEG without focal activity that would suggest seizure. Continuing gabapentin via tube q8hr & Keppra via tube twice a day. Versed IV when necessary for seizure activity. Seizure precautions. 6. Acute on chronic renal failure: Resolving. Likely secondary to previous diuresis. Continuing to trend urine output with Foley catheter. Monitoring renal function with daily BUN/creatinine. 7. Status post renal transplant: Continuing Prograf & prednisone at home dose. Prograf level pending from 12/13 AM. 8. Escherichia coli & Klebsiella pneumonia: Likely secondary to aspiration. Status post treatment.  9. Fungal UTI: Patient had no Foley catheter on admission. Treated with 2 doses of Diflucan on 12/8 & 12/9. 10. Elevated troponin I: Likely demand ischemia. Continuing aspirin 81 mg daily & Lipitor 10 mg daily. 11. Prolonged QTc: EKG on 12/13 showed 471 ms. Continuing to monitor the patient on telemetry. 12. History of depression/anxiety: Continuing patient on Celexa & Lamictal. 13. History of hypertension: Holding amlodipine & Isordil. Restarting home hydralazine 25 mg via tube twice a day. 14. Hyperchloremia/hyponatremia: Resolved.  15. Anemia: Mild. No signs of active bleeding. 16. History of ICH: Known immobility of the right upper and lower extremities. No acute pathology on CT scan from 12/5. 17. History of B-cell lymphoma: Status  post XRT. Continuing to monitor cell counts with CBC daily. 18. History of GERD: Continuing Carafate & Protonix. 19. Diet: Tolerating tube feeds via PEG. Continuing free water flushes every 6 hours. Probiotic via tube twice a day. 20. Prophylaxis: SCDs, heparin subcutaneous every 8 hours, Protonix, & Carafate. 21. CODE STATUS: Prior discussions with family members by Dr. Chase Caller with daughters wishing the patient to remain full code. 22. Disposition: Transitioning patient to stepdown unit. Likely will need to transition back to his skilled nursing facility depending upon results of cultures and continued clinical improvement. Hospitalist to assume care & PCCM to sign off 12/15.  Justin [name], M.D. [facility] Pulmonary & Critical Care Pager:  [phone] After 3pm or if no response, call [phone] 06/17/2015 8:09 AM

## 2015-06-17 NOTE — Progress Notes (Signed)
Inpatient Diabetes Program Recommendations  AACE/ADA: New Consensus Statement on Inpatient Glycemic Control (2015)  Target Ranges:  Prepandial:   less than 140 mg/dL      Peak postprandial:   less than 180 mg/dL (1-2 hours)      Critically ill patients:  140 - 180 mg/dL   Review of Glycemic Control  Inpatient Diabetes Program Recommendations:  Insulin - Basal: consider using Lantus instead of NPH   Pt takes Lantus at home.  Thank you  Raoul Pitch BSN, RN,CDE Inpatient Diabetes Coordinator [phone] (team pager)

## 2015-06-17 NOTE — Progress Notes (Signed)
Nutrition Follow-up  DOCUMENTATION CODES:   Not applicable  INTERVENTION:    Continue Glucerna 1.2 formula at 60 ml/hr with Prostat liquid protein 30 ml BID via tube  Total TF regimen providing 1928 kcals, 116 gm protein, 1159 ml of free water  NUTRITION DIAGNOSIS:   Inadequate oral intake related to inability to eat as evidenced by NPO status, ongoing  GOAL:   Patient will meet greater than or equal to 90% of their needs, met  MONITOR:   TF tolerance, Labs, Weight trends, I & O's  ASSESSMENT:   59 y.o. 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 He was brought to [facility] evening of 12/5 after staff at SNF found him agonally breathing since 3 PM.  Patient from [facility].  Patient extubated 12/13.  Glucerna 1.2 formula currently infusing at goal rate of 60 ml/hr via PEG tube.  Also receiving Prostat liquid protein 30 ml BID via tube.  Free water flushes at 200 ml every 6 hours.  Acute encephalopathy improving.  Diet Order:  Diet NPO time specified  Skin:  Reviewed, no issues  Last BM:  12/13  Height:   Ht Readings from Last 1 Encounters:  06/12/15 6' (1.829 m)    Weight:   Wt Readings from Last 1 Encounters:  06/17/15 179 lb 3.7 oz (81.3 kg)    Ideal Body Weight:  81 kg  BMI:  Body mass index is 24.3 kg/(m^2).  Estimated Nutritional Needs:   Kcal:  1900-2100  Protein:  115-125 gm  Fluid:  per MD  EDUCATION NEEDS:   No education needs identified at this time  Katie , RD, LDN Pager #: [phone] After-Hours Pager #: [phone]  

## 2015-06-18 DIAGNOSIS — F411 Generalized anxiety disorder: Secondary | ICD-10-CM

## 2015-06-18 DIAGNOSIS — N189 Chronic kidney disease, unspecified: Secondary | ICD-10-CM

## 2015-06-18 DIAGNOSIS — F329 Major depressive disorder, single episode, unspecified: Secondary | ICD-10-CM

## 2015-06-18 DIAGNOSIS — J189 Pneumonia, unspecified organism: Secondary | ICD-10-CM | POA: Diagnosis present

## 2015-06-18 DIAGNOSIS — N390 Urinary tract infection, site not specified: Secondary | ICD-10-CM | POA: Diagnosis present

## 2015-06-18 DIAGNOSIS — J9601 Acute respiratory failure with hypoxia: Secondary | ICD-10-CM | POA: Diagnosis present

## 2015-06-18 DIAGNOSIS — K529 Noninfective gastroenteritis and colitis, unspecified: Secondary | ICD-10-CM | POA: Diagnosis present

## 2015-06-18 DIAGNOSIS — L97909 Non-pressure chronic ulcer of unspecified part of unspecified lower leg with unspecified severity: Secondary | ICD-10-CM | POA: Diagnosis present

## 2015-06-18 DIAGNOSIS — R7881 Bacteremia: Secondary | ICD-10-CM | POA: Diagnosis present

## 2015-06-18 DIAGNOSIS — I629 Nontraumatic intracranial hemorrhage, unspecified: Secondary | ICD-10-CM

## 2015-06-18 DIAGNOSIS — N179 Acute kidney failure, unspecified: Secondary | ICD-10-CM | POA: Diagnosis present

## 2015-06-18 DIAGNOSIS — C8300 Small cell B-cell lymphoma, unspecified site: Secondary | ICD-10-CM | POA: Diagnosis present

## 2015-06-18 DIAGNOSIS — L89152 Pressure ulcer of sacral region, stage 2: Secondary | ICD-10-CM

## 2015-06-18 DIAGNOSIS — N5089 Other specified disorders of the male genital organs: Secondary | ICD-10-CM | POA: Diagnosis present

## 2015-06-18 DIAGNOSIS — L97901 Non-pressure chronic ulcer of unspecified part of unspecified lower leg limited to breakdown of skin: Secondary | ICD-10-CM

## 2015-06-18 DIAGNOSIS — A6000 Herpesviral infection of urogenital system, unspecified: Secondary | ICD-10-CM | POA: Diagnosis present

## 2015-06-18 DIAGNOSIS — L89159 Pressure ulcer of sacral region, unspecified stage: Secondary | ICD-10-CM | POA: Diagnosis present

## 2015-06-18 DIAGNOSIS — G9340 Encephalopathy, unspecified: Secondary | ICD-10-CM | POA: Diagnosis present

## 2015-06-18 DIAGNOSIS — R569 Unspecified convulsions: Secondary | ICD-10-CM | POA: Diagnosis present

## 2015-06-18 DIAGNOSIS — J155 Pneumonia due to Escherichia coli: Secondary | ICD-10-CM | POA: Diagnosis present

## 2015-06-18 DIAGNOSIS — E119 Type 2 diabetes mellitus without complications: Secondary | ICD-10-CM | POA: Diagnosis present

## 2015-06-18 LAB — CULTURE, BLOOD (ROUTINE X 2)

## 2015-06-18 LAB — IRON AND TIBC
Iron: 40 ug/dL — ABNORMAL LOW (ref 45–182)
Saturation Ratios: 27 % (ref 17.9–39.5)
TIBC: 150 ug/dL — AB (ref 250–450)
UIBC: 110 ug/dL

## 2015-06-18 LAB — LIPID PANEL
CHOL/HDL RATIO: 5.5 ratio
Cholesterol: 133 mg/dL (ref 0–200)
HDL: 24 mg/dL — AB (ref >40)
LDL CALC: 43 mg/dL (ref 0–99)
Triglycerides: 328 mg/dL — ABNORMAL HIGH (ref <150)
VLDL: 66 mg/dL — AB (ref 0–40)

## 2015-06-18 LAB — TROPONIN I: TROPONIN I: 0.04 ng/mL — AB (ref <0.031)

## 2015-06-18 LAB — RENAL FUNCTION PANEL
Albumin: 1.9 g/dL — ABNORMAL LOW (ref 3.5–5.0)
Anion gap: 6 (ref 5–15)
BUN: 31 mg/dL — AB (ref 6–20)
CHLORIDE: 107 mmol/L (ref 101–111)
CO2: 28 mmol/L (ref 22–32)
CREATININE: 1.28 mg/dL — AB (ref 0.61–1.24)
Calcium: 9.7 mg/dL (ref 8.9–10.3)
GFR calc Af Amer: >60 mL/min (ref >60)
GFR, EST NON AFRICAN AMERICAN: 59 mL/min — AB (ref >60)
Glucose, Bld: 153 mg/dL — ABNORMAL HIGH (ref 65–99)
POTASSIUM: 3.9 mmol/L (ref 3.5–5.1)
Phosphorus: 2.2 mg/dL — ABNORMAL LOW (ref 2.5–4.6)
Sodium: 141 mmol/L (ref 135–145)

## 2015-06-18 LAB — GLUCOSE, CAPILLARY
GLUCOSE-CAPILLARY: 116 mg/dL — AB (ref 65–99)
GLUCOSE-CAPILLARY: 154 mg/dL — AB (ref 65–99)
GLUCOSE-CAPILLARY: 156 mg/dL — AB (ref 65–99)
GLUCOSE-CAPILLARY: 169 mg/dL — AB (ref 65–99)
GLUCOSE-CAPILLARY: 185 mg/dL — AB (ref 65–99)
GLUCOSE-CAPILLARY: 188 mg/dL — AB (ref 65–99)
Glucose-Capillary: 106 mg/dL — ABNORMAL HIGH (ref 65–99)

## 2015-06-18 LAB — FOLATE: Folate: 12.7 ng/mL (ref >5.9)

## 2015-06-18 LAB — RETICULOCYTES
RBC.: 4.6 MIL/uL (ref 4.22–5.81)
RETIC CT PCT: 1.2 % (ref 0.4–3.1)
Retic Count, Absolute: 55.2 10*3/uL (ref 19.0–186.0)

## 2015-06-18 LAB — CBC WITH DIFFERENTIAL/PLATELET
BASOS PCT: 0 %
Basophils Absolute: 0 10*3/uL (ref 0.0–0.1)
EOS ABS: 0.1 10*3/uL (ref 0.0–0.7)
EOS PCT: 1 %
HCT: 39.9 % (ref 39.0–52.0)
Hemoglobin: 11.8 g/dL — ABNORMAL LOW (ref 13.0–17.0)
LYMPHS ABS: 1.7 10*3/uL (ref 0.7–4.0)
Lymphocytes Relative: 23 %
MCH: 26.5 pg (ref 26.0–34.0)
MCHC: 29.6 g/dL — AB (ref 30.0–36.0)
MCV: 89.5 fL (ref 78.0–100.0)
MONO ABS: 0.2 10*3/uL (ref 0.1–1.0)
Monocytes Relative: 3 %
NEUTROS ABS: 5.3 10*3/uL (ref 1.7–7.7)
NEUTROS PCT: 73 %
PLATELETS: 176 10*3/uL (ref 150–400)
RBC: 4.46 MIL/uL (ref 4.22–5.81)
RDW: 14.2 % (ref 11.5–15.5)
WBC: 7.3 10*3/uL (ref 4.0–10.5)
nRBC: 0 /100 WBC

## 2015-06-18 LAB — FERRITIN: FERRITIN: 153 ng/mL (ref 24–336)

## 2015-06-18 LAB — VITAMIN B12: VITAMIN B 12: 916 pg/mL — AB (ref 180–914)

## 2015-06-18 LAB — MAGNESIUM: MAGNESIUM: 2.2 mg/dL (ref 1.7–2.4)

## 2015-06-18 LAB — TACROLIMUS LEVEL: TACROLIMUS (FK506) - LABCORP: 4.7 ng/mL (ref 2.0–20.0)

## 2015-06-18 NOTE — Progress Notes (Addendum)
Patient was transferred to [facility], room 7 with monitor on. Report given to the nurse. Vital signs were stable. Patient's daughter was here and is aware of the transfer.

## 2015-06-18 NOTE — Progress Notes (Signed)
[place] TEAM 1 - Stepdown/ICU TEAM Progress Note  Justin [name] [id number] DOB: 06/28/1956 DOA: 06/08/2015 PCP: Garwin Brothers, MD  Admit HPI / Brief Narrative: 59 y.o. 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 He was brought to [facility] evening of 12/5 after staff at SNF found him agonally breathing since 3 PM.  In ED, he was completely unresponsive and agonal. He subsequently required intubation for airway protection. During intubation, a piece of tissue paper was removed from pt's posterior pharynx.  He had recent admission 04/05/15 through 04/10/15 for urosepsis.  HPI/Subjective: 12/15, A/O 0. Patient will track you with his eyes if you call his name. Patient only able to state he has back pain. Remainder of speech is unintelligible.   Assessment/Plan: Acute hypoxic respiratory failure: - Secondary to healthcare associated pneumonia/aspiration pneumonia, Resolved. Status post extubation 12/13.  Acute encephalopathy:  -Improving. Per RN, daughter stated patient is at baseline.  -Known h/o Pseudobulbar affect. Baseline mental status is poor.   Gram-positive cocci bacteremia (2/2 positive coag negative staph) -Continue vancomycin; 2 weeks worth of treatment? -Repeat cultures in A.m.  Escherichia coli & Klebsiella pneumonia: - Likely secondary to aspiration. Status post treatment.  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  Sacral decubitus ulcer/scrotal ulcers/bilateral lower extremity ulcers -Wound care was consulted on 12/6 and recommendations were that the area of infection/wounds was so severe it was beyond their scope of practice and recommended ID consult.   Seizure:  -Occurred on 12/6 while on imipenem. Known h/o ICH. Reported prior history of "convulsions". No seizure activity since.  -EEG without focal activity that would suggest seizure.  -Continuing Gabapentin 100 mg via tube q8hr  -Continue  Keppra 500 mg via tube BID  -Versed IV PRN seizure activity.  -Seizure precautions.  Acute on chronic renal failure: S/P Renal transplant -Resolving. Likely secondary to previous diuresis.  -Strict I and O's since admission +15 L -Daily weight admission weight = 80.8 kg                         12/15 weight= 82.1 kg -Continue Prograf -Prograf level = 4.7 WNL -Continue prednisone  Funguria:  -Patient had no Foley catheter on admission. Treated with 2 doses of Diflucan on 12/8 & 12/9. -Repeat urine culture in A.m.  HTN -Hydralazine 25 mgBID -Holding amlodipine & Isordil.  -Elevated troponin I: - Likely demand ischemia, will continue to trend.  -Continuing aspirin 81 mg daily  -Continue Lipitor 10 mg daily. -Lipid panel pending  Prolonged QTc:  -EKG on 12/13 showed 471 ms. Continuing to monitor the patient on telemetry.  Depression/Anxiety:  -Continuing Celexa 20 mg daily  -Continue Lamictal 50 mg daily.  Anemia -Negative sign of active bleeding -Most likely secondary to chronic  disease obtain anemia panel  ICH:  -Residual right hemiparesis  -CT scan from 12/5. Negative acute findings  B-cell lymphoma:  -S/P XRT. Continuing to monitor  cell counts with CBC daily.  Diet:  -Possibly causing patient's chronic diarrhea although could have infectious component. See chronic diarrhea  -Continue tube feeds via PEG. Continuing free water flushes every 6 hours. Probiotic via tube twice a day.  Goals of care -CODE STATUS: Prior discussions with family members by Dr. Chase Caller with daughters wishing the patient to remain full code. -Placed palliative care consult considering patient's poor quality of life, multiple drug-resistant infections, and baseline mental status patient will continue to do poorly. All her care to set up a meeting with family as per nursing staff family does not seem to have a good grasp on the seriousness of patient's current health problems.     Code Status: FULL Family Communication: no family present at time of exam Disposition Plan: Will await goals of care meeting with family    Consultants: Dr.Jennings E Nestor PCCM [facility] hospitalist    Procedure/Significant Events: TTE 10/3: Mild LVH w/ EF 55-60%. Normal regional wall motion. Grade 1 diastolic dysfunction. RV normal in size & function. CT HEAD 12/5: No acute intracranial pathology. Chronic encephalomalacia of left cerebral hemisphere. Moderate cortical volume loss with small vessel ischemic microangiopathy. EEG 12/6: Severe generalized slowing on Fentanyl. Pattern consistent w/ hypoxic injury or toxic encephalopathy. No evidence of seizure activity. Port CXR 12/11: Lordotic view. ETT 4cm above carina. Left IJ w/ tip in SVC. No opacity. EKG 12/13: QTc 468ms. Normal sinus rhythm.   Culture 12/6 MRSA by PCR negative 12/6:UrinePositive Yeast 100K 12/6 tracheal aspirate positive Klebsiella pneumonia(sensitive Cipro/Gent/Imipenem/Zosyn/Tobra)/Escherichia coli (sensitive Gent/Imipenem/Tobra/Zosyn/Bactrim) 12/11 blood left hand 2 positive coag negative staph    Antibiotics: Vanc 12/5 - 12/8; 12/12>>>  Aztreonam  12/5 - 12/6  Imipenem 12/7 Merrem (change due to [place]) 12/7 - 12/10 Diflucan 12/8 (yeast in Urine) - 12/09   DVT prophylaxis: SCD, subcutaneous heparin   Devices    LINES / TUBES:  L IJ CVL 12/7>>> FOLEY 12/6>>> PEG>>> OGT 12/5 - 12/13 OETT 7.5 12/5 - 12/13    Continuous Infusions: . sodium chloride 20 mL/hr at 06/18/15 0900  . feeding supplement (GLUCERNA 1.2 CAL) 1,000 mL (06/18/15 0900)    Objective: VITAL SIGNS: Temp: 98.5 F (36.9 C) (12/15 1903) Temp Source: Oral (12/15 1903) BP: 166/83 mmHg (12/15 1903) Pulse Rate: 79 (12/15 1903) SPO2; FIO2:   Intake/Output Summary (Last 24 hours) at 06/18/15 2006 Last data filed at 06/18/15 1800  Gross per 24 hour  Intake   2780 ml  Output   2130 ml  Net    650 ml     Exam: General: A/O 0, mumbles unintelligibly except for back hurts, No acute respiratory distress Eyes: Negative headache, negative scleral hemorrhage ENT: Negative Runny nose, negative ear pain, negative gingival bleeding, Neck:  Negative scars, masses, torticollis, lymphadenopathy, JVD Lungs: Clear to auscultation bilaterally without wheezes or crackles Cardiovascular: Regular rate and rhythm without murmur gallop or rub normal S1 and S2 Abdomen:negative abdominal pain, nondistended, positive soft, bowel sounds, no rebound, no ascites, no appreciable mass, PEG tube in place negative sign of infection Genitourinary; warts on penis." Pt has chronic condition with multiple red raised vesicles, some are dry crusted and yellow. Patchy areas of full thickness tissue loss, mod amt tan drainage with odor. Shaft of penis is red and macerated. Appearance is consistent with probable genital herpes.  Multiple stage II ulcers on shaft of penis, scrotum. Stage II decubitus ulcer  Extremities: No significant cyanosis, clubbing. Multiple bilateral lower extremity ulcers Psychiatric:   unable to assess secondary to altered mental status (supposedly baseline)    Neurologic:  Cranial nerves II through XII intact, tongue/uvula midline,  right hemiparesis. Unable to assess further secondary to altered mental status.    Data Reviewed: Basic Metabolic Panel:  Recent Labs Lab 06/12/15 0442 06/13/15 0400 06/14/15 0425 06/14/15 2043 06/15/15 0325 06/16/15 0500 06/17/15 0555 06/18/15 0442 06/18/15 0443  NA 147* 147* 150*  --  148* 146* 143  --  141  K 4.5 4.5 4.3  --  4.1 3.8 4.3  --  3.9  CL 118* 116* 118*  --  118* 114* 111  --  107  CO2 24 24 24   --  23 26 28   --  28  GLUCOSE 281* 296* 263* 442* 374* 145* 221*  --  153*  BUN 34* 30* 43*  --  50* 43* 35*  --  31*  CREATININE 1.83* 1.84* 2.41*  --  2.33* 1.69* 1.44*  --  1.28*  CALCIUM 9.7 10.0 10.5*  --  9.7 9.7 9.9  --  9.7  MG 2.4 2.1  --   --   --  2.4 2.3 2.2  --   PHOS 1.8* 3.1  --   --   --  2.3* 2.3*  --  2.2*   Liver Function Tests:  Recent Labs Lab 06/16/15 0500 06/17/15 0555 06/18/15 0443  ALBUMIN 2.0* 2.0* 1.9*   No results for input(s): LIPASE, AMYLASE in the last 168 hours. No results for input(s): AMMONIA in the last 168 hours. CBC:  Recent Labs Lab 06/14/15 0425 06/15/15 0325 06/16/15 0500 06/17/15 0555 06/18/15 0442  WBC 6.2 7.7 7.1 7.4 7.3  NEUTROABS 3.1 4.7 5.1 5.6 5.3  HGB 14.5 12.7* 11.6* 12.0* 11.8*  HCT 48.3 43.3 39.4 41.4 39.9  MCV 89.0 90.2 89.7 90.2 89.5  PLT 156 152 139* 157 176   Cardiac Enzymes:  Recent Labs Lab 06/13/15 0400  TROPONINI 0.09*   BNP (last 3 results) No results for input(s): BNP in the last 8760 hours.  ProBNP (last 3 results) No results for input(s): PROBNP in the last 8760 hours.  CBG:  Recent Labs Lab 06/17/15 2350 06/18/15 0351 06/18/15 0742 06/18/15 1142 06/18/15 1605  GLUCAP 156* 154* 106* 116* 185*    Recent Results (from the past 240 hour(s))  Culture, blood (routine x 2)     Status: None   Collection Time: 06/08/15  9:18 PM  Result Value Ref Range Status   Specimen Description BLOOD LEFT HAND   Final   Special Requests BOTTLES DRAWN AEROBIC AND ANAEROBIC 5CC  Final   Culture NO GROWTH 5 DAYS  Final   Report Status 06/13/2015 FINAL  Final  MRSA PCR Screening     Status: None   Collection Time: 06/09/15  4:41 AM  Result Value Ref Range Status   MRSA by PCR NEGATIVE NEGATIVE Final    Comment:        The GeneXpert MRSA Assay (FDA approved for NASAL specimens only), is one component of a comprehensive MRSA colonization surveillance program. It is not intended to diagnose MRSA infection nor to guide or monitor treatment for MRSA infections.   Urine culture     Status: None   Collection Time: 06/09/15  7:11 AM  Result Value Ref Range Status   Specimen Description URINE, CATHETERIZED  Final   Special Requests NONE  Final   Culture >=100,000 COLONIES/mL YEAST  Final   Report Status 06/10/2015 FINAL  Final  Culture, respiratory (tracheal aspirate)     Status: None   Collection Time: 06/09/15  4:35 PM  Result Value Ref Range Status   Specimen Description TRACHEAL ASPIRATE  Final   Special Requests NONE  Final   Gram Stain   Final    ABUNDANT WBC PRESENT, PREDOMINANTLY PMN NO SQUAMOUS EPITHELIAL CELLS SEEN ABUNDANT GRAM NEGATIVE RODS FEW GRAM POSITIVE COCCI IN PAIRS FEW YEAST    Culture   Final    ABUNDANT KLEBSIELLA PNEUMONIAE Note: Confirmed Extended Spectrum Beta-Lactamase Producer (ESBL) CRITICAL RESULT CALLED TO, READ BACK BY AND VERIFIED WITH: L VERNON RN 130PM 06/13/15 GUSTK ABUNDANT ESCHERICHIA COLI Note: Confirmed Extended Spectrum Beta-Lactamase Producer (ESBL) Performed at [facility]    Report Status 06/13/2015 FINAL  Final   Organism ID, Bacteria KLEBSIELLA PNEUMONIAE  Final   Organism ID, Bacteria ESCHERICHIA COLI  Final      Susceptibility   Escherichia coli - MIC*    AMPICILLIN >=32 RESISTANT Resistant     AMPICILLIN/SULBACTAM 16 INTERMEDIATE Intermediate     CEFAZOLIN >=64 RESISTANT Resistant     CEFEPIME RESISTANT      CEFTAZIDIME  RESISTANT      CEFTRIAXONE >=64 RESISTANT Resistant     CIPROFLOXACIN >=4 RESISTANT Resistant     GENTAMICIN <=1 SENSITIVE Sensitive     IMIPENEM <=0.25 SENSITIVE Sensitive     PIP/TAZO <=4 SENSITIVE Sensitive     TOBRAMYCIN <=1 SENSITIVE Sensitive     TRIMETH/SULFA Value in next row Sensitive      <=20 SENSITIVE(NOTE)    * ABUNDANT ESCHERICHIA COLI   Klebsiella pneumoniae - MIC*    AMPICILLIN Value in next row Resistant      <=20 SENSITIVE(NOTE)    AMPICILLIN/SULBACTAM Value in next row Resistant      <=20 SENSITIVE(NOTE)    CEFAZOLIN Value in next row Resistant      <=20 SENSITIVE(NOTE)    CEFEPIME Value in next row Resistant      <=20 SENSITIVE(NOTE)    CEFTAZIDIME Value in next row Resistant      <=20 SENSITIVE(NOTE)    CEFTRIAXONE Value in next row Resistant      <=20 SENSITIVE(NOTE)    CIPROFLOXACIN Value in next row Sensitive      <=20 SENSITIVE(NOTE)    GENTAMICIN Value in next row Sensitive      <=20 SENSITIVE(NOTE)    IMIPENEM Value in next row Sensitive      <=20 SENSITIVE(NOTE)    PIP/TAZO Value in next row Sensitive      <=20 SENSITIVE(NOTE)    TOBRAMYCIN Value in next row Sensitive      <=20 SENSITIVE(NOTE)    TRIMETH/SULFA Value in next row Resistant      >=320 RESISTANT(NOTE)    * ABUNDANT KLEBSIELLA PNEUMONIAE  Culture, blood (routine x 2)     Status: None   Collection Time: 06/14/15  3:25 PM  Result Value Ref Range Status   Specimen Description BLOOD LEFT HAND  Final   Special Requests IN PEDIATRIC BOTTLE 3CC  Final   Culture  Setup Time   Final    GRAM POSITIVE COCCI IN CLUSTERS AEROBIC BOTTLE ONLY CRITICAL RESULT CALLED TO, READ BACK BY AND VERIFIED WITH: M MILLS 06/15/15 @ 0936 M VESTAL    Culture   Final    STAPHYLOCOCCUS SPECIES (COAGULASE NEGATIVE) SUSCEPTIBILITIES PERFORMED ON  PREVIOUS CULTURE WITHIN THE LAST 5 DAYS.    Report Status 06/18/2015 FINAL  Final  Culture, blood (routine x 2)     Status: None   Collection Time: 06/14/15  4:44 PM    Result Value Ref Range Status   Specimen Description BLOOD LEFT HAND  Final   Special Requests IN PEDIATRIC BOTTLE 3CC  Final   Culture  Setup Time   Final    GRAM POSITIVE COCCI IN CLUSTERS AEROBIC BOTTLE ONLY CRITICAL RESULT CALLED TO, READ BACK BY AND VERIFIED WITH: R SARINE RN @2227  06/15/15 MKELLY    Culture STAPHYLOCOCCUS SPECIES (COAGULASE NEGATIVE)  Final   Report Status 06/18/2015 FINAL  Final   Organism ID, Bacteria STAPHYLOCOCCUS SPECIES (COAGULASE NEGATIVE)  Final      Susceptibility   Staphylococcus species (coagulase negative) - MIC*    CIPROFLOXACIN >=8 RESISTANT Resistant     ERYTHROMYCIN >=8 RESISTANT Resistant     GENTAMICIN <=0.5 SENSITIVE Sensitive     OXACILLIN >=4 RESISTANT Resistant     TETRACYCLINE <=1 SENSITIVE Sensitive     VANCOMYCIN <=0.5 SENSITIVE Sensitive     TRIMETH/SULFA <=10 SENSITIVE Sensitive     CLINDAMYCIN >=8 RESISTANT Resistant     RIFAMPIN <=0.5 SENSITIVE Sensitive     Inducible Clindamycin NEGATIVE Sensitive     * STAPHYLOCOCCUS SPECIES (COAGULASE NEGATIVE)     Studies:  Recent x-ray studies have been reviewed in detail by the Attending Physician  Scheduled Meds:  Scheduled Meds: . antiseptic oral rinse  7 mL Mouth Rinse BID  . aspirin  81 mg Per Tube Daily  . atorvastatin  10 mg Per Tube q1800  . citalopram  20 mg Per Tube Daily  . feeding supplement (PRO-STAT SUGAR FREE 64)  30 mL Per Tube BID  . free water  200 mL Per Tube 4 times per day  . gabapentin  100 mg Per Tube 3 times per day  . Gerhardt's butt cream   Topical BID  . heparin  5,000 Units Subcutaneous 3 times per day  . hydrALAZINE  25 mg Per Tube Q12H  . insulin aspart  0-15 Units Subcutaneous 6 times per day  . insulin NPH Human  15 Units Subcutaneous Q6H  . BIOGAIA PROBIOTIC  5 mL Per Tube BID  . lamoTRIgine  50 mg Per Tube Daily  . levETIRAcetam  500 mg Per Tube BID  . pantoprazole sodium  40 mg Per Tube Daily  . predniSONE  5 mg Per Tube Daily  . sodium  chloride  10-40 mL Intracatheter Q12H  . sucralfate  1 g Per Tube Q8H  . tacrolimus  3.5 mg Per Tube BID  . vancomycin  750 mg Intravenous Q12H    Time spent on care of this patient: 40 mins   Maan Zarcone, Geraldo Docker , MD  [facility] Office  [phone] Pager - [phone]  On-Call/Text Page:      [email or web address]      password [email or web address]  If 7PM-7AM, please contact night-coverage [email or web address] Password [email or web address] 06/18/2015, 8:06 PM   LOS: 10 days   Care during the described time interval was provided by me .  I have reviewed this patient's available data, including medical history, events of note, physical examination, and all test results as part of my evaluation. I have personally reviewed and interpreted all radiology studies.   Dia Crawford, MD [phone] Pager

## 2015-06-19 DIAGNOSIS — J150 Pneumonia due to Klebsiella pneumoniae: Secondary | ICD-10-CM | POA: Diagnosis present

## 2015-06-19 DIAGNOSIS — J155 Pneumonia due to Escherichia coli: Secondary | ICD-10-CM | POA: Diagnosis present

## 2015-06-19 DIAGNOSIS — I629 Nontraumatic intracranial hemorrhage, unspecified: Secondary | ICD-10-CM | POA: Diagnosis present

## 2015-06-19 DIAGNOSIS — Z515 Encounter for palliative care: Secondary | ICD-10-CM | POA: Diagnosis present

## 2015-06-19 LAB — CBC WITH DIFFERENTIAL/PLATELET
Basophils Absolute: 0 10*3/uL (ref 0.0–0.1)
Basophils Relative: 0 %
Eosinophils Absolute: 0 10*3/uL (ref 0.0–0.7)
Eosinophils Relative: 0 %
HEMATOCRIT: 40.6 % (ref 39.0–52.0)
HEMOGLOBIN: 12.1 g/dL — AB (ref 13.0–17.0)
LYMPHS ABS: 1.6 10*3/uL (ref 0.7–4.0)
Lymphocytes Relative: 21 %
MCH: 26.3 pg (ref 26.0–34.0)
MCHC: 29.8 g/dL — AB (ref 30.0–36.0)
MCV: 88.3 fL (ref 78.0–100.0)
MONOS PCT: 3 %
Monocytes Absolute: 0.2 10*3/uL (ref 0.1–1.0)
NEUTROS ABS: 5.6 10*3/uL (ref 1.7–7.7)
NEUTROS PCT: 76 %
Platelets: 192 10*3/uL (ref 150–400)
RBC: 4.6 MIL/uL (ref 4.22–5.81)
RDW: 14.1 % (ref 11.5–15.5)
WBC: 7.5 10*3/uL (ref 4.0–10.5)

## 2015-06-19 LAB — COMPREHENSIVE METABOLIC PANEL
ALBUMIN: 1.9 g/dL — AB (ref 3.5–5.0)
ALT: 20 U/L (ref 17–63)
ANION GAP: 5 (ref 5–15)
AST: 20 U/L (ref 15–41)
Alkaline Phosphatase: 78 U/L (ref 38–126)
BUN: 28 mg/dL — ABNORMAL HIGH (ref 6–20)
CO2: 28 mmol/L (ref 22–32)
Calcium: 9.7 mg/dL (ref 8.9–10.3)
Chloride: 105 mmol/L (ref 101–111)
Creatinine, Ser: 1.15 mg/dL (ref 0.61–1.24)
GFR calc Af Amer: >60 mL/min (ref >60)
GFR calc non Af Amer: >60 mL/min (ref >60)
GLUCOSE: 207 mg/dL — AB (ref 65–99)
POTASSIUM: 4.4 mmol/L (ref 3.5–5.1)
SODIUM: 138 mmol/L (ref 135–145)
Total Bilirubin: 0.5 mg/dL (ref 0.3–1.2)
Total Protein: 6.5 g/dL (ref 6.5–8.1)

## 2015-06-19 LAB — GASTROINTESTINAL PANEL BY PCR, STOOL (REPLACES STOOL CULTURE)
ASTROVIRUS: NOT DETECTED
Adenovirus F40/41: NOT DETECTED
CAMPYLOBACTER SPECIES: NOT DETECTED
Cryptosporidium: NOT DETECTED
Cyclospora cayetanensis: NOT DETECTED
E. COLI O157: NOT DETECTED
ENTEROAGGREGATIVE E COLI (EAEC): NOT DETECTED
ENTEROTOXIGENIC E COLI (ETEC): NOT DETECTED
Entamoeba histolytica: NOT DETECTED
Enteropathogenic E coli (EPEC): NOT DETECTED
GIARDIA LAMBLIA: NOT DETECTED
NOROVIRUS GI/GII: NOT DETECTED
PLESIMONAS SHIGELLOIDES: NOT DETECTED
Rotavirus A: NOT DETECTED
SALMONELLA SPECIES: NOT DETECTED
SAPOVIRUS (I, II, IV, AND V): NOT DETECTED
SHIGELLA/ENTEROINVASIVE E COLI (EIEC): NOT DETECTED
Shiga like toxin producing E coli (STEC): NOT DETECTED
VIBRIO CHOLERAE: NOT DETECTED
Vibrio species: NOT DETECTED
Yersinia enterocolitica: NOT DETECTED

## 2015-06-19 LAB — HIV ANTIBODY (ROUTINE TESTING W REFLEX): HIV SCREEN 4TH GENERATION: NONREACTIVE

## 2015-06-19 LAB — GLUCOSE, CAPILLARY
GLUCOSE-CAPILLARY: 167 mg/dL — AB (ref 65–99)
GLUCOSE-CAPILLARY: 132 mg/dL — AB (ref 65–99)
GLUCOSE-CAPILLARY: 75 mg/dL (ref 65–99)
Glucose-Capillary: 132 mg/dL — ABNORMAL HIGH (ref 65–99)
Glucose-Capillary: 164 mg/dL — ABNORMAL HIGH (ref 65–99)
Glucose-Capillary: 61 mg/dL — ABNORMAL LOW (ref 65–99)
Glucose-Capillary: 120 mg/dL — ABNORMAL HIGH (ref 65–99)

## 2015-06-19 LAB — FECAL LACTOFERRIN, QUANT: Fecal Lactoferrin: POSITIVE

## 2015-06-19 LAB — MAGNESIUM: Magnesium: 2.3 mg/dL (ref 1.7–2.4)

## 2015-06-19 LAB — TROPONIN I
TROPONIN I: 0.05 ng/mL — AB (ref <0.031)
Troponin I: 0.08 ng/mL — ABNORMAL HIGH (ref <0.031)

## 2015-06-19 MED ORDER — DEXTROSE 50 % IV SOLN
INTRAVENOUS | Status: AC
  Administered 2015-06-19: 50 mL
  Filled 2015-06-19: qty 50

## 2015-06-19 NOTE — Care Management Important Message (Signed)
Important Message  Patient Details  Name: Justin [name] MRN: [id number] Date of Birth: 01/16/1956   Medicare Important Message Given:  Yes    Lacretia Leigh, RN 06/19/2015, 10:40 AM

## 2015-06-19 NOTE — Consult Note (Signed)
Consultation Note Date: 06/19/2015   Patient Name: Justin [name]  DOB: 1955/12/24  MRN: [id number]  Age / Sex: 60 y.o., male  PCP: Garwin Brothers, MD Referring Physician: Allie Bossier, MD  Reason for Consultation: Establishing goals of care    Clinical Assessment/Narrative: Pt is a 59 yo man with multiple medical problems: ESRD with kidney transplant, chronic immunosuppression, B cell lymphoma, ICH requiring trach and PEG now admitted from SNF after being found minimally conscious with agonal resp. He was on full ventilatory support in ICU on admission. He has now transitioned to trach collar and to step down unit. Pt has severe and multiple areas of skin breakdown and lesions which appear herpetic. Wound care consulted and felt the nature of his breakdown was out of their scope of practice and recommended ID. He has multi-drug resistant organisms, now septic  and fungus in his urine. PMT was consulted in October but daughters refused to meet with provider. Pt is now alert. He just keeps saying " Im fine". He appears to have been living in a SNF for about 7 years with very poor functional status as well as quality of life.  Contacts/Participants in Discussion: Primary Decision Maker: April and Faith Loyd Relationship to Patient daughters HCPOA: yes  Spoke to April Saad to arrange PMT meeting with Dr. Sherral Hammers for tomorrow at 1300, 12/17. Agreed to check with her sister and call me back  SUMMARY OF RECOMMENDATIONS Trying to arrange family meeting with 2 daughters for 12/17 @ 1300, with Dr. Sherral Hammers as well In the interim full scope of treatment continues Family as refused to meet with PMT in Oct. 2016  Code Status/Advance Care Planning: Full code    Code Status Orders        Start     Ordered   06/08/15 1945  Full code   Continuous     06/08/15 1947      Other Directives:None  Symptom Management:    Dyspnea: Improving. Now off full vent support. Cont with trach, targeted pulmonary treatment  Seizures: No seizure activity since admission. Cont Neurontin, keppra and lamictal and versed for emergeny  Palliative Prophylaxis:   Delirium Protocol, Frequent Pain Assessment, Oral Care and Turn Reposition  Additional Recommendations (Limitations, Scope, Preferences):  Full Scope Treatment until we can meet with family   Psycho-social/Spiritual:  Support System: Poor Desire for further Chaplaincy support:no   Prognosis: Less than 6 months but could have a precipitous decline in the setting for high risk for PNA, UTI, skin infection and multi-drug resistance  Discharge Planning: TBD   Chief Complaint/ Primary Diagnoses: Present on Admission:  . Aspiration pneumonia ([place]) . Hyperglycemia . Hyperglycemic hyperosmolar nonketotic coma ([place]) . CKD (chronic kidney disease) . Seizure ([place]) . Encephalopathy acute . Endotracheally intubated . Acute respiratory failure with hypoxia ([place]) . Pressure ulcer . Acute respiratory failure with hypoxemia ([place]) . Acute encephalopathy . Gram-positive cocci bacteremia . Pneumonia due to Escherichia coli ([place]) . Bilateral pneumonia . Urinary tract infectious disease . Genital herpes . Chronic diarrhea . Diabetes mellitus type 2 in nonobese (HCC) . Sacral decubitus ulcer . Scrotal ulcer . Lower extremity ulceration ([place]) . Seizures ([place]) . Acute on chronic renal failure ([place]) . Anxiety state . Small B-cell lymphoma ([place]) . Pneumonia of both lungs due to Escherichia coli ([place]) . Pneumonia due to Klebsiella pneumoniae ([place]) . Hemorrhage, intracranial ([place])  I have reviewed the medical record, interviewed the patient and family, and examined the patient. The following aspects  are pertinent.  Past Medical History  Diagnosis Date  . Hypertension   . Dyslipidemia   . Carpal tunnel syndrome   . A-V fistula ([place])   . Anemia   .  Hemorrhoids   . Hyperparathyroidism   . Intracranial hemorrhage ([place])   . Hypotension   . Hyperkalemia   . Kidney disease   . Sepsis(995.91)   . GI bleeding   . DVT (deep venous thrombosis) ([place])   . PVD (peripheral vascular disease) ([place])   . DM (diabetes mellitus) ([place])   . Cellulitis   . IV infiltration     Right upper extremity  . PPD positive   . 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 Hypokinesis     EF of 60% with mild anterior hypokinesis, minimal coronary artery disease on catheterization in December of 2007  . Gout   . Renal transplant, status post 10/19/2014  . Pseudobulbar affect   . GERD (gastroesophageal reflux disease)    Social History   Social History  . Marital Status: Single    Spouse Name: N/A  . Number of Children: 2  . Years of Education: N/A   Occupational History  . Disabled    Social History Main Topics  . Smoking status: Former Smoker    Quit date: 01/06/2001  . Smokeless tobacco: None  . Alcohol Use: No  . Drug Use: Yes     Comment: Has had a previous history of Cocaine Abuse  . Sexual Activity: Not Asked   Other Topics Concern  . None   Social History Narrative   Presently living at the nursing home.   He is a full code.   Family History  Problem Relation Age of Onset  . Heart disease Mother   . CAD Mother   . Diabetes type II Mother   . Hypertension Mother   . Cancer Neg Hx    Scheduled Meds: . antiseptic oral rinse  7 mL Mouth Rinse BID  . aspirin  81 mg Per Tube Daily  . atorvastatin  10 mg Per Tube q1800  . citalopram  20 mg Per Tube Daily  . feeding supplement (PRO-STAT SUGAR FREE 64)  30 mL Per Tube BID  . free water  200 mL Per Tube 4 times per day  . gabapentin  100 mg Per Tube 3 times per day  . Gerhardt's butt cream   Topical BID  . heparin  5,000 Units Subcutaneous 3 times per day  . hydrALAZINE  25 mg Per Tube Q12H  . insulin aspart  0-15 Units Subcutaneous 6  times per day  . insulin NPH Human  15 Units Subcutaneous Q6H  . BIOGAIA PROBIOTIC  5 mL Per Tube BID  . lamoTRIgine  50 mg Per Tube Daily  . levETIRAcetam  500 mg Per Tube BID  . pantoprazole sodium  40 mg Per Tube Daily  . predniSONE  5 mg Per Tube Daily  . sodium chloride  10-40 mL Intracatheter Q12H  . sucralfate  1 g Per Tube Q8H  . tacrolimus  3.5 mg Per Tube BID  . vancomycin  750 mg Intravenous Q12H   Continuous Infusions: . sodium chloride 10 mL/hr at 06/19/15 0900  . feeding supplement (GLUCERNA 1.2 CAL) 1,000 mL (06/19/15 0900)   PRN Meds:.sodium chloride, acetaminophen (TYLENOL) oral liquid 160 mg/5 mL, ipratropium-albuterol, midazolam, sodium chloride Medications Prior to Admission:  Prior to Admission medications   Medication  Sig Start Date End Date Taking? Authorizing Provider  allopurinol (ZYLOPRIM) 100 MG tablet Place 100 mg into feeding tube daily.    Yes Historical Provider, MD  amLODipine (NORVASC) 5 MG tablet Take 1 tablet (5 mg total) by mouth daily. Patient taking differently: Place 5 mg into feeding tube daily.  04/10/15  Yes Oswald Hillock, MD  aspirin 81 MG tablet Place 81 mg into feeding tube daily.    Yes Historical Provider, MD  atorvastatin (LIPITOR) 10 MG tablet Place 10 mg into feeding tube daily.    Yes Historical Provider, MD  cefTRIAXone (ROCEPHIN) 1 G injection Inject 1 g into the muscle once. For aspiration   Yes Historical Provider, MD  citalopram (CELEXA) 10 MG tablet Place 20 mg into feeding tube daily.    Yes Historical Provider, MD  Cranberry 425 MG CAPS Place 425 mg into feeding tube daily.    Yes Historical Provider, MD  Dextromethorphan-Quinidine 20-10 MG CAPS 1 capsule by PEG Tube route every 12 (twelve) hours.    Yes Historical Provider, MD  fentaNYL (DURAGESIC - DOSED MCG/HR) 25 MCG/HR patch Place 25 mcg onto the skin every 3 (three) days.   Yes Historical Provider, MD  gabapentin (NEURONTIN) 300 MG capsule Place 300 mg into feeding tube 3  (three) times daily.  05/20/13  Yes Historical Provider, MD  hydrALAZINE (APRESOLINE) 25 MG tablet 25 mg by PEG Tube route 2 (two) times daily.    Yes Historical Provider, MD  insulin glargine (LANTUS) 100 UNIT/ML injection Inject 0.1 mLs (10 Units total) into the skin at bedtime. Patient taking differently: Inject 14 Units into the skin at bedtime.  10/21/14  Yes Florencia Reasons, MD  insulin lispro (HUMALOG KWIKPEN) 100 UNIT/ML KiwkPen Inject 9 Units into the skin 3 (three) times daily before meals.   Yes Historical Provider, MD  ipratropium-albuterol (DUONEB) 0.5-2.5 (3) MG/3ML SOLN Take 3 mLs by nebulization 4 (four) times daily.   Yes Historical Provider, MD  isosorbide dinitrate (ISORDIL) 10 MG tablet 10 mg by PEG Tube route 2 (two) times daily.    Yes Historical Provider, MD  lamoTRIgine (LAMICTAL) 25 MG tablet Take 50 mg by mouth daily.   Yes Historical Provider, MD  levETIRAcetam (KEPPRA) 100 MG/ML solution Place 5 mLs (500 mg total) into feeding tube 2 (two) times daily. 03/06/15  Yes Charlynne Cousins, MD  loperamide (IMODIUM A-D) 2 MG tablet Take 2 mg by mouth daily as needed for diarrhea or loose stools. Max 16 mg per day   Yes Historical Provider, MD  LORazepam (ATIVAN) 0.5 MG tablet Take 1 tablet (0.5 mg total) by mouth every 8 (eight) hours as needed. Anxiety 04/10/15  Yes Oswald Hillock, MD  LORazepam (ATIVAN) 2 MG/ML injection Inject 1 mg into the muscle every 6 (six) hours as needed for seizure.   Yes Historical Provider, MD  morphine (ROXANOL) 20 MG/ML concentrated solution Take 5 mg by mouth every hour as needed (air hunger).   Yes Historical Provider, MD  Multiple Vitamins-Minerals (MULTIVITAMIN WITH MINERALS) tablet Take 1 tablet by mouth daily.   Yes Historical Provider, MD  Nutritional Supplements (FEEDING SUPPLEMENT, GLUCERNA 1.5 CAL,) LIQD Place 60 mL/hr into feeding tube See admin instructions. 35ml/hr from 6p to 6a   Yes Historical Provider, MD  omeprazole (PRILOSEC) 20 MG capsule  Place 20 mg into feeding tube daily.    Yes Historical Provider, MD  ondansetron (ZOFRAN) 4 MG tablet Take 4 mg by mouth every 8 (eight) hours as  needed for nausea or vomiting.   Yes Historical Provider, MD  oxyCODONE-acetaminophen (PERCOCET) 7.5-325 MG tablet Take 1 tablet by mouth every 4 (four) hours as needed for severe pain. 04/10/15  Yes Oswald Hillock, MD  potassium & sodium phosphates (PHOS-NAK) 280-160-250 MG PACK Place 1 packet into feeding tube 4 (four) times daily.    Yes Historical Provider, MD  predniSONE (DELTASONE) 5 MG tablet Place 5 mg into feeding tube daily.  03/30/15  Yes Historical Provider, MD  Probiotic Product (PROBIOTIC PO) Take 1 capsule by mouth 2 (two) times daily.   Yes Historical Provider, MD  promethazine (PHENERGAN) 25 MG/ML injection Inject 25 mg into the vein every 6 (six) hours as needed for nausea or vomiting.   Yes Historical Provider, MD  senna-docusate (SENOKOT-S) 8.6-50 MG per tablet 1 tablet by PEG Tube route daily.    Yes Historical Provider, MD  sucralfate (CARAFATE) 1 GM/10ML suspension Place 1 g into feeding tube every 8 (eight) hours.    Yes Historical Provider, MD  tacrolimus (PROGRAF) 0.5 MG capsule Take 0.5 mg by mouth 2 (two) times daily.   Yes Historical Provider, MD  tacrolimus (PROGRAF) 1 MG capsule 3 mg by PEG Tube route 2 (two) times daily.    Yes Historical Provider, MD  tiZANidine (ZANAFLEX) 2 MG tablet Place 2 mg into feeding tube 2 (two) times daily.  05/26/13  Yes Historical Provider, MD  Vitamin D, Ergocalciferol, (DRISDOL) 50000 UNITS CAPS Place 50,000 Units into feeding tube every 30 (thirty) days.    Yes Historical Provider, MD  Water For Irrigation, Sterile (FREE WATER) SOLN Place 200 mLs into feeding tube every 8 (eight) hours. 03/06/15  Yes Charlynne Cousins, MD  Nutritional Supplements (FEEDING SUPPLEMENT, GLUCERNA 1.2 CAL,) LIQD Place 1,000 mLs into feeding tube continuous. Patient not taking: Reported on 04/05/2015 03/06/15   Charlynne Cousins, MD   Allergies  Allergen Reactions  . Amoxicillin Other (See Comments)    ON MAR  . Ampicillin Other (See Comments)    ON MAR  . Latex Other (See Comments)    ON MAR  . Morphine And Related Other (See Comments)    UNK  . Penicillins Other (See Comments)    ON MAR; tolerated Ceftriaxone 8/27-9/1  . Tape Other (See Comments)    ON MAR    Review of Systems  Unable to perform ROS: Other    Physical Exam  Constitutional: He appears well-nourished.  Respiratory:  trach  GI:  PEG; TF  Musculoskeletal:  Right sided weakness and now bedbound  Neurological: He is alert.  Pt's speech is repetitive, confused  Skin:  Multiple severe areas of breakdown to sacrum. Penis , scrotum, bilat LE. Some appear herpetic in nature. Wound care recommending ID    Vital Signs: BP 103/61 mmHg  Pulse 73  Temp(Src) 97.3 F (36.3 C) (Oral)  Resp 15  Ht 6' (1.829 m)  Wt 82.4 kg (181 lb 10.5 oz)  BMI 24.63 kg/m2  SpO2 97%  SpO2: SpO2: 97 % O2 Device:SpO2: 97 % O2 Flow Rate: .O2 Flow Rate (L/min): 2 L/min  IO: Intake/output summary:  Intake/Output Summary (Last 24 hours) at 06/19/15 1405 Last data filed at 06/19/15 1300  Gross per 24 hour  Intake 2503.67 ml  Output   1820 ml  Net 683.67 ml    LBM: Last BM Date: 06/19/15 Baseline Weight: Weight: 80.8 kg (178 lb 2.1 oz) Most recent weight: Weight: 82.4 kg (181 lb 10.5 oz)  Palliative Assessment/Data:  Flowsheet Rows        Most Recent Value   Intake Tab    Referral Department  Hospitalist   Unit at Time of Referral  Intermediate Care Unit   Palliative Care Primary Diagnosis  Sepsis/Infectious Disease   Date Notified  06/18/15   Palliative Care Type  Return patient Palliative Care   Reason for referral  Clarify Goals of Care   Date of Admission  06/08/15   Date first seen by Palliative Care  06/19/15   # of days Palliative referral response time  1 Day(s)   # of days IP prior to Palliative referral  10    Clinical Assessment    Palliative Performance Scale Score  20%   Pain Max last 24 hours  0   Pain Min Last 24 hours  0   Dyspnea Max Last 24 Hours  0   Dyspnea Min Last 24 hours  0   Nausea Max Last 24 Hours  0   Nausea Min Last 24 Hours  0   Anxiety Max Last 24 Hours  0   Anxiety Min Last 24 Hours  0   Other Max Last 24 Hours  0   Psychosocial & Spiritual Assessment    Palliative Care Outcomes    Patient/Family meeting held?  No   Palliative Care follow-up planned  Yes, Facility      Additional Data Reviewed:  CBC:    Component Value Date/Time   WBC 7.5 06/19/2015 0138   HGB 12.1* 06/19/2015 0138   HCT 40.6 06/19/2015 0138   PLT 192 06/19/2015 0138   MCV 88.3 06/19/2015 0138   NEUTROABS 5.6 06/19/2015 0138   LYMPHSABS 1.6 06/19/2015 0138   MONOABS 0.2 06/19/2015 0138   EOSABS 0.0 06/19/2015 0138   BASOSABS 0.0 06/19/2015 0138   Comprehensive Metabolic Panel:    Component Value Date/Time   NA 138 06/19/2015 0138   K 4.4 06/19/2015 0138   CL 105 06/19/2015 0138   CO2 28 06/19/2015 0138   BUN 28* 06/19/2015 0138   CREATININE 1.15 06/19/2015 0138   GLUCOSE 207* 06/19/2015 0138   CALCIUM 9.7 06/19/2015 0138   CALCIUM 7.9* 08/24/2007 1202   AST 20 06/19/2015 0138   ALT 20 06/19/2015 0138   ALKPHOS 78 06/19/2015 0138   BILITOT 0.5 06/19/2015 0138   PROT 6.5 06/19/2015 0138   ALBUMIN 1.9* 06/19/2015 0138     Time In: 1000 Time Out: 1050 Time Total: 50 min Greater than 50%  of this time was spent counseling and coordinating care related to the above assessment and plan. Staffed with Dr. Sherral Hammers  Signed by: Dory Horn, NP  Dory Horn, NP  06/19/2015, 2:05 PM  Please contact Palliative Medicine Team phone at [phone] for questions and concerns.

## 2015-06-19 NOTE — Progress Notes (Signed)
[place] TEAM 1 - Stepdown/ICU TEAM Progress Note  Justin [name] [id number] DOB: 1956/03/12 DOA: 06/08/2015 PCP: Garwin Brothers, MD  Admit HPI / Brief Narrative: 59 y.o. 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 He was brought to [facility] evening of 12/5 after staff at SNF found him agonally breathing since 3 PM.  In ED, he was completely unresponsive and agonal. He subsequently required intubation for airway protection. During intubation, a piece of tissue paper was removed from pt's posterior pharynx.  He had recent admission 04/05/15 through 04/10/15 for urosepsis.  HPI/Subjective: 12/16, alert, will track you with his eyes when you call his name. Appears to respond to some commands (squeezes your fingers with left hand/wiggle his left toes). Positive perseveration (repeat I'm fine).    Assessment/Plan: Acute hypoxic respiratory failure: - Secondary to healthcare associated pneumonia/aspiration pneumonia, Resolved. Status post extubation 12/13.  Acute encephalopathy:  -Improving. Per RN; daughter stated patient is at baseline.  -Known Hx  Pseudobulbar affect. Baseline mental status is poor.   Gram-positive cocci bacteremia (2/2 positive coag negative staph) -Continue vancomycin; 2 weeks worth of treatment? -Repeat blood cultures pending  Escherichia coli & Klebsiella pneumonia: - Likely secondary to aspiration. Status post treatment.  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  Sacral decubitus ulcer/scrotal ulcers/bilateral lower extremity ulcers -Wound care was consulted on 12/6 and recommendations were that the area of infection/wounds was so severe it was beyond their scope of practice and recommended ID consult.   Seizure:  -Occurred on 12/6 while on imipenem. Known h/o ICH. Reported prior history of "convulsions". No seizure activity since.  -EEG without focal activity that would suggest seizure.  -Continuing Gabapentin 100 mg via tube q8hr  -Continue  Keppra 500 mg via tube BID  -Versed IV PRN seizure activity.  -Seizure precautions.  Acute on chronic renal failure: S/P Renal transplant -Resolving. Likely secondary to previous diuresis.  -Strict I and O's since admission +15 L -Daily weight admission weight = 80.8 kg                         12/16 weight= 82.4 kg -Continue Prograf -Prograf level = 4.7 WNL -Continue prednisone  Funguria:  -Patient had no Foley catheter on admission. Treated with 2 doses of Diflucan on 12/8 & 12/9. -Repeat urine culture pending  HTN -Hydralazine 25 mg BID -Holding amlodipine & Isordil.  -Elevated troponin I: - Likely demand ischemia, will continue to trend.  -Continuing aspirin 81 mg daily  -Continue Lipitor 10 mg daily. -Lipid panel pending  Prolonged QTc:  -EKG on 12/13 showed 471 ms. Continuing to monitor the patient on telemetry.  Depression/Anxiety:  -Continuing Celexa 20 mg daily  -Continue Lamictal 50 mg daily.  Anemia -Negative sign of active  bleeding -Most likely secondary to chronic disease obtain anemia panel  ICH:  -Residual right hemiparesis  -CT scan from 12/5. Negative acute  findings  B-cell lymphoma:  -S/P XRT. Continuing to monitor cell counts with CBC daily.  Diet:  -Possibly causing patient's chronic diarrhea although could have infectious component. See chronic diarrhea  -Continue tube feeds via PEG. Continuing free water flushes every 6 hours. Probiotic via tube twice a day.    Goals of care -CODE STATUS: Prior discussions with family members by Dr. Chase Caller with daughters wishing the patient to remain full code. -Placed palliative care consult considering patient's poor quality of life, multiple drug-resistant infections, and baseline mental status patient will continue to do poorly. Palliative care has set up meeting for Saturday 12/17 at 1300 to discuss short-term vs long-term goals of care.     Code Status: FULL Family Communication: no family present at time of exam Disposition Plan: Will await goals of care meeting with family    Consultants: Dr.Jennings E Nestor PCCM [place] [facility] hospitalist    Procedure/Significant Events: TTE 10/3: Mild LVH w/ EF 55-60%. Normal regional wall motion. Grade 1 diastolic dysfunction. RV normal in size & function. CT HEAD 12/5: No acute intracranial pathology. Chronic encephalomalacia of left cerebral hemisphere. Moderate cortical volume loss with small vessel ischemic microangiopathy. EEG 12/6: Severe generalized slowing on Fentanyl. Pattern consistent w/ hypoxic injury or toxic encephalopathy. No evidence of seizure activity. Port CXR 12/11: Lordotic view. ETT 4cm above carina. Left IJ w/ tip in SVC. No opacity. EKG 12/13: QTc 485ms. Normal sinus rhythm.   Culture 12/6 MRSA by PCR negative 12/6:UrinePositive Yeast 100K 12/6 tracheal aspirate positive Klebsiella pneumonia(sensitive Cipro/Gent/Imipenem/Zosyn/Tobra)/Escherichia coli (sensitive Gent/Imipenem/Tobra/Zosyn/Bactrim) 12/11 blood left hand 2 positive coag negative staph 12/15 positive fecal lactoferrin 12/15 HIV  pending 12/15 GI panel pending 12/16 blood 2 pending 12/16 urine pending 12/16 HSV 1& 2 pending   Antibiotics: Vanc 12/5 - 12/8; 12/12>>>  Aztreonam 12/5 - 12/6  Imipenem 12/7 Merrem (change due to [place]) 12/7 - 12/10 Diflucan 12/8 (yeast in Urine) - 12/09   DVT prophylaxis: SCD, subcutaneous heparin   Devices    LINES / TUBES:  L IJ CVL 12/7>>> FOLEY 12/6>>> PEG>>> OGT 12/5 - 12/13 OETT 7.5 12/5 - 12/13    Continuous Infusions: . sodium chloride 10 mL/hr at 06/19/15 0900  . feeding supplement (GLUCERNA 1.2 CAL) 1,000 mL (06/19/15 0900)    Objective: VITAL SIGNS: Temp: 97.7 F (36.5 C) (12/16 0759) Temp Source: Oral (12/16 0759) BP: 124/68 mmHg (12/16 0927) Pulse Rate: 76 (12/16 0759) SPO2; FIO2:   Intake/Output Summary (Last 24 hours) at 06/19/15 1225 Last data filed at 06/19/15 [id number]  Gross per 24 hour  Intake 2223.67 ml  Output   1990 ml  Net 233.67 ml     Exam: General: Alert, will track you with his eyes when you call his name. Appears to respond to some commands (squeezes your fingers with left hand/wiggle his left toes).  No acute respiratory distress Eyes: negative scleral hemorrhage ENT: Negative Runny nose, negative gingival bleeding, Neck:  Negative scars, masses, torticollis, lymphadenopathy, JVD Lungs: Clear to auscultation bilaterally without wheezes or crackles Cardiovascular: Regular rate and rhythm without murmur gallop or rub normal S1 and S2 Abdomen:negative abdominal pain, nondistended, positive soft, bowel sounds, no rebound, no ascites, no appreciable mass, PEG tube in place negative sign of infection Genitourinary; warts on penis." Pt has chronic condition with multiple red raised vesicles, some are dry crusted and  yellow. Patchy areas of full thickness tissue loss, mod amt tan drainage with odor. Shaft of penis is red and macerated. Appearance is consistent with probable genital herpes. Multiple stage II ulcers on shaft of  penis, scrotum. Stage II decubitus ulcer  Extremities: No significant cyanosis, clubbing. Multiple bilateral lower extremity ulcers Psychiatric:   unable to assess secondary to altered mental status (supposedly baseline)  Neurologic:  Cranial nerves II through XII intact, tongue/uvula midline,  right hemiparesis. Spontaneously moves [place]. Unable to assess further secondary to altered mental status.    Data Reviewed: Basic Metabolic Panel:  Recent Labs Lab 06/13/15 0400  06/15/15 0325 06/16/15 0500 06/17/15 0555 06/18/15 0442 06/18/15 0443 06/19/15 0138  NA 147*  < > 148* 146* 143  --  141 138  K 4.5  < > 4.1 3.8 4.3  --  3.9 4.4  CL 116*  < > 118* 114* 111  --  107 105  CO2 24  < > 23 26 28   --  28 28  GLUCOSE 296*  < > 374* 145* 221*  --  153* 207*  BUN 30*  < > 50* 43* 35*  --  31* 28*  CREATININE 1.84*  < > 2.33* 1.69* 1.44*  --  1.28* 1.15  CALCIUM 10.0  < > 9.7 9.7 9.9  --  9.7 9.7  MG 2.1  --   --  2.4 2.3 2.2  --  2.3  PHOS 3.1  --   --  2.3* 2.3*  --  2.2*  --   < > = values in this interval not displayed. Liver Function Tests:  Recent Labs Lab 06/16/15 0500 06/17/15 0555 06/18/15 0443 06/19/15 0138  AST  --   --   --  20  ALT  --   --   --  20  ALKPHOS  --   --   --  78  BILITOT  --   --   --  0.5  PROT  --   --   --  6.5  ALBUMIN 2.0* 2.0* 1.9* 1.9*   No results for input(s): LIPASE, AMYLASE in the last 168 hours. No results for input(s): AMMONIA in the last 168 hours. CBC:  Recent Labs Lab 06/15/15 0325 06/16/15 0500 06/17/15 0555 06/18/15 0442 06/19/15 0138  WBC 7.7 7.1 7.4 7.3 7.5  NEUTROABS 4.7 5.1 5.6 5.3 5.6  HGB 12.7* 11.6* 12.0* 11.8* 12.1*  HCT 43.3 39.4 41.4 39.9 40.6  MCV 90.2 89.7 90.2 89.5 88.3  PLT 152 139* 157 176 192   Cardiac Enzymes:  Recent Labs Lab 06/13/15 0400 06/18/15 2222 06/19/15 0138 06/19/15 0728  TROPONINI 0.09* 0.04* 0.08* 0.05*   BNP (last 3 results) No results for input(s): BNP in the last 8760  hours.  ProBNP (last 3 results) No results for input(s): PROBNP in the last 8760 hours.  CBG:  Recent Labs Lab 06/18/15 1605 06/18/15 2012 06/18/15 2341 06/19/15 0459 06/19/15 0753  GLUCAP 185* 169* 188* 167* 132*    Recent Results (from the past 240 hour(s))  Culture, respiratory (tracheal aspirate)     Status: None   Collection Time: 06/09/15  4:35 PM  Result Value Ref Range Status   Specimen Description TRACHEAL ASPIRATE  Final   Special Requests NONE  Final   Gram Stain   Final    ABUNDANT WBC PRESENT, PREDOMINANTLY PMN NO SQUAMOUS EPITHELIAL CELLS SEEN ABUNDANT GRAM NEGATIVE RODS FEW GRAM POSITIVE COCCI IN PAIRS FEW YEAST    Culture  Final    ABUNDANT KLEBSIELLA PNEUMONIAE Note: Confirmed Extended Spectrum Beta-Lactamase Producer (ESBL) CRITICAL RESULT CALLED TO, READ BACK BY AND VERIFIED WITH: L VERNON RN 130PM 06/13/15 GUSTK ABUNDANT ESCHERICHIA COLI Note: Confirmed Extended Spectrum Beta-Lactamase Producer (ESBL) Performed at [facility]    Report Status 06/13/2015 FINAL  Final   Organism ID, Bacteria KLEBSIELLA PNEUMONIAE  Final   Organism ID, Bacteria ESCHERICHIA COLI  Final      Susceptibility   Escherichia coli - MIC*    AMPICILLIN >=32 RESISTANT Resistant     AMPICILLIN/SULBACTAM 16 INTERMEDIATE Intermediate     CEFAZOLIN >=64 RESISTANT Resistant     CEFEPIME RESISTANT      CEFTAZIDIME RESISTANT      CEFTRIAXONE >=64 RESISTANT Resistant     CIPROFLOXACIN >=4 RESISTANT Resistant     GENTAMICIN <=1 SENSITIVE Sensitive     IMIPENEM <=0.25 SENSITIVE Sensitive     PIP/TAZO <=4 SENSITIVE Sensitive     TOBRAMYCIN <=1 SENSITIVE Sensitive     TRIMETH/SULFA Value in next row Sensitive      <=20 SENSITIVE(NOTE)    * ABUNDANT ESCHERICHIA COLI   Klebsiella pneumoniae - MIC*    AMPICILLIN Value in next row Resistant      <=20 SENSITIVE(NOTE)    AMPICILLIN/SULBACTAM Value in next row Resistant      <=20 SENSITIVE(NOTE)    CEFAZOLIN Value in next  row Resistant      <=20 SENSITIVE(NOTE)    CEFEPIME Value in next row Resistant      <=20 SENSITIVE(NOTE)    CEFTAZIDIME Value in next row Resistant      <=20 SENSITIVE(NOTE)    CEFTRIAXONE Value in next row Resistant      <=20 SENSITIVE(NOTE)    CIPROFLOXACIN Value in next row Sensitive      <=20 SENSITIVE(NOTE)    GENTAMICIN Value in next row Sensitive      <=20 SENSITIVE(NOTE)    IMIPENEM Value in next row Sensitive      <=20 SENSITIVE(NOTE)    PIP/TAZO Value in next row Sensitive      <=20 SENSITIVE(NOTE)    TOBRAMYCIN Value in next row Sensitive      <=20 SENSITIVE(NOTE)    TRIMETH/SULFA Value in next row Resistant      >=320 RESISTANT(NOTE)    * ABUNDANT KLEBSIELLA PNEUMONIAE  Culture, blood (routine x 2)     Status: None   Collection Time: 06/14/15  3:25 PM  Result Value Ref Range Status   Specimen Description BLOOD LEFT HAND  Final   Special Requests IN PEDIATRIC BOTTLE 3CC  Final   Culture  Setup Time   Final    GRAM POSITIVE COCCI IN CLUSTERS AEROBIC BOTTLE ONLY CRITICAL RESULT CALLED TO, READ BACK BY AND VERIFIED WITH: M MILLS 06/15/15 @ 0936 M VESTAL    Culture   Final    STAPHYLOCOCCUS SPECIES (COAGULASE NEGATIVE) SUSCEPTIBILITIES PERFORMED ON PREVIOUS CULTURE WITHIN THE LAST 5 DAYS.    Report Status 06/18/2015 FINAL  Final  Culture, blood (routine x 2)     Status: None   Collection Time: 06/14/15  4:44 PM  Result Value Ref Range Status   Specimen Description BLOOD LEFT HAND  Final   Special Requests IN PEDIATRIC BOTTLE 3CC  Final   Culture  Setup Time   Final    GRAM POSITIVE COCCI IN CLUSTERS AEROBIC BOTTLE ONLY CRITICAL RESULT CALLED TO, READ BACK BY AND VERIFIED WITH: R SARINE RN @2227  06/15/15 MKELLY    Culture STAPHYLOCOCCUS  SPECIES (COAGULASE NEGATIVE)  Final   Report Status 06/18/2015 FINAL  Final   Organism ID, Bacteria STAPHYLOCOCCUS SPECIES (COAGULASE NEGATIVE)  Final      Susceptibility   Staphylococcus species (coagulase negative) - MIC*     CIPROFLOXACIN >=8 RESISTANT Resistant     ERYTHROMYCIN >=8 RESISTANT Resistant     GENTAMICIN <=0.5 SENSITIVE Sensitive     OXACILLIN >=4 RESISTANT Resistant     TETRACYCLINE <=1 SENSITIVE Sensitive     VANCOMYCIN <=0.5 SENSITIVE Sensitive     TRIMETH/SULFA <=10 SENSITIVE Sensitive     CLINDAMYCIN >=8 RESISTANT Resistant     RIFAMPIN <=0.5 SENSITIVE Sensitive     Inducible Clindamycin NEGATIVE Sensitive     * STAPHYLOCOCCUS SPECIES (COAGULASE NEGATIVE)     Studies:  Recent x-ray studies have been reviewed in detail by the Attending Physician  Scheduled Meds:  Scheduled Meds: . antiseptic oral rinse  7 mL Mouth Rinse BID  . aspirin  81 mg Per Tube Daily  . atorvastatin  10 mg Per Tube q1800  . citalopram  20 mg Per Tube Daily  . feeding supplement (PRO-STAT SUGAR FREE 64)  30 mL Per Tube BID  . free water  200 mL Per Tube 4 times per day  . gabapentin  100 mg Per Tube 3 times per day  . Gerhardt's butt cream   Topical BID  . heparin  5,000 Units Subcutaneous 3 times per day  . hydrALAZINE  25 mg Per Tube Q12H  . insulin aspart  0-15 Units Subcutaneous 6 times per day  . insulin NPH Human  15 Units Subcutaneous Q6H  . BIOGAIA PROBIOTIC  5 mL Per Tube BID  . lamoTRIgine  50 mg Per Tube Daily  . levETIRAcetam  500 mg Per Tube BID  . pantoprazole sodium  40 mg Per Tube Daily  . predniSONE  5 mg Per Tube Daily  . sodium chloride  10-40 mL Intracatheter Q12H  . sucralfate  1 g Per Tube Q8H  . tacrolimus  3.5 mg Per Tube BID  . vancomycin  750 mg Intravenous Q12H    Time spent on care of this patient: 40 mins   Jefferie Holston, Geraldo Docker , MD  [facility] Office  [phone] Pager - [phone]  On-Call/Text Page:      [email or web address]      password [email or web address]  If 7PM-7AM, please contact night-coverage [email or web address] Password [email or web address] 06/19/2015, 12:25 PM   LOS: 11 days   Care during the described time interval was provided by me .  I have reviewed this patient's available data,  including medical history, events of note, physical examination, and all test results as part of my evaluation. I have personally reviewed and interpreted all radiology studies.   Dia Crawford, MD [phone] Pager

## 2015-06-20 DIAGNOSIS — L89159 Pressure ulcer of sacral region, unspecified stage: Secondary | ICD-10-CM

## 2015-06-20 LAB — CBC WITH DIFFERENTIAL/PLATELET
BASOS ABS: 0 10*3/uL (ref 0.0–0.1)
Basophils Relative: 0 %
EOS PCT: 2 %
Eosinophils Absolute: 0.1 10*3/uL (ref 0.0–0.7)
HEMATOCRIT: 41.2 % (ref 39.0–52.0)
HEMOGLOBIN: 12.5 g/dL — AB (ref 13.0–17.0)
LYMPHS PCT: 24 %
Lymphs Abs: 1.8 10*3/uL (ref 0.7–4.0)
MCH: 26.9 pg (ref 26.0–34.0)
MCHC: 30.3 g/dL (ref 30.0–36.0)
MCV: 88.8 fL (ref 78.0–100.0)
Monocytes Absolute: 0.2 10*3/uL (ref 0.1–1.0)
Monocytes Relative: 3 %
NEUTROS ABS: 5.4 10*3/uL (ref 1.7–7.7)
NEUTROS PCT: 71 %
PLATELETS: 214 10*3/uL (ref 150–400)
RBC: 4.64 MIL/uL (ref 4.22–5.81)
RDW: 14.2 % (ref 11.5–15.5)
WBC: 7.6 10*3/uL (ref 4.0–10.5)

## 2015-06-20 LAB — COMPREHENSIVE METABOLIC PANEL
ALT: 17 U/L (ref 17–63)
ANION GAP: 4 — AB (ref 5–15)
AST: 23 U/L (ref 15–41)
Albumin: 2 g/dL — ABNORMAL LOW (ref 3.5–5.0)
Alkaline Phosphatase: 69 U/L (ref 38–126)
BUN: 27 mg/dL — ABNORMAL HIGH (ref 6–20)
CHLORIDE: 103 mmol/L (ref 101–111)
CO2: 32 mmol/L (ref 22–32)
Calcium: 10.2 mg/dL (ref 8.9–10.3)
Creatinine, Ser: 1.25 mg/dL — ABNORMAL HIGH (ref 0.61–1.24)
Glucose, Bld: 133 mg/dL — ABNORMAL HIGH (ref 65–99)
POTASSIUM: 4.7 mmol/L (ref 3.5–5.1)
SODIUM: 139 mmol/L (ref 135–145)
Total Bilirubin: 0.6 mg/dL (ref 0.3–1.2)
Total Protein: 6.7 g/dL (ref 6.5–8.1)

## 2015-06-20 LAB — VANCOMYCIN, TROUGH: Vancomycin Tr: 30 ug/mL (ref 10.0–20.0)

## 2015-06-20 LAB — URINE CULTURE: Culture: >=100000

## 2015-06-20 LAB — GLUCOSE, CAPILLARY
GLUCOSE-CAPILLARY: 115 mg/dL — AB (ref 65–99)
GLUCOSE-CAPILLARY: 130 mg/dL — AB (ref 65–99)
GLUCOSE-CAPILLARY: 137 mg/dL — AB (ref 65–99)
GLUCOSE-CAPILLARY: 140 mg/dL — AB (ref 65–99)
GLUCOSE-CAPILLARY: 198 mg/dL — AB (ref 65–99)

## 2015-06-20 LAB — MAGNESIUM: MAGNESIUM: 2.5 mg/dL — AB (ref 1.7–2.4)

## 2015-06-20 LAB — HEMOGLOBIN A1C
HEMOGLOBIN A1C: 11.2 % — AB (ref 4.8–5.6)
MEAN PLASMA GLUCOSE: 275 mg/dL

## 2015-06-20 MED ORDER — VANCOMYCIN HCL IN DEXTROSE 1-5 GM/200ML-% IV SOLN: 1000.0000 mg | INTRAVENOUS | Status: DC

## 2015-06-20 MED ORDER — FLUCONAZOLE 40 MG/ML PO SUSR
200.0000 mg | Freq: Once | ORAL | Status: AC
  Administered 2015-06-20: 200 mg
  Filled 2015-06-20: qty 5

## 2015-06-20 MED ORDER — FLUCONAZOLE 40 MG/ML PO SUSR
100.0000 mg | Freq: Every day | ORAL | Status: DC
  Administered 2015-06-21 – 2015-06-23 (×3): 100 mg
  Filled 2015-06-20 (×3): qty 2.5

## 2015-06-20 MED ORDER — LOPERAMIDE HCL 2 MG PO CAPS
4.0000 mg | ORAL_CAPSULE | Freq: Four times a day (QID) | ORAL | Status: DC
  Administered 2015-06-20 – 2015-06-22 (×10): 4 mg via ORAL
  Filled 2015-06-20 (×10): qty 2

## 2015-06-20 NOTE — Progress Notes (Signed)
Family did not return call to verify 1300 family meeting for [place]. Went to unit at 1300 but they were not there. Will continue and try to arrange meeting with daughter's April and Faith. Thank you, Romona Curls, ANP

## 2015-06-20 NOTE — Progress Notes (Signed)
Pharmacy Antibiotic Follow-up Note  Justin [name] is a 59 y.o. year-old male admitted on 06/08/2015.  The patient is currently on day 3 of Vancomycin for Coag neg staph bacteremia.  Assessment/Plan: A vancomycin trough drawn today was elevated at 30. Pt already received his dose after the trough was drawn prior to the level being evaluated. Will DC current vancomycin dose and check a random level tomorrow morning.   Temp (24hrs), Avg:97.6 F (36.4 C), Min:96.8 F (36 C), Max:98.3 F (36.8 C)   Recent Labs Lab 06/16/15 0500 06/17/15 0555 06/18/15 0442 06/19/15 0138 06/20/15 0604  WBC 7.1 7.4 7.3 7.5 7.6     Recent Labs Lab 06/16/15 0500 06/17/15 0555 06/18/15 0443 06/19/15 0138 06/20/15 0604  CREATININE 1.69* 1.44* 1.28* 1.15 1.25*   Estimated Creatinine Clearance: 68.1 mL/min (by C-G formula based on Cr of 1.25).    Allergies  Allergen Reactions  . Amoxicillin Other (See Comments)    ON MAR  . Ampicillin Other (See Comments)    ON MAR  . Latex Other (See Comments)    ON MAR  . Morphine And Related Other (See Comments)    UNK  . Penicillins Other (See Comments)    ON MAR; tolerated Ceftriaxone 8/27-9/1  . Tape Other (See Comments)    ON MAR    Antimicrobials this admission: Fluconazole 12/8 >>12/10 Vanc 12/5 > 12/7,  Restart 12/12>> Aztreonam 12/5 > 12/6 Primax 12/6 > 12/7 Meropenem 12/7 >12/10  Levels/dose changes this admission: VT 12/17 = 30 (drawn appropriately per RN)  Microbiology results: 12/5 blood x 2 >> 1/2 CONS 12/6 urine- yeast > 100K 12/6 MRSA neg 12/6 TA >> ecoli esbl (S-gent, imipenem, zosyn, bactrim,tobra), kleb pneumo (S-cipro,gent,imi,zosyn,tobra) 12/11 BCx2>> 2/2 CoNS   Thank you for allowing pharmacy to be a part of this patient's care.  Salome Arnt, PharmD, BCPS Pager # [phone] 06/20/2015 11:39 AM

## 2015-06-20 NOTE — Progress Notes (Signed)
[place] TEAM 1 - Stepdown/ICU TEAM Progress Note  Justin [name] [id number] DOB: 05/20/1956 DOA: 06/08/2015 PCP: Garwin Brothers, MD  Admit HPI / Brief Narrative: 59 y.o. 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 He was brought to [facility] evening of 12/5 after staff at SNF found him agonally breathing since 3 PM.  In ED, he was completely unresponsive and agonal. He subsequently required intubation for airway protection. During intubation, a piece of tissue paper was removed from pt's posterior pharynx.  He had recent admission 04/05/15 through 04/10/15 for urosepsis.  HPI/Subjective: 12/17, alert, will track you with his eyes when you call his name. Respond to some commands (squeezes your fingers with left hand/wiggle his left toes). Positive perseveration (repeat I'm fine).    Assessment/Plan: Acute hypoxic respiratory failure: - Secondary to healthcare associated pneumonia/aspiration pneumonia, Resolved. Status post extubation 12/13.  Acute encephalopathy:  -Improving. Per RN; daughter stated patient is at baseline.  -Known Hx  Pseudobulbar affect. Baseline mental status is poor.   Gram-positive cocci bacteremia (2/2 positive coag negative staph) -Continue vancomycin; 2 weeks worth of treatment? -Repeat blood cultures NGTD  Escherichia coli & Klebsiella pneumonia: - Likely secondary to aspiration. Status post treatment.  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 Will start patient on loperamide 4 mg QID -Stool electrolytes pending -Stool osmolality pending -Fecal fat pending   Diabetes mellitus Type 2 uncontrolled:  -12/15 hemoglobin A1c = 11.2  -Continuing NPH 15 units subcutaneous every 6 hours -Continue moderate SSI.  Sacral decubitus ulcer/scrotal ulcers/bilateral lower extremity ulcers -Wound care was consulted on 12/6 and recommendations were that the area of infection/wounds was so severe it was beyond their scope of practice and recommended ID consult.   Seizure:  -Occurred on 12/6 while on imipenem. Known h/o ICH. Reported prior history of "convulsions". No seizure activity since.  -EEG without focal activity that would suggest seizure.  -Continuing Gabapentin 100 mg via tube q8hr  -Continue  Keppra 500 mg via tube BID  -Versed IV PRN seizure activity.  -Seizure precautions.  Acute on chronic renal failure: S/P Renal transplant -Resolving. Likely secondary to previous diuresis.  -Strict I and O's since admission + 13.2 L -Daily weight admission weight = 80.8 kg                         12/17 weight= 84.8 kg (accuracy?) -Continue Prograf -Prograf level = 4.7 WNL -Continue prednisone  Funguria:  -Patient had no Foley catheter on admission. Treated with 2 doses of Diflucan on 12/8 & 12/9. -Repeat urine culture positive for yeast. Review of chart and discussion with pharmacist reveals that not initially completely treated. Will restart Diflucan and treat for 10 day course -Diflucan 200 mg 1 then 100 mg daily  HTN -Hydralazine 25 mg BID -Holding amlodipine & Isordil.  -Elevated troponin I: - Likely demand ischemia, will continue to trend.  -Continuing aspirin 81 mg daily  -Continue Lipitor  10 mg daily. -Lipid panel pending  Prolonged QTc:  -EKG on 12/13 showed 471 ms. Continuing to monitor the patient on  telemetry.  Depression/Anxiety:  -Continuing Celexa 20 mg daily  -Continue Lamictal 50 mg daily.  Anemia -Negative sign of active bleeding -Most likely secondary to chronic disease obtain anemia panel  ICH:  -Residual right hemiparesis  -CT scan from 12/5. Negative acute findings  B-cell lymphoma:  -S/P XRT. Continuing to monitor cell counts with CBC daily.  Diet:  -Possibly causing patient's chronic diarrhea although could have infectious component. See chronic diarrhea  -Continue tube feeds via PEG. Continuing free water flushes every 6 hours. Probiotic via tube twice a day.    Goals of care -CODE STATUS: Prior discussions with family members by Dr. Chase Caller with daughters wishing the patient to remain full code. -Placed palliative care consult considering patient's poor quality of life, multiple drug-resistant infections, and baseline mental status patient will continue to do poorly. Palliative care set up meeting for Saturday 12/17 at [place].     Code Status: FULL Family Communication: no family present at time of exam Disposition Plan: Will await goals of care meeting with family    Consultants: Dr.Jennings E Nestor PCC[facility] hospitalist    Procedure/Significant Events: TTE 10/3: Mild LVH w/ EF 55-60%. Normal regional wall motion. Grade 1 diastolic dysfunction. RV normal in size & function. CT HEAD 12/5: No acute intracranial pathology. Chronic encephalomalacia of left cerebral hemisphere. Moderate cortical volume loss with small vessel ischemic microangiopathy. EEG 12/6: Severe generalized slowing on Fentanyl. Pattern consistent w/ hypoxic injury or toxic encephalopathy. No evidence of seizure activity. Port CXR 12/11: Lordotic view. ETT 4cm above carina. Left IJ w/ tip in SVC. No opacity. EKG 12/13: QTc 486ms. Normal sinus rhythm.   Culture 12/6 MRSA by PCR negative 12/6:UrinePositive Yeast 100K 12/6 tracheal aspirate  positive Klebsiella pneumonia(sensitive Cipro/Gent/Imipenem/Zosyn/Tobra)/Escherichia coli (sensitive Gent/Imipenem/Tobra/Zosyn/Bactrim) 12/11 blood left hand 2 positive coag negative staph 12/15 positive fecal lactoferrin 12/15 HIV negative 12/15 GI panel negative 12/16 blood left hand 2 NGTD 12/16 urine positive yeast 12/16 HSV 1& 2 pending   Antibiotics: Vanc 12/5 - 12/8; 12/12>>>  Aztreonam 12/5 - 12/6  Imipenem 12/7 Merrem (change due to [place]) 12/7 - 12/10 Diflucan 12/8 (yeast in Urine) - 12/09; 12/17>>   DVT prophylaxis: SCD, subcutaneous heparin   Devices    LINES / TUBES:  L IJ CVL 12/7>>> FOLEY 12/6>>> PEG>>> OGT 12/5 - 12/13 OETT 7.5 12/5 - 12/13    Continuous Infusions: . sodium chloride 500 mL (06/19/15 2200)  . feeding supplement (GLUCERNA 1.2 CAL) 1,000 mL (06/20/15 0153)    Objective: VITAL SIGNS: Temp: 98.3 F (36.8 C) (12/17 0420) Temp Source: Axillary (12/17 0420) BP: 161/90 mmHg (12/17 0420) Pulse Rate: 82 (12/17 0420) SPO2; FIO2:   Intake/Output Summary (Last 24 hours) at 06/20/15 0804 Last data filed at 06/20/15 [id number]  Gross per 24 hour  Intake 2935.17 ml  Output   2485 ml  Net 450.17 ml     Exam: General: Alert, will track you with his eyes when you call his name. Respond to some commands (squeezes your fingers with left hand/wiggle his left toes).  No acute respiratory distress Eyes: negative scleral hemorrhage ENT: Negative Runny nose, negative gingival bleeding, Neck:  Negative scars, masses, torticollis, lymphadenopathy, JVD Lungs: Clear to auscultation bilaterally without wheezes or crackles Cardiovascular: Regular rate and rhythm without murmur gallop or rub normal S1 and S2 Abdomen:negative abdominal pain, nondistended,  positive soft, bowel sounds, no rebound, no ascites, no appreciable mass, PEG tube in place negative sign of infection Genitourinary; warts on penis." Pt has chronic condition with multiple red raised  vesicles, some are dry crusted and yellow. Patchy areas of full thickness tissue loss, mod amt tan drainage with odor. Shaft of penis is red and macerated. Appearance is consistent with probable genital herpes. Multiple stage II ulcers on shaft of penis, scrotum. Stage II decubitus ulcer  Extremities: No significant cyanosis, clubbing. Multiple bilateral lower extremity ulcers Psychiatric:   unable to assess secondary to altered mental status (supposedly baseline)  Neurologic:  Cranial nerves II through XII intact, tongue/uvula midline,  right hemiparesis. Spontaneously moves [place]. Unable to assess further secondary to altered mental status.    Data Reviewed: Basic Metabolic Panel:  Recent Labs Lab 06/16/15 0500 06/17/15 0555 06/18/15 0442 06/18/15 0443 06/19/15 0138 06/20/15 0604  NA 146* 143  --  141 138 139  K 3.8 4.3  --  3.9 4.4 4.7  CL 114* 111  --  107 105 103  CO2 26 28  --  28 28 32  GLUCOSE 145* 221*  --  153* 207* 133*  BUN 43* 35*  --  31* 28* 27*  CREATININE 1.69* 1.44*  --  1.28* 1.15 1.25*  CALCIUM 9.7 9.9  --  9.7 9.7 10.2  MG 2.4 2.3 2.2  --  2.3 2.5*  PHOS 2.3* 2.3*  --  2.2*  --   --    Liver Function Tests:  Recent Labs Lab 06/16/15 0500 06/17/15 0555 06/18/15 0443 06/19/15 0138 06/20/15 0604  AST  --   --   --  20 23  ALT  --   --   --  20 17  ALKPHOS  --   --   --  78 69  BILITOT  --   --   --  0.5 0.6  PROT  --   --   --  6.5 6.7  ALBUMIN 2.0* 2.0* 1.9* 1.9* 2.0*   No results for input(s): LIPASE, AMYLASE in the last 168 hours. No results for input(s): AMMONIA in the last 168 hours. CBC:  Recent Labs Lab 06/16/15 0500 06/17/15 0555 06/18/15 0442 06/19/15 0138 06/20/15 0604  WBC 7.1 7.4 7.3 7.5 7.6  NEUTROABS 5.1 5.6 5.3 5.6 5.4  HGB 11.6* 12.0* 11.8* 12.1* 12.5*  HCT 39.4 41.4 39.9 40.6 41.2  MCV 89.7 90.2 89.5 88.3 88.8  PLT 139* 157 176 192 214   Cardiac Enzymes:  Recent Labs Lab 06/18/15 2222 06/19/15 0138 06/19/15 0728   TROPONINI 0.04* 0.08* 0.05*   BNP (last 3 results) No results for input(s): BNP in the last 8760 hours.  ProBNP (last 3 results) No results for input(s): PROBNP in the last 8760 hours.  CBG:  Recent Labs Lab 06/19/15 1715 06/19/15 2008 06/19/15 2039 06/20/15 0002 06/20/15 0417  GLUCAP 75 61* 120* 115* 130*    Recent Results (from the past 240 hour(s))  Culture, blood (routine x 2)     Status: None   Collection Time: 06/14/15  3:25 PM  Result Value Ref Range Status   Specimen Description BLOOD LEFT HAND  Final   Special Requests IN PEDIATRIC BOTTLE 3CC  Final   Culture  Setup Time   Final    GRAM POSITIVE COCCI IN CLUSTERS AEROBIC BOTTLE ONLY CRITICAL RESULT CALLED TO, READ BACK BY AND VERIFIED WITH: M MILLS 06/15/15 @ 0936 M VESTAL    Culture  Final    STAPHYLOCOCCUS SPECIES (COAGULASE NEGATIVE) SUSCEPTIBILITIES PERFORMED ON PREVIOUS CULTURE WITHIN THE LAST 5 DAYS.    Report Status 06/18/2015 FINAL  Final  Culture, blood (routine x 2)     Status: None   Collection Time: 06/14/15  4:44 PM  Result Value Ref Range Status   Specimen Description BLOOD LEFT HAND  Final   Special Requests IN PEDIATRIC BOTTLE 3CC  Final   Culture  Setup Time   Final    GRAM POSITIVE COCCI IN CLUSTERS AEROBIC BOTTLE ONLY CRITICAL RESULT CALLED TO, READ BACK BY AND VERIFIED WITH: R SARINE RN @2227  06/15/15 MKELLY    Culture STAPHYLOCOCCUS SPECIES (COAGULASE NEGATIVE)  Final   Report Status 06/18/2015 FINAL  Final   Organism ID, Bacteria STAPHYLOCOCCUS SPECIES (COAGULASE NEGATIVE)  Final      Susceptibility   Staphylococcus species (coagulase negative) - MIC*    CIPROFLOXACIN >=8 RESISTANT Resistant     ERYTHROMYCIN >=8 RESISTANT Resistant     GENTAMICIN <=0.5 SENSITIVE Sensitive     OXACILLIN >=4 RESISTANT Resistant     TETRACYCLINE <=1 SENSITIVE Sensitive     VANCOMYCIN <=0.5 SENSITIVE Sensitive     TRIMETH/SULFA <=10 SENSITIVE Sensitive     CLINDAMYCIN >=8 RESISTANT Resistant      RIFAMPIN <=0.5 SENSITIVE Sensitive     Inducible Clindamycin NEGATIVE Sensitive     * STAPHYLOCOCCUS SPECIES (COAGULASE NEGATIVE)  Gastrointestinal Panel by PCR , Stool     Status: None   Collection Time: 06/18/15  9:52 PM  Result Value Ref Range Status   Campylobacter species NOT DETECTED NOT DETECTED Final   Plesimonas shigelloides NOT DETECTED NOT DETECTED Final   Salmonella species NOT DETECTED NOT DETECTED Final   Yersinia enterocolitica NOT DETECTED NOT DETECTED Final   Vibrio species NOT DETECTED NOT DETECTED Final   Vibrio cholerae NOT DETECTED NOT DETECTED Final   Enteroaggregative E coli (EAEC) NOT DETECTED NOT DETECTED Final   Enteropathogenic E coli (EPEC) NOT DETECTED NOT DETECTED Final   Enterotoxigenic E coli (ETEC) NOT DETECTED NOT DETECTED Final   Shiga like toxin producing E coli (STEC) NOT DETECTED NOT DETECTED Final   E. coli O157 NOT DETECTED NOT DETECTED Final   Shigella/Enteroinvasive E coli (EIEC) NOT DETECTED NOT DETECTED Final   Cryptosporidium NOT DETECTED NOT DETECTED Final   Cyclospora cayetanensis NOT DETECTED NOT DETECTED Final   Entamoeba histolytica NOT DETECTED NOT DETECTED Final   Giardia lamblia NOT DETECTED NOT DETECTED Final   Adenovirus F40/41 NOT DETECTED NOT DETECTED Final   Astrovirus NOT DETECTED NOT DETECTED Final   Norovirus GI/GII NOT DETECTED NOT DETECTED Final   Rotavirus A NOT DETECTED NOT DETECTED Final   Sapovirus (I, II, IV, and V) NOT DETECTED NOT DETECTED Final     Studies:  Recent x-ray studies have been reviewed in detail by the Attending Physician  Scheduled Meds:  Scheduled Meds: . antiseptic oral rinse  7 mL Mouth Rinse BID  . aspirin  81 mg Per Tube Daily  . atorvastatin  10 mg Per Tube q1800  . citalopram  20 mg Per Tube Daily  . feeding supplement (PRO-STAT SUGAR FREE 64)  30 mL Per Tube BID  . free water  200 mL Per Tube 4 times per day  . gabapentin  100 mg Per Tube 3 times per day  . Gerhardt's butt cream    Topical BID  . heparin  5,000 Units Subcutaneous 3 times per day  . hydrALAZINE  25 mg Per  Tube Q12H  . insulin aspart  0-15 Units Subcutaneous 6 times per day  . insulin NPH Human  15 Units Subcutaneous Q6H  . BIOGAIA PROBIOTIC  5 mL Per Tube BID  . lamoTRIgine  50 mg Per Tube Daily  . levETIRAcetam  500 mg Per Tube BID  . pantoprazole sodium  40 mg Per Tube Daily  . predniSONE  5 mg Per Tube Daily  . sodium chloride  10-40 mL Intracatheter Q12H  . sucralfate  1 g Per Tube Q8H  . tacrolimus  3.5 mg Per Tube BID  . vancomycin  750 mg Intravenous Q12H    Time spent on care of this patient: 40 mins   Tayli Buch, Geraldo Docker , MD  [facility] Office  [phone] Pager - [phone]  On-Call/Text Page:      [email or web address]      password [email or web address]  If 7PM-7AM, please contact night-coverage [email or web address] Password [email or web address] 06/20/2015, 8:04 AM   LOS: 12 days   Care during the described time interval was provided by me .  I have reviewed this patient's available data, including medical history, events of note, physical examination, and all test results as part of my evaluation. I have personally reviewed and interpreted all radiology studies.   Dia Crawford, MD [phone] Pager

## 2015-06-21 DIAGNOSIS — R7881 Bacteremia: Secondary | ICD-10-CM | POA: Diagnosis present

## 2015-06-21 DIAGNOSIS — B9561 Methicillin susceptible Staphylococcus aureus infection as the cause of diseases classified elsewhere: Secondary | ICD-10-CM

## 2015-06-21 LAB — COMPREHENSIVE METABOLIC PANEL
ALBUMIN: 2 g/dL — AB (ref 3.5–5.0)
ALT: 16 U/L — ABNORMAL LOW (ref 17–63)
AST: 19 U/L (ref 15–41)
Alkaline Phosphatase: 77 U/L (ref 38–126)
Anion gap: 4 — ABNORMAL LOW (ref 5–15)
BUN: 30 mg/dL — AB (ref 6–20)
CHLORIDE: 103 mmol/L (ref 101–111)
CO2: 31 mmol/L (ref 22–32)
Calcium: 10 mg/dL (ref 8.9–10.3)
Creatinine, Ser: 1.29 mg/dL — ABNORMAL HIGH (ref 0.61–1.24)
GFR calc Af Amer: >60 mL/min (ref >60)
GFR calc non Af Amer: 58 mL/min — ABNORMAL LOW (ref >60)
GLUCOSE: 101 mg/dL — AB (ref 65–99)
POTASSIUM: 4.5 mmol/L (ref 3.5–5.1)
SODIUM: 138 mmol/L (ref 135–145)
Total Bilirubin: 0.5 mg/dL (ref 0.3–1.2)
Total Protein: 5.8 g/dL — ABNORMAL LOW (ref 6.5–8.1)

## 2015-06-21 LAB — GLUCOSE, CAPILLARY
GLUCOSE-CAPILLARY: 103 mg/dL — AB (ref 65–99)
GLUCOSE-CAPILLARY: 114 mg/dL — AB (ref 65–99)
Glucose-Capillary: 82 mg/dL (ref 65–99)
Glucose-Capillary: 185 mg/dL — ABNORMAL HIGH (ref 65–99)
Glucose-Capillary: 193 mg/dL — ABNORMAL HIGH (ref 65–99)
Glucose-Capillary: 96 mg/dL (ref 65–99)

## 2015-06-21 LAB — CBC WITH DIFFERENTIAL/PLATELET
BASOS PCT: 0 %
Basophils Absolute: 0 10*3/uL (ref 0.0–0.1)
Eosinophils Absolute: 0 10*3/uL (ref 0.0–0.7)
Eosinophils Relative: 0 %
HEMATOCRIT: 36.5 % — AB (ref 39.0–52.0)
HEMOGLOBIN: 11.9 g/dL — AB (ref 13.0–17.0)
LYMPHS PCT: 22 %
Lymphs Abs: 1.8 10*3/uL (ref 0.7–4.0)
MCH: 28.2 pg (ref 26.0–34.0)
MCHC: 32.6 g/dL (ref 30.0–36.0)
MCV: 86.5 fL (ref 78.0–100.0)
MONOS PCT: 1 %
Monocytes Absolute: 0.1 10*3/uL (ref 0.1–1.0)
NEUTROS ABS: 6.5 10*3/uL (ref 1.7–7.7)
Neutrophils Relative %: 77 %
Platelets: 288 10*3/uL (ref 150–400)
RBC: 4.22 MIL/uL (ref 4.22–5.81)
RDW: 15.4 % (ref 11.5–15.5)
WBC: 8.4 10*3/uL (ref 4.0–10.5)

## 2015-06-21 LAB — MAGNESIUM: Magnesium: 2.2 mg/dL (ref 1.7–2.4)

## 2015-06-21 LAB — VANCOMYCIN, RANDOM: Vancomycin Rm: 31 ug/mL

## 2015-06-21 MED ORDER — ACYCLOVIR 200 MG/5ML PO SUSP
400.0000 mg | Freq: Three times a day (TID) | ORAL | Status: DC
  Administered 2015-06-21 – 2015-06-23 (×6): 400 mg
  Filled 2015-06-21 (×12): qty 10

## 2015-06-21 NOTE — Progress Notes (Signed)
Pt transferred from [facility] this pm. Alert and able to follow simple commands but has extensive expressive aphasia and can barely make himself understood. No s/s of pain/discomfort noted. Skin condition poor, has multiple skin tears and lacerations all over left lower extremity. Hemodialysis graft site noted to left upper extremity

## 2015-06-21 NOTE — Progress Notes (Signed)
[facility] TEAM 1 - Stepdown/ICU TEAM Progress Note  Justin [name] [id number] DOB: 1955-08-01 DOA: 06/08/2015 PCP: Garwin Brothers, MD  Admit HPI / Brief Narrative: 59 y.o. 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 He was brought to [facility] evening of 12/5 after staff at SNF found him agonally breathing since 3 PM.  In ED, he was completely unresponsive and agonal. He subsequently required intubation for airway protection. During intubation, a piece of tissue paper was removed from pt's posterior pharynx.  He had recent admission 04/05/15 through 04/10/15 for urosepsis.  HPI/Subjective: 12/18, alert, will track you with his eyes when you call his name. Respond to some commands (squeezes your fingers with left hand/wiggle his left toes). Positive perseveration (repeat I'm fine).    Assessment/Plan: Acute hypoxic respiratory failure: - Secondary to healthcare associated pneumonia/aspiration pneumonia, Resolved. Status post extubation 12/13.  Acute encephalopathy:  -Improving. Per RN; daughter stated patient is at baseline.  -Known Hx  Pseudobulbar affect. Baseline mental status is poor.   Gram-positive cocci bacteremia (2/2 positive coag negative staph) -Continue vancomycin; 2 weeks worth of treatment? -Repeat blood cultures NGTD  Escherichia coli & Klebsiella pneumonia: - Likely secondary to aspiration. Status post treatment.  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  -Will consult ID after results obtained.; Per wound care  recommendation from 12/6 -HIV negative  Chronic diarrhea -Stool lactoferrin positive -GI panel negative. Will start patient on loperamide 4 mg QID -Stool electrolytes pending -Stool osmolality pending -Fecal fat pending   Diabetes mellitus Type 2 uncontrolled:  -12/15 hemoglobin A1c = 11.2  -Continuing NPH 15 units subcutaneous every 6 hours -Continue moderate SSI.  Sacral decubitus ulcer/scrotal ulcers/bilateral lower extremity ulcers -Wound care was consulted on 12/6 and recommendations were that the area of infection/wounds was so severe it was beyond their scope of practice and recommended ID consult.  -See Genital hepatic lesions  Seizure:  -Occurred on 12/6 while on imipenem. Known h/o ICH. Reported prior history of "convulsions". No seizure activity since.  -EEG without focal activity that would suggest seizure.  -Continuing Gabapentin 100 mg via tube q8hr  -Continue  Keppra 500 mg via tube BID  -Versed IV PRN seizure activity.  -Seizure precautions.  Acute on chronic renal failure: S/P Renal transplant -Resolving. Likely secondary to previous diuresis.  -Strict I and O's since admission + 13.1 L -Daily weight admission weight = 80.8 kg                         12/18 weight= 85.8 kg (accuracy?) -Continue Prograf -Prograf level = 4.7 WNL -Continue prednisone  Funguria:  -Patient had no Foley catheter on admission. Treated with 2 doses of Diflucan on 12/8 & 12/9. -Repeat urine culture positive for yeast. Review of chart and discussion with pharmacist reveals that not initially completely treated. Will restart Diflucan and treat for 10 day course -Diflucan 200 mg 1 then 100 mg daily  HTN -Hydralazine 25 mg BID -Holding amlodipine & Isordil.  -Elevated troponin I: -Continuing aspirin 81 mg daily  -Continue Lipitor 10 mg daily. -  Lipid panel pending  Prolonged QTc:  -EKG on 12/13 showed 471 ms. Continuing to monitor the patient on telemetry.  Depression/Anxiety:    -Continuing Celexa 20 mg daily  -Continue Lamictal 50 mg daily.  Normocytic Anemia -Negative sign of active bleeding -Anemia panel/RI= 0.97 most consistent with normocytic anemia -Most likely secondary to chronic disease   ICH:  -Residual right hemiparesis  -CT scan from 12/5. Negative acute findings  B-cell lymphoma:  -S/P XRT. Continuing to monitor cell counts with CBC daily.  Diet:  -Possibly causing patient's chronic diarrhea although could have infectious component. See chronic diarrhea  -Continue tube feeds via PEG. Continuing free water flushes every 6 hours. Probiotic via tube twice a day.    Goals of care -CODE STATUS: Prior discussions with family members by Dr. Chase Caller with daughters wishing the patient to remain full code. -Placed palliative care consult considering patient's poor quality of life, multiple drug-resistant infections, and baseline mental status patient will continue to do poorly. Palliative care set up meeting for Saturday 12/17 at [facility].     Code Status: FULL Family Communication: no family present at time of exam Disposition Plan: Will await goals of care meeting with family    Consultants: Dr.Jennings E Nestor PCCM [facility] hospitalist    Procedure/Significant Events: TTE 10/3: Mild LVH w/ EF 55-60%. Normal regional wall motion. Grade 1 diastolic dysfunction. RV normal in size & function. CT HEAD 12/5: No acute intracranial pathology. Chronic encephalomalacia of left cerebral hemisphere. Moderate cortical volume loss with small vessel ischemic microangiopathy. EEG 12/6: Severe generalized slowing on Fentanyl. Pattern consistent w/ hypoxic injury or toxic encephalopathy. No evidence of seizure activity. Port CXR 12/11: Lordotic view. ETT 4cm above carina. Left IJ w/ tip in SVC. No opacity. EKG 12/13: QTc 445ms. Normal sinus rhythm.   Culture 12/6 MRSA by PCR negative 12/6:UrinePositive Yeast  100K 12/6 tracheal aspirate positive Klebsiella pneumonia(sensitive Cipro/Gent/Imipenem/Zosyn/Tobra)/Escherichia coli (sensitive Gent/Imipenem/Tobra/Zosyn/Bactrim) 12/11 blood left hand 2 positive coag negative staph 12/15 positive fecal lactoferrin 12/15 HIV negative 12/15 GI panel negative 12/16 blood left hand 2 NGTD 12/16 urine positive yeast 12/16 HSV 1& 2 pending   Antibiotics: Vanc 12/5 - 12/8; 12/12>>>  Aztreonam 12/5 - 12/6  Imipenem 12/7 Merrem (change due to [place]) 12/7 - 12/10 Diflucan 12/8 (yeast in Urine) - 12/09; 12/17>> Acyclovir 12/18>>   DVT prophylaxis: SCD, subcutaneous heparin   Devices    LINES / TUBES:  L IJ CVL 12/7>>> FOLEY 12/6>>> PEG>>> OGT 12/5 - 12/13 OETT 7.5 12/5 - 12/13    Continuous Infusions: . sodium chloride 500 mL (06/19/15 2200)  . feeding supplement (GLUCERNA 1.2 CAL) 1,000 mL (06/20/15 1722)    Objective: VITAL SIGNS: Temp: 98.6 F (37 C) (12/18 0800) Temp Source: Oral (12/18 0800) BP: 100/64 mmHg (12/18 0800) Pulse Rate: 70 (12/18 0800) SPO2; FIO2:   Intake/Output Summary (Last 24 hours) at 06/21/15 1135 Last data filed at 06/21/15 0700  Gross per 24 hour  Intake 1473.5 ml  Output   1550 ml  Net  -76.5 ml     Exam: General: Alert, will track you with his eyes when you call his name. Respond to some commands (squeezes your fingers with left hand/wiggle his left toes).  No acute respiratory distress Eyes: negative scleral hemorrhage ENT: Negative Runny nose, negative gingival bleeding, Neck:  Negative scars, masses, torticollis, lymphadenopathy, JVD Lungs: Clear to auscultation bilaterally without wheezes or crackles Cardiovascular: Regular rate and rhythm without murmur gallop or rub  normal S1 and S2 Abdomen:negative abdominal pain, nondistended, positive soft, bowel sounds, no rebound, no ascites, no appreciable mass, PEG tube in place negative sign of infection Genitourinary; warts on penis." Pt has  chronic condition with multiple red raised vesicles, some are dry crusted and yellow. Patchy areas of full thickness tissue loss, mod amt tan drainage with odor. Shaft of penis is red and macerated. Appearance is consistent with probable genital herpes. Multiple stage II ulcers on shaft of penis, scrotum. Stage II decubitus ulcer  Extremities: No significant cyanosis, clubbing. Multiple bilateral lower extremity ulcers Psychiatric:   unable to assess secondary to altered mental status (supposedly baseline)  Neurologic:  Cranial nerves II through XII intact, tongue/uvula midline,  right hemiparesis. Spontaneously moves [place]. Unable to assess further secondary to altered mental status.    Data Reviewed: Basic Metabolic Panel:  Recent Labs Lab 06/16/15 0500 06/17/15 0555 06/18/15 0442 06/18/15 0443 06/19/15 0138 06/20/15 0604 06/21/15 0220  NA 146* 143  --  141 138 139 138  K 3.8 4.3  --  3.9 4.4 4.7 4.5  CL 114* 111  --  107 105 103 103  CO2 26 28  --  28 28 32 31  GLUCOSE 145* 221*  --  153* 207* 133* 101*  BUN 43* 35*  --  31* 28* 27* 30*  CREATININE 1.69* 1.44*  --  1.28* 1.15 1.25* 1.29*  CALCIUM 9.7 9.9  --  9.7 9.7 10.2 10.0  MG 2.4 2.3 2.2  --  2.3 2.5* 2.2  PHOS 2.3* 2.3*  --  2.2*  --   --   --    Liver Function Tests:  Recent Labs Lab 06/17/15 0555 06/18/15 0443 06/19/15 0138 06/20/15 0604 06/21/15 0220  AST  --   --  20 23 19   ALT  --   --  20 17 16*  ALKPHOS  --   --  78 69 77  BILITOT  --   --  0.5 0.6 0.5  PROT  --   --  6.5 6.7 5.8*  ALBUMIN 2.0* 1.9* 1.9* 2.0* 2.0*   No results for input(s): LIPASE, AMYLASE in the last 168 hours. No results for input(s): AMMONIA in the last 168 hours. CBC:  Recent Labs Lab 06/17/15 0555 06/18/15 0442 06/19/15 0138 06/20/15 0604 06/21/15 0220  WBC 7.4 7.3 7.5 7.6 8.4  NEUTROABS 5.6 5.3 5.6 5.4 6.5  HGB 12.0* 11.8* 12.1* 12.5* 11.9*  HCT 41.4 39.9 40.6 41.2 36.5*  MCV 90.2 89.5 88.3 88.8 86.5  PLT 157 176  192 214 288   Cardiac Enzymes:  Recent Labs Lab 06/18/15 2222 06/19/15 0138 06/19/15 0728  TROPONINI 0.04* 0.08* 0.05*   BNP (last 3 results) No results for input(s): BNP in the last 8760 hours.  ProBNP (last 3 results) No results for input(s): PROBNP in the last 8760 hours.  CBG:  Recent Labs Lab 06/20/15 1322 06/20/15 1713 06/21/15 0010 06/21/15 0454 06/21/15 0812  GLUCAP 198* 137* 103* 82 114*    Recent Results (from the past 240 hour(s))  Culture, blood (routine x 2)     Status: None   Collection Time: 06/14/15  3:25 PM  Result Value Ref Range Status   Specimen Description BLOOD LEFT HAND  Final   Special Requests IN PEDIATRIC BOTTLE 3CC  Final   Culture  Setup Time   Final    GRAM POSITIVE COCCI IN CLUSTERS AEROBIC BOTTLE ONLY CRITICAL RESULT CALLED TO, READ BACK BY AND VERIFIED WITH: M  MILLS 06/15/15 @ 0936 M VESTAL    Culture   Final    STAPHYLOCOCCUS SPECIES (COAGULASE NEGATIVE) SUSCEPTIBILITIES PERFORMED ON PREVIOUS CULTURE WITHIN THE LAST 5 DAYS.    Report Status 06/18/2015 FINAL  Final  Culture, blood (routine x 2)     Status: None   Collection Time: 06/14/15  4:44 PM  Result Value Ref Range Status   Specimen Description BLOOD LEFT HAND  Final   Special Requests IN PEDIATRIC BOTTLE 3CC  Final   Culture  Setup Time   Final    GRAM POSITIVE COCCI IN CLUSTERS AEROBIC BOTTLE ONLY CRITICAL RESULT CALLED TO, READ BACK BY AND VERIFIED WITH: R SARINE RN @2227  06/15/15 MKELLY    Culture STAPHYLOCOCCUS SPECIES (COAGULASE NEGATIVE)  Final   Report Status 06/18/2015 FINAL  Final   Organism ID, Bacteria STAPHYLOCOCCUS SPECIES (COAGULASE NEGATIVE)  Final      Susceptibility   Staphylococcus species (coagulase negative) - MIC*    CIPROFLOXACIN >=8 RESISTANT Resistant     ERYTHROMYCIN >=8 RESISTANT Resistant     GENTAMICIN <=0.5 SENSITIVE Sensitive     OXACILLIN >=4 RESISTANT Resistant     TETRACYCLINE <=1 SENSITIVE Sensitive     VANCOMYCIN <=0.5 SENSITIVE  Sensitive     TRIMETH/SULFA <=10 SENSITIVE Sensitive     CLINDAMYCIN >=8 RESISTANT Resistant     RIFAMPIN <=0.5 SENSITIVE Sensitive     Inducible Clindamycin NEGATIVE Sensitive     * STAPHYLOCOCCUS SPECIES (COAGULASE NEGATIVE)  Gastrointestinal Panel by PCR , Stool     Status: None   Collection Time: 06/18/15  9:52 PM  Result Value Ref Range Status   Campylobacter species NOT DETECTED NOT DETECTED Final   Plesimonas shigelloides NOT DETECTED NOT DETECTED Final   Salmonella species NOT DETECTED NOT DETECTED Final   Yersinia enterocolitica NOT DETECTED NOT DETECTED Final   Vibrio species NOT DETECTED NOT DETECTED Final   Vibrio cholerae NOT DETECTED NOT DETECTED Final   Enteroaggregative E coli (EAEC) NOT DETECTED NOT DETECTED Final   Enteropathogenic E coli (EPEC) NOT DETECTED NOT DETECTED Final   Enterotoxigenic E coli (ETEC) NOT DETECTED NOT DETECTED Final   Shiga like toxin producing E coli (STEC) NOT DETECTED NOT DETECTED Final   E. coli O157 NOT DETECTED NOT DETECTED Final   Shigella/Enteroinvasive E coli (EIEC) NOT DETECTED NOT DETECTED Final   Cryptosporidium NOT DETECTED NOT DETECTED Final   Cyclospora cayetanensis NOT DETECTED NOT DETECTED Final   Entamoeba histolytica NOT DETECTED NOT DETECTED Final   Giardia lamblia NOT DETECTED NOT DETECTED Final   Adenovirus F40/41 NOT DETECTED NOT DETECTED Final   Astrovirus NOT DETECTED NOT DETECTED Final   Norovirus GI/GII NOT DETECTED NOT DETECTED Final   Rotavirus A NOT DETECTED NOT DETECTED Final   Sapovirus (I, II, IV, and V) NOT DETECTED NOT DETECTED Final  Culture, blood (routine x 2)     Status: None (Preliminary result)   Collection Time: 06/19/15  1:28 AM  Result Value Ref Range Status   Specimen Description BLOOD LEFT HAND  Final   Special Requests BOTTLES DRAWN AEROBIC AND ANAEROBIC 5ML  Final   Culture NO GROWTH 2 DAYS  Final   Report Status PENDING  Incomplete  Culture, blood (routine x 2)     Status: None (Preliminary  result)   Collection Time: 06/19/15  1:38 AM  Result Value Ref Range Status   Specimen Description BLOOD LEFT HAND  Final   Special Requests AEROBIC BOTTLE ONLY 6ML  Final   Culture  NO GROWTH 2 DAYS  Final   Report Status PENDING  Incomplete  Culture, Urine     Status: None   Collection Time: 06/19/15  6:03 AM  Result Value Ref Range Status   Specimen Description URINE, RANDOM  Final   Special Requests NONE  Final   Culture >=100,000 COLONIES/mL YEAST  Final   Report Status 06/20/2015 FINAL  Final     Studies:  Recent x-ray studies have been reviewed in detail by the Attending Physician  Scheduled Meds:  Scheduled Meds: . acyclovir  400 mg Per Tube TID  . antiseptic oral rinse  7 mL Mouth Rinse BID  . aspirin  81 mg Per Tube Daily  . atorvastatin  10 mg Per Tube q1800  . citalopram  20 mg Per Tube Daily  . feeding supplement (PRO-STAT SUGAR FREE 64)  30 mL Per Tube BID  . fluconazole  100 mg Per Tube Daily  . free water  200 mL Per Tube 4 times per day  . gabapentin  100 mg Per Tube 3 times per day  . Gerhardt's butt cream   Topical BID  . heparin  5,000 Units Subcutaneous 3 times per day  . hydrALAZINE  25 mg Per Tube Q12H  . insulin aspart  0-15 Units Subcutaneous 6 times per day  . insulin NPH Human  15 Units Subcutaneous Q6H  . BIOGAIA PROBIOTIC  5 mL Per Tube BID  . lamoTRIgine  50 mg Per Tube Daily  . levETIRAcetam  500 mg Per Tube BID  . loperamide  4 mg Oral QID  . pantoprazole sodium  40 mg Per Tube Daily  . predniSONE  5 mg Per Tube Daily  . sodium chloride  10-40 mL Intracatheter Q12H  . sucralfate  1 g Per Tube Q8H  . tacrolimus  3.5 mg Per Tube BID    Time spent on care of this patient: 40 mins   WOODS, Geraldo Docker , MD  [facility] Office  [phone] Pager - [phone]  On-Call/Text Page:      [email or web address]      password [email or web address]  If 7PM-7AM, please contact night-coverage [email or web address] Password [email or web address] 06/21/2015, 11:35 AM   LOS: 13 days    Care during the described time interval was provided by me .  I have reviewed this patient's available data, including medical history, events of note, physical examination, and all test results as part of my evaluation. I have personally reviewed and interpreted all radiology studies.   Dia Crawford, MD [phone] Pager

## 2015-06-21 NOTE — Progress Notes (Signed)
Wound care consult placed as pt has multiple sacral wounds and skin tears to left lower extremity

## 2015-06-21 NOTE — Progress Notes (Signed)
Pharmacy Antibiotic Follow-up Note  KRISHAN TALERICO is a 59 y.o. year-old male admitted on 06/08/2015.  The patient is currently on day 7 of Vancomycin for Coag neg staph bacteremia.  Assessment/Plan: A vancomycin trough drawn today was elevated at 30. Pt already received his dose after the trough was drawn prior to the level being evaluated. Another levels was checked today and remains elevated at 31. Will continue to hold vancomycin and check another level tomorrow morning.   Temp (24hrs), Avg:98.3 F (36.8 C), Min:97.4 F (36.3 C), Max:98.9 F (37.2 C)   Recent Labs Lab 06/17/15 0555 06/18/15 0442 06/19/15 0138 06/20/15 0604 06/21/15 0220  WBC 7.4 7.3 7.5 7.6 8.4     Recent Labs Lab 06/17/15 0555 06/18/15 0443 06/19/15 0138 06/20/15 0604 06/21/15 0220  CREATININE 1.44* 1.28* 1.15 1.25* 1.29*   Estimated Creatinine Clearance: 66 mL/min (by C-G formula based on Cr of 1.29).    Allergies  Allergen Reactions  . Amoxicillin Other (See Comments)    ON MAR  . Ampicillin Other (See Comments)    ON MAR  . Latex Other (See Comments)    ON MAR  . Morphine And Related Other (See Comments)    UNK  . Penicillins Other (See Comments)    ON MAR; tolerated Ceftriaxone 8/27-9/1  . Tape Other (See Comments)    ON MAR    Antimicrobials this admission: Fluconazole 12/8 >>12/10 Vanc 12/5 > 12/7,  Restart 12/12>> Aztreonam 12/5 > 12/6 Primax 12/6 > 12/7 Meropenem 12/7 >12/10  Levels/dose changes this admission: VT 12/17 = 30 (drawn appropriately per RN) VR 12/17 = 31  Microbiology results: 12/5 blood x 2 >> 1/2 CONS 12/6 urine- yeast > 100K 12/6 MRSA neg 12/6 TA >> ecoli esbl (S-gent, imipenem, zosyn, bactrim,tobra), kleb pneumo (S-cipro,gent,imi,zosyn,tobra) 12/11 BCx2>> 2/2 CoNS   Thank you for allowing pharmacy to be a part of this patient's care.  Salome Arnt, PharmD, BCPS Pager # [phone] 06/21/2015 9:35 AM

## 2015-06-21 NOTE — Progress Notes (Signed)
Telephone order given by MD to discontinue cardiac monitoring

## 2015-06-22 LAB — CBC WITH DIFFERENTIAL/PLATELET
BASOS PCT: 0 %
Basophils Absolute: 0 10*3/uL (ref 0.0–0.1)
EOS PCT: 1 %
Eosinophils Absolute: 0.1 10*3/uL (ref 0.0–0.7)
HCT: 39.9 % (ref 39.0–52.0)
HEMOGLOBIN: 12.3 g/dL — AB (ref 13.0–17.0)
Lymphocytes Relative: 35 %
Lymphs Abs: 2.2 10*3/uL (ref 0.7–4.0)
MCH: 27.4 pg (ref 26.0–34.0)
MCHC: 30.8 g/dL (ref 30.0–36.0)
MCV: 88.9 fL (ref 78.0–100.0)
Monocytes Absolute: 0.3 10*3/uL (ref 0.1–1.0)
Monocytes Relative: 4 %
NEUTROS ABS: 3.8 10*3/uL (ref 1.7–7.7)
NEUTROS PCT: 60 %
PLATELETS: 304 10*3/uL (ref 150–400)
RBC: 4.49 MIL/uL (ref 4.22–5.81)
RDW: 14.5 % (ref 11.5–15.5)
WBC: 6.4 10*3/uL (ref 4.0–10.5)

## 2015-06-22 LAB — COMPREHENSIVE METABOLIC PANEL
ALBUMIN: 2.3 g/dL — AB (ref 3.5–5.0)
ALK PHOS: 79 U/L (ref 38–126)
ALT: 15 U/L — AB (ref 17–63)
ANION GAP: 6 (ref 5–15)
AST: 22 U/L (ref 15–41)
BILIRUBIN TOTAL: 0.5 mg/dL (ref 0.3–1.2)
BUN: 35 mg/dL — AB (ref 6–20)
CALCIUM: 10.4 mg/dL — AB (ref 8.9–10.3)
CO2: 32 mmol/L (ref 22–32)
Chloride: 99 mmol/L — ABNORMAL LOW (ref 101–111)
Creatinine, Ser: 1.37 mg/dL — ABNORMAL HIGH (ref 0.61–1.24)
GFR calc Af Amer: >60 mL/min (ref >60)
GFR calc non Af Amer: 54 mL/min — ABNORMAL LOW (ref >60)
GLUCOSE: 155 mg/dL — AB (ref 65–99)
Potassium: 4.9 mmol/L (ref 3.5–5.1)
SODIUM: 137 mmol/L (ref 135–145)
TOTAL PROTEIN: 6.6 g/dL (ref 6.5–8.1)

## 2015-06-22 LAB — VANCOMYCIN, RANDOM: Vancomycin Rm: 21 ug/mL

## 2015-06-22 LAB — GLUCOSE, CAPILLARY
GLUCOSE-CAPILLARY: 166 mg/dL — AB (ref 65–99)
GLUCOSE-CAPILLARY: 180 mg/dL — AB (ref 65–99)
Glucose-Capillary: 126 mg/dL — ABNORMAL HIGH (ref 65–99)
Glucose-Capillary: 134 mg/dL — ABNORMAL HIGH (ref 65–99)
Glucose-Capillary: 169 mg/dL — ABNORMAL HIGH (ref 65–99)
Glucose-Capillary: 183 mg/dL — ABNORMAL HIGH (ref 65–99)

## 2015-06-22 LAB — HSV(HERPES SMPLX)ABS-I+II(IGG+IGM)-BLD
HSV 1 Glycoprotein G Ab, IgG: 1.83 IV — ABNORMAL HIGH
HSV 2 Glycoprotein G Ab, IgG: 5.57 IV — ABNORMAL HIGH
HSVI/II Comb IgM: 1.67 INDEX — ABNORMAL HIGH

## 2015-06-22 LAB — MAGNESIUM: Magnesium: 2.4 mg/dL (ref 1.7–2.4)

## 2015-06-22 MED ORDER — LOPERAMIDE HCL 1 MG/5ML PO LIQD
2.0000 mg | Freq: Four times a day (QID) | ORAL | Status: DC
Start: 2015-06-22 — End: 2015-06-23
  Administered 2015-06-22 – 2015-06-23 (×4): 2 mg
  Filled 2015-06-22 (×6): qty 10

## 2015-06-22 MED ORDER — VANCOMYCIN HCL IN DEXTROSE 750-5 MG/150ML-% IV SOLN
750.0000 mg | INTRAVENOUS | Status: DC
  Administered 2015-06-22: 750 mg via INTRAVENOUS
  Filled 2015-06-22 (×2): qty 150

## 2015-06-22 NOTE — Consult Note (Addendum)
WOC wound consult note Reason for Consult: sacrum and multiple skin tears Wound type: Unclear etiology on the lower extremities, I do not feel they are related to pressure due to presentation and location.  All full thickness, scarring noted over both LE consistent with other lesions in the past. Patient has weak but palpable pulses bilaterally.  Sacral fissure; full thickness Right ischial; Stage 2 Pressure Injury Pressure Ulcer POA: Yes Measurement: Sacral fissure 4cm x 0.3cm x 0.2cm; clean, pink, moist Right ischial: 4.5cm x 2.5cm x 0.2cm; 50% yellow fibrin, 50% pale pink Left knee: 0.5cm x 0.5cm x 0.1cm: 100% pink, hypergranulation Distal right pretibial 0.5cm x 0.1cm x 0.1cm; pink, clean Medial right pretibial: 0.5cm x 0.5cm x 0.1cm; pink, clean Lateral right leg: 1.0cm x 0.5cm x 0.1cm; 100% eschar Right elbow: 0.5cm x 0.5cm x 0.1cm; hypergranulated 100% MASD (moisture associated skin damage) posterior scrotum Penile and suprapubic lesions, see urology notes. Wound bed: see above Drainage (amount, consistency, odor) all areas are draining serosanguinous, minimal  Periwound: intact with scaring evidence of other ulcers Dressing procedure/placement/frequency: Continue soft silicone foam to the extremities, fissure, and right ischial.  Patient turns well if encouraged, will not add air mattress at this time. Patient is incontinent, if dressing adherence becomes an issue switch to barrier cream for the fissure and right ischial wounds.   Discussed POC with patient and bedside nurse.  Re consult if needed, will not follow at this time. Thanks  Justin [name], [place] [phone])

## 2015-06-22 NOTE — Progress Notes (Signed)
[facility] TEAM 1 - Stepdown/ICU TEAM PROGRESS NOTE  HARSHIT PANTOJA [id number] DOB: February 25, 1956 DOA: 06/08/2015 PCP: Garwin Brothers, MD  Admit HPI / Brief Narrative: 59 y.o. 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  In ED, he was completely unresponsive and agonal. He subsequently required intubation for airway protection. During intubation, a piece of tissue paper was removed from pt's posterior pharynx.  He had a prior admission 04/05/15 through 04/10/15 for urosepsis.  HPI/Subjective: Pt is alert and follows simple commands.  He tells me he "is fine" but is unable to converse further, as per his baseline.  No family present at time of exam.    Assessment/Plan:  Acute hypoxic respiratory failure -Secondary to presumed aspiration event (paper found in airway) - resolved   Acute encephalopathy -resolved - per RN daughter stated patient is at baseline- known Hxpseudobulbar affect  Acute on chronic renal failure - S/P Renal transplant -crt currently on an upward trend - gently hydrate today - recheck in AM - cont prednisone and prograf as per usual outpt regimen   2/2 blood cultures - coag negative multi-drug resistant staph bacteremia -plan at present is to complete 14 days of vanc tx then reassess - no audible M on exam - no clinical findings to suggest ongoing infection/SBE   -repeat blood cultures drawn 12/16 NGTD - will need repeat cultures following completion of abx tx   Escherichia coli & Klebsiella pneumoniae PNA  -Likely  secondary to aspiration - has completed tx course - no clinical sx to suggest ongoing/active infection   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 + but this does not distinguish acute infection from infection in prior year, nor does it distinguish HSV1 from HSV2 -empiric treatment w/ acyclovir 400 mg TID continues  -has the clinical appearance of HPV in my opinion - doubt tx will affect course  -HIV negative  Chronic diarrhea -appears to be persistent, despite loperamide 4 mg QID - likely related to tube feeding  -Stool electrolytes and osmolality still pending -Fecal fat pending   Diabetes mellitus Type 2 uncontrolled:  -12/15 A1c 11.2  -CBG currently reasonably controlled   Sacral decubitus ulcer / scrotal ulcers / bilateral lower extremity ulcers -cont local wound care as prescribed by [place] RN  Seizure -Occurred on 12/6 while on imipenem - no seizure activity since  -EEG without focal activity that would suggest seizure  -cont keppra   Funguria  -Patient had no Foley catheter on admission - previously treated with 2 doses of Diflucan on 12/8 & 12/9 -Repeat urine culture positive for yeast - restarted Diflucan for a planned complete 10 day course  HTN -BP currently well controlled   Prolonged QTc -EKG on 12/13 showed 471 ms - improved to 430 as of last tele strip   Depression/Anxiety  -Continuing Celexa and lamictal   Normocytic Anemia -likely secondary to chronic disease - Hgb slowly improving - no evidence of blood loss   Hx of ICH  -Residual right hemiparesis - CT head 12/5 w/o acute findings  B-cell lymphoma  -S/P XRT -  counts stable   Nutrition  -Continue tube feeds via PEG  Goals of care -Palliative Care set up meeting for Saturday 12/17 at 1300 but family was a NO SHOW - PC continues to follow   Code Status: FULL Family Communication: no family present at time of exam Disposition Plan: waiting on goals of care meeting w/ family and  stabilization of renal function   Consultants: PCCM Neuor  Procedures: TTE 10/3: Mild LVH w/ EF 55-60%. Normal regional wall motion. Grade 1 diastolic dysfunction. RV normal in size & function. CT HEAD 12/5: No acute intracranial pathology. Chronic encephalomalacia of left cerebral hemisphere. Moderate cortical volume loss with small vessel ischemic microangiopathy. EEG 12/6: Severe generalized slowing on Fentanyl. Pattern consistent w/ hypoxic injury or toxic encephalopathy. No evidence of seizure activity.  Current Antimicrobials: Vanc 12/12 > Diflucan 12/17 > Acyclovir 12/18 >   DVT prophylaxis: SQ heparin   Objective: Blood pressure 121/64, pulse 74, temperature 97.4 F (36.3 C), temperature source Oral, resp. rate 16, height 6' (1.829 m), weight 86.2 kg (190 lb 0.6 oz), SpO2 100 %.  Intake/Output Summary (Last 24 hours) at 06/22/15 1611 Last data filed at 06/21/15 1847  Gross per 24 hour  Intake      0 ml  Output    100 ml  Net   -100 ml   Exam: General: No acute respiratory distress Lungs: Clear to auscultation bilaterally without wheezes or crackles Cardiovascular: Regular rate and rhythm without murmur gallop or rub Abdomen: Nontender, nondistended, soft, bowel sounds positive, no rebound, no ascites, no appreciable mass - PEG insertion clean and dry  Extremities: No significant cyanosis, clubbing, or edema bilateral lower extremities  Data Reviewed: Basic Metabolic Panel:  Recent Labs Lab 06/16/15 0500 06/17/15 0555 06/18/15 0442 06/18/15 0443 06/19/15 0138 06/20/15 0604 06/21/15 0220 06/22/15 0446  NA 146* 143  --  141 138 139 138 137  K 3.8 4.3  --  3.9 4.4 4.7 4.5 4.9  CL 114* 111  --  107 105 103 103 99*  CO2 26 28  --  28 28 32 31 32  GLUCOSE 145* 221*  --  153* 207* 133* 101* 155*  BUN 43* 35*  --  31* 28* 27* 30* 35*  CREATININE 1.69* 1.44*  --  1.28* 1.15 1.25* 1.29* 1.37*  CALCIUM 9.7 9.9  --  9.7 9.7 10.2 10.0 10.4*  MG 2.4 2.3 2.2  --   2.3 2.5* 2.2 2.4  PHOS 2.3* 2.3*  --  2.2*  --   --   --   --     CBC:  Recent Labs Lab 06/18/15 0442 06/19/15 0138 06/20/15 0604 06/21/15 0220 06/22/15 0446  WBC 7.3 7.5 7.6 8.4 6.4  NEUTROABS 5.3 5.6 5.4 6.5 3.8  HGB 11.8* 12.1* 12.5* 11.9* 12.3*  HCT 39.9 40.6 41.2 36.5* 39.9  MCV 89.5 88.3 88.8 86.5 88.9  PLT 176 192 214 288 304    Liver Function Tests:  Recent Labs Lab 06/18/15 0443 06/19/15 0138 06/20/15 0604 06/21/15 0220 06/22/15 0446  AST  --  20 23 19 22   ALT  --  20 17 16* 15*  ALKPHOS  --  78 69 77 79  BILITOT  --  0.5 0.6 0.5 0.5  PROT  --  6.5 6.7 5.8* 6.6  ALBUMIN 1.9* 1.9* 2.0* 2.0* 2.3*    Cardiac Enzymes:  Recent Labs Lab 06/18/15 2222 06/19/15 0138 06/19/15 0728  TROPONINI 0.04* 0.08* 0.05*    CBG:  Recent  Labs Lab 06/21/15 1559 06/21/15 2159 06/22/15 0657 06/22/15 0800 06/22/15 1259  GLUCAP 193* 96 126* 134* 166*    Recent Results (from the past 240 hour(s))  Culture, blood (routine x 2)     Status: None   Collection Time: 06/14/15  3:25 PM  Result Value Ref Range Status   Specimen Description BLOOD LEFT HAND  Final   Special Requests IN PEDIATRIC BOTTLE 3CC  Final   Culture  Setup Time   Final    GRAM POSITIVE COCCI IN CLUSTERS AEROBIC BOTTLE ONLY CRITICAL RESULT CALLED TO, READ BACK BY AND VERIFIED WITH: M MILLS 06/15/15 @ 0936 M VESTAL    Culture   Final    STAPHYLOCOCCUS SPECIES (COAGULASE NEGATIVE) SUSCEPTIBILITIES PERFORMED ON PREVIOUS CULTURE WITHIN THE LAST 5 DAYS.    Report Status 06/18/2015 FINAL  Final  Culture, blood (routine x 2)     Status: None   Collection Time: 06/14/15  4:44 PM  Result Value Ref Range Status   Specimen Description BLOOD LEFT HAND  Final   Special Requests IN PEDIATRIC BOTTLE 3CC  Final   Culture  Setup Time   Final    GRAM POSITIVE COCCI IN CLUSTERS AEROBIC BOTTLE ONLY CRITICAL RESULT CALLED TO, READ BACK BY AND VERIFIED WITH: R SARINE RN @2227  06/15/15 MKELLY    Culture  STAPHYLOCOCCUS SPECIES (COAGULASE NEGATIVE)  Final   Report Status 06/18/2015 FINAL  Final   Organism ID, Bacteria STAPHYLOCOCCUS SPECIES (COAGULASE NEGATIVE)  Final      Susceptibility   Staphylococcus species (coagulase negative) - MIC*    CIPROFLOXACIN >=8 RESISTANT Resistant     ERYTHROMYCIN >=8 RESISTANT Resistant     GENTAMICIN <=0.5 SENSITIVE Sensitive     OXACILLIN >=4 RESISTANT Resistant     TETRACYCLINE <=1 SENSITIVE Sensitive     VANCOMYCIN <=0.5 SENSITIVE Sensitive     TRIMETH/SULFA <=10 SENSITIVE Sensitive     CLINDAMYCIN >=8 RESISTANT Resistant     RIFAMPIN <=0.5 SENSITIVE Sensitive     Inducible Clindamycin NEGATIVE Sensitive     * STAPHYLOCOCCUS SPECIES (COAGULASE NEGATIVE)  Gastrointestinal Panel by PCR , Stool     Status: None   Collection Time: 06/18/15  9:52 PM  Result Value Ref Range Status   Campylobacter species NOT DETECTED NOT DETECTED Final   Plesimonas shigelloides NOT DETECTED NOT DETECTED Final   Salmonella species NOT DETECTED NOT DETECTED Final   Yersinia enterocolitica NOT DETECTED NOT DETECTED Final   Vibrio species NOT DETECTED NOT DETECTED Final   Vibrio cholerae NOT DETECTED NOT DETECTED Final   Enteroaggregative E coli (EAEC) NOT DETECTED NOT DETECTED Final   Enteropathogenic E coli (EPEC) NOT DETECTED NOT DETECTED Final   Enterotoxigenic E coli (ETEC) NOT DETECTED NOT DETECTED Final   Shiga like toxin producing E coli (STEC) NOT DETECTED NOT DETECTED Final   E. coli O157 NOT DETECTED NOT DETECTED Final   Shigella/Enteroinvasive E coli (EIEC) NOT DETECTED NOT DETECTED Final   Cryptosporidium NOT DETECTED NOT DETECTED Final   Cyclospora cayetanensis NOT DETECTED NOT DETECTED Final   Entamoeba histolytica NOT DETECTED NOT DETECTED Final   Giardia lamblia NOT DETECTED NOT DETECTED Final   Adenovirus F40/41 NOT DETECTED NOT DETECTED Final   Astrovirus NOT DETECTED NOT DETECTED Final   Norovirus GI/GII NOT DETECTED NOT DETECTED Final   Rotavirus A  NOT DETECTED NOT DETECTED Final   Sapovirus (I, II, IV, and V) NOT DETECTED NOT DETECTED Final  Culture, blood (routine x 2)  Status: None (Preliminary result)   Collection Time: 06/19/15  1:28 AM  Result Value Ref Range Status   Specimen Description BLOOD LEFT HAND  Final   Special Requests BOTTLES DRAWN AEROBIC AND ANAEROBIC 5ML  Final   Culture NO GROWTH 3 DAYS  Final   Report Status PENDING  Incomplete  Culture, blood (routine x 2)     Status: None (Preliminary result)   Collection Time: 06/19/15  1:38 AM  Result Value Ref Range Status   Specimen Description BLOOD LEFT HAND  Final   Special Requests AEROBIC BOTTLE ONLY 6ML  Final   Culture NO GROWTH 3 DAYS  Final   Report Status PENDING  Incomplete  Culture, Urine     Status: None   Collection Time: 06/19/15  6:03 AM  Result Value Ref Range Status   Specimen Description URINE, RANDOM  Final   Special Requests NONE  Final   Culture >=100,000 COLONIES/mL YEAST  Final   Report Status 06/20/2015 FINAL  Final     Studies:   Recent x-ray studies have been reviewed in detail by the Attending Physician  Scheduled Meds:  Scheduled Meds: . acyclovir  400 mg Per Tube TID  . antiseptic oral rinse  7 mL Mouth Rinse BID  . aspirin  81 mg Per Tube Daily  . atorvastatin  10 mg Per Tube q1800  . citalopram  20 mg Per Tube Daily  . feeding supplement (PRO-STAT SUGAR FREE 64)  30 mL Per Tube BID  . fluconazole  100 mg Per Tube Daily  . free water  200 mL Per Tube 4 times per day  . gabapentin  100 mg Per Tube 3 times per day  . Gerhardt's butt cream   Topical BID  . heparin  5,000 Units Subcutaneous 3 times per day  . hydrALAZINE  25 mg Per Tube Q12H  . insulin aspart  0-15 Units Subcutaneous 6 times per day  . insulin NPH Human  15 Units Subcutaneous Q6H  . BIOGAIA PROBIOTIC  5 mL Per Tube BID  . lamoTRIgine  50 mg Per Tube Daily  . levETIRAcetam  500 mg Per Tube BID  . loperamide  4 mg Oral QID  . pantoprazole sodium  40 mg  Per Tube Daily  . predniSONE  5 mg Per Tube Daily  . sodium chloride  10-40 mL Intracatheter Q12H  . sucralfate  1 g Per Tube Q8H  . tacrolimus  3.5 mg Per Tube BID  . vancomycin  750 mg Intravenous Q48H    Time spent on care of this patient: 35 mins   Madailein Londo T , MD   [facility] Office  [phone] Pager - Text Page per Shea Evans as per below:  On-Call/Text Page:      [email or web address]      password [email or web address]  If 7PM-7AM, please contact night-coverage [email or web address] Password [email or web address] 06/22/2015, 4:11 PM   LOS: 14 days

## 2015-06-22 NOTE — Progress Notes (Signed)
Pharmacy Antibiotic Follow-up Note  Justin [name] is a 59 y.o. year-old male admitted on 06/08/2015.  The patient is currently on day 7 of Vancomycin for Coag neg staph bacteremia.  Assessment/Plan: A vancomycin random level drawn today was elevated at 21. With the random level of 31 yesterday, this gives a patient-specific ke of 0.0138 and t1/2 of ~50h.  Will restart vancomycin at 750mg  IV q48h. Continue to watch cultures, renal function/UOP, GOC  Temp (24hrs), Avg:98.1 F (36.7 C), Min:97.9 F (36.6 C), Max:98.6 F (37 C)   Recent Labs Lab 06/18/15 0442 06/19/15 0138 06/20/15 0604 06/21/15 0220 06/22/15 0446  WBC 7.3 7.5 7.6 8.4 6.4     Recent Labs Lab 06/18/15 0443 06/19/15 0138 06/20/15 0604 06/21/15 0220 06/22/15 0446  CREATININE 1.28* 1.15 1.25* 1.29* 1.37*   Estimated Creatinine Clearance: 62.1 mL/min (by C-G formula based on Cr of 1.37).    Allergies  Allergen Reactions  . Amoxicillin Other (See Comments)    ON MAR  . Ampicillin Other (See Comments)    ON MAR  . Latex Other (See Comments)    ON MAR  . Morphine And Related Other (See Comments)    UNK  . Penicillins Other (See Comments)    ON MAR; tolerated Ceftriaxone 8/27-9/1  . Tape Other (See Comments)    ON MAR    Antimicrobials this admission: Fluconazole 12/8 >>12/10; 12/17>> Aztreonam 12/5 > 12/6 Primax 12/6 > 12/7 Meropenem 12/7 >12/10 Acyclovir 12/18>> Vanc 12/5 > 12/7; 12/12>>  Levels/dose changes this admission: VT 12/17 = 30 (drawn appropriately per RN) VR 12/18 = 31 VR 12/19 = 21  Microbiology results: 12/5 blood x 2: 1/2 CoNS 12/6 urine- 100K yeast  12/6 MRSA neg 12/6 TA: ecoli esbl (S-gent, imipenem, zosyn, bactrim,tobra), kleb pneumo (S-cipro,gent,imi,zosyn,tobra) 12/11 BCx2: 2/2 CoNS (S-vanc/septra) 12/16 BCx2: ngtd   Thank you for allowing pharmacy to be a part of this patient's care.  Samora Jernberg D. Mabell Esguerra, PharmD, BCPS Clinical Pharmacist Pager:  [phone] 06/22/2015 7:45 AM

## 2015-06-23 DIAGNOSIS — B009 Herpesviral infection, unspecified: Secondary | ICD-10-CM

## 2015-06-23 DIAGNOSIS — B961 Klebsiella pneumoniae [K. pneumoniae] as the cause of diseases classified elsewhere: Secondary | ICD-10-CM

## 2015-06-23 DIAGNOSIS — R7881 Bacteremia: Secondary | ICD-10-CM | POA: Diagnosis present

## 2015-06-23 DIAGNOSIS — A6001 Herpesviral infection of penis: Secondary | ICD-10-CM | POA: Diagnosis present

## 2015-06-23 DIAGNOSIS — L97929 Non-pressure chronic ulcer of unspecified part of left lower leg with unspecified severity: Secondary | ICD-10-CM

## 2015-06-23 DIAGNOSIS — D649 Anemia, unspecified: Secondary | ICD-10-CM

## 2015-06-23 DIAGNOSIS — A498 Other bacterial infections of unspecified site: Secondary | ICD-10-CM | POA: Diagnosis present

## 2015-06-23 DIAGNOSIS — B957 Other staphylococcus as the cause of diseases classified elsewhere: Secondary | ICD-10-CM | POA: Diagnosis present

## 2015-06-23 LAB — COMPREHENSIVE METABOLIC PANEL
ALT: 13 U/L — ABNORMAL LOW (ref 17–63)
AST: 21 U/L (ref 15–41)
Albumin: 2.2 g/dL — ABNORMAL LOW (ref 3.5–5.0)
Alkaline Phosphatase: 69 U/L (ref 38–126)
Anion gap: 7 (ref 5–15)
BUN: 38 mg/dL — AB (ref 6–20)
CHLORIDE: 101 mmol/L (ref 101–111)
CO2: 31 mmol/L (ref 22–32)
CREATININE: 1.35 mg/dL — AB (ref 0.61–1.24)
Calcium: 10.5 mg/dL — ABNORMAL HIGH (ref 8.9–10.3)
GFR calc Af Amer: >60 mL/min (ref >60)
GFR calc non Af Amer: 55 mL/min — ABNORMAL LOW (ref >60)
Glucose, Bld: 112 mg/dL — ABNORMAL HIGH (ref 65–99)
POTASSIUM: 4.8 mmol/L (ref 3.5–5.1)
SODIUM: 139 mmol/L (ref 135–145)
Total Bilirubin: 0.5 mg/dL (ref 0.3–1.2)
Total Protein: 6.4 g/dL — ABNORMAL LOW (ref 6.5–8.1)

## 2015-06-23 LAB — GLUCOSE, CAPILLARY
GLUCOSE-CAPILLARY: 93 mg/dL (ref 65–99)
GLUCOSE-CAPILLARY: 138 mg/dL — AB (ref 65–99)
Glucose-Capillary: 117 mg/dL — ABNORMAL HIGH (ref 65–99)

## 2015-06-23 LAB — CBC
HEMATOCRIT: 38.8 % — AB (ref 39.0–52.0)
Hemoglobin: 11.8 g/dL — ABNORMAL LOW (ref 13.0–17.0)
MCH: 27.2 pg (ref 26.0–34.0)
MCHC: 30.4 g/dL (ref 30.0–36.0)
MCV: 89.4 fL (ref 78.0–100.0)
Platelets: 289 10*3/uL (ref 150–400)
RBC: 4.34 MIL/uL (ref 4.22–5.81)
RDW: 14.8 % (ref 11.5–15.5)
WBC: 5.7 10*3/uL (ref 4.0–10.5)

## 2015-06-23 MED ORDER — PRO-STAT SUGAR FREE PO LIQD: 30.0000 mL | Freq: Two times a day (BID) | ORAL | Status: DC

## 2015-06-23 MED ORDER — GERHARDT'S BUTT CREAM: 1.0000 "application " | TOPICAL_CREAM | Freq: Two times a day (BID) | CUTANEOUS | Status: DC

## 2015-06-23 MED ORDER — FLUCONAZOLE 40 MG/ML PO SUSR: 100.0000 mg | Freq: Every day | ORAL | Status: AC

## 2015-06-23 MED ORDER — FREE WATER: 200.0000 mL | Freq: Four times a day (QID) | Status: AC

## 2015-06-23 MED ORDER — ACYCLOVIR 200 MG/5ML PO SUSP: 400.0000 mg | Freq: Two times a day (BID) | ORAL | Status: AC

## 2015-06-23 MED ORDER — LOPERAMIDE HCL 1 MG/5ML PO LIQD: 2.0000 mg | Freq: Four times a day (QID) | ORAL | Status: AC

## 2015-06-23 MED ORDER — GABAPENTIN 250 MG/5ML PO SOLN: 100.0000 mg | Freq: Three times a day (TID) | ORAL | Status: AC

## 2015-06-23 MED ORDER — VANCOMYCIN HCL IN DEXTROSE 750-5 MG/150ML-% IV SOLN: 750.0000 mg | INTRAVENOUS | Status: AC

## 2015-06-23 MED ORDER — ACYCLOVIR 200 MG/5ML PO SUSP: 400.0000 mg | Freq: Three times a day (TID) | ORAL | Status: DC

## 2015-06-23 MED ORDER — INSULIN NPH (HUMAN) (ISOPHANE) 100 UNIT/ML ~~LOC~~ SUSP: 15.0000 [IU] | Freq: Four times a day (QID) | SUBCUTANEOUS | Status: DC

## 2015-06-23 MED ORDER — GLUCERNA 1.2 CAL PO LIQD: 1000.0000 mL | ORAL | Status: DC

## 2015-06-23 MED ORDER — ACYCLOVIR 200 MG/5ML PO SUSP: 400.0000 mg | Freq: Two times a day (BID) | ORAL | Status: DC

## 2015-06-23 NOTE — Progress Notes (Signed)
Dr. Reesa Chew to see pt at [facility].

## 2015-06-23 NOTE — Discharge Summary (Signed)
Physician Discharge Summary  Justin [name] [id number] DOB: 09/29/1955 DOA: 06/08/2015  PCP: Justin Brothers, MD  Admit date: 06/08/2015 Discharge date: 06/23/2015  Time spent: 40 minutes  Recommendations for Outpatient Follow-up:  Acute hypoxic respiratory failure: - Secondary to healthcare associated pneumonia/aspiration pneumonia, Resolved. Status post extubation 12/13.  Acute encephalopathy:  -Improving. Per RN; daughter stated patient is at baseline.  -Known Hx Pseudobulbar affect. Baseline mental status is poor.   Gram-positive cocci bacteremia (2/2 positive coag negative staph) multidrug resistant -Continue vancomycin; 2 weeks worth of treatment? -Repeat blood cultures NGTD -Upon completion of antibiotics SNF will need to repeat blood cultures  Escherichia coli & Klebsiella pneumonia: - Likely secondary to aspiration. Status post treatment.  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 Reesa [name], Justin [name] PCP to determine if infectious disease needs to be consulted as outpatient. At minimal recommend frequent monitoring of progression   Chronic diarrhea -Stool lactoferrin positive -GI panel negative. Will start patient on loperamide 4 mg QID -Stool electrolytes pending -Stool osmolality pending -Fecal fat pending  -Schedule follow-up Results still pending will need to follow-up with Dr. Reesa [name], Justin [name] PCP to determine results in 2 weeks  Diabetes mellitus Type 2 uncontrolled:  -12/15 hemoglobin A1c = 11.2  -Continuing NPH 15 units subcutaneous every 6 hours -Continue moderate SSI.  Sacral decubitus ulcer/scrotal ulcers/bilateral lower extremity ulcers -Wound care was consulted on 12/6 and recommendations were that the area of infection/wounds was so severe  it was beyond their scope of practice and recommended ID consult.  -See Genital hepatic lesions  Seizure:  -Occurred on 12/6 while on imipenem. Known h/o ICH. Reported prior history of "convulsions". No seizure activity since.  -EEG without focal activity that would suggest seizure.  -Continuing Gabapentin 100 mg via tube q8hr  -Continue Keppra 500 mg via tube BID   Acute on chronic renal failure: S/P Renal transplant(baselineCr~1.58-1.91) -Resolved  -Patient should be weighed at SNF today this will be his baseline weight. Recommend daily a.m. Weight -At discharge bed weight 12/20  weight= 85.3 kg  -Continue Prograf 3.5 mg BID -Prograf level = 4.7 WNL -Continue prednisone 5 mg daily  Funguria:  -Patient had no Foley catheter on admission. Treated with 2 doses of Diflucan on 12/8 & 12/9. -Repeat urine culture positive for yeast. Review of chart and discussion with pharmacist reveals that not initially completely treated. Will restart Diflucan and treat for 10 day course -Diflucan 200 mg 1 then 100 mg daily  HTN -Hydralazine 25 mg BID -Holding amlodipine & Isordil.  -Elevated troponin I: -Continuing aspirin 81 mg daily  -Continue Lipitor 10 mg daily. -Lipid panel pending  Prolonged QTc:  -EKG on 12/13 showed 471 ms. Continuing to monitor the patient on telemetry.  Depression/Anxiety:  -Continuing Celexa 20 mg daily  -Continue Lamictal 50 mg daily.  Normocytic Anemia -Negative sign of active bleeding -Anemia panel/RI= 0.97 most consistent with normocytic anemia -Most likely secondary to chronic disease   ICH:  -Residual right hemiparesis  -CT scan from 12/5. Negative acute findings  B-cell lymphoma:  -S/P XRT. Continuing to monitor cell counts with CBC daily.  Diet:  -Possibly causing patient's chronic diarrhea although could have infectious component. See chronic diarrhea  -Continue tube feeds via PEG. Continuing free water flushes every 6 hours.  Probiotic via  tube twice a day.    Goals of care -CODE STATUS: Prior discussions with family members by Dr. Chase Caller with daughters wishing the patient to remain full code. -Placed palliative care consult considering patient's poor quality of life, multiple drug-resistant infections, and baseline mental status patient will continue to do poorly. Palliative care set up meeting for Saturday 12/17 at [facility].     Discharge Diagnoses:  Active Problems:   Aspiration pneumonia (HCC)   Hyperglycemia   Hyperglycemic hyperosmolar nonketotic coma (HCC)   CKD (chronic kidney disease)   Seizure (HCC)   Encephalopathy acute   Endotracheally intubated   Acute respiratory failure with hypoxia (HCC)   Pressure ulcer   Acute respiratory failure with hypoxemia (HCC)   Acute encephalopathy   Gram-positive cocci bacteremia   Pneumonia due to Escherichia coli (HCC)   Bilateral pneumonia   Urinary tract infectious disease   Genital herpes   Chronic diarrhea   Diabetes mellitus type 2 in nonobese (HCC)   Sacral decubitus ulcer   Scrotal ulcer   Lower extremity ulceration (HCC)   Seizures (HCC)   Acute on chronic renal failure (HCC)   Anxiety state   Small B-cell lymphoma (HCC)   Pneumonia of both lungs due to Escherichia coli (HCC)   Pneumonia due to Klebsiella pneumoniae (HCC)   Hemorrhage, intracranial (HCC)   Palliative care encounter   Staphylococcus aureus bacteremia   Bacteremia, coagulase-negative staphylococcal   HSV-1 infection   Klebsiella pneumoniae infection   Normocytic anemia   Type 2 HSV infection of penis   Discharge Condition: Stable  Diet recommendation: Tube feeds  Filed Weights   06/21/15 0445 06/21/15 1527 06/23/15 0528  Weight: 85.8 kg (189 lb 2.5 oz) 86.2 kg (190 lb 0.6 oz) 85.3 kg (188 lb 0.8 oz)    History of present illness:  59 y.o. 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 He was brought to [facility] evening of 12/5 after staff at SNF found him agonally breathing since 3 PM.  In ED, he was completely unresponsive and agonal. He subsequently required intubation for airway protection. During intubation, a piece of tissue paper was removed from pt's posterior pharynx.  He had recent admission 04/05/15 through 04/10/15 for urosepsis. During his hospitalization patient was treated for multiple infections to include bacteremia quite negative staph, Escherichia coli pneumonia, Klebsiella pneumonia, severe general herpes, funguria, and multiple decubitus ulcers on his sacrum as well as scrotum and legs. Patient was also treated for diabetes type 2 uncontrolled. On several occasions attempted to speak with/arrange meetings with patient's daughters who is our his HCPOA, however they failed to show up to scheduled meetings in order to discuss seriousness of patient's condition. Again today CSW Lubertha Sayres attempted to contact daughters but was unsuccessful. Patient's condition has been stabilized however patient will need to continue on multiple medications. At completion of his medications patient will need to have repeat cultures drawn at his facility.    Consultants: [facility] hospitalist    Procedure/Significant Events: TTE 10/3: Mild LVH w/ EF 55-60%. Normal regional wall motion. Grade 1 diastolic dysfunction. RV normal in size & function.  CT HEAD 12/5: No acute intracranial pathology. Chronic encephalomalacia of left cerebral hemisphere. Moderate cortical volume loss with small vessel ischemic microangiopathy. EEG 12/6: Severe  generalized slowing on Fentanyl. Pattern consistent w/ hypoxic injury or toxic encephalopathy. No evidence of seizure activity. Port CXR 12/11: Lordotic view. ETT 4cm above carina. Left IJ w/ tip in SVC. No opacity. EKG 12/13: QTc 426ms. Normal sinus rhythm.   Culture 12/6 MRSA by PCR negative 12/6:UrinePositive Yeast 100K 12/6 tracheal aspirate positive Klebsiella pneumonia(sensitive Cipro/Gent/Imipenem/Zosyn/Tobra)/Escherichia coli (sensitive Gent/Imipenem/Tobra/Zosyn/Bactrim) 12/11 blood left hand 2 positive coag negative staph 12/15 positive fecal lactoferrin 12/15 HIV negative 12/15 GI panel negative 12/16 blood left hand 2 NGTD 12/16 urine positive yeast 12/16 HSV 1& 2 positive   Antibiotics: Vanc 12/5 - 12/8; 12/12>>>  Aztreonam 12/5 - 12/6  Imipenem 12/7 Merrem (change due to seiz) 12/7 - 12/10 Diflucan 12/8 (yeast in Urine) - 12/09; 12/17>> Acyclovir 12/18>>     Discharge Exam: Filed Vitals:   06/21/15 2043 06/22/15 1512 06/22/15 2150 06/23/15 0528  BP: 124/66 121/64 117/40 121/64  Pulse: 76 74 80 84  Temp: 97.9 F (36.6 C) 97.4 F (36.3 C) 97.7 F (36.5 C) 97.9 F (36.6 C)  TempSrc: Oral Oral Oral Oral  Resp: 19 16 17 17   Height:      Weight:    85.3 kg (188 lb 0.8 oz)  SpO2: 100% 100% 100% 99%    General: Alert, will track you with his eyes when you call his name. Respond to some commands (squeezes your fingers with left hand/wiggle his left toes). No acute respiratory distress Eyes: negative scleral hemorrhage ENT: Negative Runny nose, negative gingival bleeding, Neck: Negative scars, masses, torticollis, lymphadenopathy, JVD Lungs: Clear to auscultation bilaterally without wheezes or crackles Cardiovascular: Regular rate and rhythm without murmur gallop or rub normal S1 and S2 Abdomen:negative abdominal pain, nondistended, positive soft, bowel sounds, no rebound, no ascites, no appreciable mass, PEG tube in place negative sign of  infection Genitourinary; warts on penis." Pt has chronic condition with multiple red raised vesicles, some are dry crusted and yellow. Patchy areas of full thickness tissue loss, mod amt tan drainage with odor. Shaft of penis is red and macerated. Appearance is consistent with probable genital herpes. Multiple stage II ulcers on shaft of penis, scrotum. Stage II decubitus ulcer  Extremities: No significant cyanosis, clubbing. Multiple bilateral lower extremity ulcers Psychiatric: unable to assess secondary to altered mental status (supposedly baseline)  Neurologic: Cranial nerves II through XII intact, tongue/uvula midline, right hemiparesis. Spontaneously moves [place]. Unable to assess further secondary to altered mental status.     Discharge Instructions     Medication List    STOP taking these medications        allopurinol 100 MG tablet  Commonly known as:  ZYLOPRIM     amLODipine 5 MG tablet  Commonly known as:  NORVASC     ATIVAN 2 MG/ML injection  Generic drug:  LORazepam     fentaNYL 25 MCG/HR patch  Commonly known as:  DURAGESIC - dosed mcg/hr     gabapentin 300 MG capsule  Commonly known as:  NEURONTIN  Replaced by:  gabapentin 250 MG/5ML solution     HUMALOG KWIKPEN 100 UNIT/ML KiwkPen  Generic drug:  insulin lispro     insulin glargine 100 UNIT/ML injection  Commonly known as:  LANTUS     isosorbide dinitrate 10 MG tablet  Commonly known as:  ISORDIL     loperamide 2 MG tablet  Commonly  known as:  IMODIUM A-D  Replaced by:  loperamide 1 MG/5ML solution     LORazepam 0.5 MG tablet  Commonly known as:  ATIVAN     morphine 20 MG/ML concentrated solution  Commonly known as:  ROXANOL     omeprazole 20 MG capsule  Commonly known as:  PRILOSEC     oxyCODONE-acetaminophen 7.5-325 MG tablet  Commonly known as:  PERCOCET     PHOS-NAK 280-160-250 MG Pack  Generic drug:  potassium & sodium phosphates     ROCEPHIN 1 G injection  Generic drug:   cefTRIAXone     senna-docusate 8.6-50 MG tablet  Commonly known as:  Senokot-S     tiZANidine 2 MG tablet  Commonly known as:  ZANAFLEX      TAKE these medications        acyclovir 200 MG/5ML suspension  Commonly known as:  ZOVIRAX  Place 10 mLs (400 mg total) into feeding tube 3 (three) times daily.     acyclovir 200 MG/5ML suspension  Commonly known as:  ZOVIRAX  Take 10 mLs (400 mg total) by mouth 2 (two) times daily.  Start taking on:  07/02/2015     aspirin 81 MG tablet  Place 81 mg into feeding tube daily.     atorvastatin 10 MG tablet  Commonly known as:  LIPITOR  Place 10 mg into feeding tube daily.     citalopram 10 MG tablet  Commonly known as:  CELEXA  Place 20 mg into feeding tube daily.     Cranberry 425 MG Caps  Place 425 mg into feeding tube daily.     Dextromethorphan-Quinidine 20-10 MG Caps  1 capsule by PEG Tube route every 12 (twelve) hours.     feeding supplement (GLUCERNA 1.5 CAL) Liqd  Place 60 mL/hr into feeding tube See admin instructions. 55ml/hr from 6p to 6a     feeding supplement (GLUCERNA 1.2 CAL) Liqd  Place 1,000 mLs into feeding tube continuous.     feeding supplement (GLUCERNA 1.2 CAL) Liqd  Place 1,000 mLs into feeding tube continuous.     feeding supplement (PRO-STAT SUGAR FREE 64) Liqd  Place 30 mLs into feeding tube 2 (two) times daily.     fluconazole 40 MG/ML suspension  Commonly known as:  DIFLUCAN  Place 2.5 mLs (100 mg total) into feeding tube daily.     free water Soln  Place 200 mLs into feeding tube 4 (four) times daily.     gabapentin 250 MG/5ML solution  Commonly known as:  NEURONTIN  Place 2 mLs (100 mg total) into feeding tube every 8 (eight) hours.     Gerhardt's butt cream Crea  Apply 1 application topically 2 (two) times daily.     hydrALAZINE 25 MG tablet  Commonly known as:  APRESOLINE  25 mg by PEG Tube route 2 (two) times daily.     insulin NPH Human 100 UNIT/ML injection  Commonly known as:   HUMULIN N,NOVOLIN N  Inject 0.15 mLs (15 Units total) into the skin every 6 (six) hours.     ipratropium-albuterol 0.5-2.5 (3) MG/3ML Soln  Commonly known as:  DUONEB  Take 3 mLs by nebulization 4 (four) times daily.     lamoTRIgine 25 MG tablet  Commonly known as:  LAMICTAL  Take 50 mg by mouth daily.     levETIRAcetam 100 MG/ML solution  Commonly known as:  KEPPRA  Place 5 mLs (500 mg total) into feeding tube 2 (two) times daily.  loperamide 1 MG/5ML solution  Commonly known as:  IMODIUM  Place 10 mLs (2 mg total) into feeding tube 4 (four) times daily.     multivitamin with minerals tablet  Take 1 tablet by mouth daily.     ondansetron 4 MG tablet  Commonly known as:  ZOFRAN  Take 4 mg by mouth every 8 (eight) hours as needed for nausea or vomiting.     predniSONE 5 MG tablet  Commonly known as:  DELTASONE  Place 5 mg into feeding tube daily.     PROBIOTIC PO  Take 1 capsule by mouth 2 (two) times daily.     promethazine 25 MG/ML injection  Commonly known as:  PHENERGAN  Inject 25 mg into the vein every 6 (six) hours as needed for nausea or vomiting.     sucralfate 1 GM/10ML suspension  Commonly known as:  CARAFATE  Place 1 g into feeding tube every 8 (eight) hours.     tacrolimus 1 MG capsule  Commonly known as:  PROGRAF  3 mg by PEG Tube route 2 (two) times daily.     tacrolimus 0.5 MG capsule  Commonly known as:  PROGRAF  Take 0.5 mg by mouth 2 (two) times daily.     Vancomycin 750 MG/150ML Soln  Commonly known as:  VANCOCIN  Inject 150 mLs (750 mg total) into the vein every other day.     Vitamin D (Ergocalciferol) 50000 UNITS Caps capsule  Commonly known as:  DRISDOL  Place 50,000 Units into feeding tube every 30 (thirty) days.       Allergies  Allergen Reactions  . Amoxicillin Other (See Comments)    ON MAR  . Ampicillin Other (See Comments)    ON MAR  . Latex Other (See Comments)    ON MAR  . Morphine And Related Other (See Comments)     UNK  . Penicillins Other (See Comments)    ON MAR; tolerated Ceftriaxone 8/27-9/1  . Tape Other (See Comments)    ON MAR   Follow-up Information    Follow up with AMIN, SAAD, MD. Schedule an appointment as soon as possible for a visit in 2 weeks.   Specialty:  Internal Medicine   Why:  Schedule follow-up Results still pending will need to follow-up with Dr. Reesa [name], Justin [name] PCP to determine results in 2 weeks   Contact information:   [place] [address] [phone]        The results of significant diagnostics from this hospitalization (including imaging, microbiology, ancillary and laboratory) are listed below for reference.    Significant Diagnostic Studies: Ct Head Wo Contrast  06/08/2015  CLINICAL DATA:  Patient found unresponsive.  Initial encounter. EXAM: CT HEAD WITHOUT CONTRAST TECHNIQUE: Contiguous axial images were obtained from the base of the skull through the vertex without intravenous contrast. COMPARISON:  CT of the head performed 04/05/2015 FINDINGS: There is no evidence of acute infarction, mass lesion, or intra- or extra-axial hemorrhage on CT. Prominence of the ventricles and sulci reflects moderate cortical volume loss. There is significant chronic encephalomalacia involving the left cerebral hemisphere, with ex vacuo dilatation of the left lateral ventricle and third ventricle. Scattered periventricular white matter change likely reflects small vessel ischemic microangiopathy. Cerebellar atrophy is noted. The brainstem and fourth ventricle are within normal limits. The basal ganglia are unremarkable in appearance. The cerebral hemispheres demonstrate grossly normal gray-white differentiation. No mass effect or midline shift is seen. There is no evidence  of fracture; a calvarial defect is noted at the high left parietal calvarium. The visualized portions of the orbits are within normal limits. The paranasal sinuses and mastoid air cells are well-aerated. No  significant soft tissue abnormalities are seen. IMPRESSION: 1. No acute intracranial pathology seen on CT. 2. Significant chronic encephalomalacia involving the left cerebral hemisphere, with ex vacuo dilatation of the left lateral ventricle and third ventricle. 3. Moderate cortical volume loss and scattered small vessel ischemic microangiopathy. Electronically Signed   By: Garald Balding M.D.   On: 06/08/2015 20:16   Dg Chest Port 1 View  06/14/2015  CLINICAL DATA:  Hypoxia EXAM: PORTABLE CHEST 1 VIEW COMPARISON:  June 13, 2015 FINDINGS: Endotracheal tube tip is 3.9 cm above the carina. Central catheter tip is in the superior vena cava. There is no demonstrable pneumothorax. There is mild left base atelectasis. Lungs elsewhere clear. Heart size and pulmonary vascularity are normal. No adenopathy. IMPRESSION: Tube and catheter positions as described without pneumothorax. Mild left base atelectasis. Lungs elsewhere clear. No change in cardiac silhouette. Electronically Signed   By: Lowella Grip III M.D.   On: 06/14/2015 07:45   Dg Chest Port 1 View  06/13/2015  CLINICAL DATA:  Hypoxia EXAM: PORTABLE CHEST 1 VIEW COMPARISON:  June 12, 2015 FINDINGS: Endotracheal tube tip is 3.8 cm above the carina. Nasogastric tube tip and side port are in the stomach. Central catheter tip is in the superior vena cava. No pneumothorax. There is slight left base atelectasis. Lungs elsewhere clear. Heart size and pulmonary vascularity are normal. No adenopathy. No bone lesions. IMPRESSION: Tube and catheter positions as described without pneumothorax. Slight left base atelectasis. Lungs elsewhere clear. Electronically Signed   By: Lowella Grip III M.D.   On: 06/13/2015 08:39   Dg Chest Port 1 View  06/12/2015  CLINICAL DATA:  Endotracheal tube EXAM: PORTABLE CHEST 1 VIEW COMPARISON:  Two days ago FINDINGS: EKG leads create artifact over the chest. Endotracheal tube tip just below the clavicular heads. There  is a left IJ central line, tip at the SVC level. Orogastric tube reaches the stomach at least. Worsening aeration with streaky opacities at the bases, left more than right. The left diaphragm is elevated. No definitive effusion. No edema or air leak. IMPRESSION: 1. Unchanged positioning of tubes and central line. 2. Increased basilar atelectasis. Electronically Signed   By: Monte Fantasia M.D.   On: 06/12/2015 08:14   Dg Chest Port 1 View  06/10/2015  CLINICAL DATA:  Post central line placement EXAM: PORTABLE CHEST 1 VIEW COMPARISON:  06/10/2015 FINDINGS: Left central line tip is in the SVC. No pneumothorax. NG tube tip is in the distal esophagus. Endotracheal tube is 3 cm above the carina. Left lower lobe atelectasis or infiltrate, not significantly changed since prior study. Right lung is clear. Low lung volumes. IMPRESSION: Left central line tip in the SVC. No pneumothorax. NG tube tip is in the distal esophagus. Continued left lower lobe atelectasis or infiltrate, not significantly changed. Electronically Signed   By: Rolm Baptise M.D.   On: 06/10/2015 12:52   Dg Chest Port 1 View  06/10/2015  CLINICAL DATA:  Intubation. EXAM: PORTABLE CHEST 1 VIEW COMPARISON:  06/09/2015. FINDINGS: Endotracheal tube in stable position. The NG tube is been withdrawn, its tip is at the gastroesophageal junction. Advancement suggested. Mild cardiomegaly. Bibasilar atelectasis and infiltrates particular prominent left lung base. These findings are noted. Small left pleural effusion cannot be excluded. Right costophrenic angle not imaged.  No pneumothorax. IMPRESSION: 1. Endotracheal tube in stable position. 2. Interim partial withdrawal of NG tube, its tip is at the gastroesophageal junction. Further advancement of approximately 10 cm suggested. 3. Bibasilar atelectasis and infiltrates, particular prominent in the left lung base . Small left pleural effusion cannot be exclude. These are new findings. Critical Value/emergent  results were called by telephone at the time of interpretation on 06/10/2015 at 7:32 am to nurse Kathlee Nations, who verbally acknowledged these results. Electronically Signed   By: Marcello Moores  Register   On: 06/10/2015 07:34   Dg Chest Port 1 View  06/09/2015  CLINICAL DATA:  Respiratory failure EXAM: PORTABLE CHEST 1 VIEW COMPARISON:  Chest radiograph from one day prior. FINDINGS: Endotracheal tube tip is 3.5 cm above the carina. Enteric tube terminates in proximal stomach. Stable cardiomediastinal silhouette with normal heart size. No pneumothorax. No pleural effusion. Clear lungs, with no focal lung consolidation and no pulmonary edema. IMPRESSION: Well-positioned endotracheal and enteric tubes.  Lungs appear clear. Electronically Signed   By: Ilona Sorrel M.D.   On: 06/09/2015 07:48   Dg Chest Portable 1 View  06/08/2015  CLINICAL DATA:  Unresponsive patient.  Status post intubation. EXAM: PORTABLE CHEST 1 VIEW COMPARISON:  04/08/2015 FINDINGS: There has been an intubation. The endotracheal tube overlies the tracheal air column and terminates at the level of the clavicular heads. Enteric catheter is also seen, tip overlying the expected location of gastric cardia, side hole slightly past the expected location of the gastroesophageal junction. Cardiomediastinal silhouette is normal. Mediastinal contours appear intact. There is no evidence of focal airspace consolidation, pleural effusion or pneumothorax. Lungs are low volume. Osseous structures are without acute abnormality. Soft tissues are grossly normal. IMPRESSION: Status post intubation with satisfactory position radiographically of the endotracheal tube. Enteric catheter with sidehole slightly past the gastroesophageal junction. Low lung volumes. Electronically Signed   By: Fidela Salisbury M.D.   On: 06/08/2015 19:04    Microbiology: Recent Results (from the past 240 hour(s))  Culture, blood (routine x 2)     Status: None   Collection Time: 06/14/15  3:25  PM  Result Value Ref Range Status   Specimen Description BLOOD LEFT HAND  Final   Special Requests IN PEDIATRIC BOTTLE 3CC  Final   Culture  Setup Time   Final    GRAM POSITIVE COCCI IN CLUSTERS AEROBIC BOTTLE ONLY CRITICAL RESULT CALLED TO, READ BACK BY AND VERIFIED WITH: M MILLS 06/15/15 @ 0936 M VESTAL    Culture   Final    STAPHYLOCOCCUS SPECIES (COAGULASE NEGATIVE) SUSCEPTIBILITIES PERFORMED ON PREVIOUS CULTURE WITHIN THE LAST 5 DAYS.    Report Status 06/18/2015 FINAL  Final  Culture, blood (routine x 2)     Status: None   Collection Time: 06/14/15  4:44 PM  Result Value Ref Range Status   Specimen Description BLOOD LEFT HAND  Final   Special Requests IN PEDIATRIC BOTTLE 3CC  Final   Culture  Setup Time   Final    GRAM POSITIVE COCCI IN CLUSTERS AEROBIC BOTTLE ONLY CRITICAL RESULT CALLED TO, READ BACK BY AND VERIFIED WITH: R SARINE RN @2227  06/15/15 MKELLY    Culture STAPHYLOCOCCUS SPECIES (COAGULASE NEGATIVE)  Final   Report Status 06/18/2015 FINAL  Final   Organism ID, Bacteria STAPHYLOCOCCUS SPECIES (COAGULASE NEGATIVE)  Final      Susceptibility   Staphylococcus species (coagulase negative) - MIC*    CIPROFLOXACIN >=8 RESISTANT Resistant     ERYTHROMYCIN >=8 RESISTANT Resistant  GENTAMICIN <=0.5 SENSITIVE Sensitive     OXACILLIN >=4 RESISTANT Resistant     TETRACYCLINE <=1 SENSITIVE Sensitive     VANCOMYCIN <=0.5 SENSITIVE Sensitive     TRIMETH/SULFA <=10 SENSITIVE Sensitive     CLINDAMYCIN >=8 RESISTANT Resistant     RIFAMPIN <=0.5 SENSITIVE Sensitive     Inducible Clindamycin NEGATIVE Sensitive     * STAPHYLOCOCCUS SPECIES (COAGULASE NEGATIVE)  Gastrointestinal Panel by PCR , Stool     Status: None   Collection Time: 06/18/15  9:52 PM  Result Value Ref Range Status   Campylobacter species NOT DETECTED NOT DETECTED Final   Plesimonas shigelloides NOT DETECTED NOT DETECTED Final   Salmonella species NOT DETECTED NOT DETECTED Final   Yersinia enterocolitica  NOT DETECTED NOT DETECTED Final   Vibrio species NOT DETECTED NOT DETECTED Final   Vibrio cholerae NOT DETECTED NOT DETECTED Final   Enteroaggregative E coli (EAEC) NOT DETECTED NOT DETECTED Final   Enteropathogenic E coli (EPEC) NOT DETECTED NOT DETECTED Final   Enterotoxigenic E coli (ETEC) NOT DETECTED NOT DETECTED Final   Shiga like toxin producing E coli (STEC) NOT DETECTED NOT DETECTED Final   E. coli O157 NOT DETECTED NOT DETECTED Final   Shigella/Enteroinvasive E coli (EIEC) NOT DETECTED NOT DETECTED Final   Cryptosporidium NOT DETECTED NOT DETECTED Final   Cyclospora cayetanensis NOT DETECTED NOT DETECTED Final   Entamoeba histolytica NOT DETECTED NOT DETECTED Final   Giardia lamblia NOT DETECTED NOT DETECTED Final   Adenovirus F40/41 NOT DETECTED NOT DETECTED Final   Astrovirus NOT DETECTED NOT DETECTED Final   Norovirus GI/GII NOT DETECTED NOT DETECTED Final   Rotavirus A NOT DETECTED NOT DETECTED Final   Sapovirus (I, II, IV, and V) NOT DETECTED NOT DETECTED Final  Culture, blood (routine x 2)     Status: None (Preliminary result)   Collection Time: 06/19/15  1:28 AM  Result Value Ref Range Status   Specimen Description BLOOD LEFT HAND  Final   Special Requests BOTTLES DRAWN AEROBIC AND ANAEROBIC 5ML  Final   Culture NO GROWTH 3 DAYS  Final   Report Status PENDING  Incomplete  Culture, blood (routine x 2)     Status: None (Preliminary result)   Collection Time: 06/19/15  1:38 AM  Result Value Ref Range Status   Specimen Description BLOOD LEFT HAND  Final   Special Requests AEROBIC BOTTLE ONLY 6ML  Final   Culture NO GROWTH 3 DAYS  Final   Report Status PENDING  Incomplete  Culture, Urine     Status: None   Collection Time: 06/19/15  6:03 AM  Result Value Ref Range Status   Specimen Description URINE, RANDOM  Final   Special Requests NONE  Final   Culture >=100,000 COLONIES/mL YEAST  Final   Report Status 06/20/2015 FINAL  Final     Labs: Basic Metabolic  Panel:  Recent Labs Lab 06/17/15 0555 06/18/15 0442 06/18/15 0443 06/19/15 0138 06/20/15 0604 06/21/15 0220 06/22/15 0446 06/23/15 0440  NA 143  --  141 138 139 138 137 139  K 4.3  --  3.9 4.4 4.7 4.5 4.9 4.8  CL 111  --  107 105 103 103 99* 101  CO2 28  --  28 28 32 31 32 31  GLUCOSE 221*  --  153* 207* 133* 101* 155* 112*  BUN 35*  --  31* 28* 27* 30* 35* 38*  CREATININE 1.44*  --  1.28* 1.15 1.25* 1.29* 1.37* 1.35*  CALCIUM 9.9  --  9.7 9.7 10.2 10.0 10.4* 10.5*  MG 2.3 2.2  --  2.3 2.5* 2.2 2.4  --   PHOS 2.3*  --  2.2*  --   --   --   --   --    Liver Function Tests:  Recent Labs Lab 06/19/15 0138 06/20/15 0604 06/21/15 0220 06/22/15 0446 06/23/15 0440  AST 20 23 19 22 21   ALT 20 17 16* 15* 13*  ALKPHOS 78 69 77 79 69  BILITOT 0.5 0.6 0.5 0.5 0.5  PROT 6.5 6.7 5.8* 6.6 6.4*  ALBUMIN 1.9* 2.0* 2.0* 2.3* 2.2*   No results for input(s): LIPASE, AMYLASE in the last 168 hours. No results for input(s): AMMONIA in the last 168 hours. CBC:  Recent Labs Lab 06/18/15 0442 06/19/15 0138 06/20/15 0604 06/21/15 0220 06/22/15 0446 06/23/15 0440  WBC 7.3 7.5 7.6 8.4 6.4 5.7  NEUTROABS 5.3 5.6 5.4 6.5 3.8  --   HGB 11.8* 12.1* 12.5* 11.9* 12.3* 11.8*  HCT 39.9 40.6 41.2 36.5* 39.9 38.8*  MCV 89.5 88.3 88.8 86.5 88.9 89.4  PLT 176 192 214 288 304 289   Cardiac Enzymes:  Recent Labs Lab 06/18/15 2222 06/19/15 0138 06/19/15 0728  TROPONINI 0.04* 0.08* 0.05*   BNP: BNP (last 3 results) No results for input(s): BNP in the last 8760 hours.  ProBNP (last 3 results) No results for input(s): PROBNP in the last 8760 hours.  CBG:  Recent Labs Lab 06/22/15 2141 06/22/15 2349 06/23/15 0524 06/23/15 1017 06/23/15 1206  GLUCAP 180* 169* 93 117* 138*       Signed:  Dia Crawford, MD [facility] [phone] pager

## 2015-06-23 NOTE — Discharge Planning (Signed)
Patient to return to [facility]. CSW confirmed patient's return with patient's daughter, April.  Facility: [facility] RN report number: [phone] Transportation: Greig Castilla, [facility]: [id number] Surgical: [phone]

## 2015-06-23 NOTE — Discharge Planning (Signed)
Patient discharged to SNF in stable condition.  

## 2015-06-23 NOTE — Clinical Social Work Note (Signed)
CSW attempted to speak with patient's daughter, April, regarding discharge planning for patient. CSW left voice message with patient's daughter, April.  Lubertha Sayres, [facility]: [phone] Surgical: [phone]

## 2015-06-23 NOTE — Progress Notes (Signed)
Clarified with Dr. Sherral Hammers that pt is to go with Foley and Flexiseal in place.

## 2015-06-24 LAB — CULTURE, BLOOD (ROUTINE X 2)
Culture: NO GROWTH
Culture: NO GROWTH

## 2015-06-24 LAB — OSMOLALITY, STOOL

## 2015-06-27 LAB — SODIUM, STOOL: Sodium, Stl: 44 mmol/L

## 2015-06-27 LAB — POTASSIUM, STOOL: Potassium, Stl: 90 mmol/L

## 2015-07-07 LAB — CHLORIDE, FECAL: Chloride, Stl: 13 mEq/L

## 2015-07-07 LAB — FECAL FAT, QUANTITATIVE: WEIGHT OF COLLECTION: 9 g

## 2015-08-13 ENCOUNTER — Encounter: Payer: Self-pay | Admitting: Gastroenterology

## 2015-08-15 ENCOUNTER — Emergency Department (HOSPITAL_COMMUNITY): Payer: Medicare Other

## 2015-08-15 ENCOUNTER — Inpatient Hospital Stay (HOSPITAL_COMMUNITY)
Admission: EM | Admit: 2015-08-15 | Discharge: 2015-08-21 | DRG: 640 | Disposition: A | Discharge status: Skilled Nursing Facility | Payer: Medicare Other | Attending: Family Medicine | Admitting: Family Medicine

## 2015-08-15 ENCOUNTER — Encounter (HOSPITAL_COMMUNITY): Payer: Self-pay

## 2015-08-15 DIAGNOSIS — I10 Essential (primary) hypertension: Secondary | ICD-10-CM | POA: Diagnosis not present

## 2015-08-15 DIAGNOSIS — G9341 Metabolic encephalopathy: Secondary | ICD-10-CM | POA: Diagnosis present

## 2015-08-15 DIAGNOSIS — R4182 Altered mental status, unspecified: Secondary | ICD-10-CM | POA: Diagnosis present

## 2015-08-15 DIAGNOSIS — Z794 Long term (current) use of insulin: Secondary | ICD-10-CM

## 2015-08-15 DIAGNOSIS — I959 Hypotension, unspecified: Secondary | ICD-10-CM | POA: Diagnosis present

## 2015-08-15 DIAGNOSIS — E785 Hyperlipidemia, unspecified: Secondary | ICD-10-CM | POA: Diagnosis present

## 2015-08-15 DIAGNOSIS — I130 Hypertensive heart and chronic kidney disease with heart failure and stage 1 through stage 4 chronic kidney disease, or unspecified chronic kidney disease: Secondary | ICD-10-CM | POA: Diagnosis present

## 2015-08-15 DIAGNOSIS — F482 Pseudobulbar affect: Secondary | ICD-10-CM | POA: Diagnosis present

## 2015-08-15 DIAGNOSIS — J9601 Acute respiratory failure with hypoxia: Secondary | ICD-10-CM | POA: Diagnosis present

## 2015-08-15 DIAGNOSIS — Z91040 Latex allergy status: Secondary | ICD-10-CM | POA: Diagnosis not present

## 2015-08-15 DIAGNOSIS — Z881 Allergy status to other antibiotic agents status: Secondary | ICD-10-CM | POA: Diagnosis not present

## 2015-08-15 DIAGNOSIS — E870 Hyperosmolality and hypernatremia: Principal | ICD-10-CM | POA: Diagnosis present

## 2015-08-15 DIAGNOSIS — E861 Hypovolemia: Secondary | ICD-10-CM | POA: Diagnosis present

## 2015-08-15 DIAGNOSIS — Z7982 Long term (current) use of aspirin: Secondary | ICD-10-CM | POA: Diagnosis not present

## 2015-08-15 DIAGNOSIS — Z7952 Long term (current) use of systemic steroids: Secondary | ICD-10-CM | POA: Diagnosis not present

## 2015-08-15 DIAGNOSIS — L89013 Pressure ulcer of right elbow, stage 3: Secondary | ICD-10-CM | POA: Diagnosis present

## 2015-08-15 DIAGNOSIS — E1165 Type 2 diabetes mellitus with hyperglycemia: Secondary | ICD-10-CM | POA: Diagnosis present

## 2015-08-15 DIAGNOSIS — E1151 Type 2 diabetes mellitus with diabetic peripheral angiopathy without gangrene: Secondary | ICD-10-CM | POA: Diagnosis present

## 2015-08-15 DIAGNOSIS — L89510 Pressure ulcer of right ankle, unstageable: Secondary | ICD-10-CM | POA: Diagnosis present

## 2015-08-15 DIAGNOSIS — N4822 Cellulitis of corpus cavernosum and penis: Secondary | ICD-10-CM | POA: Diagnosis not present

## 2015-08-15 DIAGNOSIS — L89212 Pressure ulcer of right hip, stage 2: Secondary | ICD-10-CM | POA: Diagnosis present

## 2015-08-15 DIAGNOSIS — I629 Nontraumatic intracranial hemorrhage, unspecified: Secondary | ICD-10-CM | POA: Diagnosis not present

## 2015-08-15 DIAGNOSIS — E875 Hyperkalemia: Secondary | ICD-10-CM | POA: Diagnosis present

## 2015-08-15 DIAGNOSIS — R569 Unspecified convulsions: Secondary | ICD-10-CM

## 2015-08-15 DIAGNOSIS — D6489 Other specified anemias: Secondary | ICD-10-CM | POA: Diagnosis present

## 2015-08-15 DIAGNOSIS — N182 Chronic kidney disease, stage 2 (mild): Secondary | ICD-10-CM | POA: Diagnosis present

## 2015-08-15 DIAGNOSIS — E1122 Type 2 diabetes mellitus with diabetic chronic kidney disease: Secondary | ICD-10-CM | POA: Diagnosis present

## 2015-08-15 DIAGNOSIS — Z6830 Body mass index (BMI) 30.0-30.9, adult: Secondary | ICD-10-CM | POA: Diagnosis not present

## 2015-08-15 DIAGNOSIS — R4189 Other symptoms and signs involving cognitive functions and awareness: Secondary | ICD-10-CM | POA: Diagnosis present

## 2015-08-15 DIAGNOSIS — E669 Obesity, unspecified: Secondary | ICD-10-CM | POA: Diagnosis present

## 2015-08-15 DIAGNOSIS — Z931 Gastrostomy status: Secondary | ICD-10-CM | POA: Diagnosis not present

## 2015-08-15 DIAGNOSIS — N170 Acute kidney failure with tubular necrosis: Secondary | ICD-10-CM | POA: Diagnosis not present

## 2015-08-15 DIAGNOSIS — R627 Adult failure to thrive: Secondary | ICD-10-CM | POA: Diagnosis present

## 2015-08-15 DIAGNOSIS — Z95818 Presence of other cardiac implants and grafts: Secondary | ICD-10-CM

## 2015-08-15 DIAGNOSIS — I5022 Chronic systolic (congestive) heart failure: Secondary | ICD-10-CM | POA: Diagnosis present

## 2015-08-15 DIAGNOSIS — E860 Dehydration: Secondary | ICD-10-CM | POA: Diagnosis present

## 2015-08-15 DIAGNOSIS — Z79899 Other long term (current) drug therapy: Secondary | ICD-10-CM | POA: Diagnosis not present

## 2015-08-15 DIAGNOSIS — C8300 Small cell B-cell lymphoma, unspecified site: Secondary | ICD-10-CM | POA: Diagnosis present

## 2015-08-15 DIAGNOSIS — Z940 Kidney transplant status: Secondary | ICD-10-CM

## 2015-08-15 DIAGNOSIS — Z91048 Other nonmedicinal substance allergy status: Secondary | ICD-10-CM

## 2015-08-15 DIAGNOSIS — Z880 Allergy status to penicillin: Secondary | ICD-10-CM

## 2015-08-15 DIAGNOSIS — Z923 Personal history of irradiation: Secondary | ICD-10-CM | POA: Diagnosis not present

## 2015-08-15 DIAGNOSIS — Z87891 Personal history of nicotine dependence: Secondary | ICD-10-CM | POA: Diagnosis not present

## 2015-08-15 DIAGNOSIS — E872 Acidosis: Secondary | ICD-10-CM | POA: Diagnosis present

## 2015-08-15 DIAGNOSIS — N179 Acute kidney failure, unspecified: Secondary | ICD-10-CM | POA: Insufficient documentation

## 2015-08-15 DIAGNOSIS — K219 Gastro-esophageal reflux disease without esophagitis: Secondary | ICD-10-CM | POA: Diagnosis present

## 2015-08-15 DIAGNOSIS — E876 Hypokalemia: Secondary | ICD-10-CM | POA: Diagnosis not present

## 2015-08-15 DIAGNOSIS — I69151 Hemiplegia and hemiparesis following nontraumatic intracerebral hemorrhage affecting right dominant side: Secondary | ICD-10-CM

## 2015-08-15 DIAGNOSIS — L89610 Pressure ulcer of right heel, unstageable: Secondary | ICD-10-CM | POA: Diagnosis present

## 2015-08-15 DIAGNOSIS — L89159 Pressure ulcer of sacral region, unspecified stage: Secondary | ICD-10-CM | POA: Diagnosis present

## 2015-08-15 DIAGNOSIS — G9340 Encephalopathy, unspecified: Secondary | ICD-10-CM

## 2015-08-15 DIAGNOSIS — L89151 Pressure ulcer of sacral region, stage 1: Secondary | ICD-10-CM | POA: Diagnosis present

## 2015-08-15 DIAGNOSIS — Z66 Do not resuscitate: Secondary | ICD-10-CM | POA: Diagnosis present

## 2015-08-15 DIAGNOSIS — T451X5S Adverse effect of antineoplastic and immunosuppressive drugs, sequela: Secondary | ICD-10-CM | POA: Diagnosis not present

## 2015-08-15 DIAGNOSIS — Z885 Allergy status to narcotic agent status: Secondary | ICD-10-CM | POA: Diagnosis not present

## 2015-08-15 DIAGNOSIS — J9690 Respiratory failure, unspecified, unspecified whether with hypoxia or hypercapnia: Secondary | ICD-10-CM | POA: Diagnosis present

## 2015-08-15 DIAGNOSIS — K529 Noninfective gastroenteritis and colitis, unspecified: Secondary | ICD-10-CM | POA: Diagnosis present

## 2015-08-15 LAB — COMPREHENSIVE METABOLIC PANEL
ALT: 30 U/L (ref 17–63)
AST: 50 U/L — ABNORMAL HIGH (ref 15–41)
Albumin: 2.3 g/dL — ABNORMAL LOW (ref 3.5–5.0)
Alkaline Phosphatase: 67 U/L (ref 38–126)
Anion gap: 12 (ref 5–15)
BUN: 110 mg/dL — ABNORMAL HIGH (ref 6–20)
CO2: 32 mmol/L (ref 22–32)
Calcium: 11.5 mg/dL — ABNORMAL HIGH (ref 8.9–10.3)
Chloride: 120 mmol/L — ABNORMAL HIGH (ref 101–111)
Creatinine, Ser: 2.91 mg/dL — ABNORMAL HIGH (ref 0.61–1.24)
GFR calc Af Amer: 25 mL/min — ABNORMAL LOW (ref >60)
GFR calc non Af Amer: 22 mL/min — ABNORMAL LOW (ref >60)
Glucose, Bld: 209 mg/dL — ABNORMAL HIGH (ref 65–99)
Potassium: 6.5 mmol/L (ref 3.5–5.1)
Sodium: 164 mmol/L (ref 135–145)
Total Bilirubin: 1 mg/dL (ref 0.3–1.2)
Total Protein: 7.9 g/dL (ref 6.5–8.1)

## 2015-08-15 LAB — I-STAT CG4 LACTIC ACID, ED
Lactic Acid, Venous: 2.74 mmol/L (ref 0.5–2.0)
Lactic Acid, Venous: 1.98 mmol/L (ref 0.5–2.0)

## 2015-08-15 LAB — I-STAT VENOUS BLOOD GAS, ED
Acid-Base Excess: 12 mmol/L — ABNORMAL HIGH (ref 0.0–2.0)
Bicarbonate: 42.6 mEq/L — ABNORMAL HIGH (ref 20.0–24.0)
O2 Saturation: 50 %
TCO2: 45 mmol/L (ref 0–100)
pCO2, Ven: 79.6 mmHg (ref 45.0–50.0)
pH, Ven: 7.337 — ABNORMAL HIGH (ref 7.250–7.300)
pO2, Ven: 31 mmHg (ref 30.0–45.0)

## 2015-08-15 LAB — BASIC METABOLIC PANEL
ANION GAP: 9 (ref 5–15)
ANION GAP: 9 (ref 5–15)
BUN: 107 mg/dL — AB (ref 6–20)
BUN: 107 mg/dL — AB (ref 6–20)
CALCIUM: 10.4 mg/dL — AB (ref 8.9–10.3)
CALCIUM: 10.3 mg/dL (ref 8.9–10.3)
CO2: 33 mmol/L — AB (ref 22–32)
CO2: 34 mmol/L — AB (ref 22–32)
Chloride: 120 mmol/L — ABNORMAL HIGH (ref 101–111)
Chloride: 121 mmol/L — ABNORMAL HIGH (ref 101–111)
Creatinine, Ser: 2.85 mg/dL — ABNORMAL HIGH (ref 0.61–1.24)
Creatinine, Ser: 2.76 mg/dL — ABNORMAL HIGH (ref 0.61–1.24)
GFR calc Af Amer: 26 mL/min — ABNORMAL LOW (ref >60)
GFR calc Af Amer: 27 mL/min — ABNORMAL LOW (ref >60)
GFR calc non Af Amer: 22 mL/min — ABNORMAL LOW (ref >60)
GFR calc non Af Amer: 23 mL/min — ABNORMAL LOW (ref >60)
GLUCOSE: 232 mg/dL — AB (ref 65–99)
GLUCOSE: 316 mg/dL — AB (ref 65–99)
POTASSIUM: 5 mmol/L (ref 3.5–5.1)
Potassium: 5.3 mmol/L — ABNORMAL HIGH (ref 3.5–5.1)
Sodium: 162 mmol/L (ref 135–145)
Sodium: 164 mmol/L (ref 135–145)

## 2015-08-15 LAB — URINE MICROSCOPIC-ADD ON
RBC / HPF: NONE SEEN RBC/hpf (ref 0–5)
Squamous Epithelial / LPF: NONE SEEN

## 2015-08-15 LAB — I-STAT CHEM 8, ED
BUN: 123 mg/dL — ABNORMAL HIGH (ref 6–20)
Calcium, Ion: 1.38 mmol/L — ABNORMAL HIGH (ref 1.13–1.30)
Chloride: 123 mmol/L — ABNORMAL HIGH (ref 101–111)
Creatinine, Ser: 2.6 mg/dL — ABNORMAL HIGH (ref 0.61–1.24)
Glucose, Bld: 208 mg/dL — ABNORMAL HIGH (ref 65–99)
HCT: 43 % (ref 39.0–52.0)
Hemoglobin: 14.6 g/dL (ref 13.0–17.0)
Potassium: 5.3 mmol/L — ABNORMAL HIGH (ref 3.5–5.1)
Sodium: 165 mmol/L (ref 135–145)
TCO2: 42 mmol/L (ref 0–100)

## 2015-08-15 LAB — URINALYSIS, ROUTINE W REFLEX MICROSCOPIC
Bilirubin Urine: NEGATIVE
Glucose, UA: >1000 mg/dL — AB
Ketones, ur: NEGATIVE mg/dL
NITRITE: NEGATIVE
PH: 6 (ref 5.0–8.0)
Protein, ur: 30 mg/dL — AB
SPECIFIC GRAVITY, URINE: 1.02 (ref 1.005–1.030)

## 2015-08-15 LAB — AMYLASE: AMYLASE: 43 U/L (ref 28–100)

## 2015-08-15 LAB — CBC
HEMATOCRIT: 48.3 % (ref 39.0–52.0)
HEMOGLOBIN: 13 g/dL (ref 13.0–17.0)
MCH: 25.9 pg — AB (ref 26.0–34.0)
MCHC: 26.9 g/dL — ABNORMAL LOW (ref 30.0–36.0)
MCV: 96.4 fL (ref 78.0–100.0)
PLATELETS: 148 10*3/uL — AB (ref 150–400)
RBC: 5.01 MIL/uL (ref 4.22–5.81)
RDW: 16.8 % — ABNORMAL HIGH (ref 11.5–15.5)
WBC: 7.4 10*3/uL (ref 4.0–10.5)

## 2015-08-15 LAB — CBC WITH DIFFERENTIAL/PLATELET
Basophils Absolute: 0 10*3/uL (ref 0.0–0.1)
Basophils Relative: 0 %
EOS PCT: 0 %
Eosinophils Absolute: 0 10*3/uL (ref 0.0–0.7)
HEMATOCRIT: 52.9 % — AB (ref 39.0–52.0)
Hemoglobin: 14.5 g/dL (ref 13.0–17.0)
LYMPHS ABS: 1.8 10*3/uL (ref 0.7–4.0)
Lymphocytes Relative: 22 %
MCH: 26.4 pg (ref 26.0–34.0)
MCHC: 27.4 g/dL — ABNORMAL LOW (ref 30.0–36.0)
MCV: 96.2 fL (ref 78.0–100.0)
MONOS PCT: 7 %
Monocytes Absolute: 0.6 10*3/uL (ref 0.1–1.0)
Neutro Abs: 5.6 10*3/uL (ref 1.7–7.7)
Neutrophils Relative %: 71 %
Platelets: 147 10*3/uL — ABNORMAL LOW (ref 150–400)
RBC: 5.5 MIL/uL (ref 4.22–5.81)
RDW: 16.7 % — AB (ref 11.5–15.5)
WBC: 8 10*3/uL (ref 4.0–10.5)

## 2015-08-15 LAB — CBG MONITORING, ED
Glucose-Capillary: 189 mg/dL — ABNORMAL HIGH (ref 65–99)
Glucose-Capillary: 197 mg/dL — ABNORMAL HIGH (ref 65–99)

## 2015-08-15 LAB — MRSA PCR SCREENING: MRSA by PCR: POSITIVE — AB

## 2015-08-15 LAB — GLUCOSE, CAPILLARY
Glucose-Capillary: 249 mg/dL — ABNORMAL HIGH (ref 65–99)
Glucose-Capillary: 298 mg/dL — ABNORMAL HIGH (ref 65–99)

## 2015-08-15 LAB — LIPASE, BLOOD: Lipase: 29 U/L (ref 11–51)

## 2015-08-15 LAB — PHOSPHORUS: Phosphorus: 3.1 mg/dL (ref 2.5–4.6)

## 2015-08-15 LAB — STREP PNEUMONIAE URINARY ANTIGEN: STREP PNEUMO URINARY ANTIGEN: NEGATIVE

## 2015-08-15 LAB — APTT: APTT: 32 s (ref 24–37)

## 2015-08-15 LAB — TROPONIN I: TROPONIN I: 0.23 ng/mL — AB (ref <0.031)

## 2015-08-15 LAB — PROTIME-INR
INR: 1.22 (ref 0.00–1.49)
Prothrombin Time: 15.5 seconds — ABNORMAL HIGH (ref 11.6–15.2)

## 2015-08-15 LAB — MAGNESIUM: Magnesium: 3 mg/dL — ABNORMAL HIGH (ref 1.7–2.4)

## 2015-08-15 LAB — LACTIC ACID, PLASMA: Lactic Acid, Venous: 2 mmol/L (ref 0.5–2.0)

## 2015-08-15 LAB — PROCALCITONIN: Procalcitonin: 0.19 ng/mL

## 2015-08-15 MED ORDER — HYDROCORTISONE NA SUCCINATE PF 100 MG IJ SOLR
50.0000 mg | Freq: Three times a day (TID) | INTRAMUSCULAR | Status: DC
  Administered 2015-08-15 – 2015-08-17 (×6): 50 mg via INTRAVENOUS
  Filled 2015-08-15: qty 1
  Filled 2015-08-15 (×2): qty 2
  Filled 2015-08-15 (×2): qty 1
  Filled 2015-08-15 (×2): qty 2

## 2015-08-15 MED ORDER — DEXTROSE 5 % IV SOLN
2.0000 g | Freq: Two times a day (BID) | INTRAVENOUS | Status: DC
  Administered 2015-08-16 – 2015-08-19 (×8): 2 g via INTRAVENOUS
  Filled 2015-08-15 (×9): qty 2

## 2015-08-15 MED ORDER — CETYLPYRIDINIUM CHLORIDE 0.05 % MT LIQD
7.0000 mL | Freq: Two times a day (BID) | OROMUCOSAL | Status: DC
  Administered 2015-08-15 – 2015-08-21 (×10): 7 mL via OROMUCOSAL

## 2015-08-15 MED ORDER — PANTOPRAZOLE SODIUM 40 MG IV SOLR
40.0000 mg | Freq: Every day | INTRAVENOUS | Status: DC
  Administered 2015-08-15: 40 mg via INTRAVENOUS
  Filled 2015-08-15 (×2): qty 40

## 2015-08-15 MED ORDER — ACYCLOVIR 200 MG/5ML PO SUSP
200.0000 mg | Freq: Two times a day (BID) | ORAL | Status: DC
  Administered 2015-08-15 – 2015-08-21 (×12): 200 mg
  Filled 2015-08-15 (×13): qty 5

## 2015-08-15 MED ORDER — SODIUM CHLORIDE 0.9 % IV BOLUS (SEPSIS)
250.0000 mL | Freq: Once | INTRAVENOUS | Status: AC
  Administered 2015-08-15: 250 mL via INTRAVENOUS

## 2015-08-15 MED ORDER — INSULIN ASPART 100 UNIT/ML ~~LOC~~ SOLN
0.0000 [IU] | SUBCUTANEOUS | Status: DC
  Administered 2015-08-15: 4 [IU] via SUBCUTANEOUS
  Administered 2015-08-15: 11 [IU] via SUBCUTANEOUS
  Administered 2015-08-16: 4 [IU] via SUBCUTANEOUS
  Administered 2015-08-16: 3 [IU] via SUBCUTANEOUS
  Administered 2015-08-16: 11 [IU] via SUBCUTANEOUS
  Administered 2015-08-16 (×2): 7 [IU] via SUBCUTANEOUS
  Administered 2015-08-16 – 2015-08-17 (×2): 4 [IU] via SUBCUTANEOUS
  Administered 2015-08-17: 7 [IU] via SUBCUTANEOUS
  Administered 2015-08-17: 3 [IU] via SUBCUTANEOUS
  Administered 2015-08-17: 7 [IU] via SUBCUTANEOUS
  Administered 2015-08-17: 4 [IU] via SUBCUTANEOUS
  Administered 2015-08-17: 3 [IU] via SUBCUTANEOUS
  Administered 2015-08-18: 4 [IU] via SUBCUTANEOUS
  Administered 2015-08-18: 3 [IU] via SUBCUTANEOUS
  Administered 2015-08-19: 11 [IU] via SUBCUTANEOUS
  Administered 2015-08-19: 7 [IU] via SUBCUTANEOUS
  Administered 2015-08-19: 11 [IU] via SUBCUTANEOUS
  Administered 2015-08-19: 3 [IU] via SUBCUTANEOUS
  Administered 2015-08-20 – 2015-08-21 (×6): 4 [IU] via SUBCUTANEOUS
  Filled 2015-08-15: qty 1

## 2015-08-15 MED ORDER — FREE WATER
200.0000 mL | Status: DC
  Administered 2015-08-15 – 2015-08-21 (×33): 200 mL

## 2015-08-15 MED ORDER — SODIUM CHLORIDE 0.45 % IV SOLN
INTRAVENOUS | Status: DC
  Administered 2015-08-15 – 2015-08-16 (×2): via INTRAVENOUS

## 2015-08-15 MED ORDER — SODIUM CHLORIDE 0.9 % IV SOLN: INTRAVENOUS | Status: DC

## 2015-08-15 MED ORDER — DEXTROSE 5 % IV SOLN
2.0000 g | Freq: Three times a day (TID) | INTRAVENOUS | Status: DC
  Administered 2015-08-15: 2 g via INTRAVENOUS
  Filled 2015-08-15: qty 2

## 2015-08-15 MED ORDER — VANCOMYCIN HCL 10 G IV SOLR
1500.0000 mg | Freq: Once | INTRAVENOUS | Status: AC
  Administered 2015-08-15: 1500 mg via INTRAVENOUS
  Filled 2015-08-15: qty 1500

## 2015-08-15 MED ORDER — INSULIN GLARGINE 100 UNIT/ML ~~LOC~~ SOLN
10.0000 [IU] | Freq: Every day | SUBCUTANEOUS | Status: DC
  Administered 2015-08-15 – 2015-08-16 (×2): 10 [IU] via SUBCUTANEOUS
  Filled 2015-08-15 (×3): qty 0.1

## 2015-08-15 MED ORDER — HYDROCORTISONE NA SUCCINATE PF 100 MG IJ SOLR
100.0000 mg | Freq: Once | INTRAMUSCULAR | Status: AC
  Administered 2015-08-15: 100 mg via INTRAVENOUS
  Filled 2015-08-15: qty 2

## 2015-08-15 MED ORDER — VANCOMYCIN HCL 10 G IV SOLR
1250.0000 mg | INTRAVENOUS | Status: DC
  Administered 2015-08-16 – 2015-08-17 (×2): 1250 mg via INTRAVENOUS
  Filled 2015-08-15 (×4): qty 1250

## 2015-08-15 MED ORDER — SODIUM CHLORIDE 0.9 % IV BOLUS (SEPSIS)
1000.0000 mL | Freq: Once | INTRAVENOUS | Status: AC
  Administered 2015-08-15: 1000 mL via INTRAVENOUS

## 2015-08-15 NOTE — Progress Notes (Signed)
[facility] Progress Note Patient Name: WHIT TONES DOB: 13-Jan-1956 MRN: [id number]   Date of Service  08/15/2015  HPI/Events of Note  Hypernatremia - Na+ = 164. Already on 0.45 NaCl IV fluid.   eICU Interventions  Will order free water 200 mL via PEG tube Q 4 hours.      Intervention Category Major Interventions: Electrolyte abnormality - evaluation and management  Sommer,Steven Eugene 08/15/2015, 10:34 PM

## 2015-08-15 NOTE — Progress Notes (Addendum)
Pharmacy Antibiotic Note  Justin [name] is a 60 y.o. male admitted on 08/15/2015 with pneumonia.  Pharmacy has been consulted for vancomycin dosing. Patient with AMS and wet cough.   Last SCr was 1.35 in 06/2015 which appears to be around baseline for patient.  Plan: -vancomycin 1500mg  IV x1 as loading dose, then 1250mg  IOV q24h -goal trough 15-59mcg/mL -ceftazidime 2g IV q12h  Weight: 190 lb (86.183 kg)  Temp (24hrs), Avg:99.5 F (37.5 C), Min:99.5 F (37.5 C), Max:99.5 F (37.5 C)  No results for input(s): WBC, CREATININE, LATICACIDVEN, VANCOTROUGH, VANCOPEAK, VANCORANDOM, GENTTROUGH, GENTPEAK, GENTRANDOM, TOBRATROUGH, TOBRAPEAK, TOBRARND, AMIKACINPEAK, AMIKACINTROU, AMIKACIN in the last 168 hours.  CrCl cannot be calculated (Patient has no serum creatinine result on file.).    Allergies  Allergen Reactions  . Amoxicillin Other (See Comments)    ON MAR  . Ampicillin Other (See Comments)    ON MAR  . Latex Other (See Comments)    ON MAR  . Morphine And Related Other (See Comments)    UNK  . Penicillins Other (See Comments)    ON MAR; tolerated Ceftriaxone 8/27-9/1  . Tape Other (See Comments)    ON MAR    Antimicrobials this admission: vanc 2/11 >>  ceftaz 2/11 >>  Dose adjustments this admission: N/A  Microbiology results: 2/11 BCx: sent 2/11 UCx: sent    Thank you for allowing pharmacy to be a part of this patient's care.  Datha Kissinger D. Winfred Iiams, PharmD, BCPS Clinical Pharmacist Pager: [phone] 08/15/2015 12:37 PM

## 2015-08-15 NOTE — ED Notes (Signed)
Discontinued bipap per MD.

## 2015-08-15 NOTE — ED Notes (Signed)
Pt. BIB EMS for evaluation following new onset ALOC yesterday at 70. Pt. Lives at [facility]. Pt. Daughter was contacted yesterday regarding pt. Status. Pt. Has wet cough, RA sats 70-80%. At baseline pt. Does respond yes/no and moves around in wheelchair independently.

## 2015-08-15 NOTE — ED Provider Notes (Signed)
CSN: [id number]     Arrival date & time 08/15/15  1134 History   First MD Initiated Contact with Patient 08/15/15 1154     Chief Complaint  Patient presents with  . Altered Mental Status      The history is provided by the EMS personnel. No language interpreter was used.  Justin [name] is a 60 y.o. male who presents to the Emergency Department complaining of AMS.  Per EMS patient with altered level of consciousness since 11:15 yesterday. He is a resident at [facility] care. He was started on IV antibiotics yesterday for a possible infection. At his baseline he is minimally verbal and will answer yes or no and he can move independently in a wheelchair. Level V caveat due to altered mental status.  Past Medical History  Diagnosis Date  . Hypertension   . Dyslipidemia   . Carpal tunnel syndrome   . A-V fistula ([place])   . Anemia   . Hemorrhoids   . Hyperparathyroidism   . Intracranial hemorrhage ([place])   . Hypotension   . Hyperkalemia   . Kidney disease   . Sepsis(995.91)   . GI bleeding   . DVT (deep venous thrombosis) ([place])   . PVD (peripheral vascular disease) ([place])   . DM (diabetes mellitus) ([place])   . Cellulitis   . IV infiltration     Right upper extremity  . PPD positive   . 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 Hypokinesis     EF of 60% with mild anterior hypokinesis, minimal coronary artery disease on catheterization in December of 2007  . Gout   . Renal transplant, status post 10/19/2014  . Pseudobulbar affect   . GERD (gastroesophageal reflux disease)    Past Surgical History  Procedure Laterality Date  . Kidney transplant      History of renal transplant maintained on chronic immunosuppressive therapy  . Av fistula placement      Right arm  . Peripherally inserted central catheter insertion      line placement  . Amputation    . Gastrostomy    . 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 Brain  biopsy    . #6 shiley cuff      Less trache   Family History  Problem Relation Age of Onset  . Heart disease Mother   . CAD Mother   . Diabetes type II Mother   . Hypertension Mother   . Cancer Neg Hx    Social History  Substance Use Topics  . Smoking status: Former Smoker    Quit date: 01/06/2001  . Smokeless tobacco: None  . Alcohol Use: No    Review of Systems  Unable to perform ROS: Patient unresponsive      Allergies  Amoxicillin; Ampicillin; Latex; Morphine and related; Penicillins; and Tape  Home Medications   Prior to Admission medications   Medication Sig Start Date End Date Taking? Authorizing Provider  acyclovir (ZOVIRAX) 200 MG/5ML suspension Take 10 mLs (400 mg total) by mouth 2 (two) times daily. 07/02/15  Yes Allie Bossier, MD  aspirin 81 MG tablet Place 81 mg into feeding tube daily.    Yes Historical Provider, MD  atorvastatin (LIPITOR) 10 MG tablet Place 10 mg into feeding tube daily.    Yes Historical Provider, MD  cholecalciferol (VITAMIN D) 1000 units tablet 1,000 Units by Gastric Tube route daily.  Yes Historical Provider, MD  citalopram (CELEXA) 10 MG tablet Place 20 mg into feeding tube daily.    Yes Historical Provider, MD  Cranberry 425 MG CAPS Place 425 mg into feeding tube daily.    Yes Historical Provider, MD  Dextromethorphan-Quinidine 20-10 MG CAPS 1 capsule by PEG Tube route every 12 (twelve) hours.    Yes Historical Provider, MD  gabapentin (NEURONTIN) 250 MG/5ML solution Place 2 mLs (100 mg total) into feeding tube every 8 (eight) hours. 06/23/15  Yes Allie Bossier, MD  hydrALAZINE (APRESOLINE) 25 MG tablet 25 mg by PEG Tube route 2 (two) times daily.    Yes Historical Provider, MD  insulin lispro (HUMALOG KWIKPEN) 100 UNIT/ML KiwkPen Inject 0-10 Units into the skin every 6 (six) hours. 0-150 (0 units)  151-200 (2 units) 201-250 (4 units) 251-300 (6 units)  301-350 (8 units) 400 (10 units)   Yes Historical Provider, MD  insulin NPH Human  (HUMULIN N,NOVOLIN N) 100 UNIT/ML injection Inject 0.15 mLs (15 Units total) into the skin every 6 (six) hours. 06/23/15  Yes Allie Bossier, MD  ipratropium-albuterol (DUONEB) 0.5-2.5 (3) MG/3ML SOLN Take 3 mLs by nebulization 4 (four) times daily.   Yes Historical Provider, MD  lamoTRIgine (LAMICTAL) 25 MG tablet Take 50 mg by mouth daily.   Yes Historical Provider, MD  levETIRAcetam (KEPPRA) 100 MG/ML solution Place 5 mLs (500 mg total) into feeding tube 2 (two) times daily. 03/06/15  Yes Charlynne Cousins, MD  loperamide (IMODIUM) 1 MG/5ML solution Place 10 mLs (2 mg total) into feeding tube 4 (four) times daily. 06/23/15  Yes Allie Bossier, MD  Multiple Vitamins-Minerals (MULTIVITAMIN WITH MINERALS) tablet Take 1 tablet by mouth daily.   Yes Historical Provider, MD  Nutritional Supplements (FEEDING SUPPLEMENT, GLUCERNA 1.5 CAL,) LIQD Place 90 mL/hr into feeding tube See admin instructions. 8ml/hr from 2p to 5a   Yes Historical Provider, MD  nystatin (MYCOSTATIN) 100000 UNIT/ML suspension Take 5 mLs by mouth 2 (two) times daily. For 5 days - start date 08/14/15 08/12/15  Yes Historical Provider, MD  Omega 3-6-9 Fatty Acids (OMEGA-3 FUSION) LIQD Take 15 mLs by mouth 2 (two) times daily.   Yes Historical Provider, MD  ondansetron (ZOFRAN) 4 MG tablet Take 4 mg by mouth every 8 (eight) hours as needed for nausea or vomiting.   Yes Historical Provider, MD  predniSONE (DELTASONE) 5 MG tablet Place 5 mg into feeding tube daily.  03/30/15  Yes Historical Provider, MD  Probiotic Product (PROBIOTIC PO) Take 1 capsule by mouth 2 (two) times daily.   Yes Historical Provider, MD  promethazine (PHENERGAN) 25 MG/ML injection Inject 25 mg into the vein every 6 (six) hours as needed for nausea or vomiting.   Yes Historical Provider, MD  SANTYL ointment Apply 1 application topically 2 (two) times daily. 07/16/15  Yes Historical Provider, MD  sucralfate (CARAFATE) 1 GM/10ML suspension Place 1 g into feeding tube every  8 (eight) hours.    Yes Historical Provider, MD  tacrolimus (PROGRAF) 0.5 MG capsule Take 0.5 mg by mouth 2 (two) times daily.   Yes Historical Provider, MD  tacrolimus (PROGRAF) 1 MG capsule 3 mg by PEG Tube route 2 (two) times daily.    Yes Historical Provider, MD  vancomycin (VANCOCIN) 750 MG SOLR injection Inject 750 mg into the vein every other day. 08/14/15  Yes Historical Provider, MD  Water For Irrigation, Sterile (FREE WATER) SOLN Place 200 mLs into feeding tube 4 (four) times daily.  06/23/15  Yes Allie Bossier, MD   BP 125/64 mmHg  Pulse 69  Temp(Src) 99.5 F (37.5 C) (Rectal)  Resp 14  Wt 190 lb (86.183 kg)  SpO2 100% Physical Exam  Constitutional: He appears distressed.  Chronically ill-appearing  HENT:  Head: Normocephalic and atraumatic.  Dry mucous membranes  Eyes:  Right pupil pinpoint and nonreactive. Left pupil mid size and sluggishly reactive.  Cardiovascular: Normal rate and regular rhythm.   No murmur heard. Pulmonary/Chest: Effort normal. No respiratory distress.  Decreased air movement in bilateral lung bases  Abdominal: Soft. There is no tenderness. There is no rebound and no guarding.  Musculoskeletal:  IV in the right upper arm. There are multiple areas on the left arm and right arm of prior dialysis access sites.  Neurological:  Lethargic but arousable to painful stimuli. Nonverbal. Eyes open spontaneously. Right hemiparesis.  Skin: Skin is warm and dry.  Psychiatric: He has a normal mood and affect. His behavior is normal.  Nursing note and vitals reviewed.   ED Course  Procedures   CRITICAL CARE Performed by: Quintella Reichert   Total critical care time: 30 minutes  Critical care time was exclusive of separately billable procedures and treating other patients.  Critical care was necessary to treat or prevent imminent or life-threatening deterioration.  Critical care was time spent personally by me on the following activities: development of  treatment plan with patient and/or surrogate as well as nursing, discussions with consultants, evaluation of patient's response to treatment, examination of patient, obtaining history from patient or surrogate, ordering and performing treatments and interventions, ordering and review of laboratory studies, ordering and review of radiographic studies, pulse oximetry and re-evaluation of patient's condition.   Labs Review Labs Reviewed  CBC WITH DIFFERENTIAL/PLATELET - Abnormal; Notable for the following:    HCT 52.9 (*)    MCHC 27.4 (*)    RDW 16.7 (*)    Platelets 147 (*)    All other components within normal limits  URINALYSIS, ROUTINE W REFLEX MICROSCOPIC (NOT AT [facility]) - Abnormal; Notable for the following:    APPearance CLOUDY (*)    Glucose, UA >1000 (*)    Hgb urine dipstick SMALL (*)    Protein, ur 30 (*)    Leukocytes, UA MODERATE (*)    All other components within normal limits  URINE MICROSCOPIC-ADD ON - Abnormal; Notable for the following:    Bacteria, UA FEW (*)    All other components within normal limits  COMPREHENSIVE METABOLIC PANEL - Abnormal; Notable for the following:    Sodium 164 (*)    Potassium 6.5 (*)    Chloride 120 (*)    Glucose, Bld 209 (*)    BUN 110 (*)    Creatinine, Ser 2.91 (*)    Calcium 11.5 (*)    Albumin 2.3 (*)    AST 50 (*)    GFR calc non Af Amer 22 (*)    GFR calc Af Amer 25 (*)    All other components within normal limits  CBC - Abnormal; Notable for the following:    MCH 25.9 (*)    MCHC 26.9 (*)    RDW 16.8 (*)    All other components within normal limits  PROTIME-INR - Abnormal; Notable for the following:    Prothrombin Time 15.5 (*)    All other components within normal limits  I-STAT CG4 LACTIC ACID, ED - Abnormal; Notable for the following:    Lactic Acid, Venous 2.74 (*)  All other components within normal limits  CBG MONITORING, ED - Abnormal; Notable for the following:    Glucose-Capillary 189 (*)    All other  components within normal limits  I-STAT VENOUS BLOOD GAS, ED - Abnormal; Notable for the following:    pH, Ven 7.337 (*)    pCO2, Ven 79.6 (*)    Bicarbonate 42.6 (*)    Acid-Base Excess 12.0 (*)    All other components within normal limits  I-STAT CHEM 8, ED - Abnormal; Notable for the following:    Sodium 165 (*)    Potassium 5.3 (*)    Chloride 123 (*)    BUN 123 (*)    Creatinine, Ser 2.60 (*)    Glucose, Bld 208 (*)    Calcium, Ion 1.38 (*)    All other components within normal limits  CULTURE, BLOOD (ROUTINE X 2)  CULTURE, BLOOD (ROUTINE X 2)  URINE CULTURE  APTT  STREP PNEUMONIAE URINARY ANTIGEN  MAGNESIUM  PHOSPHORUS  LACTIC ACID, PLASMA  PROCALCITONIN  BLOOD GAS, ARTERIAL  AMYLASE  LIPASE, BLOOD  TROPONIN I  TROPONIN I  TROPONIN I  HEMOGLOBIN A1C  LEGIONELLA ANTIGEN, URINE  I-STAT CG4 LACTIC ACID, ED    Imaging Review Ct Head Wo Contrast  08/15/2015  CLINICAL DATA:  Altered mental status.  History of stroke EXAM: CT HEAD WITHOUT CONTRAST TECHNIQUE: Contiguous axial images were obtained from the base of the skull through the vertex without intravenous contrast. COMPARISON:  06/08/2015 FINDINGS: Skull and Sinuses:Frontal scalp fat infiltration is chronic and consistent with scar. There is diffuse chronic subcutaneous of the scalp. There is a remote left frontal burr hole. No acute fracture. Patchy left ethmoid opacification with clear nasopharynx. No acute sinusitis. Visualized orbits: Negative. Brain: Remote left MCA territory infarct with dense gliosis in the upper division territory and subcortical nuclei. Chronic small-vessel disease with ischemic gliosis around the lateral ventricles. Advanced atrophy of the pons, even when accounting for wallerian degeneration. Cerebellum is likewise symmetrically atrophic. No evidence of acute hemorrhage, hydrocephalus, or acute infarct. IMPRESSION: 1. No acute finding or change from 2016. 2. Remote left MCA territory infarct. 3.  Atrophy, severe at the level of the brainstem. Electronically Signed   By: Monte Fantasia M.D.   On: 08/15/2015 14:35   Dg Chest Portable 1 View  08/15/2015  CLINICAL DATA:  60 year old male with altered mental status. Question right arm PICC line. EXAM: PORTABLE CHEST 1 VIEW COMPARISON:  06/14/2015 and prior radiographs FINDINGS: This is a low volume film. Left basilar atelectasis is present. The cardiomediastinal silhouette is unchanged. There is no evidence of pneumothorax or definite pleural effusion. An endotracheal tube and NG tube is been removed. There is no evidence of right central venous catheter or PICC line on this study. IMPRESSION: Low volume film with left basilar atelectasis. No evidence of right PICC line or central venous catheter on this study. Electronically Signed   By: Margarette Canada M.D.   On: 08/15/2015 12:21   I have personally reviewed and evaluated these images and lab results as part of my medical decision-making.   EKG Interpretation None      MDM   Final diagnoses:  None    Patient here for evaluation of altered mental status. Difficulty contacting power of attorney and attempted to contact family multiple times for additional history. About 3 hours following ED arrival family was available for additional history. He states that he has been altered since Sunday with decreased level of  consciousness. They state he is not on dialysis and has not been for the last 9 years. Power of attorney is not available for contact throughout ED stay. Patient dehydrated appearing on examination, provided with gentle fluid hydration given his acute renal failure with significant hypernatremia as well as uremia. Patient also with hypercapnia that he is oxygenating without difficulty. Started on antibiotics for possible sepsis. Critical care consultation for admission for further treatment.    Quintella Reichert, MD 08/15/15 1701

## 2015-08-15 NOTE — H&P (Signed)
PULMONARY / CRITICAL CARE MEDICINE   Name: Justin [name] MRN: [id number] DOB: [birth year]    ADMISSION DATE:  08/15/2015   REFERRING MD: EDP  CHIEF COMPLAINT:  AMS. dehydration  HISTORY OF PRESENT ILLNESS:   60 yo AAM with hx of renal transplant, FTT , NHP who was discharged from [facility] 06/23/15 with resp failure requiring intubation, AMS , bacteremia , e coli and klebsiella pneumonia, ICH with right hemiparesis,chronic diarrhea on feeds via j tube who returns 08/15/15 with AMS and Na of 165. He was briefly on nimvs but this has been stopped. He will be admitted to ICU and continued attempts to address code status will be continued.  PAST MEDICAL HISTORY :  He  has a past medical history of Hypertension; Dyslipidemia; Carpal tunnel syndrome; A-V fistula ([place]); Anemia; Hemorrhoids; Hyperparathyroidism; Intracranial hemorrhage ([place]); Hypotension; Hyperkalemia; Kidney disease; Sepsis(995.91); GI bleeding; DVT (deep venous thrombosis) (H[facility]); PVD (peripheral vascular disease) ([place]); DM (diabetes mellitus) ([place]); Cellulitis; IV infiltration; PPD positive; Intracerebral bleed ([place]); Hypokinesis; Gout; Renal transplant, status post (10/19/2014); Pseudobulbar affect; and GERD (gastroesophageal reflux disease).  PAST SURGICAL HISTORY: He  has past surgical history that includes Kidney transplant; AV fistula placement; Peripherally inserted central catheter insertion; Amputation; Gastrostomy; IVC filter; Brain Biopsy; and #6 Shiley Cuff.  Allergies  Allergen Reactions  . Amoxicillin Other (See Comments)    ON MAR  . Ampicillin Other (See Comments)    ON MAR  . Latex Other (See Comments)    ON MAR  . Morphine And Related Other (See Comments)    UNK  . Penicillins Other (See Comments)    ON MAR; tolerated Ceftriaxone 8/27-9/1  . Tape Other (See Comments)    ON MAR    No current facility-administered medications on file prior to encounter.   Current Outpatient Prescriptions on File Prior  to Encounter  Medication Sig  . acyclovir (ZOVIRAX) 200 MG/5ML suspension Take 10 mLs (400 mg total) by mouth 2 (two) times daily.  Marland Kitchen aspirin 81 MG tablet Place 81 mg into feeding tube daily.   Marland Kitchen atorvastatin (LIPITOR) 10 MG tablet Place 10 mg into feeding tube daily.   . citalopram (CELEXA) 10 MG tablet Place 20 mg into feeding tube daily.   . Cranberry 425 MG CAPS Place 425 mg into feeding tube daily.   Marland Kitchen Dextromethorphan-Quinidine 20-10 MG CAPS 1 capsule by PEG Tube route every 12 (twelve) hours.   . gabapentin (NEURONTIN) 250 MG/5ML solution Place 2 mLs (100 mg total) into feeding tube every 8 (eight) hours.  . hydrALAZINE (APRESOLINE) 25 MG tablet 25 mg by PEG Tube route 2 (two) times daily.   . insulin NPH Human (HUMULIN N,NOVOLIN N) 100 UNIT/ML injection Inject 0.15 mLs (15 Units total) into the skin every 6 (six) hours.  Marland Kitchen ipratropium-albuterol (DUONEB) 0.5-2.5 (3) MG/3ML SOLN Take 3 mLs by nebulization 4 (four) times daily.  Marland Kitchen lamoTRIgine (LAMICTAL) 25 MG tablet Take 50 mg by mouth daily.  Marland Kitchen levETIRAcetam (KEPPRA) 100 MG/ML solution Place 5 mLs (500 mg total) into feeding tube 2 (two) times daily.  Marland Kitchen loperamide (IMODIUM) 1 MG/5ML solution Place 10 mLs (2 mg total) into feeding tube 4 (four) times daily.  . Multiple Vitamins-Minerals (MULTIVITAMIN WITH MINERALS) tablet Take 1 tablet by mouth daily.  . Nutritional Supplements (FEEDING SUPPLEMENT, GLUCERNA 1.5 CAL,) LIQD Place 90 mL/hr into feeding tube See admin instructions. 33ml/hr from 2p to 5a  . ondansetron (ZOFRAN) 4 MG tablet Take 4 mg by mouth every 8 (eight)  hours as needed for nausea or vomiting.  . predniSONE (DELTASONE) 5 MG tablet Place 5 mg into feeding tube daily.   . Probiotic Product (PROBIOTIC PO) Take 1 capsule by mouth 2 (two) times daily.  . promethazine (PHENERGAN) 25 MG/ML injection Inject 25 mg into the vein every 6 (six) hours as needed for nausea or vomiting.  . sucralfate (CARAFATE) 1 GM/10ML suspension Place 1  g into feeding tube every 8 (eight) hours.   . tacrolimus (PROGRAF) 0.5 MG capsule Take 0.5 mg by mouth 2 (two) times daily.  . tacrolimus (PROGRAF) 1 MG capsule 3 mg by PEG Tube route 2 (two) times daily.   . Water For Irrigation, Sterile (FREE WATER) SOLN Place 200 mLs into feeding tube 4 (four) times daily.    FAMILY HISTORY:  His indicated that his mother is deceased. He indicated that his father is deceased.   SOCIAL HISTORY: He  reports that he quit smoking about 14 years ago. He does not have any smokeless tobacco history on file. He reports that he uses illicit drugs. He reports that he does not drink alcohol.  REVIEW OF SYSTEMS:   NA  SUBJECTIVE:  Frail male  VITAL SIGNS: BP 112/74 mmHg  Pulse 72  Temp(Src) 99.5 F (37.5 C) (Rectal)  Resp 14  Wt 190 lb (86.183 kg)  SpO2 100%  HEMODYNAMICS:    VENTILATOR SETTINGS:    INTAKE / OUTPUT:    PHYSICAL EXAMINATION: General:  Obese but wasted AAM Neuro: Intermittently awake vs obtunded HEENT:  No jvd/ldn Cardiovascular:  hsr rrr  Lungs:  decreased air movement Abdomen:  Obese, J tube, +bs Musculoskeletal:  intact Skin:  Severe cellulitis changes penis/scotum  LABS:  BMET  Recent Labs Lab 08/15/15 1402  NA 165*  K 5.3*  CL 123*  BUN 123*  CREATININE 2.60*  GLUCOSE 208*    Electrolytes No results for input(s): CALCIUM, MG, PHOS in the last 168 hours.  CBC  Recent Labs Lab 08/15/15 1239 08/15/15 1402  WBC 8.0  --   HGB 14.5 14.6  HCT 52.9* 43.0  PLT 147*  --     Coag's No results for input(s): APTT, INR in the last 168 hours.  Sepsis Markers  Recent Labs Lab 08/15/15 1254  LATICACIDVEN 2.74*    ABG No results for input(s): PHART, PCO2ART, PO2ART in the last 168 hours.  Liver Enzymes No results for input(s): AST, ALT, ALKPHOS, BILITOT, ALBUMIN in the last 168 hours.  Cardiac Enzymes No results for input(s): TROPONINI, PROBNP in the last 168 hours.  Glucose  Recent Labs Lab  08/15/15 1146  GLUCAP 189*    Imaging Ct Head Wo Contrast  08/15/2015  CLINICAL DATA:  Altered mental status.  History of stroke EXAM: CT HEAD WITHOUT CONTRAST TECHNIQUE: Contiguous axial images were obtained from the base of the skull through the vertex without intravenous contrast. COMPARISON:  06/08/2015 FINDINGS: Skull and Sinuses:Frontal scalp fat infiltration is chronic and consistent with scar. There is diffuse chronic subcutaneous of the scalp. There is a remote left frontal burr hole. No acute fracture. Patchy left ethmoid opacification with clear nasopharynx. No acute sinusitis. Visualized orbits: Negative. Brain: Remote left MCA territory infarct with dense gliosis in the upper division territory and subcortical nuclei. Chronic small-vessel disease with ischemic gliosis around the lateral ventricles. Advanced atrophy of the pons, even when accounting for wallerian degeneration. Cerebellum is likewise symmetrically atrophic. No evidence of acute hemorrhage, hydrocephalus, or acute infarct. IMPRESSION: 1. No acute finding or  change from 2016. 2. Remote left MCA territory infarct. 3. Atrophy, severe at the level of the brainstem. Electronically Signed   By: Monte Fantasia M.D.   On: 08/15/2015 14:35   Dg Chest Portable 1 View  08/15/2015  CLINICAL DATA:  60 year old male with altered mental status. Question right arm PICC line. EXAM: PORTABLE CHEST 1 VIEW COMPARISON:  06/14/2015 and prior radiographs FINDINGS: This is a low volume film. Left basilar atelectasis is present. The cardiomediastinal silhouette is unchanged. There is no evidence of pneumothorax or definite pleural effusion. An endotracheal tube and NG tube is been removed. There is no evidence of right central venous catheter or PICC line on this study. IMPRESSION: Low volume film with left basilar atelectasis. No evidence of right PICC line or central venous catheter on this study. Electronically Signed   By: Margarette Canada M.D.   On:  08/15/2015 12:21     STUDIES:    CULTURES: 2/11 bc>>  ANTIBIOTICS: 2/11 vanc>> 2/11 fortaz>> 2/11 acyclovir via feed tube  SIGNIFICANT EVENTS:   LINES/TUBES:   DISCUSSION: 60 yo prolonged debilitation. Needs to be comfort care.  ASSESSMENT / PLAN:  PULMONARY A: Increased wob Hx of multiple intubations  P:   O2 as needed Avoid intubation Aspiration pecations  CARDIOVASCULAR A:  PVD Hypotension   P:  Hydration  RENAL Lab Results  Component Value Date   CREATININE 2.60* 08/15/2015   CREATININE 1.35* 06/23/2015   CREATININE 1.37* 06/22/2015    A:   Hx of renal transplant Hypernatremia metabolic acidosis P:   IVF hydration Follow labs  GASTROINTESTINAL A:   GI proection P:   Feed via j tube  HEMATOLOGIC A:   Chronic anemia P:  Follow h/h  INFECTIOUS A:   Cellulitis of penis and scrotum ? pna  HX of herpes  P:   See ID section  ENDOCRINE A:   DM P:   SSI  NEUROLOGIC A:   Decreased LOC most likely metabolic encephalopathy  Hx of ICH P:   RASS goal: 0 Hold all sedation Correct metabolic disarray May need CT head   FAMILY  - Updates: Daughter(not HCPOA) updated at bedside.  - Inter-disciplinary family meet or Palliative Care meeting due by:  day 7 Multiple palliative care meets set up with POA no show.  Richardson Landry Minor ACNP Maryanna Shape PCCM Pager [phone] till 3 pm If no answer page [phone] 08/15/2015, 4:03 PM   STAFF NOTE: I, Merrie Roof, MD FACP have personally reviewed patient's available data, including medical history, events of note, physical examination and test results as part of my evaluation. I have discussed with resident/NP and other care providers such as pharmacist, RN and RRT. In addition, I personally evaluated patient and elicited key findings of: baseline neurostatus and functional quality is horrible, multiple debilitating medical issues, presents with reduced LOC, severe hypernatremia,  hypovolemia, unclear if he is infected at all, UA noted some WBC, hydration important with 1/2 NS for now, slow correction Na, bipap was started, ABG is reassuring, re assess bmet frequent, consider empiric abx, BIPAP was started secondary to resp failure concerns, abg wnl, no sig increase wob, can dc BIPAP and add O2 , follow up k, i updated daughter in room who agrees that intubation and aggressive care would be harmful and allow him to suffer,she is not med poa but other sister is who we all have attempted to contact on phone home and cell and unable, i feel strong that intubation or heroics, acls,  cpr resuscitation would be futile , painful and medically ineffective and I will record DNR for now until /if we are able to contact other daughter  The patient is critically ill with multiple organ systems failure and requires high complexity decision making for assessment and support, frequent evaluation and titration of therapies, application of advanced monitoring technologies and extensive interpretation of multiple databases.   Critical Care Time devoted to patient care services described in this note is 30 Minutes. This time reflects time of care of this signee: Merrie Roof, MD FACP. This critical care time does not reflect procedure time, or teaching time or supervisory time of PA/NP/Med student/Med Resident etc but could involve care discussion time. Rest per NP/medical resident whose note is outlined above and that I agree with   Lavon Paganini. Titus Mould, MD, [place] Pgr: [facility] 08/15/2015 5:14 PM

## 2015-08-15 NOTE — ED Notes (Signed)
Attempted report x1. 

## 2015-08-16 DIAGNOSIS — N170 Acute kidney failure with tubular necrosis: Secondary | ICD-10-CM

## 2015-08-16 DIAGNOSIS — J9600 Acute respiratory failure, unspecified whether with hypoxia or hypercapnia: Secondary | ICD-10-CM

## 2015-08-16 LAB — BASIC METABOLIC PANEL
Anion gap: 10 (ref 5–15)
Anion gap: 9 (ref 5–15)
Anion gap: 12 (ref 5–15)
Anion gap: 7 (ref 5–15)
BUN: 105 mg/dL — ABNORMAL HIGH (ref 6–20)
BUN: 101 mg/dL — AB (ref 6–20)
BUN: 95 mg/dL — AB (ref 6–20)
BUN: 87 mg/dL — AB (ref 6–20)
CALCIUM: 10.3 mg/dL (ref 8.9–10.3)
CALCIUM: 10 mg/dL (ref 8.9–10.3)
CALCIUM: 10.1 mg/dL (ref 8.9–10.3)
CHLORIDE: 119 mmol/L — AB (ref 101–111)
CO2: 32 mmol/L (ref 22–32)
CO2: 31 mmol/L (ref 22–32)
CO2: 30 mmol/L (ref 22–32)
CO2: 32 mmol/L (ref 22–32)
CREATININE: 2.69 mg/dL — AB (ref 0.61–1.24)
CREATININE: 2.61 mg/dL — AB (ref 0.61–1.24)
CREATININE: 2.22 mg/dL — AB (ref 0.61–1.24)
Calcium: 9.8 mg/dL (ref 8.9–10.3)
Chloride: 122 mmol/L — ABNORMAL HIGH (ref 101–111)
Chloride: 122 mmol/L — ABNORMAL HIGH (ref 101–111)
Chloride: 120 mmol/L — ABNORMAL HIGH (ref 101–111)
Creatinine, Ser: 2.41 mg/dL — ABNORMAL HIGH (ref 0.61–1.24)
GFR calc Af Amer: 32 mL/min — ABNORMAL LOW (ref >60)
GFR calc Af Amer: 35 mL/min — ABNORMAL LOW (ref >60)
GFR calc non Af Amer: 24 mL/min — ABNORMAL LOW (ref >60)
GFR calc non Af Amer: 25 mL/min — ABNORMAL LOW (ref >60)
GFR calc non Af Amer: 30 mL/min — ABNORMAL LOW (ref >60)
GFR, EST AFRICAN AMERICAN: 28 mL/min — AB (ref >60)
GFR, EST AFRICAN AMERICAN: 29 mL/min — AB (ref >60)
GFR, EST NON AFRICAN AMERICAN: 27 mL/min — AB (ref >60)
GLUCOSE: 170 mg/dL — AB (ref 65–99)
Glucose, Bld: 320 mg/dL — ABNORMAL HIGH (ref 65–99)
Glucose, Bld: 245 mg/dL — ABNORMAL HIGH (ref 65–99)
Glucose, Bld: 177 mg/dL — ABNORMAL HIGH (ref 65–99)
POTASSIUM: 3.9 mmol/L (ref 3.5–5.1)
Potassium: 4.7 mmol/L (ref 3.5–5.1)
Potassium: 4.3 mmol/L (ref 3.5–5.1)
Potassium: 4.1 mmol/L (ref 3.5–5.1)
SODIUM: 164 mmol/L — AB (ref 135–145)
SODIUM: 162 mmol/L — AB (ref 135–145)
SODIUM: 158 mmol/L — AB (ref 135–145)
Sodium: 162 mmol/L (ref 135–145)

## 2015-08-16 LAB — POCT I-STAT 3, ART BLOOD GAS (G3+)
ACID-BASE EXCESS: 8 mmol/L — AB (ref 0.0–2.0)
BICARBONATE: 35.1 meq/L — AB (ref 20.0–24.0)
O2 Saturation: 99 %
PO2 ART: 121 mmHg — AB (ref 80.0–100.0)
Patient temperature: 95.5
TCO2: 37 mmol/L (ref 0–100)
pCO2 arterial: 61.5 mmHg (ref 35.0–45.0)
pH, Arterial: 7.357 (ref 7.350–7.450)

## 2015-08-16 LAB — CBC
HCT: 42.7 % (ref 39.0–52.0)
Hemoglobin: 11.2 g/dL — ABNORMAL LOW (ref 13.0–17.0)
MCH: 25.1 pg — AB (ref 26.0–34.0)
MCHC: 26.2 g/dL — AB (ref 30.0–36.0)
MCV: 95.7 fL (ref 78.0–100.0)
PLATELETS: 163 10*3/uL (ref 150–400)
RBC: 4.46 MIL/uL (ref 4.22–5.81)
RDW: 16.6 % — AB (ref 11.5–15.5)
WBC: 6.7 10*3/uL (ref 4.0–10.5)

## 2015-08-16 LAB — GLUCOSE, CAPILLARY
GLUCOSE-CAPILLARY: 151 mg/dL — AB (ref 65–99)
GLUCOSE-CAPILLARY: 154 mg/dL — AB (ref 65–99)
GLUCOSE-CAPILLARY: 220 mg/dL — AB (ref 65–99)
Glucose-Capillary: 135 mg/dL — ABNORMAL HIGH (ref 65–99)

## 2015-08-16 MED ORDER — KETOROLAC TROMETHAMINE 10 MG PO TABS
10.0000 mg | ORAL_TABLET | Freq: Four times a day (QID) | ORAL | Status: DC | PRN
  Administered 2015-08-17 – 2015-08-19 (×4): 10 mg via ORAL
  Filled 2015-08-16 (×7): qty 1

## 2015-08-16 MED ORDER — ACYCLOVIR 5 % EX OINT
TOPICAL_OINTMENT | CUTANEOUS | Status: DC
  Administered 2015-08-16 (×4): via TOPICAL
  Administered 2015-08-16 (×2): 1 via TOPICAL
  Administered 2015-08-17: 09:00:00 via TOPICAL
  Administered 2015-08-17: 1 via TOPICAL
  Administered 2015-08-17 – 2015-08-21 (×28): via TOPICAL
  Filled 2015-08-16: qty 5
  Filled 2015-08-16 (×5): qty 15

## 2015-08-16 MED ORDER — DEXTROSE 5 % IV SOLN
INTRAVENOUS | Status: AC
  Administered 2015-08-16 (×2): via INTRAVENOUS

## 2015-08-16 MED ORDER — HEPARIN SODIUM (PORCINE) 5000 UNIT/ML IJ SOLN
5000.0000 [IU] | Freq: Three times a day (TID) | INTRAMUSCULAR | Status: DC
  Administered 2015-08-16 – 2015-08-18 (×7): 5000 [IU] via SUBCUTANEOUS
  Filled 2015-08-16 (×5): qty 1

## 2015-08-16 MED ORDER — PANTOPRAZOLE SODIUM 40 MG PO PACK
40.0000 mg | PACK | Freq: Every day | ORAL | Status: DC
  Administered 2015-08-16 – 2015-08-20 (×5): 40 mg
  Filled 2015-08-16 (×7): qty 20

## 2015-08-16 NOTE — Progress Notes (Signed)
CRITICAL VALUE ALERT  Critical value received:  Sodium 164  Date of notification:  08/15/15  Time of notification:  2027  Critical value read back:Yes.    Nurse who received alert:  Donald Siva RN  MD notified (1st page):  Dr. Oletta Darter  Time of first page:  2030  MD notified (2nd page):  Time of second page:  Responding MD:  Dr. Oletta Darter  Time MD responded:  2035

## 2015-08-16 NOTE — Progress Notes (Signed)
[facility] Progress Note Patient Name: Justin [name] DOB: May 18, 1956 MRN: [id number]   Date of Service  08/16/2015  HPI/Events of Note  Nursing request pain meds. Patien has morphine listed as allergy  eICU Interventions  Toradol prn     Intervention Category Intermediate Interventions: Pain - evaluation and management  Dimas Chyle 08/16/2015, 6:35 PM

## 2015-08-16 NOTE — Consult Note (Signed)
WOC wound consult note Reason for Consult: penis sores, however when I arrived to the unit I made bedside nurse aware treatment of veneral diseases would be outside of the scope of practice for the [facility] nurse.  Bedside nurse noted however that patient has pressure injuries for me to assess. Wound type:State 3 Pressure Injury right elbow; Stage 2 Pressure Injury right ischium  Patient noted to have fissure and pressure injury on previous admission, area in the gluteal cleft and sacrum healed.  Pressure Ulcer POA: Yes Measurement:  Right elbow: 3cm x 2cm x 0.2cm  Right ischium: 2cm x 2.5cm x 0.1cm  Wound bed: Right elbow; 50% yellow centrally; 50% pink periphery  Right ischium: 100% pink, epithelial buds present Drainage (amount, consistency, odor) moderate; serosanguinous from the right elbow, no odor Periwound: intact  Dressing procedure/placement/frequency: Add silver hydrofiber to the right elbow wound for exudate management and for non healing wound, cover with foam, change every 3 days.  Silicone foam to the right ischium for protection, insulation, moist wound healing, change every 3 days and PRN soilage.  Discussed POC with patient and bedside nurse.  Re consult if needed, will not follow at this time. Thanks  Laiken Nohr [place], [place] [phone])

## 2015-08-16 NOTE — H&P (Signed)
PULMONARY / CRITICAL CARE MEDICINE   Name: Justin [name] MRN: [id number] DOB: April 25, 1956    ADMISSION DATE:  08/15/2015   REFERRING MD: EDP  CHIEF COMPLAINT:  AMS. dehydration  HISTORY OF PRESENT ILLNESS:   60 yo AAM with hx of renal transplant, FTT , NHP who was discharged from [facility] 06/23/15 with resp failure requiring intubation, AMS , bacteremia , e coli and klebsiella pneumonia, ICH with right hemiparesis,chronic diarrhea on feeds via j tube who returns 08/15/15 with AMS and Na of 165. He was briefly on nimvs but this has been stopped. He will be admitted to ICU and continued attempts to address code status will be continued.  SUBJECTIVE:  No distress Na improved  VITAL SIGNS: BP 121/69 mmHg  Pulse 74  Temp(Src) 98.3 F (36.8 C) (Oral)  Resp 17  Ht 5\' 4"  (1.626 m)  Wt 80.2 kg (176 lb 12.9 oz)  BMI 30.33 kg/m2  SpO2 93%  HEMODYNAMICS:    VENTILATOR SETTINGS:    INTAKE / OUTPUT: I/O last 3 completed shifts: In: 2092.5 [I.V.:1842.5; NG/GT:200; IV Piggyback:50] Out: 1300 [Urine:1300]  PHYSICAL EXAMINATION: General:  Obese but wasted AAM Neuro: awake is improved, more movement, interaction HEENT:  No jvd/ldn Cardiovascular:  hsr rrr  Lungs:  decreased but clear Abdomen:  Obese, J tube, +bs, no r Musculoskeletal:  intact Skin:  Severe cellulitis changes penis/scotum  LABS:  BMET  Recent Labs Lab 08/16/15 0104 08/16/15 0513 08/16/15 0853  NA 164* 162* 162*  K 4.7 4.3 4.1  CL 122* 122* 120*  CO2 32 31 30  BUN 105* 101* 95*  CREATININE 2.69* 2.61* 2.41*  GLUCOSE 320* 245* 170*    Electrolytes  Recent Labs Lab 08/15/15 1639  08/16/15 0104 08/16/15 0513 08/16/15 0853  CALCIUM  --   < > 10.3 10.0 10.1  MG 3.0*  --   --   --   --   PHOS 3.1  --   --   --   --   < > = values in this interval not displayed.  CBC  Recent Labs Lab 08/15/15 1239 08/15/15 1402 08/15/15 1639 08/16/15 0513  WBC 8.0  --  7.4 6.7  HGB 14.5 14.6 13.0 11.2*   HCT 52.9* 43.0 48.3 42.7  PLT 147*  --  148* 163    Coag's  Recent Labs Lab 08/15/15 1639  APTT 32  INR 1.22    Sepsis Markers  Recent Labs Lab 08/15/15 1254 08/15/15 1639 08/15/15 1640 08/15/15 1645  LATICACIDVEN 2.74*  --  2.0 1.98  PROCALCITON  --  0.19  --   --     ABG  Recent Labs Lab 08/16/15 0250  PHART 7.357  PCO2ART 61.5*  PO2ART 121.0*    Liver Enzymes  Recent Labs Lab 08/15/15 1239  AST 50*  ALT 30  ALKPHOS 67  BILITOT 1.0  ALBUMIN 2.3*    Cardiac Enzymes  Recent Labs Lab 08/15/15 1639  TROPONINI 0.23*    Glucose  Recent Labs Lab 08/15/15 1146 08/15/15 1702 08/15/15 1758 08/15/15 2016 08/16/15 0732  GLUCAP 189* 197* 249* 298* 151*    Imaging Ct Head Wo Contrast  08/15/2015  CLINICAL DATA:  Altered mental status.  History of stroke EXAM: CT HEAD WITHOUT CONTRAST TECHNIQUE: Contiguous axial images were obtained from the base of the skull through the vertex without intravenous contrast. COMPARISON:  06/08/2015 FINDINGS: Skull and Sinuses:Frontal scalp fat infiltration is chronic and consistent with scar. There is diffuse chronic subcutaneous  of the scalp. There is a remote left frontal burr hole. No acute fracture. Patchy left ethmoid opacification with clear nasopharynx. No acute sinusitis. Visualized orbits: Negative. Brain: Remote left MCA territory infarct with dense gliosis in the upper division territory and subcortical nuclei. Chronic small-vessel disease with ischemic gliosis around the lateral ventricles. Advanced atrophy of the pons, even when accounting for wallerian degeneration. Cerebellum is likewise symmetrically atrophic. No evidence of acute hemorrhage, hydrocephalus, or acute infarct. IMPRESSION: 1. No acute finding or change from 2016. 2. Remote left MCA territory infarct. 3. Atrophy, severe at the level of the brainstem. Electronically Signed   By: Monte Fantasia M.D.   On: 08/15/2015 14:35   Dg Chest Portable 1  View  08/15/2015  CLINICAL DATA:  60 year old male with altered mental status. Question right arm PICC line. EXAM: PORTABLE CHEST 1 VIEW COMPARISON:  06/14/2015 and prior radiographs FINDINGS: This is a low volume film. Left basilar atelectasis is present. The cardiomediastinal silhouette is unchanged. There is no evidence of pneumothorax or definite pleural effusion. An endotracheal tube and NG tube is been removed. There is no evidence of right central venous catheter or PICC line on this study. IMPRESSION: Low volume film with left basilar atelectasis. No evidence of right PICC line or central venous catheter on this study. Electronically Signed   By: Margarette Canada M.D.   On: 08/15/2015 12:21     STUDIES:    CULTURES: 2/11 bc>>  ANTIBIOTICS: 2/11 vanc>> 2/11 fortaz>> 2/11 acyclovir via feed tube>>>  SIGNIFICANT EVENTS: 2/12 - more awake, NA improved  LINES/TUBES:   DISCUSSION: 60 yo prolonged debilitation. Needs to be comfort care.  ASSESSMENT / PLAN:  PULMONARY A: Increased wob- resolved Hx of multiple intubations  P:   O2 as needed Aspiration precautions abg reviewed, wnl, no role repeat  CARDIOVASCULAR A:  PVD hypovolemia Hypotension resolved  P:  Hydration, maintain Dc tele  RENAL Lab Results  Component Value Date   CREATININE 2.41* 08/16/2015   CREATININE 2.61* 08/16/2015   CREATININE 2.69* 08/16/2015    A:   Hx of renal transplant Hypernatremia slow progress metabolic acidosis P:   IVF hydration, change to d5w at 75 Follow labs Free water bmet to q8h   GASTROINTESTINAL A:   GI protection P:   Feed via j tube ppi  HEMATOLOGIC A:   Chronic anemia P:  Follow h/h Add sub q heparin No lov with arf  INFECTIOUS A:   Cellulitis of penis and scrotum ? pna  HX of herpes  P:   Consider add topical hsv treatement  ENDOCRINE A:   DM P:   SSI  NEUROLOGIC A:   Decreased LOC most likely metabolic encephalopathy - improving Hx of  ICH P:   RASS goal: 0 Hold all sedation   FAMILY  - Updates: Daughter(not HCPOA) updated at bedside 2/11. Aggressive heroics is futile, will again attempt to call med poa but i wrote DNR based on such suffering he would undergo if arrest or decline and heroics offered , would be futile  - Inter-disciplinary family meet or Palliative Care meeting due by:  day 7 Multiple palliative care meets set up with POA no show.  To triad, med  Lavon Paganini. Titus Mould, MD, [place] Pgr: [place] [facility] 08/16/2015 10:03 AM

## 2015-08-17 DIAGNOSIS — N4822 Cellulitis of corpus cavernosum and penis: Secondary | ICD-10-CM

## 2015-08-17 LAB — BASIC METABOLIC PANEL
ANION GAP: 8 (ref 5–15)
Anion gap: 15 (ref 5–15)
BUN: 80 mg/dL — AB (ref 6–20)
BUN: 76 mg/dL — AB (ref 6–20)
CALCIUM: 10 mg/dL (ref 8.9–10.3)
CHLORIDE: 119 mmol/L — AB (ref 101–111)
CO2: 29 mmol/L (ref 22–32)
CO2: 20 mmol/L — AB (ref 22–32)
CREATININE: 2.14 mg/dL — AB (ref 0.61–1.24)
CREATININE: 2.18 mg/dL — AB (ref 0.61–1.24)
Calcium: 10 mg/dL (ref 8.9–10.3)
Chloride: 122 mmol/L — ABNORMAL HIGH (ref 101–111)
GFR calc non Af Amer: 32 mL/min — ABNORMAL LOW (ref >60)
GFR calc non Af Amer: 31 mL/min — ABNORMAL LOW (ref >60)
GFR, EST AFRICAN AMERICAN: 37 mL/min — AB (ref >60)
GFR, EST AFRICAN AMERICAN: 36 mL/min — AB (ref >60)
GLUCOSE: 268 mg/dL — AB (ref 65–99)
Glucose, Bld: 204 mg/dL — ABNORMAL HIGH (ref 65–99)
Potassium: 4 mmol/L (ref 3.5–5.1)
Potassium: >7.5 mmol/L (ref 3.5–5.1)
SODIUM: 156 mmol/L — AB (ref 135–145)
SODIUM: 157 mmol/L — AB (ref 135–145)

## 2015-08-17 LAB — GLUCOSE, CAPILLARY
GLUCOSE-CAPILLARY: 169 mg/dL — AB (ref 65–99)
GLUCOSE-CAPILLARY: 186 mg/dL — AB (ref 65–99)
GLUCOSE-CAPILLARY: 202 mg/dL — AB (ref 65–99)
Glucose-Capillary: 122 mg/dL — ABNORMAL HIGH (ref 65–99)
Glucose-Capillary: 123 mg/dL — ABNORMAL HIGH (ref 65–99)
Glucose-Capillary: 184 mg/dL — ABNORMAL HIGH (ref 65–99)
Glucose-Capillary: 223 mg/dL — ABNORMAL HIGH (ref 65–99)
Glucose-Capillary: 250 mg/dL — ABNORMAL HIGH (ref 65–99)
Glucose-Capillary: 279 mg/dL — ABNORMAL HIGH (ref 65–99)

## 2015-08-17 LAB — URINE CULTURE: Culture: 10000

## 2015-08-17 LAB — LEGIONELLA ANTIGEN, URINE

## 2015-08-17 LAB — HEMOGLOBIN A1C
HEMOGLOBIN A1C: 8.6 % — AB (ref 4.8–5.6)
MEAN PLASMA GLUCOSE: 200 mg/dL

## 2015-08-17 MED ORDER — MUPIROCIN 2 % EX OINT
1.0000 "application " | TOPICAL_OINTMENT | Freq: Two times a day (BID) | CUTANEOUS | Status: DC
  Administered 2015-08-17 – 2015-08-21 (×9): 1 via NASAL
  Filled 2015-08-17 (×2): qty 22

## 2015-08-17 MED ORDER — INSULIN GLARGINE 100 UNIT/ML ~~LOC~~ SOLN
12.0000 [IU] | Freq: Every day | SUBCUTANEOUS | Status: DC
  Administered 2015-08-17: 12 [IU] via SUBCUTANEOUS
  Filled 2015-08-17 (×2): qty 0.12

## 2015-08-17 MED ORDER — CHLORHEXIDINE GLUCONATE CLOTH 2 % EX PADS
6.0000 | MEDICATED_PAD | Freq: Every day | CUTANEOUS | Status: AC
Start: 2015-08-17 — End: 2015-08-21
  Administered 2015-08-17 – 2015-08-21 (×5): 6 via TOPICAL

## 2015-08-17 MED ORDER — HYDROCORTISONE NA SUCCINATE PF 100 MG IJ SOLR
25.0000 mg | Freq: Three times a day (TID) | INTRAMUSCULAR | Status: DC
  Administered 2015-08-17 – 2015-08-19 (×5): 25 mg via INTRAVENOUS
  Filled 2015-08-17 (×5): qty 2

## 2015-08-17 NOTE — Clinical Documentation Improvement (Signed)
Critical Care   Possible conditions?  Respiratory Failure   Document Acuity - Acute, Chronic, Acute on Chronic  Document Inclusion Of - Hypoxia, Hypercapnia, Combination of Both  Other  Clinically Undetermined  Document any associated diagnoses/conditions.  Please update your documentation within the medical record to reflect your response to this query. Thank you.  Supporting Information:(As per notes) "Pt. Has wet cough, RA sats 70-80%." Oxygen Therapy: O2 @ 4l/m via [place]  Please exercise your independent, professional judgment when responding. A specific answer is not anticipated or expected.  Thank You, Alessandra Grout, RN, BSN, CCDS,Clinical Documentation Specialist:  [phone]  [phone]=Cell [place]- Health Information Management

## 2015-08-17 NOTE — Care Management Note (Addendum)
Case Management Note  Patient Details  Name: Justin [name] MRN: [id number] Date of Birth: 1955/12/18  Subjective/Objective:                    Action/Plan:   Expected Discharge Date:                  Expected Discharge Plan:  Assisted Living / Rest Home  In-House Referral:  Clinical Social Work  Discharge planning Services     Post Acute Care Choice:    Choice offered to:     DME Arranged:    DME Agency:     HH Arranged:    [place] Agency:     Status of Service:  In process, will continue to follow  Medicare Important Message Given:    Date Medicare IM Given:    Medicare IM give by:    Date Additional Medicare IM Given:    Additional Medicare Important Message give by:     If discussed at [place] of Stay Meetings, dates discussed:    Additional Comments: UR updated  Marilu Favre, RN 08/17/2015, 11:15 AM

## 2015-08-17 NOTE — Clinical Documentation Improvement (Signed)
Critical Care The diagnosis of pressure ulcer is documented by the [facility] nurse but is not in the physician progress notes. If you agree with the [facility] nurse evaluation please document in the progress note.   Document if pressure ulcer with stage is Present on Admission   Document Site with laterality - Elbow, Back (upper/lower), Sacral, Hip, Buttock, Ankle, Heel, Head, Other (Specify)  Pressure Ulcer Stage - Stage1, Stage 2, Stage 3, Stage 4, Unstageable, Unspecified, Unable to Clinically Determine  Other  Clinically Undetermined  Please update your documentation within the medical record to reflect your response to this query. Thank you.  Supporting Information:  Filed: 08/16/2015 10:18 AM  Note Time: 08/16/2015 10:12 AM  Status: Signed    Editor: Nadara Mode, RN (Registered Nurse)               [place] wound consult note  Reason for Consult:  penis sores, however when I arrived to the unit I made bedside nurse aware treatment of veneral diseases would be outside of the scope of practice for the [facility] nurse. Bedside nurse noted however that patient has pressure injuries for me to assess.  Wound type:State 3 Pressure Injury right elbow; Stage 2 Pressure Injury right ischium  Patient noted to have fissure and pressure injury on previous admission, area in the gluteal cleft and sacrum healed.  Pressure Ulcer POA: Yes   Please exercise your independent, professional judgment when responding. A specific answer is not anticipated or expected.  Thank You, Alessandra Grout, RN, BSN, CCDS,Clinical Documentation Specialist:  [phone]  [phone]=Cell [place]- Health Information Management

## 2015-08-17 NOTE — Progress Notes (Signed)
Initial Nutrition Assessment  DOCUMENTATION CODES:   Obesity unspecified  INTERVENTION:   -If pt/family desire continued aggressive care, recommend:  Initiate Glucerna @ 1.2 ml/hr via j-tube and increase by 10 ml every 4 hours to goal rate of 75 ml/hr.   Tube feeding regimen provides 2160kcal (135f needs), 108ams of protein, and 1449 ml of H2O.   NUTRITION DIAGNOSIS:   Inadequate oral intake related to inability to eat as evidenced by NPO status.  GOAL:   Patient will meet greater than or equal to 90% of their needs  MONITOR:   Labs, Weight trends, Skin, I & O's  REASON FOR ASSESSMENT:   Low [place], Consult Assessment of nutrition requirement/status  ASSESSMENT:   60 yo AAM with hx of renal transplant, FTT , NHP who was discharged from [facility] 06/23/15 with resp failure requiring intubation, AMS , bacteremia , e coli and klebsiella pneumonia, ICH with right hemiparesis,chronic diarrhea on feeds via j tube who returns 08/15/15 with AMS and Na of 165. He was briefly on nimvs but this has been stopped. He will be admitted to ICU and continued attempts to address code status will be continued.  Pt admitted with AMS and dehydration.   Pt lying in bed, unable to provide hx. No family present at time of visit.   Pt is j-tube dependent. PTA, pt was receiving Glucerna 1.5 @ 90 ml/hr from 1200-0500 (15 hours) daily, which provides 2025 kcals, 111 grams protein, and 1024 ml fluid daily (total 1824 ml fluid daily with 200 ml free water flush 4 times daily regimen). This meets 100% of estimated kcal and protein needs.   Reviewed [facility] note from 08/16/15. Pt with stage III pressure injury to right elbow, stage II pressure injury to right ischium, and penile sores consistent with venereal disease.  Nutrition-Focused physical exam completed. Findings are mild fat depletion, moderate to severe muscle depletion, and no edema. Suspect fat and muscle wasting is related to limited mobility.    Case discussed with RN. Awaiting contact with family to establish goals of care. Per RN, family meeting was arranged during previous admission. However, family was a no-show.   Labs reviewed: CBGS: 186-150. Na: 156, Glucose: 268.   Diet Order:  Diet NPO time specified  Skin:  Wound (see comment) (Stage III right elbow, Stage II rt ischium, penile sores)  Last BM:  08/16/15  Height:   Ht Readings from Last 1 Encounters:  08/15/15 5\' 4"  (1.626 m)    Weight:   Wt Readings from Last 1 Encounters:  08/17/15 175 lb 7.8 oz (79.6 kg)    Ideal Body Weight:  59.1 kg  BMI:  Body mass index is 30.11 kg/(m^2).  Estimated Nutritional Needs:   Kcal:  2000-2200  Protein:  100-115 grams  Fluid:  2.0-2.2 L  EDUCATION NEEDS:   No education needs identified at this time  Victory Dresden A. Jimmye Norman, RD, LDN, CDE Pager: [phone] After hours Pager: [phone]

## 2015-08-17 NOTE — Progress Notes (Signed)
MD notified that labs keep embolizing. Order for picc line placement given.

## 2015-08-17 NOTE — Progress Notes (Signed)
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 Patient is had a number of admissions for sepsis from different infections and then  Was hospitalized in December 2016 to respiratory failure secondary to aspiration pneumonia.   Patient was admitted on 2/11 after coming in from his nursing facility with  Being nonresponsive , sodium of 165 and initially required high flow oxygen. He was able to be stabilized in ICU. Patient was started on D5W as well as antibiotics for presumptive treatment of cellulitis of penis. He did show some improvement and was felt to be a bit more stable and transferred to the floor.   Patient was made DO NOT RESUSCITATE after various attempts to contact medical power of attorney, however,  Critical care who knew patient well felt that if the patient arrested or declined , any resuscitation efforts would be futile and contribute to increased suffering.   patient today awake, interactive, repeats garbled sentences, but able to follow commands.  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 Placing PICC line  Metabolic encephalopathy: In the setting of previous intracranial hemorrhage and brain radiation: Improving, we'll discuss with family as to how close he is to baseline   penile cellulitis: Continue antibiotics. Patient appears stable. No signs of sepsis. White count normalized. No fever.    Code Status: DNR  Family Communication: Left message with daughter   Disposition Plan: Cont inpatient, anticipate return to SNF in few days once sodium normalized and 2 feeds resumed    Consultants:  Critical care   Procedures:  PICC line placement pending   Antibiotics:  IV Fortaz and vancomycin 2/11 present   Objective: BP 124/80 mmHg  Pulse 72  Temp(Src) 97.5 F (36.4 C) (Axillary)  Resp 15  Ht 5\' 4"  (1.626 m)  Wt 79.6 kg (175 lb 7.8 oz)  BMI 30.11 kg/m2  SpO2 98%  Intake/Output Summary (Last 24 hours) at 08/17/15 1609 Last data filed at 08/17/15 0900  Gross per 24 hour  Intake   1175 ml  Output    200 ml  Net    975 ml   Filed Weights   08/15/15 1800 08/16/15 0442 08/17/15 0421  Weight: 82.1 kg (181 lb) 80.2 kg (176 lb 12.9 oz) 79.6 kg (175 lb 7.8 oz)    Exam:   General:   oriented 1   Cardiovascular: Regular rate and rhythm, S1-S2    Respiratory: Poor inspiratory effort    Abdomen: Soft, non-distended, hypoactive bowel sounds  Musculoskeletal: Trace edema bilaterally    Data Reviewed: Basic Metabolic Panel:  Recent Labs Lab 08/15/15 1639  08/16/15 0513 08/16/15 0853 08/16/15 1653 08/17/15 0106 08/17/15 1239  NA  --   < > 162* 162* 158* 156* 157*  K  --   < > 4.3 4.1 3.9 4.0 >7.5*  CL  --   < > 122* 120* 119* 119* 122*  CO2  --   < > 31 30 32 29 20*  GLUCOSE  --   < > 245* 170* 177* 268* 204*    BUN  --   < > 101* 95* 87* 80* 76*  CREATININE  --   < > 2.61* 2.41* 2.22* 2.14* 2.18*  CALCIUM  --   < > 10.0 10.1 9.8 10.0 10.0  MG 3.0*  --   --   --   --   --   --   PHOS 3.1  --   --   --   --   --   --   < > = values in this interval not displayed. Liver Function Tests:  Recent Labs Lab 08/15/15 1239  AST 50*  ALT 30  ALKPHOS 67  BILITOT 1.0  PROT 7.9  ALBUMIN 2.3*    Recent Labs Lab 08/15/15 1639  LIPASE 29  AMYLASE 43   No results for input(s): AMMONIA in the last 168 hours. CBC:  Recent Labs Lab 08/15/15 1239 08/15/15 1402 08/15/15 1639 08/16/15 0513  WBC 8.0  --  7.4 6.7  NEUTROABS 5.6  --   --   --   HGB 14.5 14.6 13.0 11.2*  HCT 52.9* 43.0 48.3 42.7  MCV 96.2  --  96.4 95.7  PLT 147*  --  148* 163   Cardiac Enzymes:    Recent Labs Lab 08/15/15 1639  TROPONINI 0.23*   BNP (last 3 results) No results for input(s): BNP in the last 8760 hours.  ProBNP (last 3 results) No results for input(s): PROBNP in the last 8760 hours.  CBG:  Recent Labs Lab 08/16/15 2015 08/17/15 0021 08/17/15 0429 08/17/15 0807 08/17/15 1245  GLUCAP 220* 250* 186* 202* 184*    Recent Results (from the past 240 hour(s))  Urine culture     Status: None   Collection Time: 08/15/15 12:17 PM  Result Value Ref Range Status   Specimen Description URINE, CATHETERIZED  Final   Special Requests NONE  Final   Culture 10,000 COLONIES/mL ESCHERICHIA COLI  Final   Report Status 08/17/2015 FINAL  Final   Organism ID, Bacteria ESCHERICHIA COLI  Final      Susceptibility   Escherichia coli - MIC*    AMPICILLIN >=32 RESISTANT Resistant     CEFAZOLIN <=4 SENSITIVE Sensitive     CEFTRIAXONE <=1 SENSITIVE Sensitive     CIPROFLOXACIN >=4 RESISTANT Resistant     GENTAMICIN >=16 RESISTANT Resistant     IMIPENEM <=0.25 SENSITIVE Sensitive     NITROFURANTOIN <=16 SENSITIVE Sensitive     TRIMETH/SULFA <=20 SENSITIVE Sensitive     AMPICILLIN/SULBACTAM 16 INTERMEDIATE  Intermediate     PIP/TAZO <=4 SENSITIVE Sensitive     * 10,000 COLONIES/mL ESCHERICHIA COLI  Blood Culture (routine x 2)     Status: None (Preliminary result)   Collection Time: 08/15/15 12:39 PM  Result Value Ref Range Status   Specimen Description BLOOD RIGHT HAND  Final   Special Requests BOTTLES DRAWN AEROBIC AND ANAEROBIC 3CC  Final   Culture NO GROWTH 2 DAYS  Final   Report Status PENDING  Incomplete  Blood Culture (routine x 2)     Status: None (Preliminary result)   Collection Time: 08/15/15  4:40 PM  Result Value Ref Range Status   Specimen Description BLOOD RIGHT HAND  Final   Special Requests PEDIATRICS 4CC  Final   Culture NO GROWTH 2 DAYS  Final   Report Status PENDING  Incomplete  MRSA PCR Screening     Status: Abnormal   Collection Time: 08/15/15  5:55 PM  Result Value Ref Range Status   MRSA by PCR POSITIVE (A) NEGATIVE Final    Comment:        The GeneXpert MRSA Assay (FDA approved for NASAL specimens only), is one component of a comprehensive MRSA colonization surveillance program. It is not intended to diagnose MRSA infection nor to guide or monitor treatment for MRSA infections. RESULT CALLED TO, READ BACK BY AND VERIFIED WITH: SMITH,V RN 08/15/15 1956 [place]      Studies: No results found.  Scheduled Meds: . acyclovir  200 mg Per Tube BID  . acyclovir ointment   Topical Q3H  . antiseptic oral rinse  7 mL Mouth Rinse BID  . cefTAZidime (FORTAZ)  IV  2 g Intravenous Q12H  . Chlorhexidine Gluconate Cloth  6 each Topical Q0600  . free water  200 mL Per Tube 6 times per day  . heparin subcutaneous  5,000 Units Subcutaneous 3 times per day  . hydrocortisone sod succinate (SOLU-CORTEF) inj  50 mg Intravenous Q8H  . insulin aspart  0-20 Units Subcutaneous 6 times per day  . insulin glargine  10 Units Subcutaneous QHS  . mupirocin ointment  1 application Nasal BID  . pantoprazole sodium  40 mg Per Tube QHS  . vancomycin  1,250 mg Intravenous Q24H     Continuous Infusions: . dextrose 75 mL/hr at 08/16/15 2333     Time spent: 25 minutes  [facility] Pager [phone] . If 7PM-7AM, please contact night-coverage at [email or web address], password [facility] 08/17/2015, 4:09 PM  LOS: 2 days

## 2015-08-18 LAB — CBC
HCT: 38.2 % — ABNORMAL LOW (ref 39.0–52.0)
Hemoglobin: 10.9 g/dL — ABNORMAL LOW (ref 13.0–17.0)
MCH: 25.5 pg — ABNORMAL LOW (ref 26.0–34.0)
MCHC: 28.5 g/dL — ABNORMAL LOW (ref 30.0–36.0)
MCV: 89.3 fL (ref 78.0–100.0)
PLATELETS: 84 10*3/uL — AB (ref 150–400)
RBC: 4.28 MIL/uL (ref 4.22–5.81)
RDW: 16.2 % — AB (ref 11.5–15.5)
WBC: 4.2 10*3/uL (ref 4.0–10.5)

## 2015-08-18 LAB — BASIC METABOLIC PANEL
Anion gap: 11 (ref 5–15)
Anion gap: 9 (ref 5–15)
BUN: 53 mg/dL — AB (ref 6–20)
BUN: 52 mg/dL — ABNORMAL HIGH (ref 6–20)
CALCIUM: 9.8 mg/dL (ref 8.9–10.3)
CHLORIDE: 112 mmol/L — AB (ref 101–111)
CHLORIDE: 114 mmol/L — AB (ref 101–111)
CO2: 30 mmol/L (ref 22–32)
CO2: 30 mmol/L (ref 22–32)
CREATININE: 1.6 mg/dL — AB (ref 0.61–1.24)
CREATININE: 1.55 mg/dL — AB (ref 0.61–1.24)
Calcium: 9.9 mg/dL (ref 8.9–10.3)
GFR calc Af Amer: 52 mL/min — ABNORMAL LOW (ref >60)
GFR calc non Af Amer: 45 mL/min — ABNORMAL LOW (ref >60)
GFR calc non Af Amer: 47 mL/min — ABNORMAL LOW (ref >60)
GFR, EST AFRICAN AMERICAN: 54 mL/min — AB (ref >60)
GLUCOSE: 73 mg/dL (ref 65–99)
Glucose, Bld: 78 mg/dL (ref 65–99)
Potassium: 3.3 mmol/L — ABNORMAL LOW (ref 3.5–5.1)
Potassium: 2.8 mmol/L — ABNORMAL LOW (ref 3.5–5.1)
SODIUM: 153 mmol/L — AB (ref 135–145)
Sodium: 153 mmol/L — ABNORMAL HIGH (ref 135–145)

## 2015-08-18 LAB — GLUCOSE, CAPILLARY
GLUCOSE-CAPILLARY: 71 mg/dL (ref 65–99)
GLUCOSE-CAPILLARY: 67 mg/dL (ref 65–99)
GLUCOSE-CAPILLARY: 65 mg/dL (ref 65–99)
GLUCOSE-CAPILLARY: 76 mg/dL (ref 65–99)
GLUCOSE-CAPILLARY: 142 mg/dL — AB (ref 65–99)
Glucose-Capillary: 104 mg/dL — ABNORMAL HIGH (ref 65–99)
Glucose-Capillary: 119 mg/dL — ABNORMAL HIGH (ref 65–99)

## 2015-08-18 LAB — MAGNESIUM: Magnesium: 2.6 mg/dL — ABNORMAL HIGH (ref 1.7–2.4)

## 2015-08-18 LAB — VANCOMYCIN, TROUGH: Vancomycin Tr: 29 ug/mL (ref 10.0–20.0)

## 2015-08-18 MED ORDER — VANCOMYCIN HCL IN DEXTROSE 750-5 MG/150ML-% IV SOLN
750.0000 mg | INTRAVENOUS | Status: DC
  Administered 2015-08-19: 750 mg via INTRAVENOUS
  Filled 2015-08-18 (×2): qty 150

## 2015-08-18 MED ORDER — INSULIN GLARGINE 100 UNIT/ML ~~LOC~~ SOLN
10.0000 [IU] | Freq: Every day | SUBCUTANEOUS | Status: DC
  Administered 2015-08-18 – 2015-08-20 (×3): 10 [IU] via SUBCUTANEOUS
  Filled 2015-08-18 (×4): qty 0.1

## 2015-08-18 MED ORDER — POTASSIUM CHLORIDE 20 MEQ/15ML (10%) PO SOLN
40.0000 meq | Freq: Two times a day (BID) | ORAL | Status: AC
  Administered 2015-08-18 (×2): 40 meq
  Filled 2015-08-18: qty 30

## 2015-08-18 MED ORDER — SODIUM CHLORIDE 0.9% FLUSH: 10.0000 mL | Freq: Two times a day (BID) | INTRAVENOUS | Status: DC

## 2015-08-18 MED ORDER — SODIUM CHLORIDE 0.9% FLUSH
10.0000 mL | INTRAVENOUS | Status: DC | PRN
  Administered 2015-08-20: 10 mL
  Filled 2015-08-18: qty 40

## 2015-08-18 NOTE — Progress Notes (Signed)
Right arm swelling observed, concern for IV infiltration raised.  However, backflow of blood noted on aspiration.  Right arm elevated.  Will monitor and evaluate IV site.

## 2015-08-18 NOTE — Progress Notes (Signed)
IV site rechecked, draws minimal blood, unable to flush.  IV removed, IV team consult ordered.  Fluids stopped for now due to loss of access.

## 2015-08-18 NOTE — Progress Notes (Signed)
Phone consent obtasPeripherally Inserted Central Catheter/Midline Placement  The IV Nurse has discussed with the patient and/or persons authorized to consent for the patient, the purpose of this procedure and the potential benefits and risks involved with this procedure.  The benefits include less needle sticks, lab draws from the catheter and patient may be discharged home with the catheter.  Risks include, but not limited to, infection, bleeding, blood clot (thrombus formation), and puncture of an artery; nerve damage and irregular heat beat.  Alternatives to this procedure were also discussed.  Phone consent obtain previous day from April Bellantoni   . PICC/Midline Placement Documentation  PICC Single Lumen 08/18/15 PICC Right Brachial 43 cm 0 cm (Active)  Indication for Insertion or Continuance of Line Poor Vasculature-patient has had multiple peripheral attempts or PIVs lasting less than 24 hours 08/18/2015  8:40 AM  Exposed Catheter (cm) 0 cm 08/18/2015  8:40 AM  Site Assessment Clean;Dry;Intact 08/18/2015  8:40 AM  Line Status Flushed;Saline locked;Blood return noted 08/18/2015  8:40 AM  Dressing Type Transparent 08/18/2015  8:40 AM  Dressing Status Clean;Dry;Intact 08/18/2015  8:40 AM  Dressing Intervention New dressing 08/18/2015  8:30 AM  Dressing Change Due 08/25/15 08/18/2015  8:40 AM       Gordan Payment 08/18/2015, 8:50 AM

## 2015-08-18 NOTE — Progress Notes (Signed)
Pharmacy Antibiotic Note  Justin [name] is a 60 y.o. male admitted on 08/15/2015 with pneumonia.  Pharmacy has been consulted for vancomycin dosing.  Now abx day #4 for sepsis from PNA/cellulitis of penis/scrotum. Acyclovir (PT and TP) from PTA for genital herpes.  Hx E.coli and Kleb PNA. Afebrile, WBC wnl.  Vanc 750mg  Q48H PTA. SCr down to 1.55 (BL SCr ~1.3), CrCl ~45 ml/min.  Plan: Continue ceftazidime 2g IV Q12 Change vancomycin to 750mg  IV Q24 starting tomorrow Monitor clinical picture, renal function, VT at Css F/U C&S, abx deescalation / LOT  Height: 5\' 4"  (162.6 cm) Weight: 177 lb 0.5 oz (80.3 kg) IBW/kg (Calculated) : 59.2  Temp (24hrs), Avg:97.5 F (36.4 C), Min:97.4 F (36.3 C), Max:97.6 F (36.4 C)   Recent Labs Lab 08/15/15 1239 08/15/15 1254  08/15/15 1639 08/15/15 1640 08/15/15 1645  08/16/15 0513  08/16/15 1653 08/17/15 0106 08/17/15 1239 08/18/15 0527 08/18/15 0835 08/18/15 1215  WBC 8.0  --   --  7.4  --   --   --  6.7  --   --   --   --  4.2  --   --   CREATININE 2.91*  --   < >  --   --   --   < > 2.61*  < > 2.22* 2.14* 2.18*  --  1.60* 1.55*  LATICACIDVEN  --  2.74*  --   --  2.0 1.98  --   --   --   --   --   --   --   --   --   VANCOTROUGH  --   --   --   --   --   --   --   --   --   --   --   --   --   --  29*  < > = values in this interval not displayed.  Estimated Creatinine Clearance: 47.9 mL/min (by C-G formula based on Cr of 1.55).    Allergies  Allergen Reactions  . Amoxicillin Other (See Comments)    ON MAR  . Ampicillin Other (See Comments)    ON MAR  . Latex Other (See Comments)    ON MAR  . Morphine And Related Other (See Comments)    UNK  . Penicillins Other (See Comments)    ON MAR; tolerated Ceftriaxone 8/27-9/1  . Tape Other (See Comments)    ON MAR    Antimicrobials this admission: vanc PTA (2/10?) >>  ceftaz 2/11 >>  Dose adjustments this admission: 2/14 VT = 29 ( Drawn early but still elevated)  Microbiology  results: 2/11 BCx: sent 2/11 UCx: sent   Thank you for allowing pharmacy to be a part of this patient's care.  Elenor Quinones, PharmD, BCPS Clinical Pharmacist Pager [phone] 08/18/2015 2:12 PM

## 2015-08-18 NOTE — Progress Notes (Signed)
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 Patient is had a number of admissions for sepsis from different infections and then  Was hospitalized in December 2016 to respiratory failure secondary to aspiration pneumonia.   Patient was admitted on 2/11 after coming in from his nursing facility with  Being nonresponsive , sodium of 165 and initially required high flow oxygen. He was able to be stabilized in ICU. Patient was started on D5W as well as antibiotics for presumptive treatment of cellulitis of penis. He did show some improvement and was felt to be a bit more stable and transferred to the floor.   Patient was made DO NOT RESUSCITATE after various attempts to contact medical power of attorney, however,  Critical care who knew patient well felt that if the patient arrested or declined , any resuscitation efforts would be futile and contribute to increased suffering. Transferred to hospitalist service on 2/13.  Today, patient little bit more awake. Denies any pain or shortness of breath. 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 Checking magnesium level.   Metabolic encephalopathy: In the setting of previous intracranial hemorrhage and brain radiation: Improving, we'll discuss with family as to how close he is to baseline   penile cellulitis: Continue antibiotics. Patient appears stable. No signs of sepsis. White count normalized. No fever.Likely will stop antibiotics on 2/16    Code Status: DNR  Family Communication:Left message with daughter, have not spoken to anyone in person yet   Disposition Plan: Cont inpatient, anticipate return to SNF in few days once sodium normalized and tube feeds resumed.   Consultants:  Critical care   Procedures:  PICC line placement done 2/13 for IV access   Antibiotics:  IV Fortaz and vancomycin 2/11 present   Objective: BP 119/60 mmHg  Pulse 76  Temp(Src) 97.4 F (36.3 C) (Axillary)  Resp 16  Ht 5\' 4"  (1.626 m)  Wt 80.3 kg (177 lb 0.5 oz)  BMI 30.37 kg/m2  SpO2 98%  Intake/Output Summary (Last 24 hours) at 08/18/15 1533 Last data filed at 08/18/15 0900  Gross per 24 hour  Intake   2075 ml  Output      0 ml  Net   2075 ml   Filed Weights   08/16/15 0442 08/17/15 0421 08/18/15 0409  Weight: 80.2 kg (176 lb 12.9 oz) 79.6 kg (175 lb  7.8 oz) 80.3 kg (177 lb 0.5 oz)    Exam:   General:    more alert, oriented 1   Cardiovascular: Regular rate and rhythm, S1-S2    Respiratory: Poor inspiratory effort    Abdomen: Soft, non-distended, hypoactive bowel sounds    Musculoskeletal: Trace edema bilaterally    Data Reviewed: Basic Metabolic  Panel:  Recent Labs Lab 08/15/15 1639  08/16/15 1653 08/17/15 0106 08/17/15 1239 08/18/15 0835 08/18/15 1215  NA  --   < > 158* 156* 157* 153* 153*  K  --   < > 3.9 4.0 >7.5* 3.3* 2.8*  CL  --   < > 119* 119* 122* 112* 114*  CO2  --   < > 32 29 20* 30 30  GLUCOSE  --   < > 177* 268* 204* 78 73  BUN  --   < > 87* 80* 76* 53* 52*  CREATININE  --   < > 2.22* 2.14* 2.18* 1.60* 1.55*  CALCIUM  --   < > 9.8 10.0 10.0 9.9 9.8  MG 3.0*  --   --   --   --   --   --   PHOS 3.1  --   --   --   --   --   --   < > = values in this interval not displayed. Liver Function Tests:  Recent Labs Lab 08/15/15 1239  AST 50*  ALT 30  ALKPHOS 67  BILITOT 1.0  PROT 7.9  ALBUMIN 2.3*    Recent Labs Lab 08/15/15 1639  LIPASE 29  AMYLASE 43   No results for input(s): AMMONIA in the last 168 hours. CBC:  Recent Labs Lab 08/15/15 1239 08/15/15 1402 08/15/15 1639 08/16/15 0513 08/18/15 0527  WBC 8.0  --  7.4 6.7 4.2  NEUTROABS 5.6  --   --   --   --   HGB 14.5 14.6 13.0 11.2* 10.9*  HCT 52.9* 43.0 48.3 42.7 38.2*  MCV 96.2  --  96.4 95.7 89.3  PLT 147*  --  148* 163 84*   Cardiac Enzymes:    Recent Labs Lab 08/15/15 1639  TROPONINI 0.23*   BNP (last 3 results) No results for input(s): BNP in the last 8760 hours.  ProBNP (last 3 results) No results for input(s): PROBNP in the last 8760 hours.  CBG:  Recent Labs Lab 08/17/15 2351 08/18/15 0334 08/18/15 0724 08/18/15 1205 08/18/15 1323  GLUCAP 169* 104* 71 67 119*    Recent Results (from the past 240 hour(s))  Urine culture     Status: None   Collection Time: 08/15/15 12:17 PM  Result Value Ref Range Status   Specimen Description URINE, CATHETERIZED  Final   Special Requests NONE  Final   Culture 10,000 COLONIES/mL ESCHERICHIA COLI  Final   Report Status 08/17/2015 FINAL  Final   Organism ID, Bacteria ESCHERICHIA COLI  Final      Susceptibility   Escherichia coli - MIC*    AMPICILLIN >=32 RESISTANT Resistant      CEFAZOLIN <=4 SENSITIVE Sensitive     CEFTRIAXONE <=1 SENSITIVE Sensitive     CIPROFLOXACIN >=4 RESISTANT Resistant     GENTAMICIN >=16 RESISTANT Resistant     IMIPENEM <=0.25 SENSITIVE Sensitive     NITROFURANTOIN <=16 SENSITIVE Sensitive     TRIMETH/SULFA <=20 SENSITIVE Sensitive     AMPICILLIN/SULBACTAM 16 INTERMEDIATE Intermediate     PIP/TAZO <=4 SENSITIVE Sensitive     * 10,000 COLONIES/mL  ESCHERICHIA COLI  Blood Culture (routine x 2)     Status: None (Preliminary result)   Collection Time: 08/15/15 12:39 PM  Result Value Ref Range Status   Specimen Description BLOOD RIGHT HAND  Final   Special Requests BOTTLES DRAWN AEROBIC AND ANAEROBIC 3CC  Final   Culture NO GROWTH 2 DAYS  Final   Report Status PENDING  Incomplete  Blood Culture (routine x 2)     Status: None (Preliminary result)   Collection Time: 08/15/15  4:40 PM  Result Value Ref Range Status   Specimen Description BLOOD RIGHT HAND  Final   Special Requests PEDIATRICS 4CC  Final   Culture NO GROWTH 2 DAYS  Final   Report Status PENDING  Incomplete  MRSA PCR Screening     Status: Abnormal   Collection Time: 08/15/15  5:55 PM  Result Value Ref Range Status   MRSA by PCR POSITIVE (A) NEGATIVE Final    Comment:        The GeneXpert MRSA Assay (FDA approved for NASAL specimens only), is one component of a comprehensive MRSA colonization surveillance program. It is not intended to diagnose MRSA infection nor to guide or monitor treatment for MRSA infections. RESULT CALLED TO, READ BACK BY AND VERIFIED WITH: SMITH,V RN 08/15/15 1956 [place]      Studies: No results found.  Scheduled Meds: . acyclovir  200 mg Per Tube BID  . acyclovir ointment   Topical Q3H  . antiseptic oral rinse  7 mL Mouth Rinse BID  . cefTAZidime (FORTAZ)  IV  2 g Intravenous Q12H  . Chlorhexidine Gluconate Cloth  6 each Topical Q0600  . free water  200 mL Per Tube 6 times per day  . heparin subcutaneous  5,000 Units Subcutaneous 3  times per day  . hydrocortisone sod succinate (SOLU-CORTEF) inj  25 mg Intravenous Q8H  . insulin aspart  0-20 Units Subcutaneous 6 times per day  . insulin glargine  12 Units Subcutaneous QHS  . mupirocin ointment  1 application Nasal BID  . pantoprazole sodium  40 mg Per Tube QHS  . potassium chloride  40 mEq Per Tube BID  . sodium chloride flush  10-40 mL Intracatheter Q12H  . [START ON 08/19/2015] vancomycin  750 mg Intravenous Q24H    Continuous Infusions: . dextrose 75 mL/hr at 08/18/15 [id number]     Time spent: 25 minutes  [place] Pager [phone] . If 7PM-7AM, please contact night-coverage at [email or web address], password [facility] 08/18/2015, 3:33 PM  LOS: 3 days

## 2015-08-19 DIAGNOSIS — E870 Hyperosmolality and hypernatremia: Principal | ICD-10-CM

## 2015-08-19 LAB — GLUCOSE, CAPILLARY
GLUCOSE-CAPILLARY: 217 mg/dL — AB (ref 65–99)
GLUCOSE-CAPILLARY: 262 mg/dL — AB (ref 65–99)
GLUCOSE-CAPILLARY: 84 mg/dL (ref 65–99)
GLUCOSE-CAPILLARY: 84 mg/dL (ref 65–99)
Glucose-Capillary: 226 mg/dL — ABNORMAL HIGH (ref 65–99)
Glucose-Capillary: 147 mg/dL — ABNORMAL HIGH (ref 65–99)

## 2015-08-19 LAB — BASIC METABOLIC PANEL
ANION GAP: 7 (ref 5–15)
BUN: 47 mg/dL — ABNORMAL HIGH (ref 6–20)
CALCIUM: 9.3 mg/dL (ref 8.9–10.3)
CO2: 29 mmol/L (ref 22–32)
Chloride: 110 mmol/L (ref 101–111)
Creatinine, Ser: 1.6 mg/dL — ABNORMAL HIGH (ref 0.61–1.24)
GFR, EST AFRICAN AMERICAN: 52 mL/min — AB (ref >60)
GFR, EST NON AFRICAN AMERICAN: 45 mL/min — AB (ref >60)
GLUCOSE: 266 mg/dL — AB (ref 65–99)
POTASSIUM: 3.4 mmol/L — AB (ref 3.5–5.1)
SODIUM: 146 mmol/L — AB (ref 135–145)

## 2015-08-19 MED ORDER — HYDROCORTISONE NA SUCCINATE PF 100 MG IJ SOLR
25.0000 mg | Freq: Two times a day (BID) | INTRAMUSCULAR | Status: DC
Start: 2015-08-20 — End: 2015-08-21
  Administered 2015-08-20: 25 mg via INTRAVENOUS
  Filled 2015-08-19: qty 2

## 2015-08-19 MED ORDER — MORPHINE SULFATE (PF) 2 MG/ML IV SOLN: 1.0000 mg | INTRAVENOUS | Status: DC | PRN

## 2015-08-19 MED ORDER — HYDROMORPHONE HCL 1 MG/ML IJ SOLN
0.5000 mg | INTRAMUSCULAR | Status: DC | PRN
  Administered 2015-08-20: 0.5 mg via INTRAVENOUS
  Filled 2015-08-19: qty 1

## 2015-08-19 NOTE — Clinical Social Work Note (Signed)
Clinical Social Work Assessment  Patient Details  Name: Justin [name] MRN: [id number] Date of Birth: 1955/08/01  Date of referral:  08/19/15               Reason for consult:  Facility Placement                Permission sought to share information with:  Family Supports Permission granted to share information::  Yes, Verbal Permission Granted  Name::     April Black  Relationship::  Daughter  Contact Information:  [phone]  Housing/Transportation Living arrangements for the past 2 months:  [place] of Information:  Adult Children Patient Interpreter Needed:  None Criminal Activity/Legal Involvement Pertinent to Current Situation/Hospitalization:  No - Comment as needed Significant Relationships:  Adult Children Lives with:  Facility Resident Do you feel safe going back to the place where you live?  Yes Need for family participation in patient care:  Yes (Comment)  Care giving concerns:  Patient daughter agreeable with patient return to [facility] and will pursue alternative placement after discharge.   Social Worker assessment / plan:  Holiday representative spoke with patient daughter over the phone to offer support and discuss patient needs at discharge.  Patient daughter states that patient is a long term resident at [facility] and plans to return at discharge.  Patient daughter exploring other options but agreeable with return in the interim.  CSW contacted facility who is agreeable with patient return once medically stable.  CSW remains available for support and to facilitate patient discharge needs once medically stable.  Employment status:  Disabled (Comment on whether or not currently receiving Disability) Insurance information:  Managed Medicare PT Recommendations:  Not assessed at this time Information / Referral to community resources:  [place]  Patient/Family's Response to care:  Patient daughter  verbalized her understanding and appreciation for CSW support and concern.  Patient daughter agreeable with patient return to [facility].  Patient/Family's Understanding of and Emotional Response to Diagnosis, Current Treatment, and Prognosis:  Patient daughter did not verbalize or engage in enough conversation to determine her understanding of patient current medical needs.  Emotional Assessment Appearance:  Appears older than stated age Attitude/Demeanor/Rapport:  Unable to Assess Affect (typically observed):  Unable to Assess Orientation:   (Unable to assess - patient nonverbal) Alcohol / Substance use:  Not Applicable Psych involvement (Current and /or in the community):  No (Comment)  Discharge Needs  Concerns to be addressed:  Discharge Planning Concerns Readmission within the last 30 days:  Yes Current discharge risk:  None Barriers to Discharge:  Continued Medical Work up   [employer], [place]

## 2015-08-19 NOTE — NC FL2 (Signed)
[place] LEVEL OF CARE SCREENING TOOL     IDENTIFICATION  Patient Name: Justin [name] Birthdate: [birth year] Sex: male Admission Date (Current Location): 08/15/2015  [facility] and [place] Number:  Herbalist and Address:  [facility]. [facility], [place] [address] [place], [place] [address]      Provider Number: [id number]  Attending Physician Name and Address:  Nita Sells, MD  Relative Name and Phone Number:       Current Level of Care: Hospital Recommended Level of Care: [place] Prior Approval Number:    Date Approved/Denied:   PASRR Number: [id number]  Discharge Plan: SNF    Current Diagnoses: Patient Active Problem List   Diagnosis Date Noted  . Respiratory failure ([place]) 08/15/2015  . Acute hypernatremia   . Acute renal failure ([place])   . HSV-1 infection   . Normocytic anemia   . Type 2 HSV infection of penis   . Hemorrhage, intracranial ([place])   . Palliative care encounter   . Pneumonia due to Escherichia coli ([place])   . Genital herpes   . Chronic diarrhea   . Diabetes mellitus type 2 in nonobese (HCC)   . Sacral decubitus ulcer   . Scrotal ulcer   . Lower extremity ulceration ([place])   . Seizures ([place])   . 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 Pressure ulcer 06/14/2015  . Hyperglycemia   . S/p cadaver renal transplant 04/05/2015  . Chronic systolic CHF (congestive heart failure) ([place]) 04/05/2015  . Penile ulcer 04/05/2015  . GERD (gastroesophageal reflux disease) 02/26/2015  . Depression 02/26/2015  . Gout   . Renal transplant, status post 10/19/2014  . Failure to thrive in adult 10/19/2014  . Secondary diabetes with peripheral vascular disease ([place]) 06/16/2014  . Foley catheter in place 05/29/2013  . CARDIOMYOPATHY OTHER DISEASES CLASSIFIED ELSW 07/14/2010  . Diabetes mellitus ([place]) 05/03/2010  . Hyperlipidemia 05/03/2010  . Essential hypertension 05/03/2010  . Intracranial hemorrhage ([place])  05/03/2010  . Peripheral vascular disease ([place]) 05/03/2010  . HEMORRHOIDS 05/03/2010  . KIDNEY DISEASE 05/03/2010  . HYPERPARATHYROIDISM, HX OF 05/03/2010  . CARPAL TUNNEL SYNDROME, HX OF 05/03/2010    Orientation RESPIRATION BLADDER Height & Weight      (Unable to Assess - patient non conversant)  Normal Incontinent Weight: 177 lb 0.5 oz (80.3 kg) Height:  5\' 4"  (162.6 cm)  BEHAVIORAL SYMPTOMS/MOOD NEUROLOGICAL BOWEL NUTRITION STATUS      Incontinent Feeding tube  AMBULATORY STATUS COMMUNICATION OF NEEDS Skin   Extensive Assist Does not communicate PU Stage and Appropriate Care PU Stage 1 Dressing:  (Sacrum - PRN) PU Stage 2 Dressing:  (Right Ischial tuberosity - Change Every 3 Days) PU Stage 3 Dressing:  (Elbow - PRN)                 Personal Care Assistance Level of Assistance  Bathing, Dressing, Feeding Bathing Assistance: Maximum assistance Feeding assistance: Limited assistance Dressing Assistance: Maximum assistance     Functional Limitations Info  Sight, Hearing, Speech Sight Info: Adequate Hearing Info: Adequate Speech Info: Impaired    SPECIAL CARE FACTORS FREQUENCY                       Contractures Contractures Info: Not present    Additional Factors Info  Code Status, Allergies Code Status Info: DNR Allergies Info: Amoxicillin, Ampicillin, Latex, Morphine And Related, Penicillins, Tape  Current Medications (08/19/2015):  This is the current hospital active medication list Current Facility-Administered Medications  Medication Dose Route Frequency Provider Last Rate Last Dose  . acyclovir (ZOVIRAX) 200 MG/5ML suspension SUSP 200 mg  200 mg Per Tube BID Grace Bushy Minor, NP   200 mg at 08/19/15 1300  . acyclovir ointment (ZOVIRAX) 5 %   Topical Q3H Raylene Miyamoto, MD      . antiseptic oral rinse (CPC / CETYLPYRIDINIUM CHLORIDE 0.05%) solution 7 mL  7 mL Mouth Rinse BID Raylene Miyamoto, MD   7 mL at 08/18/15 2221  . cefTAZidime  (FORTAZ) 2 g in dextrose 5 % 50 mL IVPB  2 g Intravenous Q12H Lauren D Bajbus, RPH   2 g at 08/19/15 1304  . Chlorhexidine Gluconate Cloth 2 % PADS 6 each  6 each Topical Q0600 Annita Brod, MD   6 each at 08/19/15 [phone]  . dextrose 5 % solution   Intravenous Continuous Raylene Miyamoto, MD 75 mL/hr at 08/18/15 [phone]    . free water 200 mL  200 mL Per Tube 6 times per day Anders Simmonds, MD   200 mL at 08/19/15 1301  . hydrocortisone sodium succinate (SOLU-CORTEF) 100 MG injection 25 mg  25 mg Intravenous Q8H Annita Brod, MD   25 mg at 08/19/15 1454  . insulin aspart (novoLOG) injection 0-20 Units  0-20 Units Subcutaneous 6 times per day Grace Bushy Minor, NP   7 Units at 08/19/15 0901  . insulin glargine (LANTUS) injection 10 Units  10 Units Subcutaneous QHS Annita Brod, MD   10 Units at 08/18/15 2218  . ketorolac (TORADOL) tablet 10 mg  10 mg Oral Q6H PRN Dimas Chyle, MD   10 mg at 08/19/15 1300  . mupirocin ointment (BACTROBAN) 2 % 1 application  1 application Nasal BID Annita Brod, MD   1 application at [id number] [phone]  . pantoprazole sodium (PROTONIX) 40 mg/20 mL oral suspension 40 mg  40 mg Per Tube QHS Raylene Miyamoto, MD   40 mg at 08/18/15 2217  . sodium chloride flush (NS) 0.9 % injection 10-40 mL  10-40 mL Intracatheter Q12H Annita Brod, MD      . sodium chloride flush (NS) 0.9 % injection 10-40 mL  10-40 mL Intracatheter PRN Annita Brod, MD      . vancomycin (VANCOCIN) IVPB 750 mg/150 ml premix  750 mg Intravenous Q24H Cecilio Asper Batchelder, RPH   750 mg at 08/19/15 0606     Discharge Medications: Please see discharge summary for a list of discharge medications.  Relevant Imaging Results:  Relevant Lab Results:   Additional Information SSN [id number]  Barbette Or, [place]

## 2015-08-19 NOTE — Progress Notes (Signed)
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 Patient is had a number of admissions for sepsis from different infections and then  Was hospitalized in December 2016 to respiratory failure secondary to aspiration pneumonia.   Patient was admitted on 2/11 after coming in from his nursing facility with  Being nonresponsive , sodium of 165 and initially required high flow oxygen. He was able to be stabilized in ICU. Patient was started on D5W as well as antibiotics for presumptive treatment of cellulitis of penis. He did show some improvement and was felt to be a bit more stable and transferred to the floor.   Patient was made DO NOT RESUSCITATE after various attempts to contact medical power of attorney, however,  Critical care who knew patient well felt that if the patient arrested or declined , any resuscitation efforts would be futile and contribute to increased suffering. Transferred to hospitalist service on 2/13.  Today, patient little bit more awake. Denies any pain or shortness of breath. Sodium continues to trend down  Assessment/Plan: Active Problems:   Acute Respiratory failure with hypoxia ([place]): Secondary to Initial obtundation from hypernatremia and cellulitis   Acute hypernatremia: slowly improving, continue D5W--cut back rate from 75-50 cc per  hour and discontinue in 12 hours Cut back stress dose steroids 25 milligrams Solu-Cortef every 8-->every 12 2/15    Acute renal failure (HCC)/AKI  Inpatient with history of  stage II chronic kidney disease and cadaver transplant: Hydrating  chronic systolic CHF: monitoring strict input and output, for now trying to fluid resuscitate Discontinue Toradol   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 Patient appears stable. No signs of sepsis. White count normalized. No fever Discontinued broad-spectrum antibiotics 2/15 Continue however acyclovir for 10-14 days    Code Status: DNR  Family Communication:Left message with daughter, have not spoken to anyone in person yet   Disposition Plan: Cont inpatient, anticipate return to SNF in few days once sodium normalized and tube feeds resumed.   Consultants:  Critical care   Procedures:  PICC line placement done 2/13 for IV access   Antibiotics:  IV Fortaz and vancomycin 2/11 -->2/15  Acyclovir 2/11   Objective: BP 124/93 mmHg  Pulse 72  Temp(Src) 97.2 F (36.2 C) (Oral)  Resp 16  Ht 5\' 4"  (1.626 m)  Wt 80.3 kg (177 lb 0.5 oz)  BMI 30.37 kg/m2  SpO2 98%  Intake/Output Summary (Last 24 hours) at 08/19/15 1652 Last data filed at 08/18/15 1815  Gross per 24 hour  Intake      0 ml  Output      0 ml  Net      0 ml   Filed Weights   08/16/15 0442 08/17/15 0421 08/18/15 0409  Weight: 80.2 kg (176 lb 12.9 oz) 79.6 kg (175 lb 7.8  oz) 80.3 kg (177 lb 0.5 oz)    Exam:   General:    more alert, oriented 1 , echolalia  Cardiovascular: Regular rate and rhythm, S1-S2    Respiratory: Poor inspiratory effort    Abdomen: Soft, non-distended, hypoactive bowel sounds    Musculoskeletal: Trace edema bilaterally     Data Reviewed: Basic Metabolic Panel:  Recent Labs Lab 08/15/15 1639  08/17/15 0106 08/17/15 1239 08/18/15 0835 08/18/15 1215 08/18/15 1550 08/19/15 0500  NA  --   < > 156* 157* 153* 153*  --  146*  K  --   < > 4.0 >7.5* 3.3* 2.8*  --  3.4*  CL  --   < > 119* 122* 112* 114*  --  110  CO2  --   < > 29 20* 30 30  --  29  GLUCOSE  --   < > 268* 204* 78 73  --  266*  BUN  --   < > 80* 76* 53* 52*  --  47*  CREATININE  --   < > 2.14* 2.18* 1.60* 1.55*  --  1.60*  CALCIUM  --   < > 10.0 10.0 9.9 9.8  --  9.3  MG 3.0*  --   --   --   --   --  2.6*  --   PHOS 3.1  --   --   --   --   --   --   --   < > = values in this interval not displayed. Liver Function Tests:  Recent Labs Lab 08/15/15 1239  AST 50*  ALT 30  ALKPHOS 67  BILITOT 1.0  PROT 7.9  ALBUMIN 2.3*    Recent Labs Lab 08/15/15 1639  LIPASE 29  AMYLASE 43   No results for input(s): AMMONIA in the last 168 hours. CBC:  Recent Labs Lab 08/15/15 1239 08/15/15 1402 08/15/15 1639 08/16/15 0513 08/18/15 0527  WBC 8.0  --  7.4 6.7 4.2  NEUTROABS 5.6  --   --   --   --   HGB 14.5 14.6 13.0 11.2* 10.9*  HCT 52.9* 43.0 48.3 42.7 38.2*  MCV 96.2  --  96.4 95.7 89.3  PLT 147*  --  148* 163 84*   Cardiac Enzymes:    Recent Labs Lab 08/15/15 1639  TROPONINI 0.23*   BNP (last 3 results) No results for input(s): BNP in the last 8760 hours.  ProBNP (last 3 results) No results for input(s): PROBNP in the last 8760 hours.  CBG:  Recent Labs Lab 08/18/15 2011 08/19/15 0229 08/19/15 0514 08/19/15 0807 08/19/15 1321  GLUCAP 142* 217* 262* 226* 84    Recent Results (from the past 240 hour(s))  Urine culture      Status: None   Collection Time: 08/15/15 12:17 PM  Result Value Ref Range Status   Specimen Description URINE, CATHETERIZED  Final   Special Requests NONE  Final   Culture 10,000 COLONIES/mL ESCHERICHIA COLI  Final   Report Status 08/17/2015 FINAL  Final   Organism ID, Bacteria ESCHERICHIA COLI  Final      Susceptibility   Escherichia coli - MIC*    AMPICILLIN >=  32 RESISTANT Resistant     CEFAZOLIN <=4 SENSITIVE Sensitive     CEFTRIAXONE <=1 SENSITIVE Sensitive     CIPROFLOXACIN >=4 RESISTANT Resistant     GENTAMICIN >=16 RESISTANT Resistant     IMIPENEM <=0.25 SENSITIVE Sensitive     NITROFURANTOIN <=16 SENSITIVE Sensitive     TRIMETH/SULFA <=20 SENSITIVE Sensitive     AMPICILLIN/SULBACTAM 16 INTERMEDIATE Intermediate     PIP/TAZO <=4 SENSITIVE Sensitive     * 10,000 COLONIES/mL ESCHERICHIA COLI  Blood Culture (routine x 2)     Status: None (Preliminary result)   Collection Time: 08/15/15 12:39 PM  Result Value Ref Range Status   Specimen Description BLOOD RIGHT HAND  Final   Special Requests BOTTLES DRAWN AEROBIC AND ANAEROBIC 3CC  Final   Culture NO GROWTH 4 DAYS  Final   Report Status PENDING  Incomplete  Blood Culture (routine x 2)     Status: None (Preliminary result)   Collection Time: 08/15/15  4:40 PM  Result Value Ref Range Status   Specimen Description BLOOD RIGHT HAND  Final   Special Requests PEDIATRICS 4CC  Final   Culture NO GROWTH 4 DAYS  Final   Report Status PENDING  Incomplete  MRSA PCR Screening     Status: Abnormal   Collection Time: 08/15/15  5:55 PM  Result Value Ref Range Status   MRSA by PCR POSITIVE (A) NEGATIVE Final    Comment:        The GeneXpert MRSA Assay (FDA approved for NASAL specimens only), is one component of a comprehensive MRSA colonization surveillance program. It is not intended to diagnose MRSA infection nor to guide or monitor treatment for MRSA infections. RESULT CALLED TO, READ BACK BY AND VERIFIED WITH: SMITH,V RN  08/15/15 [address] [place]      Studies: No results found.  Scheduled Meds: . acyclovir  200 mg Per Tube BID  . acyclovir ointment   Topical Q3H  . antiseptic oral rinse  7 mL Mouth Rinse BID  . cefTAZidime (FORTAZ)  IV  2 g Intravenous Q12H  . Chlorhexidine Gluconate Cloth  6 each Topical Q0600  . free water  200 mL Per Tube 6 times per day  . hydrocortisone sod succinate (SOLU-CORTEF) inj  25 mg Intravenous Q8H  . insulin aspart  0-20 Units Subcutaneous 6 times per day  . insulin glargine  10 Units Subcutaneous QHS  . mupirocin ointment  1 application Nasal BID  . pantoprazole sodium  40 mg Per Tube QHS  . sodium chloride flush  10-40 mL Intracatheter Q12H  . vancomycin  750 mg Intravenous Q24H    Continuous Infusions: . dextrose 75 mL/hr at 08/18/15 [id number]     Time spent: 25 minutes  Verneita Griffes, MD [facility] [phone]

## 2015-08-19 NOTE — Progress Notes (Signed)
Inpatient Diabetes Program Recommendations  AACE/ADA: New Consensus Statement on Inpatient Glycemic Control (2015)  Target Ranges:  Prepandial:   less than 140 mg/dL      Peak postprandial:   less than 180 mg/dL (1-2 hours)      Critically ill patients:  140 - 180 mg/dL  Results for Justin [name], Justin [name] (MRN [id number]) as of 08/19/2015 12:39  Ref. Range 08/18/2015 07:24 08/18/2015 12:05 08/18/2015 13:23 08/18/2015 16:33 08/18/2015 16:43 08/18/2015 20:11 08/19/2015 02:29 08/19/2015 05:14 08/19/2015 08:07  Glucose-Capillary Latest Ref Range: 65-99 mg/dL 71 67 119 (H) 65 76 142 (H) 217 (H) 262 (H) 226 (H)   Review of Glycemic Control  Diabetes history: DM2 Outpatient Diabetes medications: NPH 15 units Q6H, Humalog 0-10 units Q6H Current orders for Inpatient glycemic control: Lantus 10 units QHS, Novolog 0-20 units Q4H  Inpatient Diabetes Program Recommendations:  Insulin - Basal: Noted, steroids frequency has been increased and glucose is more elevated today.  If steroids are continued as ordered (Solucortef 25 mg Q8H), please consider increasing Lantus back up to 12 units QHS.  Thanks, Barnie Alderman, RN, MSN, CDE Diabetes Coordinator Inpatient Diabetes Program [phone] (Team Pager from [place] to [place]) [phone] (AP office) [phone] [facility] office) [phone] [facility] office)

## 2015-08-20 DIAGNOSIS — I629 Nontraumatic intracranial hemorrhage, unspecified: Secondary | ICD-10-CM

## 2015-08-20 DIAGNOSIS — I10 Essential (primary) hypertension: Secondary | ICD-10-CM

## 2015-08-20 DIAGNOSIS — Z940 Kidney transplant status: Secondary | ICD-10-CM

## 2015-08-20 LAB — BASIC METABOLIC PANEL
Anion gap: 6 (ref 5–15)
BUN: 33 mg/dL — AB (ref 6–20)
CALCIUM: 9.1 mg/dL (ref 8.9–10.3)
CO2: 29 mmol/L (ref 22–32)
CREATININE: 1.38 mg/dL — AB (ref 0.61–1.24)
Chloride: 105 mmol/L (ref 101–111)
GFR calc Af Amer: >60 mL/min (ref >60)
GFR, EST NON AFRICAN AMERICAN: 54 mL/min — AB (ref >60)
GLUCOSE: 356 mg/dL — AB (ref 65–99)
POTASSIUM: 3.2 mmol/L — AB (ref 3.5–5.1)
SODIUM: 140 mmol/L (ref 135–145)

## 2015-08-20 LAB — CULTURE, BLOOD (ROUTINE X 2)
Culture: NO GROWTH
Culture: NO GROWTH

## 2015-08-20 LAB — GLUCOSE, CAPILLARY
GLUCOSE-CAPILLARY: 164 mg/dL — AB (ref 65–99)
GLUCOSE-CAPILLARY: 50 mg/dL — AB (ref 65–99)
GLUCOSE-CAPILLARY: 95 mg/dL (ref 65–99)
GLUCOSE-CAPILLARY: 83 mg/dL (ref 65–99)
GLUCOSE-CAPILLARY: 111 mg/dL — AB (ref 65–99)
Glucose-Capillary: 154 mg/dL — ABNORMAL HIGH (ref 65–99)
Glucose-Capillary: 183 mg/dL — ABNORMAL HIGH (ref 65–99)
Glucose-Capillary: 77 mg/dL (ref 65–99)

## 2015-08-20 MED ORDER — OSMOLITE 1.5 CAL PO LIQD: 1000.0000 mL | ORAL | Status: DC

## 2015-08-20 MED ORDER — FREE WATER: 200.0000 mL | Status: AC

## 2015-08-20 MED ORDER — COLLAGENASE 250 UNIT/GM EX OINT
TOPICAL_OINTMENT | Freq: Every day | CUTANEOUS | Status: DC
  Administered 2015-08-20 – 2015-08-21 (×2): via TOPICAL
  Filled 2015-08-20: qty 30

## 2015-08-20 MED ORDER — MUPIROCIN 2 % EX OINT: 1.0000 "application " | TOPICAL_OINTMENT | Freq: Two times a day (BID) | CUTANEOUS | Status: AC

## 2015-08-20 MED ORDER — OSMOLITE 1.5 CAL PO LIQD
1000.0000 mL | ORAL | Status: DC
  Administered 2015-08-20: 1000 mL
  Filled 2015-08-20: qty 1000

## 2015-08-20 NOTE — Clinical Social Work Note (Signed)
Discharge is being held today. CSW will assist with DC back to [facility] once patient is medically stable.   Liz Beach MSW, [place], [place], [id number]

## 2015-08-20 NOTE — Progress Notes (Signed)
Hypoglycemic Event  CBG: 50  Treatment: 15 GM carbohydrate snack: orange juicex2 through PEG tube per MD  Symptoms: None  Follow-up CBG: Time:1655 CBG Result: 95  Possible Reasons for Event: Medication Regimen: Off IV Dextrose, no tube feedings, NPO  Comments/MD notified:Dr. Samtani notified & will adjust orders.    Kathrine Cords D

## 2015-08-20 NOTE — Care Management Note (Signed)
Case Management Note  Patient Details  Name: Justin [name] MRN: [id number] Date of Birth: 1956/02/04  Subjective/Objective:                    Action/Plan:   Expected Discharge Date:                  Expected Discharge Plan:  Skilled Nursing Facility  In-House Referral:  Clinical Social Work  Discharge planning Services     Post Acute Care Choice:    Choice offered to:     DME Arranged:    DME Agency:     HH Arranged:    [place] Agency:     Status of Service:  In process, will continue to follow  Medicare Important Message Given:    Date Medicare IM Given:    Medicare IM give by:    Date Additional Medicare IM Given:    Additional Medicare Important Message give by:     If discussed at [place] of Stay Meetings, dates discussed:  08-20-15 Ur updated   Additional Comments:  Marilu Favre, RN 08/20/2015, 10:51 AM

## 2015-08-20 NOTE — Progress Notes (Signed)
Called and discussed with RN d/c insulin orders.  Discussed orders for NPH q 6 hours.  Note that patient has not been receiving this in the hospital.  Consider d/c of NPH? Test page sent to Dr. Verlon Au and discussed with RN.  Thanks, Adah Perl, RN, BC-ADM Inpatient Diabetes Coordinator Pager [phone] (8a-5p)

## 2015-08-20 NOTE — Discharge Summary (Addendum)
Physician Discharge Summary  JENCARLOS PLY [id number] DOB: 1956/06/24 DOA: 08/15/2015  PCP: Garwin Brothers, MD  Admit date: 08/15/2015 Discharge date: 08/20/2015  Time spent:  35 minutes  Recommendations for Outpatient Follow-up:   1.  recommend continuation of Acyclovir for suppressions of extensive skin lesions-would Rx for 14 more days and will need review as an OP to place back on suppressive therapy once active lesions have diminished. 2. Recommend tacrolimus level as an outpatient 3. Patient may resume tacrolimus and prednisone at prior to admission doses-was on stress dose steroids during hospital stay given the fact that the patient has had a  post cadaveric renal transplant 4. Recommend keeping groin area dry as moisture may cause bacterial proliferation 5.  Patient should have wounds on his bottom as well as sacrum debrided with Santyl and should be reviewed on a weekly/biweekly basis by primary physician 6. Patient should have lab work with basic metabolic panel + CBC in about one week 7. Continue free water 200 cc every 4 hours and encourage by mouth intake 8. Patient completed a 5 day course of IV Ceftaz andvVancomycin and it was felt superinfection of the areas was not present and these were discontinued promptly 9.  wound care instructions below  Discharge Diagnoses:  Active Problems:   Essential hypertension   Intracranial hemorrhage (HCC)   Renal transplant, status post   Chronic systolic CHF (congestive heart failure) ([place])   Sacral decubitus ulcer   Seizures (HCC)   Small B-cell lymphoma (HCC)   Respiratory failure (HCC)   Acute hypernatremia   Discharge Condition: fair to gaurded  Diet recommendation: HH low salt diabetic  Wound care recommendations Dressing procedure/placement/frequency:  1. Add enzymatic debridement ointment to the right lateral ankle wound to clean slough, apply and change daily. 2. Add Prevalon boots to offload heels, to be worn at all  times when in the bed 3. Add calicum alginate to the right heel to dry up more/cover with foam. Change every 3 days. 4. Silicone foam to protect the sacrum, change every 3 days. 5. Silver hydrofiber to the right elbow, cover with foam, change every 3 days.  6. Needs to have right elbow off the bed on pillows all the time to prevent further injury.   Filed Weights   08/17/15 0421 08/18/15 0409 08/20/15 0635  Weight: 79.6 kg (175 lb 7.8 oz) 80.3 kg (177 lb 0.5 oz) 84.1 kg (185 lb 6.5 oz)    History of present illness:  60 year old male Hypertension Peripheral vascular disease with DVT status post IVC filter Small B-cell lymphoma secondary to chronic immunosuppression following cadaveric transplant status post radiation Not a candidate for chemotherapy Prior history intracranial hemorrhage as well as cognitive deficit sets. Decubitus ulcers on scrotum and lower extremities presented to emergency room 08/15/15 from nursing home  Initially unresponsive, sodium 165 requiring high flow oxygen stabilized in ICU and treated for cellulitis and transferred to the floor   Hospital Course:   Acute Respiratory failure with hypoxia ([place]): Secondary to Initial obtundation from hypernatremia and cellulitis  Acute hypernatremia: slowly improving, continue D5W--cut back rate from 75-50 cc per hour and discontinue in 12 hours At nursing facility patient would benefit from 200 cc of free water given via PEG tube every 4 hours Cut back stress dose steroids 25 milligrams Solu-Cortef every 8-->every 12 2/15 Subsequently patient was placed back on prednisone baseline 5 mg daily on discharge   Acute renal failure (HCC)/AKI Inpatient with history of stage II chronic kidney  disease and cadaver transplant: Hydrating Patient was on Toradol which was discontinued Creatinine on discharge was [id number]  chronic systolic CHF: monitoring strict input and output, for now trying to fluid resuscitate Patient was net  + 4.6 L but without lower extremity edema or JVD  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  Patient appears stable. No signs of sepsis. White count normalized. No fever Discontinued broad-spectrum antibiotics 2/15 in no white count  Continue however acyclovir for 14 days  Stage I sacral decubitus Right heel 5 x 4 cm Right heel 2 x 2 malleolus Right elbow 2 and half by 2 cm decubitus   Consultations:  Wound care  Discharge Exam: Filed Vitals:   08/19/15 2250 08/20/15 0635  BP: 119/66 140/81  Pulse: 76 62  Temp: 97.4 F (36.3 C) 97.7 F (36.5 C)  Resp: 15 15    General: alert  Babbling with ecgholalia Cardiovascular: s1 s 2no m/r/g Respiratory: clear no added sound.  Multiple wounds noted as per above  Discharge Instructions   Discharge Instructions    Diet - low sodium heart healthy    Complete by:  As directed      Increase activity slowly    Complete by:  As directed           Current Discharge Medication List    START taking these medications   Details  mupirocin ointment (BACTROBAN) 2 % Place 1 application into the nose 2 (two) times daily. Qty: 22 g, Refills: 0    !! Water For Irrigation, Sterile (FREE WATER) SOLN Place 200 mLs into feeding tube every 4 (four) hours.     !! - Potential duplicate medications found. Please discuss with provider.    CONTINUE these medications which have  NOT CHANGED   Details  acyclovir (ZOVIRAX) 200 MG/5ML suspension Take 10 mLs (400 mg total) by mouth 2 (two) times daily. Qty: 120 mL, Refills: 0    aspirin 81 MG tablet Place 81 mg into feeding tube daily.     atorvastatin (LIPITOR) 10 MG tablet Place 10 mg into feeding tube daily.     cholecalciferol (VITAMIN D) 1000 units tablet 1,000 Units by Gastric Tube route daily.    citalopram (CELEXA) 10 MG tablet Place 20 mg into feeding tube daily.     Cranberry 425 MG CAPS Place 425 mg into feeding tube daily.     gabapentin (NEURONTIN) 250 MG/5ML solution Place 2 mLs (100 mg total) into feeding tube every 8 (eight) hours. Qty: 90 mL, Refills: 0    hydrALAZINE (APRESOLINE) 25 MG tablet 25 mg by PEG Tube route 2 (two) times daily.     insulin lispro (HUMALOG KWIKPEN) 100 UNIT/ML KiwkPen Inject 0-10 Units into the skin every 6 (six) hours. 0-150 (0 units)  151-200 (2 units) 201-250 (4 units) 251-300 (6 units)  301-350 (8 units) 400 (10 units)    insulin NPH Human (HUMULIN N,NOVOLIN N) 100 UNIT/ML injection Inject 0.15 mLs (15 Units total) into the skin every 6 (  six) hours. Qty: 10 mL, Refills: 11    ipratropium-albuterol (DUONEB) 0.5-2.5 (3) MG/3ML SOLN Take 3 mLs by nebulization 4 (four) times daily.    lamoTRIgine (LAMICTAL) 25 MG tablet Take 50 mg by mouth daily.    levETIRAcetam (KEPPRA) 100 MG/ML solution Place 5 mLs (500 mg total) into feeding tube 2 (two) times daily. Qty: 473 mL, Refills: 12    loperamide (IMODIUM) 1 MG/5ML solution Place 10 mLs (2 mg total) into feeding tube 4 (four) times daily. Qty: 120 mL, Refills: 0    Multiple Vitamins-Minerals (MULTIVITAMIN WITH MINERALS) tablet Take 1 tablet by mouth daily.    Nutritional Supplements (FEEDING SUPPLEMENT, GLUCERNA 1.5 CAL,) LIQD Place 90 mL/hr into feeding tube See admin instructions. 35ml/hr from 2p to 5a    Omega 3-6-9 Fatty Acids (OMEGA-3 FUSION) LIQD Take 15 mLs by mouth 2 (two) times daily.    ondansetron  (ZOFRAN) 4 MG tablet Take 4 mg by mouth every 8 (eight) hours as needed for nausea or vomiting.    Probiotic Product (PROBIOTIC PO) Take 1 capsule by mouth 2 (two) times daily.    promethazine (PHENERGAN) 25 MG/ML injection Inject 25 mg into the vein every 6 (six) hours as needed for nausea or vomiting.    SANTYL ointment Apply 1 application topically 2 (two) times daily.    sucralfate (CARAFATE) 1 GM/10ML suspension Place 1 g into feeding tube every 8 (eight) hours.     !! tacrolimus (PROGRAF) 0.5 MG capsule Take 0.5 mg by mouth 2 (two) times daily.    !! tacrolimus (PROGRAF) 1 MG capsule 3 mg by PEG Tube route 2 (two) times daily.     vancomycin (VANCOCIN) 750 MG SOLR injection Inject 750 mg into the vein every other day.    !! Water For Irrigation, Sterile (FREE WATER) SOLN Place 200 mLs into feeding tube 4 (four) times daily.     !! - Potential duplicate medications found. Please discuss with provider.    STOP taking these medications     Dextromethorphan-Quinidine 20-10 MG CAPS      nystatin (MYCOSTATIN) 100000 UNIT/ML suspension      predniSONE (DELTASONE) 5 MG tablet        Allergies  Allergen Reactions  . Amoxicillin Other (See Comments)    ON MAR  . Ampicillin Other (See Comments)    ON MAR  . Latex Other (See Comments)    ON MAR  . Morphine And Related Other (See Comments)    UNK  . Penicillins Other (See Comments)    ON MAR; tolerated Ceftriaxone 8/27-9/1  . Tape Other (See Comments)    ON MAR      The results of significant diagnostics from this hospitalization (including imaging, microbiology, ancillary and laboratory) are listed below for reference.    Significant Diagnostic Studies: Ct Head Wo Contrast  08/15/2015  CLINICAL DATA:  Altered mental status.  History of stroke EXAM: CT HEAD WITHOUT CONTRAST TECHNIQUE: Contiguous axial images were obtained from the base of the skull through the vertex without intravenous contrast. COMPARISON:  06/08/2015  FINDINGS: Skull and Sinuses:Frontal scalp fat infiltration is chronic and consistent with scar. There is diffuse chronic subcutaneous of the scalp. There is a remote left frontal burr hole. No acute fracture. Patchy left ethmoid opacification with clear nasopharynx. No acute sinusitis. Visualized orbits: Negative. Brain: Remote left MCA territory infarct with dense gliosis in the upper division territory and subcortical nuclei. Chronic small-vessel disease with ischemic gliosis around the lateral ventricles. Advanced  atrophy of the pons, even when accounting for wallerian degeneration. Cerebellum is likewise symmetrically atrophic. No evidence of acute hemorrhage, hydrocephalus, or acute infarct. IMPRESSION: 1. No acute finding or change from 2016. 2. Remote left MCA territory infarct. 3. Atrophy, severe at the level of the brainstem. Electronically Signed   By: Monte Fantasia M.D.   On: 08/15/2015 14:35   Dg Chest Portable 1 View  08/15/2015  CLINICAL DATA:  60 year old male with altered mental status. Question right arm PICC line. EXAM: PORTABLE CHEST 1 VIEW COMPARISON:  06/14/2015 and prior radiographs FINDINGS: This is a low volume film. Left basilar atelectasis is present. The cardiomediastinal silhouette is unchanged. There is no evidence of pneumothorax or definite pleural effusion. An endotracheal tube and NG tube is been removed. There is no evidence of right central venous catheter or PICC line on this study. IMPRESSION: Low volume film with left basilar atelectasis. No evidence of right PICC line or central venous catheter on this study. Electronically Signed   By: Margarette Canada M.D.   On: 08/15/2015 12:21    Microbiology: Recent Results (from the past 240 hour(s))  Urine culture     Status: None   Collection Time: 08/15/15 12:17 PM  Result Value Ref Range Status   Specimen Description URINE, CATHETERIZED  Final   Special Requests NONE  Final   Culture 10,000 COLONIES/mL ESCHERICHIA COLI   Final   Report Status 08/17/2015 FINAL  Final   Organism ID, Bacteria ESCHERICHIA COLI  Final      Susceptibility   Escherichia coli - MIC*    AMPICILLIN >=32 RESISTANT Resistant     CEFAZOLIN <=4 SENSITIVE Sensitive     CEFTRIAXONE <=1 SENSITIVE Sensitive     CIPROFLOXACIN >=4 RESISTANT Resistant     GENTAMICIN >=16 RESISTANT Resistant     IMIPENEM <=0.25 SENSITIVE Sensitive     NITROFURANTOIN <=16 SENSITIVE Sensitive     TRIMETH/SULFA <=20 SENSITIVE Sensitive     AMPICILLIN/SULBACTAM 16 INTERMEDIATE Intermediate     PIP/TAZO <=4 SENSITIVE Sensitive     * 10,000 COLONIES/mL ESCHERICHIA COLI  Blood Culture (routine x 2)     Status: None   Collection Time: 08/15/15 12:39 PM  Result Value Ref Range Status   Specimen Description BLOOD RIGHT HAND  Final   Special Requests BOTTLES DRAWN AEROBIC AND ANAEROBIC 3CC  Final   Culture NO GROWTH 5 DAYS  Final   Report Status 08/20/2015 FINAL  Final  Blood Culture (routine x 2)     Status: None   Collection Time: 08/15/15  4:40 PM  Result Value Ref Range Status   Specimen Description BLOOD RIGHT HAND  Final   Special Requests PEDIATRICS 4CC  Final   Culture NO GROWTH 5 DAYS  Final   Report Status 08/20/2015 FINAL  Final  MRSA PCR Screening     Status: Abnormal   Collection Time: 08/15/15  5:55 PM  Result Value Ref Range Status   MRSA by PCR POSITIVE (A) NEGATIVE Final    Comment:        The GeneXpert MRSA Assay (FDA approved for NASAL specimens only), is one component of a comprehensive MRSA colonization surveillance program. It is not intended to diagnose MRSA infection nor to guide or monitor treatment for MRSA infections. RESULT CALLED TO, READ BACK BY AND VERIFIED WITH: SMITH,V RN 08/15/15 1956 WOOTEN,K      Labs: Basic Metabolic Panel:  Recent Labs Lab 08/15/15 1639  08/17/15 1239 08/18/15 0835 08/18/15 1215 08/18/15 1550  08/19/15 0500 08/20/15 0515  NA  --   < > 157* 153* 153*  --  146* 140  K  --   < > >7.5*  3.3* 2.8*  --  3.4* 3.2*  CL  --   < > 122* 112* 114*  --  110 105  CO2  --   < > 20* 30 30  --  29 29  GLUCOSE  --   < > 204* 78 73  --  266* 356*  BUN  --   < > 76* 53* 52*  --  47* 33*  CREATININE  --   < > 2.18* 1.60* 1.55*  --  1.60* 1.38*  CALCIUM  --   < > 10.0 9.9 9.8  --  9.3 9.1  MG 3.0*  --   --   --   --  2.6*  --   --   PHOS 3.1  --   --   --   --   --   --   --   < > = values in this interval not displayed. Liver Function Tests:  Recent Labs Lab 08/15/15 1239  AST 50*  ALT 30  ALKPHOS 67  BILITOT 1.0  PROT 7.9  ALBUMIN 2.3*    Recent Labs Lab 08/15/15 1639  LIPASE 29  AMYLASE 43   No results for input(s): AMMONIA in the last 168 hours. CBC:  Recent Labs Lab 08/15/15 1239 08/15/15 1402 08/15/15 1639 08/16/15 0513 08/18/15 0527  WBC 8.0  --  7.4 6.7 4.2  NEUTROABS 5.6  --   --   --   --   HGB 14.5 14.6 13.0 11.2* 10.9*  HCT 52.9* 43.0 48.3 42.7 38.2*  MCV 96.2  --  96.4 95.7 89.3  PLT 147*  --  148* 163 84*   Cardiac Enzymes:  Recent Labs Lab 08/15/15 1639  TROPONINI 0.23*   BNP: BNP (last 3 results) No results for input(s): BNP in the last 8760 hours.  ProBNP (last 3 results) No results for input(s): PROBNP in the last 8760 hours.  CBG:  Recent Labs Lab 08/19/15 1605 08/19/15 2017 08/20/15 0016 08/20/15 0632 08/20/15 0725  GLUCAP 84 147* 154* 183* 164*       Signed:  Nita Sells MD   [facility] 08/20/2015, 9:14 AM

## 2015-08-20 NOTE — Consult Note (Signed)
WOC wound consult note Reason for Consult: asked to reassess patient.  Area on the right heel and right ankle.  These based on presentation have been present for some time, hard black eschar would not have not progressed in short time of admission.  [place] consulted 08/16/15 for sacrum and right elbow, penis.  Was not requested to evaluate heels.  Wound type: Unstageable Pressure Injury right heel; 4cm x 6cm x 0 Unstageable Pressure Injury right lateral malleolus: 1.5cm x 1.5cm x 0.2cm  Sacrum; has small full thickness opening at the distal edge of the scar tissue 0.1cm x 0.1cm x 0.1 Right elbow; unchanged  Pressure Ulcer POA: Yes Measurement:see above Wound bed: Right heel 98% black, stable, hard eschar Right lateral malleolus: 100% yellow slough Drainage (amount, consistency, odor) none from the heel/ankle Periwound: intact  Dressing procedure/placement/frequency:  1. Add enzymatic debridement ointment to the right lateral ankle wound to clean slough, apply and change daily. 2. Add Prevalon boots to offload heels, to be worn at all times when in the bed 3. Add calicum alginate to the right heel to dry up more/cover with foam. Change every 3 days. 4. Silicone foam to protect the sacrum, change every 3 days. 5. Silver hydrofiber to the right elbow, cover with foam, change every 3 days.  6. Needs to have right elbow off the bed on pillows all the time to prevent further injury.   Discussed POC with patient and bedside nurse.  Re consult if needed, will not follow at this time. Thanks  Idania Desouza [place], [place] [phone])

## 2015-08-21 DIAGNOSIS — I5022 Chronic systolic (congestive) heart failure: Secondary | ICD-10-CM

## 2015-08-21 LAB — BASIC METABOLIC PANEL
Anion gap: 7 (ref 5–15)
BUN: 27 mg/dL — AB (ref 6–20)
CALCIUM: 9.2 mg/dL (ref 8.9–10.3)
CO2: 29 mmol/L (ref 22–32)
CREATININE: 1.38 mg/dL — AB (ref 0.61–1.24)
Chloride: 109 mmol/L (ref 101–111)
GFR calc Af Amer: >60 mL/min (ref >60)
GFR, EST NON AFRICAN AMERICAN: 54 mL/min — AB (ref >60)
GLUCOSE: 196 mg/dL — AB (ref 65–99)
POTASSIUM: 3.1 mmol/L — AB (ref 3.5–5.1)
SODIUM: 145 mmol/L (ref 135–145)

## 2015-08-21 LAB — GLUCOSE, CAPILLARY
GLUCOSE-CAPILLARY: 165 mg/dL — AB (ref 65–99)
Glucose-Capillary: 176 mg/dL — ABNORMAL HIGH (ref 65–99)
Glucose-Capillary: 195 mg/dL — ABNORMAL HIGH (ref 65–99)

## 2015-08-21 MED ORDER — ACETAMINOPHEN 160 MG/5ML PO SOLN
650.0000 mg | Freq: Four times a day (QID) | ORAL | Status: DC | PRN
  Administered 2015-08-21: 650 mg
  Filled 2015-08-21: qty 20.3

## 2015-08-21 NOTE — Progress Notes (Signed)
Per facility representative Juliann Pulse, patient can arrive to facility at anytime. CSW informed RN and patient of transfer time. Patient was appreciative of CSW services.  Patient to be transported via [place] to [facility]. D/C Summary sent via Hub system. No further needs were requested at this time. CSW to sign off.   Please re-consult if further CSW needs arise.   Lucius Conn, [place] Worker [facility] Ph: [phone]

## 2015-08-21 NOTE — Progress Notes (Signed)
Nutrition Follow-up  DOCUMENTATION CODES:   Obesity unspecified  INTERVENTION:   Continue Osmolite 1.2 @ 60 ml/hr via PEG  Tube feeding regimen provides 2160 kcals (100% of needs), 90 grams of protein, and 1097 ml of H2O.   -Recommend resume previous TF regimen upon discharge back to SNF- Glucerna 1.5 @ 90 ml/hr from 1200-0500 (15 hours) daily, which provides 2025 kcals, 111 grams protein, and 1024 ml fluid daily (total 1824 ml fluid daily with 200 ml free water flush 4 times daily regimen). This meets 100% of estimated kcal and protein needs.   NUTRITION DIAGNOSIS:   Inadequate oral intake related to inability to eat as evidenced by NPO status.  Ongoing  GOAL:   Patient will meet greater than or equal to 90% of their needs  Met with TF  MONITOR:   Labs, Weight trends, TF tolerance, Skin, I & O's  REASON FOR ASSESSMENT:   Low [place], Consult Assessment of nutrition requirement/status  ASSESSMENT:   60 yo AAM with hx of renal transplant, FTT , NHP who was discharged from [facility]/23/15 with resp failure requiring intubation, AMS , bacteremia , e coli and klebsiella pneumonia, ICH with right hemiparesis,chronic diarrhea on feeds via j tube who returns 08/15/15 with AMS and Na of 165. He was briefly on nimvs but this has been stopped. He will be admitted to ICU and continued attempts to address code status will be continued.  Spoke with RN, who reports TF were resumed last night. Discharge was held yesterday secondary to hypoglycemic event. Plan to d/c back to [facility] today.   Pt is much more alert and interactive from last assessment, but still difficult to understand. Pt is currently receiving Osmolite 1.5 @ 60 ml/hr via PEG (confirmed goal rate with RN). Regimen provides 2160 kcals, 90 grams protein, and 1097 ml fluid daily (pt also receiving additional 200 ml free water flush 6 times daily to provide additional 1200 ml fluid). Per RN, pt tolerating TF well.    Reviewed [facility] note from 08/20/15 and case discussed with [facility]; pt with unstageable wounds to rt heel and rt lateral malleous (now with prevalon boots) and stage III to sacrum and rt ischial tuberosity.   Labs reviewed: K: 3.1, CBGS: 83-176.   Diet Order:  Diet NPO time specified Diet - low sodium heart healthy  Skin:  Wound (see comment) (UN rt heel rt and rt lateral malleus, stage III sacrum rt is)  Last BM:  08/20/15  Height:   Ht Readings from Last 1 Encounters:  08/15/15 _0  (1.626 m)    Weight:   Wt Readings from Last 1 Encounters:  08/21/15 189 lb 13.1 oz (86.1 kg)    Ideal Body Weight:  59.1 kg  BMI:  Body mass index is 32.57 kg/(m^2).  Estimated Nutritional Needs:   Kcal:  2000-2200  Protein:  100-115 grams  Fluid:  2.0-2.2 L  EDUCATION NEEDS:   No education needs identified at this time  Ivah Girardot A. Jimmye Norman, RD, LDN, CDE Pager: [phone] After hours Pager: [phone]

## 2015-08-21 NOTE — Progress Notes (Signed)
Justin [name] to be D/C'd back to [name] per MD order. Discussed with the patient and all questions fully answered.  VSS. Patient has multiple wounds (see flowsheet), dressing changes complete prior to discharge and all meds up to date. PEG tube clamped for transport.  SNF packet prepared by Education officer, museum and sent with patient to facility. Report called to [facility].  Patient to be escorted via stretcher, and D/C to [place] via EMS transport.

## 2015-08-21 NOTE — Discharge Summary (Signed)
Updated d/c summary 08/21/15 See AVS for changes   Physician Discharge Summary  Justin [name] [id number] DOB: 09-20-1955 DOA: 08/15/2015  PCP: Garwin Brothers, MD  Admit date: 08/15/2015 Discharge date: 08/21/2015  Time spent:  35 minutes  Recommendations for Outpatient Follow-up:   1.  recommend continuation of Acyclovir for suppressions of extensive skin lesions-would Rx for 14 more days and will need review as an OP to place back on suppressive therapy once active lesions have diminished. 2. Recommend tacrolimus level as an outpatient 3. Patient may resume tacrolimus and prednisone at prior to admission doses-was on stress dose steroids during hospital stay given the fact that the patient has had a  post cadaveric renal transplant 4. Recommend keeping groin area dry as moisture may cause bacterial proliferation 5.  Patient should have wounds on his bottom as well as sacrum debrided with Santyl and should be reviewed on a weekly/biweekly basis by primary physician 6. Patient should have lab work with basic metabolic panel + CBC in about one week 7. Continue free water 200 cc every 4 hours and graduate to goal feed of Osmolyte 8. Patient completed a 5 day course of IV Ceftaz andvVancomycin and it was felt superinfection of the areas was not present and these were discontinued promptly 9.  wound care instructions below 10. Insulins instructions q4 checks based on blood sugars as per below  Discharge Diagnoses:  Active Problems:   Essential hypertension   Intracranial hemorrhage (HCC)   Renal transplant, status post   Chronic systolic CHF (congestive heart failure) ([place])   Sacral decubitus ulcer   Seizures (HCC)   Small B-cell lymphoma (HCC)   Respiratory failure (HCC)   Acute hypernatremia   Discharge Condition: fair to gaurded  Diet recommendation: HH low salt diabetic  Wound care recommendations Dressing procedure/placement/frequency:  1. Add enzymatic debridement  ointment to the right lateral ankle wound to clean slough, apply and change daily. 2. Add Prevalon boots to offload heels, to be worn at all times when in the bed 3. Add calicum alginate to the right heel to dry up more/cover with foam. Change every 3 days. 4. Silicone foam to protect the sacrum, change every 3 days. 5. Silver hydrofiber to the right elbow, cover with foam, change every 3 days.  6. Needs to have right elbow off the bed on pillows all the time to prevent further injury.   Filed Weights   08/18/15 0409 08/20/15 0635 08/21/15 0540  Weight: 80.3 kg (177 lb 0.5 oz) 84.1 kg (185 lb 6.5 oz) 86.1 kg (189 lb 13.1 oz)    History of present illness:  60 year old male Hypertension Peripheral vascular disease with DVT status post IVC filter Small B-cell lymphoma secondary to chronic immunosuppression following cadaveric transplant status post radiation Not a candidate for chemotherapy Prior history intracranial hemorrhage as well as cognitive deficit sets. Decubitus ulcers on scrotum and lower extremities presented to emergency room 08/15/15 from nursing home  Initially unresponsive, sodium 165 requiring high flow oxygen stabilized in ICU and treated for cellulitis and transferred to the floor   Hospital Course:   Acute Respiratory failure with hypoxia ([place]): Secondary to Initial obtundation from hypernatremia and cellulitis  Acute hypernatremia: slowly improving, continue D5W--cut back rate from 75-50 cc per hour and discontinue in 12 hours At nursing facility patient would benefit from 200 cc of free water given via PEG tube every 4 hours Feeds re-started on 2/16 from trickle to a goal of 60.  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 0-150 (0 units) 151-200 (2 units) 201-250 (4 units) 251-300 (6 units) 301-350 (8 units) 400 (10 units)  Hypokalemia: Labs yesterday noted hyperkalemia, but the specimens were hemolyzed  Repeat labs show potassium 3.4 up from 2.8  Metabolic encephalopathy: In the setting of previous intracranial hemorrhage and brain radiation: Improving, Patient is a DO NOT RESUSCITATE No family available  penile cellulitis: Continue antibiotics.  Patient appears stable. No signs of sepsis. White count normalized. No fever Discontinued broad-spectrum antibiotics 2/15 in no white count  Continue however acyclovir for 14 days  Stage I sacral decubitus Right heel 5 x 4 cm Right heel 2 x 2 malleolus Right elbow 2 and half by 2 cm decubitus   Consultations:  Wound care  Discharge Exam: Filed Vitals:   08/20/15 2052 08/21/15 0540  BP: 139/84 108/51  Pulse: 65 98  Temp: 97.6 F (36.4 C) 99 F (37.2 C)  Resp: 15 16    General: alert  Babbling with ecgholalia Cardiovascular: s1 s 2no m/r/g Respiratory: clear no added sound.  Multiple wounds noted as per above  Discharge Instructions   Discharge Instructions    Diet - low sodium heart healthy    Complete by:  As directed      Increase activity slowly    Complete by:  As directed           Current Discharge  Medication List    START taking these medications   Details  mupirocin ointment (BACTROBAN) 2 % Place 1 application into the nose 2 (two) times daily. Qty: 22 g, Refills: 0    !! Water For Irrigation, Sterile (FREE WATER) SOLN Place 200 mLs into feeding tube every 4 (four) hours.     !! - Potential duplicate medications found. Please discuss with provider.    CONTINUE these medications which have NOT CHANGED   Details  acyclovir (ZOVIRAX) 200 MG/5ML suspension Take 10 mLs (400 mg total) by mouth 2 (two) times daily. Qty: 120 mL, Refills: 0    aspirin 81 MG tablet Place 81 mg into feeding tube daily.     atorvastatin (LIPITOR) 10 MG tablet Place 10 mg into feeding tube daily.     cholecalciferol (VITAMIN D) 1000 units tablet 1,000 Units by Gastric Tube route daily.    citalopram (CELEXA) 10 MG tablet Place 20 mg into feeding tube daily.     Cranberry 425 MG CAPS Place 425 mg into feeding tube daily.     gabapentin (NEURONTIN) 250 MG/5ML solution Place 2 mLs (100 mg total) into feeding tube every 8 (eight) hours. Qty: 90 mL, Refills: 0    hydrALAZINE (APRESOLINE) 25 MG tablet 25 mg  by PEG Tube route 2 (two) times daily.     ipratropium-albuterol (DUONEB) 0.5-2.5 (3) MG/3ML SOLN Take 3 mLs by nebulization 4 (four) times daily.    lamoTRIgine (LAMICTAL) 25 MG tablet Take 50 mg by mouth daily.    levETIRAcetam (KEPPRA) 100 MG/ML solution Place 5 mLs (500 mg total) into feeding tube 2 (two) times daily. Qty: 473 mL, Refills: 12    loperamide (IMODIUM) 1 MG/5ML solution Place 10 mLs (2 mg total) into feeding tube 4 (four) times daily. Qty: 120 mL, Refills: 0    Multiple Vitamins-Minerals (MULTIVITAMIN WITH MINERALS) tablet Take 1 tablet by mouth daily.    Nutritional Supplements (FEEDING SUPPLEMENT, GLUCERNA 1.5 CAL,) LIQD Place 90 mL/hr into feeding tube See admin instructions. 29ml/hr from 2p to 5a    Omega 3-6-9 Fatty Acids (OMEGA-3 FUSION) LIQD Take 15 mLs by mouth 2 (two)  times daily.    ondansetron (ZOFRAN) 4 MG tablet Take 4 mg by mouth every 8 (eight) hours as needed for nausea or vomiting.    predniSONE (DELTASONE) 5 MG tablet Place 5 mg into feeding tube daily.     Probiotic Product (PROBIOTIC PO) Take 1 capsule by mouth 2 (two) times daily.    promethazine (PHENERGAN) 25 MG/ML injection Inject 25 mg into the vein every 6 (six) hours as needed for nausea or vomiting.    SANTYL ointment Apply 1 application topically 2 (two) times daily.    sucralfate (CARAFATE) 1 GM/10ML suspension Place 1 g into feeding tube every 8 (eight) hours.     !! tacrolimus (PROGRAF) 0.5 MG capsule Take 0.5 mg by mouth 2 (two) times daily.    !! tacrolimus (PROGRAF) 1 MG capsule 3 mg by PEG Tube route 2 (two) times daily.     vancomycin (VANCOCIN) 750 MG SOLR injection Inject 750 mg into the vein every other day.    !! Water For Irrigation, Sterile (FREE WATER) SOLN Place 200 mLs into feeding tube 4 (four) times daily.     !! - Potential duplicate medications found. Please discuss with provider.    STOP taking these medications     Dextromethorphan-Quinidine 20-10 MG CAPS      insulin lispro (HUMALOG KWIKPEN) 100 UNIT/ML KiwkPen      insulin NPH Human (HUMULIN N,NOVOLIN N) 100 UNIT/ML injection      nystatin (MYCOSTATIN) 100000 UNIT/ML suspension        Allergies  Allergen Reactions  . Amoxicillin Other (See Comments)    ON MAR  . Ampicillin Other (See Comments)    ON MAR  . Latex Other (See Comments)    ON MAR  . Morphine And Related Other (See Comments)    UNK  . Penicillins Other (See Comments)    ON MAR; tolerated Ceftriaxone 8/27-9/1  . Tape Other (See Comments)    ON MAR      The results of significant diagnostics from this hospitalization (including imaging, microbiology, ancillary and laboratory) are listed below for reference.    Significant Diagnostic Studies: Ct Head Wo Contrast  08/15/2015  CLINICAL DATA:  Altered mental status.   History of stroke EXAM: CT HEAD WITHOUT CONTRAST TECHNIQUE: Contiguous axial images were obtained from the base of the skull through the vertex without intravenous contrast. COMPARISON:  06/08/2015 FINDINGS: Skull and Sinuses:Frontal scalp fat infiltration is chronic and consistent with scar. There is diffuse chronic subcutaneous of the scalp. There is a remote left frontal burr hole. No acute fracture. Patchy left ethmoid opacification with clear nasopharynx.  No acute sinusitis. Visualized orbits: Negative. Brain: Remote left MCA territory infarct with dense gliosis in the upper division territory and subcortical nuclei. Chronic small-vessel disease with ischemic gliosis around the lateral ventricles. Advanced atrophy of the pons, even when accounting for wallerian degeneration. Cerebellum is likewise symmetrically atrophic. No evidence of acute hemorrhage, hydrocephalus, or acute infarct. IMPRESSION: 1. No acute finding or change from 2016. 2. Remote left MCA territory infarct. 3. Atrophy, severe at the level of the brainstem. Electronically Signed   By: Monte Fantasia M.D.   On: 08/15/2015 14:35   Dg Chest Portable 1 View  08/15/2015  CLINICAL DATA:  60 year old male with altered mental status. Question right arm PICC line. EXAM: PORTABLE CHEST 1 VIEW COMPARISON:  06/14/2015 and prior radiographs FINDINGS: This is a low volume film. Left basilar atelectasis is present. The cardiomediastinal silhouette is unchanged. There is no evidence of pneumothorax or definite pleural effusion. An endotracheal tube and NG tube is been removed. There is no evidence of right central venous catheter or PICC line on this study. IMPRESSION: Low volume film with left basilar atelectasis. No evidence of right PICC line or central venous catheter on this study. Electronically Signed   By: Margarette Canada M.D.   On: 08/15/2015 12:21    Microbiology: Recent Results (from the past 240 hour(s))  Urine culture     Status: None    Collection Time: 08/15/15 12:17 PM  Result Value Ref Range Status   Specimen Description URINE, CATHETERIZED  Final   Special Requests NONE  Final   Culture 10,000 COLONIES/mL ESCHERICHIA COLI  Final   Report Status 08/17/2015 FINAL  Final   Organism ID, Bacteria ESCHERICHIA COLI  Final      Susceptibility   Escherichia coli - MIC*    AMPICILLIN >=32 RESISTANT Resistant     CEFAZOLIN <=4 SENSITIVE Sensitive     CEFTRIAXONE <=1 SENSITIVE Sensitive     CIPROFLOXACIN >=4 RESISTANT Resistant     GENTAMICIN >=16 RESISTANT Resistant     IMIPENEM <=0.25 SENSITIVE Sensitive     NITROFURANTOIN <=16 SENSITIVE Sensitive     TRIMETH/SULFA <=20 SENSITIVE Sensitive     AMPICILLIN/SULBACTAM 16 INTERMEDIATE Intermediate     PIP/TAZO <=4 SENSITIVE Sensitive     * 10,000 COLONIES/mL ESCHERICHIA COLI  Blood Culture (routine x 2)     Status: None   Collection Time: 08/15/15 12:39 PM  Result Value Ref Range Status   Specimen Description BLOOD RIGHT HAND  Final   Special Requests BOTTLES DRAWN AEROBIC AND ANAEROBIC 3CC  Final   Culture NO GROWTH 5 DAYS  Final   Report Status 08/20/2015 FINAL  Final  Blood Culture (routine x 2)     Status: None   Collection Time: 08/15/15  4:40 PM  Result Value Ref Range Status   Specimen Description BLOOD RIGHT HAND  Final   Special Requests PEDIATRICS 4CC  Final   Culture NO GROWTH 5 DAYS  Final   Report Status 08/20/2015 FINAL  Final  MRSA PCR Screening     Status: Abnormal   Collection Time: 08/15/15  5:55 PM  Result Value Ref Range Status   MRSA by PCR POSITIVE (A) NEGATIVE Final    Comment:        The GeneXpert MRSA Assay (FDA approved for NASAL specimens only), is one component of a comprehensive MRSA colonization surveillance program. It is not intended to diagnose MRSA infection nor to guide or monitor treatment for MRSA infections. RESULT CALLED TO, READ  BACK BY AND VERIFIED WITH: SMITH,V RN 08/15/15 1956 Geraldine      Labs: Basic Metabolic  Panel:  Recent Labs Lab 08/15/15 1639  08/18/15 0835 08/18/15 1215 08/18/15 1550 08/19/15 0500 08/20/15 0515 08/21/15 0739  NA  --   < > 153* 153*  --  146* 140 145  K  --   < > 3.3* 2.8*  --  3.4* 3.2* 3.1*  CL  --   < > 112* 114*  --  110 105 109  CO2  --   < > 30 30  --  29 29 29   GLUCOSE  --   < > 78 73  --  266* 356* 196*  BUN  --   < > 53* 52*  --  47* 33* 27*  CREATININE  --   < > 1.60* 1.55*  --  1.60* 1.38* 1.38*  CALCIUM  --   < > 9.9 9.8  --  9.3 9.1 9.2  MG 3.0*  --   --   --  2.6*  --   --   --   PHOS 3.1  --   --   --   --   --   --   --   < > = values in this interval not displayed. Liver Function Tests:  Recent Labs Lab 08/15/15 1239  AST 50*  ALT 30  ALKPHOS 67  BILITOT 1.0  PROT 7.9  ALBUMIN 2.3*    Recent Labs Lab 08/15/15 1639  LIPASE 29  AMYLASE 43   No results for input(s): AMMONIA in the last 168 hours. CBC:  Recent Labs Lab 08/15/15 1239 08/15/15 1402 08/15/15 1639 08/16/15 0513 08/18/15 0527  WBC 8.0  --  7.4 6.7 4.2  NEUTROABS 5.6  --   --   --   --   HGB 14.5 14.6 13.0 11.2* 10.9*  HCT 52.9* 43.0 48.3 42.7 38.2*  MCV 96.2  --  96.4 95.7 89.3  PLT 147*  --  148* 163 84*   Cardiac Enzymes:  Recent Labs Lab 08/15/15 1639  TROPONINI 0.23*   BNP: BNP (last 3 results) No results for input(s): BNP in the last 8760 hours.  ProBNP (last 3 results) No results for input(s): PROBNP in the last 8760 hours.  CBG:  Recent Labs Lab 08/20/15 1657 08/20/15 2019 08/20/15 2314 08/21/15 0313 08/21/15 0732  GLUCAP 95 83 111* 165* 176*       Signed:  Nita Sells MD   [facility] 08/21/2015, 9:26 AM

## 2015-10-03 DEATH — deceased

## 2017-04-27 IMAGING — XA IR PERC PLACEMENT GASTROSTOMY
1 series · 12 of 15 positions shown · non-contrast
Comparison: CT abdomen and pelvis -03/03/2015

INDICATION: History of intracranial hemorrhage admitted with UTI and sepsis.
Patient has history of prior gastrostomy tube placement several
years ago. Patient currently failed a swallow study and as such
request made for placement of a new percutaneous gastrostomy tube
placement.

EXAM:
PULL TROUGH GASTROSTOMY TUBE PLACEMENT

[Series 1: run · 12 of 15 slices shown]
[im 1/15]
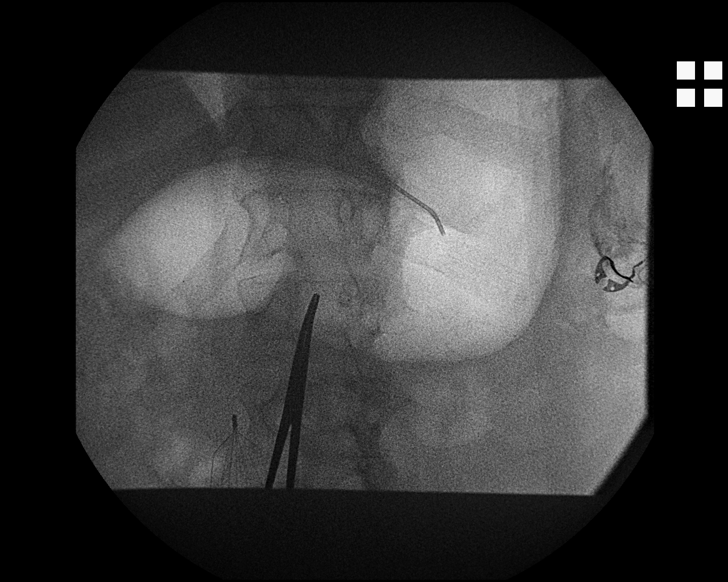
[im 2/15]
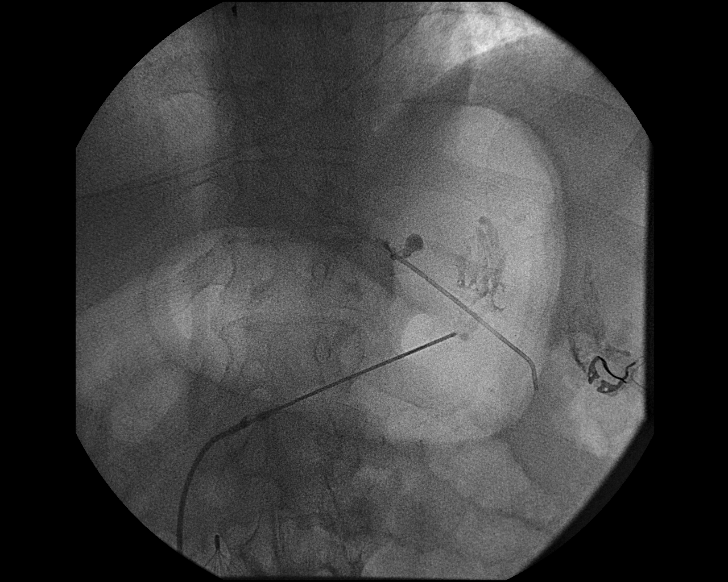
[im 4/15]
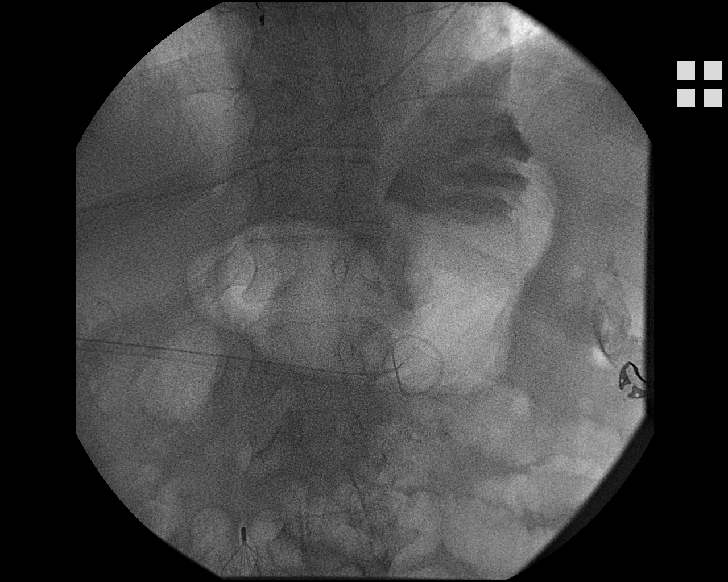
[im 5/15]
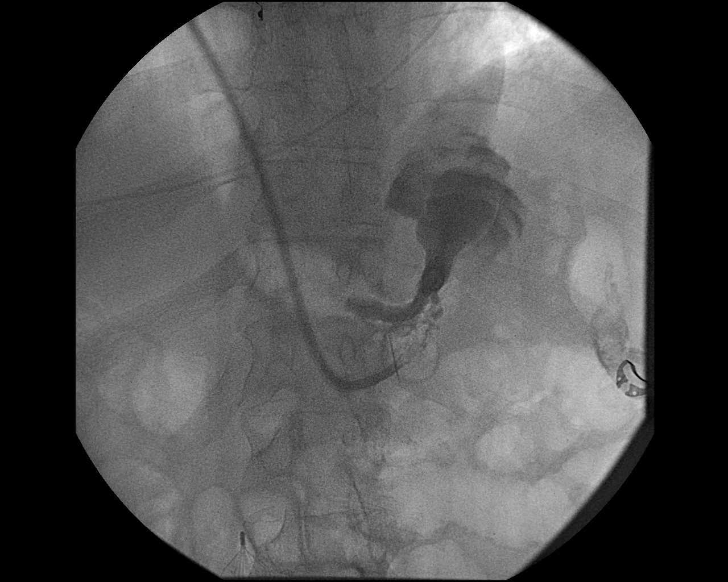
[im 6/15]
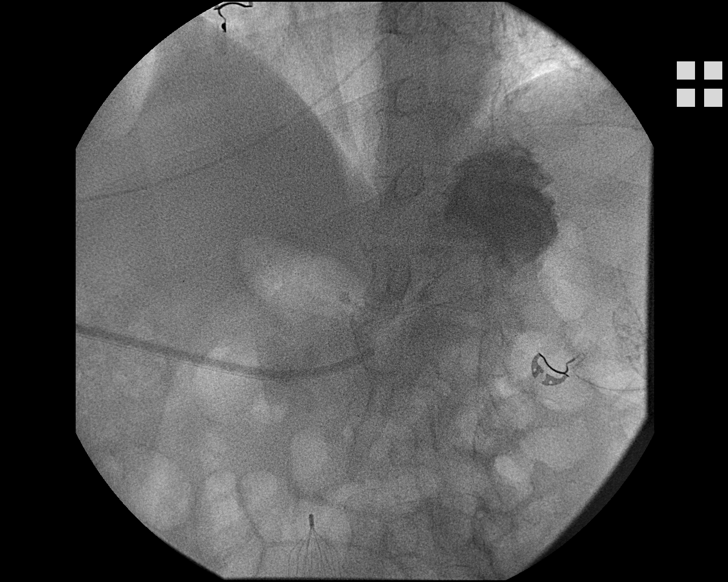
[im 7/15]
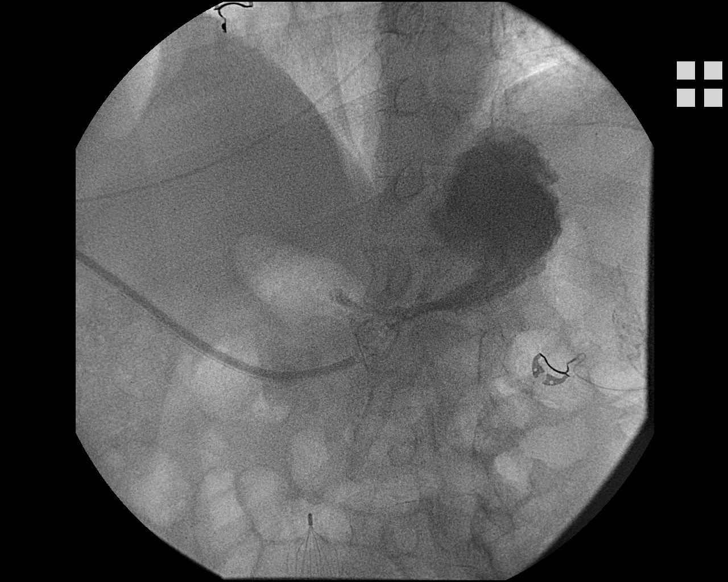
[im 9/15]
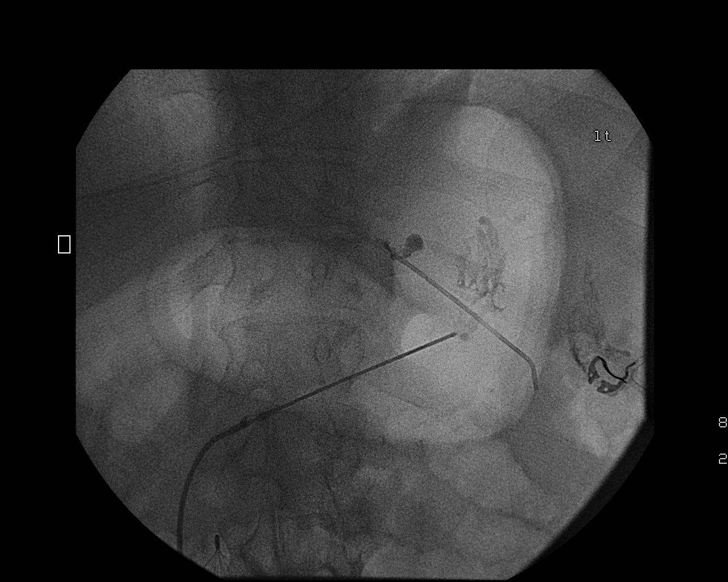
[im 10/15]
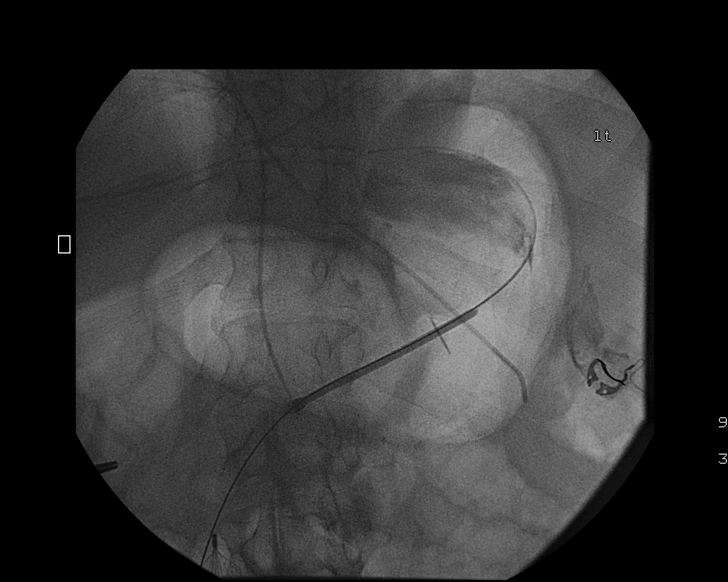
[im 11/15]
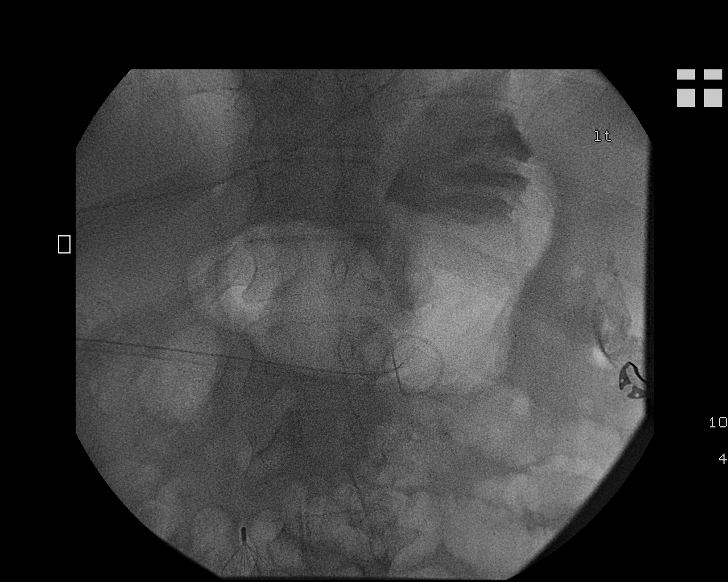
[im 12/15]
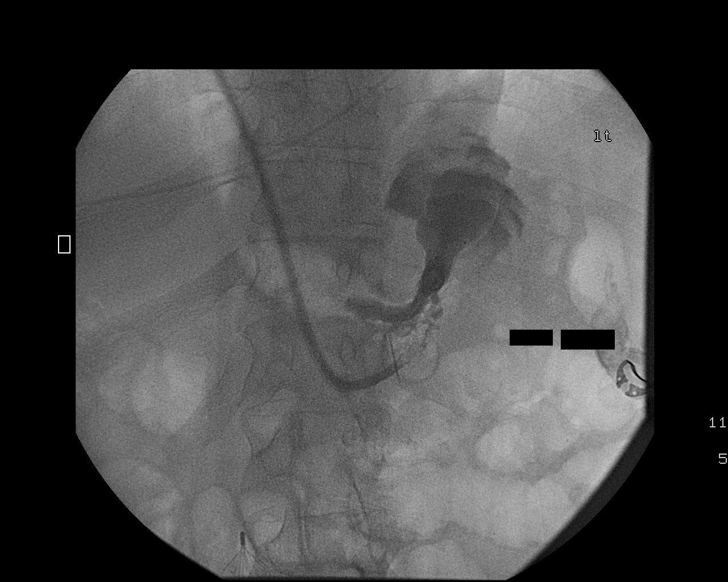
[im 14/15]
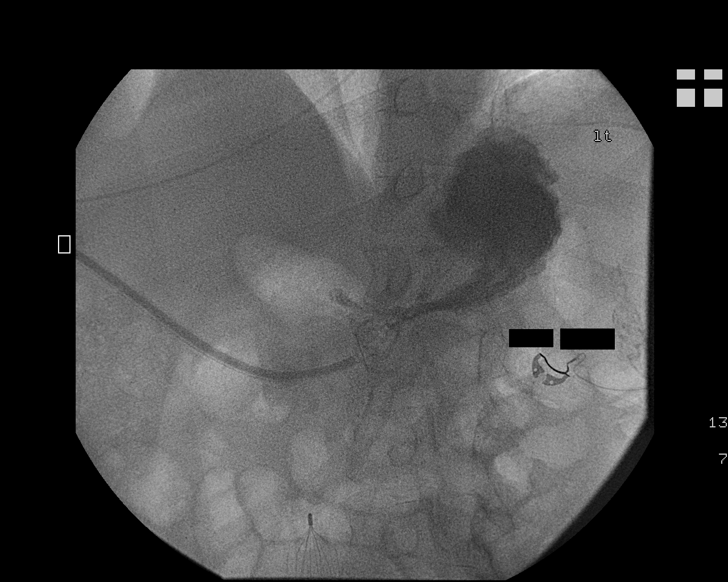
[im 15/15]
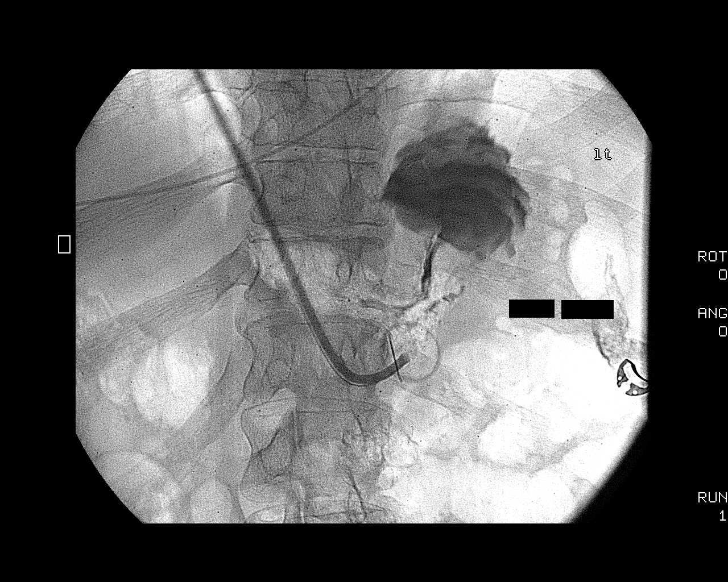

[12 of 15 positions shown; findings below may reference images not displayed]

MEDICATIONS:
Vancomycin 1 g IV; Antibiotics were administered within 1 hour of
the procedure.

Glucagon 1 mg IV

CONTRAST:  20 mL of Omnipaque 300 administered into the gastric
lumen.

ANESTHESIA/SEDATION:
Versed 1.5 mg IV; Fentanyl 275 mcg IV

Sedation time

15 minutes

FLUOROSCOPY TIME:  2 minutes 36 seconds

COMPLICATIONS:
None immediate

PROCEDURE:
Informed written consent was obtained from the patient's family
following explanation of the procedure, risks, benefits and
alternatives. A time out was performed prior to the initiation of
the procedure. Ultrasound scanning was performed to demarcate the
edge of the left lobe of the liver. Maximal barrier sterile
technique utilized including caps, mask, sterile gowns, sterile
gloves, large sterile drape, hand hygiene and Betadine prep.

The left upper quadrant was sterilely prepped and draped. An oral
gastric catheter was inserted into the stomach under fluoroscopy.
The existing nasogastric feeding tube was removed. The left costal
margin and air opacified transverse colon were identified and
avoided. Air was injected into the stomach for insufflation and
visualization under fluoroscopy. Under sterile conditions a 17 gauge
trocar needle was utilized to access the stomach percutaneously
beneath the left subcostal margin just lateral to the site of the
prior gastrostomy tube track after the overlying soft tissues were
anesthetized with 1% Lidocaine with epinephrine. Needle position was
confirmed within the stomach with aspiration of air and injection of
small amount of contrast. A single T tack was deployed for
gastropexy. Over an Amplatz guide wire, a 9-French sheath was
inserted into the stomach. A snare device was utilized to capture
the oral gastric catheter. The snare device was pulled retrograde
from the stomach up the esophagus and out the oropharynx. The
20-French pull-through gastrostomy was connected to the snare device
and pulled antegrade through the oropharynx down the esophagus into
the stomach and then through the percutaneous tract external to the
patient. The gastrostomy was assembled externally. Contrast
injection confirms position in the stomach. Several spot
radiographic images were obtained in various obliquities for
documentation. The patient tolerated procedure well without
immediate post procedural complication.
FINDINGS: After successful fluoroscopic guided placement, the gastrostomy tube
is appropriately positioned with internal disc against the ventral
aspect of the gastric lumen.
IMPRESSION: Successful fluoroscopic insertion of a 20-French pull-through
gastrostomy tube.

The gastrostomy may be used immediately for medication
administration and in 24 hrs for the initiation of feeds.
